# Patient Record
Sex: Female | Born: 1955 | Race: Black or African American | Hispanic: No | Marital: Single | Smoking: Current some day smoker
Health system: Southern US, Community
[De-identification: ages and names within clinical notes are randomized; demographics above are authoritative.]

## PROBLEM LIST (undated history)

## (undated) DIAGNOSIS — Z86010 Personal history of colonic polyps: Secondary | ICD-10-CM

## (undated) DIAGNOSIS — Z9981 Dependence on supplemental oxygen: Secondary | ICD-10-CM

## (undated) DIAGNOSIS — T8859XA Other complications of anesthesia, initial encounter: Secondary | ICD-10-CM

## (undated) DIAGNOSIS — T4145XA Adverse effect of unspecified anesthetic, initial encounter: Secondary | ICD-10-CM

## (undated) DIAGNOSIS — K429 Umbilical hernia without obstruction or gangrene: Secondary | ICD-10-CM

## (undated) DIAGNOSIS — K219 Gastro-esophageal reflux disease without esophagitis: Secondary | ICD-10-CM

## (undated) DIAGNOSIS — E119 Type 2 diabetes mellitus without complications: Secondary | ICD-10-CM

## (undated) DIAGNOSIS — K432 Incisional hernia without obstruction or gangrene: Secondary | ICD-10-CM

## (undated) DIAGNOSIS — J40 Bronchitis, not specified as acute or chronic: Secondary | ICD-10-CM

## (undated) DIAGNOSIS — J849 Interstitial pulmonary disease, unspecified: Secondary | ICD-10-CM

## (undated) DIAGNOSIS — I509 Heart failure, unspecified: Secondary | ICD-10-CM

## (undated) DIAGNOSIS — Z8616 Personal history of COVID-19: Secondary | ICD-10-CM

## (undated) DIAGNOSIS — G4733 Obstructive sleep apnea (adult) (pediatric): Secondary | ICD-10-CM

## (undated) DIAGNOSIS — M2021 Hallux rigidus, right foot: Secondary | ICD-10-CM

## (undated) DIAGNOSIS — J984 Other disorders of lung: Secondary | ICD-10-CM

## (undated) DIAGNOSIS — D649 Anemia, unspecified: Secondary | ICD-10-CM

## (undated) DIAGNOSIS — M1990 Unspecified osteoarthritis, unspecified site: Secondary | ICD-10-CM

## (undated) DIAGNOSIS — F419 Anxiety disorder, unspecified: Secondary | ICD-10-CM

## (undated) HISTORY — DX: Incisional hernia without obstruction or gangrene: K43.2

## (undated) HISTORY — PX: BREAST SURGERY: SHX581

## (undated) HISTORY — PX: HERNIA REPAIR: SHX51

## (undated) HISTORY — PX: FINGER SURGERY: SHX640

## (undated) HISTORY — PX: OTHER SURGICAL HISTORY: SHX169

## (undated) HISTORY — PX: COLON SURGERY: SHX602

## (undated) HISTORY — PX: ABDOMINAL HYSTERECTOMY: SHX81

## (undated) HISTORY — PX: SHOULDER ARTHROSCOPY: SHX128

## (undated) HISTORY — DX: Personal history of colonic polyps: Z86.010

## (undated) HISTORY — DX: Obstructive sleep apnea (adult) (pediatric): G47.33

## (undated) HISTORY — PX: PARTIAL COLECTOMY: SHX5273

## (undated) NOTE — *Deleted (*Deleted)
NAME:  Tiffany Coleman, MRN:  [id number], DOB:  06-05-1956, LOS: 0 ADMISSION DATE:  05/26/2020, CONSULTATION DATE: 10/14 REFERRING MD: Dr. Grandville Silos, CHIEF COMPLAINT:  SOB    Brief History   ***  History of present illness   69 y/o F admitted 10/13 with reports of shortness of breath.   Past Medical History  OSA  Anemia  Arthritis   Significant Hospital Events   ***  Consults:  ***  Procedures:  ***  Significant Diagnostic Tests:  ***  Micro Data:  ***  Antimicrobials:  ***   Interim history/subjective:  ***  Objective   Blood pressure 138/83, pulse (!) 104, temperature 98.1 F (36.7 C), resp. rate 15, height 5' 3.5" (1.613 m), weight 95.3 kg, SpO2 95 %.        Intake/Output Summary (Last 24 hours) at 05/27/2020 1612 Last data filed at 05/27/2020 1779 Gross per 24 hour  Intake -  Output 400 ml  Net -400 ml   Filed Weights   05/26/20 1315  Weight: 95.3 kg    Examination: General:   HEENT: MM pink/moist Neuro:  CV: s1s2 ***, no m/r/g PULM:  *** GI: soft, bsx4 active  Extremities: warm/dry, *** edema  Skin: no rashes or lesions  Resolved Hospital Problem list     Assessment & Plan:     Best practice:  Diet: *** Pain/Anxiety/Delirium protocol (if indicated): *** VAP protocol (if indicated): *** DVT prophylaxis: *** GI prophylaxis: *** Glucose control: *** Mobility: *** Code Status: *** Family Communication: *** Disposition:   Labs   CBC: Recent Labs  Lab 05/26/20 1319 05/27/20 0433  WBC 10.1 8.9  NEUTROABS 7.2  --   HGB 11.0* 10.4*  HCT 35.4* 33.9*  MCV 89.8 90.4  PLT 192 390    Basic Metabolic Panel: Recent Labs  Lab 05/26/20 1319 05/27/20 0433  NA 141 139  K 3.9 4.3  CL 99 102  CO2 30 29  GLUCOSE 119* 111*  BUN 11 14  CREATININE 0.81 0.72  CALCIUM 9.9 9.4   GFR: Estimated Creatinine Clearance: 76.8 mL/min (by C-G formula based on SCr of 0.72 mg/dL). Recent Labs  Lab 05/26/20 1319 05/27/20 0433   WBC 10.1 8.9    Liver Function Tests: No results for input(s): AST, ALT, ALKPHOS, BILITOT, PROT, ALBUMIN in the last 168 hours. No results for input(s): LIPASE, AMYLASE in the last 168 hours. No results for input(s): AMMONIA in the last 168 hours.  ABG    Component Value Date/Time   HCO3 26.4 04/05/2020 1443   TCO2 30 10/12/2016 2114   O2SAT 38.5 04/05/2020 1443     Coagulation Profile: No results for input(s): INR, PROTIME in the last 168 hours.  Cardiac Enzymes: No results for input(s): CKTOTAL, CKMB, CKMBINDEX, TROPONINI in the last 168 hours.  HbA1C: Hgb A1c MFr Bld  Date/Time Value Ref Range Status  04/25/2020 06:21 AM 6.7 (H) 4.8 - 5.6 % Final    Comment:    (NOTE) Pre diabetes:          5.7%-6.4%  Diabetes:              >6.4%  Glycemic control for   <7.0% adults with diabetes   04/25/2019 11:31 AM 5.5 4.8 - 5.6 % Final    Comment:             Prediabetes: 5.7 - 6.4          Diabetes: >6.4  Glycemic control for adults with diabetes: <7.0     CBG: No results for input(s): GLUCAP in the last 168 hours.  Review of Systems: Positives in [place]   Gen: Denies fever, chills, weight change, fatigue, night sweats HEENT: Denies blurred vision, double vision, hearing loss, tinnitus, sinus congestion, rhinorrhea, sore throat, neck stiffness, dysphagia PULM: Denies shortness of breath, cough, sputum production, hemoptysis, wheezing CV: Denies chest pain, edema, orthopnea, paroxysmal nocturnal dyspnea, palpitations GI: Denies abdominal pain, nausea, vomiting, diarrhea, hematochezia, melena, constipation, change in bowel habits GU: Denies dysuria, hematuria, polyuria, oliguria, urethral discharge Endocrine: Denies hot or cold intolerance, polyuria, polyphagia or appetite change Derm: Denies rash, dry skin, scaling or peeling skin change Heme: Denies easy bruising, bleeding, bleeding gums Neuro: Denies headache, numbness, weakness, slurred speech, loss of memory  or consciousness   Past Medical History  She,  has a past medical history of Anemia (yrs ago), Arthritis, Bronchitis, Complication of anesthesia, colonic polyps (2015), Obstructive sleep apnea (06/02/2016), Recurrent ventral hernia (5/32/9924), and Umbilical hernia.   Surgical History    Past Surgical History:  Procedure Laterality Date  . ABDOMINAL HYSTERECTOMY     partial  . BREAST SURGERY Bilateral    cyst removed  . COLON SURGERY    . COLONOSCOPY WITH PROPOFOL N/A 05/29/2017   Procedure: COLONOSCOPY WITH PROPOFOL;  Surgeon: Wilford Corner, MD;  Location: [facility];  Service: Endoscopy;  Laterality: N/A;  . FINGER SURGERY     right pinkie x 3  . HERNIA REPAIR    . KNEE SURGERY Right 2017  . PARTIAL COLECTOMY Left ~2004   for obstruction  . right foot surgery  yrs ago  . stomach tumur     [id number] benign     Social History   reports that she has been smoking cigarettes. She has smoked for the past 20.00 years. She has never used smokeless tobacco. She reports current alcohol use. She reports that she does not use drugs.   Family History   Her family history includes Cancer in her father and maternal uncle; Deep vein thrombosis in her mother; Diabetes in her mother; Heart attack in her son; Hyperlipidemia in her brother; Hypertension in her daughter, mother, and sister; Varicose Veins in her mother.   Allergies Allergies  Allergen Reactions  . Latex Hives and Itching  . Penicillins Hives, Itching and Other (See Comments)    Has patient had a PCN reaction causing immediate rash, facial/tongue/throat swelling, SOB or lightheadedness with hypotension: Yes Has patient had a PCN reaction causing severe rash involving mucus membranes or skin necrosis: Unk Has patient had a PCN reaction that required hospitalization: Unk Has patient had a PCN reaction occurring within the last 10 years: No If all of the above answers are "NO", then may proceed with Cephalosporin use.    . Tape Hives, Itching and Other (See Comments)    Coban wrap, in particular  . Codeine Itching     Home Medications  Prior to Admission medications   Medication Sig Start Date End Date Taking? Authorizing Provider  acetaminophen (TYLENOL) 325 MG tablet Take 2 tablets (650 mg total) by mouth every 6 (six) hours as needed for mild pain or headache (fever >/= 101). 05/11/20  Yes Arrien, Jimmy Picket, MD  albuterol (PROVENTIL HFA;VENTOLIN HFA) 108 (90 Base) MCG/ACT inhaler Inhale 2 puffs into the lungs every 6 (six) hours as needed for wheezing or shortness of breath. 01/19/17  Yes Minus Liberty, MD  gabapentin (NEURONTIN) 300 MG capsule Take  300 mg by mouth 3 (three) times daily as needed (nerve pain).  07/17/19  Yes [provider]  guaiFENesin-dextromethorphan (ROBITUSSIN DM) 100-10 MG/5ML syrup Take 5 mLs by mouth every 6 (six) hours as needed for cough. 05/11/20  Yes Arrien, Jimmy Picket, MD  Nutritional Supplements (FEEDING SUPPLEMENT, KATE FARMS STANDARD 1.4,) LIQD liquid Take 325 mLs by mouth daily. 05/11/20 06/10/20 Yes Arrien, Jimmy Picket, MD  predniSONE (DELTASONE) 10 MG tablet Take 2 tablets daily for one week, then take 1 tablet daily for one week, then take half tablet daily for one week. 05/11/20  Yes Arrien, Jimmy Picket, MD  zolpidem (AMBIEN) 10 MG tablet Take 10 mg by mouth at bedtime as needed for sleep.  07/18/19  Yes [provider]  albuterol (PROVENTIL) (2.5 MG/3ML) 0.083% nebulizer solution Take 3 mLs (2.5 mg total) by nebulization every 6 (six) hours as needed for wheezing or shortness of breath. Patient not taking: Reported on 05/26/2020 02/08/19   Ok Edwards, PA-C  budesonide-formoterol [facility]) 160-4.5 MCG/ACT inhaler Inhale 2 puffs into the lungs 2 (two) times daily. Patient not taking: Reported on 05/26/2020    [provider]     Critical care time: ***    Noe Gens, MSN, NP-C, AGACNP-BC [facility]  05/27/2020, 4:12 PM   Please see [email or web address] for pager details.

---

## 2009-02-26 ENCOUNTER — Emergency Department (HOSPITAL_COMMUNITY): Admission: EM | Admit: 2009-02-26 | Discharge: 2009-02-26 | Payer: Self-pay | Admitting: Emergency Medicine

## 2010-07-22 ENCOUNTER — Encounter
Admission: RE | Admit: 2010-07-22 | Discharge: 2010-07-22 | Payer: Self-pay | Source: Home / Self Care | Attending: Surgery | Admitting: Surgery

## 2010-09-19 ENCOUNTER — Encounter (HOSPITAL_COMMUNITY): Payer: Medicaid Other | Attending: Surgery

## 2010-09-19 LAB — SURGICAL PCR SCREEN
MRSA, PCR: NEGATIVE
Staphylococcus aureus: POSITIVE — AB

## 2010-09-27 ENCOUNTER — Ambulatory Visit (HOSPITAL_COMMUNITY)
Admission: RE | Admit: 2010-09-27 | Discharge: 2010-09-27 | Disposition: A | Discharge status: Home / Self Care | Payer: Medicaid Other | Source: Ambulatory Visit | Attending: Surgery | Admitting: Surgery

## 2010-09-27 ENCOUNTER — Inpatient Hospital Stay (HOSPITAL_COMMUNITY)
Admission: RE | Admit: 2010-09-27 | Discharge: 2010-10-04 | DRG: 336 | Disposition: A | Discharge status: Home / Self Care | Payer: Medicaid Other | Attending: Surgery | Admitting: Surgery

## 2010-09-27 DIAGNOSIS — K430 Incisional hernia with obstruction, without gangrene: Principal | ICD-10-CM | POA: Diagnosis present

## 2010-09-27 DIAGNOSIS — E663 Overweight: Secondary | ICD-10-CM | POA: Diagnosis present

## 2010-09-27 DIAGNOSIS — K921 Melena: Secondary | ICD-10-CM | POA: Diagnosis not present

## 2010-09-27 DIAGNOSIS — K219 Gastro-esophageal reflux disease without esophagitis: Secondary | ICD-10-CM | POA: Diagnosis present

## 2010-09-27 DIAGNOSIS — K560 Paralytic ileus: Secondary | ICD-10-CM | POA: Diagnosis not present

## 2010-09-27 DIAGNOSIS — F17200 Nicotine dependence, unspecified, uncomplicated: Secondary | ICD-10-CM | POA: Diagnosis present

## 2010-09-27 LAB — POCT I-STAT, CHEM 8
BUN: 13 mg/dL (ref 6–23)
Calcium, Ion: 1.29 mmol/L (ref 1.12–1.32)
Hemoglobin: 13.6 g/dL (ref 12.0–15.0)
Sodium: 139 mEq/L (ref 135–145)
TCO2: 28 mmol/L (ref 0–100)

## 2010-09-28 LAB — CBC
MCH: 27 pg (ref 26.0–34.0)
MCHC: 31.6 g/dL (ref 30.0–36.0)
Platelets: 221 10*3/uL (ref 150–400)
RBC: 4.07 MIL/uL (ref 3.87–5.11)

## 2010-09-28 LAB — BASIC METABOLIC PANEL
CO2: 29 mEq/L (ref 19–32)
Calcium: 9.1 mg/dL (ref 8.4–10.5)
Creatinine, Ser: 0.83 mg/dL (ref 0.4–1.2)
GFR calc Af Amer: >60 mL/min (ref >60)
GFR calc non Af Amer: >60 mL/min (ref >60)

## 2010-09-28 NOTE — Op Note (Signed)
Tiffany Coleman, Tiffany Coleman           ACCOUNT NO.:  [id number]  MEDICAL RECORD NO.:  [id number]           PATIENT TYPE:  O  LOCATION:  XRAY                         FACILITY:  [facility]  PHYSICIAN:  Imogene Burn. Georgette Dover, M.D. DATE OF BIRTH:  1955/10/16  DATE OF PROCEDURE:  09/27/2010 DATE OF DISCHARGE:                              OPERATIVE REPORT   PREOPERATIVE DIAGNOSIS:  Recurrent ventral hernia.  POSTOPERATIVE DIAGNOSIS:  Recurrent ventral hernia.  PROCEDURE PERFORMED:  Exploratory laparotomy with extensive lysis of adhesions for greater than 90 minutes and ventral hernia repair with mesh.  SURGEON:  Imogene Burn. Georgette Dover, M.D.  ASSISTANT:  Orson Ape. Rise Patience, M.D.  ANESTHESIA:  General  INDICATIONS:  This is a 55 year old female with extensive past surgical history of which we have no available medical records.  She has had multiple ventral hernias repaired in the past.  She thinks that she has mesh in place but all of our attempts to try to obtain past operative reports have been unsuccessful.  She continues to smoke.  She is overweight.  She has developed a recurrent ventral hernia and has developed signs of obstruction intermittently.  A CT scan confirms that she has a large ventral hernia above her umbilicus with some incarcerated small bowel and colon.  She presents now for surgical repair.  DESCRIPTION OF PROCEDURE:  The patient was brought to the operating room, placed in the supine position on the operating table.  After an adequate level of general anesthesia was obtained, a Foley catheter was placed under sterile technique.  The patient's abdomen was prepped with ChloraPrep and draped in sterile fashion.  Time-out was taken to assure proper patient and proper procedure.  We opened her upper midline incision down to the umbilicus.  Dissection was carried down the subcutaneous tissues with cautery.  We dissected down to the hernia sac. We bluntly dissected into the hernia  sac.  We then began slowly peeling away some adhesions from the hernia sac.  As we continued dissecting this away bluntly, we were able to open the hernia sac wider.  We identified a fairly thin layer of fascia and began to open this as well. The entire hernia defect seems to be about 10 cm.  As we further opened the abdominal wall, there were a few other small Swiss cheese defects. There was some small bowel that was densely adherent to the abdominal wall in the left lower quadrant.  We took this down with scissors.  We then repaired the small serosal defect with some interrupted 3-0 silks. We continued dissecting for about 90 minutes and cleared the entire abdominal wall.  We excised a little bit of the fascia with the Swiss cheese defects.  The fascial edges seemed to come together with a only a moderate amount of tension.  We irrigated the abdomen thoroughly.  We then performed ventral hernia repair with a 20 x 25 cm sheet of Physio mesh.  We placed this in underlay position and suspended this from the abdominal wall with multiple #1 and 0 Novofil sutures.  These were all tied up to the fascia and the mesh was held in place.  There were no defects between the sutures.  We then reapproximated the fascia with figure-of-eight #1 Prolene sutures.  We then thoroughly irrigated the subcutaneous tissues.  A 19 Blake drain was placed and sutured with a 2-0 nylon.  The subcutaneous tissues were irrigated.  Staples were used to close the skin.  An occlusive dressing was applied.  The patient was then extubated and brought to recovery room in stable condition.  All sponge, instrument and needle counts were correct.     Imogene Burn. Georgette Dover, M.D.     MKT/MEDQ  D:  09/27/2010  T:  09/27/2010  Job:  [id number]  Electronically Signed by Donnie Mesa M.D. on 09/28/2010 08:16:52 AM

## 2010-09-30 LAB — BASIC METABOLIC PANEL
Chloride: 105 mEq/L (ref 96–112)
GFR calc non Af Amer: >60 mL/min (ref >60)
Potassium: 4.1 mEq/L (ref 3.5–5.1)
Sodium: 138 mEq/L (ref 135–145)

## 2010-09-30 LAB — CBC
HCT: 32.1 % — ABNORMAL LOW (ref 36.0–46.0)
Platelets: 185 10*3/uL (ref 150–400)
RBC: 3.65 MIL/uL — ABNORMAL LOW (ref 3.87–5.11)
RDW: 13.5 % (ref 11.5–15.5)
WBC: 5.9 10*3/uL (ref 4.0–10.5)

## 2010-10-03 DIAGNOSIS — M7989 Other specified soft tissue disorders: Secondary | ICD-10-CM

## 2010-10-03 LAB — CBC
HCT: 30.7 % — ABNORMAL LOW (ref 36.0–46.0)
Hemoglobin: 9.7 g/dL — ABNORMAL LOW (ref 12.0–15.0)
RBC: 3.57 MIL/uL — ABNORMAL LOW (ref 3.87–5.11)
WBC: 5.9 10*3/uL (ref 4.0–10.5)

## 2010-10-07 NOTE — Discharge Summary (Signed)
  NAMEAMANY, RANDO           ACCOUNT NO.:  [id number]  MEDICAL RECORD NO.:  [id number]           PATIENT TYPE:  I  LOCATION:  1696                         [facility]ACILITY[facility]  PHYSICIAN:  Imogene Burn. Georgette Dover, M.D. DATE OF BIRTH:  Jun 27, 1956  DATE OF ADMISSION:  09/27/2010 DATE OF DISCHARGE:                              DISCHARGE SUMMARY   ADMISSION DIAGNOSIS:  Recurrent ventral hernia with incarcerated small bowel and colon.  DISCHARGE DIAGNOSIS:  Recurrent ventral hernia with incarcerated small bowel and colon.  PROCEDURE:  Exploratory laparotomy with extensive lysis of adhesions and ventral hernia repair with mesh.  BRIEF HISTORY:  This is a 55 year old female with an extensive past surgical history performed in [place], who has had previous ventral hernia repairs.  She continues to smoke and is overweight.  She has developed a recurrent ventral hernia.  CT scan shows that she has incarcerated small bowel and colon.  She does have intermittent signs of obstruction.  All our attempts to obtain past medical records have been unsuccessful.  She presents now for repair.  HOSPITAL COURSE:  The patient underwent extensive lysis of adhesions and a ventral hernia repair with underlay Physiomesh for a hernia defect, measuring about 10 cm.  We did not encounter any old permanent mesh, but there was a light scar tissue that may represent old absorbable mesh which is no longer present.  A drain was placed in the subcutaneous space.  Postoperatively, the patient progressed fairly slowly.  She did have a postoperative ileus which is not unexpected.  She was given some Dulcolax suppositories.  She had a couple of bowel movements that had a little bit of gross blood.  There was no pain associated with these. The patient denies any previous colonoscopy.  Her hemoglobin level has remained stable.  Vital signs are normal.  She has slowly progressed to a regular diet and was  transitioned from PCA to oral pain medications. On the day of discharge, she is ambulating well.  She is using elastic abdominal binder for comfort.  Her drain is removed today, as was putting out minimal amounts of serous fluid.  Her incision looks good.  DISCHARGE INSTRUCTIONS:  She is given Percocet p.r.n. for pain.  She has also used Dulcolax suppositories as needed.  Followup appointment in 1 week for staple removal.  My office will call her with the appointment time.  She will likely need a colonoscopy as an outpatient to workup this hematochezia.     Imogene Burn. Georgette Dover, M.D.     MKT/MEDQ  D:  10/04/2010  T:  10/04/2010  Job:  [id number]  cc:   Nolene Ebbs, M.D. Fax: [phone]  Electronically Signed by Donnie Mesa M.D. on 10/07/2010 09:04:54 AM

## 2010-12-05 ENCOUNTER — Other Ambulatory Visit: Payer: Self-pay | Admitting: Internal Medicine

## 2011-02-08 ENCOUNTER — Other Ambulatory Visit: Payer: Self-pay | Admitting: Internal Medicine

## 2011-02-08 DIAGNOSIS — Z1231 Encounter for screening mammogram for malignant neoplasm of breast: Secondary | ICD-10-CM

## 2011-03-03 ENCOUNTER — Ambulatory Visit (HOSPITAL_BASED_OUTPATIENT_CLINIC_OR_DEPARTMENT_OTHER): Payer: Medicaid Other | Attending: Internal Medicine

## 2011-03-03 DIAGNOSIS — G4733 Obstructive sleep apnea (adult) (pediatric): Secondary | ICD-10-CM | POA: Insufficient documentation

## 2011-03-11 DIAGNOSIS — G4733 Obstructive sleep apnea (adult) (pediatric): Secondary | ICD-10-CM

## 2011-03-11 NOTE — Procedures (Signed)
Tiffany Coleman, Tiffany Coleman           ACCOUNT NO.:  [id number]  MEDICAL RECORD NO.:  [id number]          PATIENT TYPE:  OUT  LOCATION:  SLEEP CENTER                 [facility]ACILITY[facility]  PHYSICIAN:  Clinton D. Annamaria Boots, MD, FCCP, FACPDATE OF BIRTH:  03-12-1956  DATE OF STUDY:  03/03/2011                           NOCTURNAL POLYSOMNOGRAM  REFERRING PHYSICIAN:  EDWIN A AVBUERE  REFERRING PHYSICIAN:  Nolene Ebbs, MD.  IDENTIFICATION FOR STUDY:  Hypersomnia with sleep apnea.  EPWORTH SLEEPINESS SCORE:  4/24.  BMI 45.5.  Weight 257 pounds, height 63 inches.  Neck 14 inches.  HOME MEDICATIONS:  Charted and reviewed.  SLEEP ARCHITECTURE:  Total sleep time 128 minutes with sleep efficiency 33.4%.  Stage I was 17.2%, stage II 60.9%, stage III absent, REM 21.9% of total sleep time.  Sleep latency 230.5 minutes, REM latency 117.5 minutes, awake after sleep onset 19.5 minutes, arousal index 11.7.  BEDTIME MEDICATION:  None.  SLEEP ARCHITECTURE:  Significant for inability to attain sleep until approximately 2:15 a.m. with normally maintained sleep thereafter.  RESPIRATORY DATA:  Apnea/hypopnea index (AHI) 44.1 per hour.  A total of 94 events was scored including 40 obstructive apneas and 54 hypopneas. Most events were associated with supine sleep position.  REM AHI of zero.  Sleep onset was too late to permit application of CPAP titration by split protocol on the study night.  OXYGEN DATA:  Moderate snoring with oxygen desaturation to a nadir of 80% and a mean oxygen saturation through the study of 92.8% on room air.  CARDIAC DATA:  Sinus rhythm with occasional PVC.  MOVEMENT/PARASOMNIA:  No significant movement disturbance.  Bathroom x1.  IMPRESSION/RECOMMENDATIONS: 1. Sleep architecture marked by difficulty initiating sleep and final     sleep onset without medication at approximately 2:15 a.m. 2. Severe obstructive sleep apnea/hypopnea syndrome, AHI 44.1 per     hour.  Most events were  associated with supine sleep position.     Moderate snoring with oxygen desaturation to a nadir of 80% and a     mean oxygen saturation through the study of 92.8% on room air. 3. Delayed sleep onset prevented application of CPAP titration     protocol.  Consider return for dedicated CPAP titration study.  If     this is done, consider providing a sleep medication for the patient     to take if necessary during the CPAP titration.     Clinton D. Annamaria Boots, MD, [facility], FACP Diplomate, Tax adviser of Sleep Medicine Electronically Signed    CDY/MEDQ  D:  03/11/2011 17:03:57  T:  03/11/2011 18:51:58  Job:  [id number]

## 2011-03-29 ENCOUNTER — Ambulatory Visit
Admission: RE | Admit: 2011-03-29 | Discharge: 2011-03-29 | Disposition: A | Discharge status: Home / Self Care | Payer: Medicaid Other | Source: Ambulatory Visit | Attending: Internal Medicine | Admitting: Internal Medicine

## 2011-03-29 DIAGNOSIS — Z1231 Encounter for screening mammogram for malignant neoplasm of breast: Secondary | ICD-10-CM

## 2011-03-31 ENCOUNTER — Other Ambulatory Visit: Payer: Self-pay | Admitting: Internal Medicine

## 2011-03-31 DIAGNOSIS — R928 Other abnormal and inconclusive findings on diagnostic imaging of breast: Secondary | ICD-10-CM

## 2011-04-07 ENCOUNTER — Ambulatory Visit
Admission: RE | Admit: 2011-04-07 | Discharge: 2011-04-07 | Disposition: A | Discharge status: Home / Self Care | Payer: Medicaid Other | Source: Ambulatory Visit | Attending: Internal Medicine | Admitting: Internal Medicine

## 2011-04-07 DIAGNOSIS — R928 Other abnormal and inconclusive findings on diagnostic imaging of breast: Secondary | ICD-10-CM

## 2011-05-12 ENCOUNTER — Encounter (HOSPITAL_BASED_OUTPATIENT_CLINIC_OR_DEPARTMENT_OTHER): Payer: Medicaid Other

## 2011-06-09 ENCOUNTER — Encounter (HOSPITAL_BASED_OUTPATIENT_CLINIC_OR_DEPARTMENT_OTHER): Payer: Medicaid Other

## 2011-08-18 ENCOUNTER — Encounter (HOSPITAL_COMMUNITY): Payer: Self-pay | Admitting: *Deleted

## 2011-08-18 ENCOUNTER — Emergency Department (HOSPITAL_COMMUNITY)
Admission: EM | Admit: 2011-08-18 | Discharge: 2011-08-19 | Discharge status: Against Medical Advice | Payer: Medicaid Other | Attending: Emergency Medicine | Admitting: Emergency Medicine

## 2011-08-18 DIAGNOSIS — Z0389 Encounter for observation for other suspected diseases and conditions ruled out: Secondary | ICD-10-CM | POA: Insufficient documentation

## 2011-08-18 NOTE — ED Notes (Signed)
Lt. Side of head increasingly painful, now pain going down back of neck and shoulder. Been taking ibuprofen and sinus pills. Been congested.

## 2011-11-10 ENCOUNTER — Encounter (HOSPITAL_COMMUNITY): Payer: Self-pay | Admitting: *Deleted

## 2011-11-10 ENCOUNTER — Emergency Department (HOSPITAL_COMMUNITY): Payer: Medicaid Other

## 2011-11-10 ENCOUNTER — Other Ambulatory Visit: Payer: Self-pay

## 2011-11-10 ENCOUNTER — Emergency Department (HOSPITAL_COMMUNITY)
Admission: EM | Admit: 2011-11-10 | Discharge: 2011-11-10 | Disposition: A | Discharge status: Home / Self Care | Payer: Medicaid Other | Attending: Emergency Medicine | Admitting: Emergency Medicine

## 2011-11-10 DIAGNOSIS — R062 Wheezing: Secondary | ICD-10-CM | POA: Insufficient documentation

## 2011-11-10 DIAGNOSIS — R079 Chest pain, unspecified: Secondary | ICD-10-CM | POA: Insufficient documentation

## 2011-11-10 DIAGNOSIS — R0602 Shortness of breath: Secondary | ICD-10-CM | POA: Insufficient documentation

## 2011-11-10 DIAGNOSIS — J069 Acute upper respiratory infection, unspecified: Secondary | ICD-10-CM | POA: Insufficient documentation

## 2011-11-10 MED ORDER — PREDNISONE 20 MG PO TABS
ORAL_TABLET | ORAL | Status: AC
  Filled 2011-11-10: qty 3

## 2011-11-10 MED ORDER — ALBUTEROL SULFATE (5 MG/ML) 0.5% IN NEBU
5.0000 mg | INHALATION_SOLUTION | Freq: Once | RESPIRATORY_TRACT | Status: AC
  Administered 2011-11-10: 5 mg via RESPIRATORY_TRACT
  Filled 2011-11-10: qty 1

## 2011-11-10 MED ORDER — ACETAMINOPHEN 325 MG PO TABS
650.0000 mg | ORAL_TABLET | Freq: Once | ORAL | Status: AC
  Administered 2011-11-10: 650 mg via ORAL
  Filled 2011-11-10: qty 2

## 2011-11-10 MED ORDER — PREDNISONE 20 MG PO TABS
60.0000 mg | ORAL_TABLET | Freq: Once | ORAL | Status: AC
  Administered 2011-11-10: 60 mg via ORAL
  Filled 2011-11-10: qty 3

## 2011-11-10 MED ORDER — PREDNISONE 20 MG PO TABS: 60.0000 mg | ORAL_TABLET | Freq: Every day | ORAL | Status: AC

## 2011-11-10 MED ORDER — HYDROCODONE-ACETAMINOPHEN 5-325 MG PO TABS: 1.0000 | ORAL_TABLET | ORAL | Status: AC | PRN

## 2011-11-10 NOTE — ED Notes (Signed)
Pt has had a cough (dry) and cold for a week.  Pt reports wheezing and chest pain with this.  No distress noted in triage

## 2011-11-10 NOTE — ED Notes (Signed)
Pt completed nebulizer treatment. States she feels much better. Will continue to monitor. No wheezing heard at the time.

## 2011-11-10 NOTE — Discharge Instructions (Signed)
Antibiotic Nonuse  Your caregiver felt that the infection or problem was not one that would be helped with an antibiotic. Infections may be caused by viruses or bacteria. Only a caregiver can tell which one of these is the likely cause of an illness. A cold is the most common cause of infection in both adults and children. A cold is a virus. Antibiotic treatment will have no effect on a viral infection. Viruses can lead to many lost days of work caring for sick children and many missed days of school. Children may catch as many as 10 "colds" or "flus" per year during which they can be tearful, cranky, and uncomfortable. The goal of treating a virus is aimed at keeping the ill person comfortable. Antibiotics are medications used to help the body fight bacterial infections. There are relatively few types of bacteria that cause infections but there are hundreds of viruses. While both viruses and bacteria cause infection they are very different types of germs. A viral infection will typically go away by itself within 7 to 10 days. Bacterial infections may spread or get worse without antibiotic treatment. Examples of bacterial infections are:  Sore throats (like strep throat or tonsillitis).   Infection in the lung (pneumonia).   Ear and skin infections.  Examples of viral infections are:  Colds or flus.   Most coughs and bronchitis.   Sore throats not caused by Strep.   Runny noses.  It is often best not to take an antibiotic when a viral infection is the cause of the problem. Antibiotics can kill off the helpful bacteria that we have inside our body and allow harmful bacteria to start growing. Antibiotics can cause side effects such as allergies, nausea, and diarrhea without helping to improve the symptoms of the viral infection. Additionally, repeated uses of antibiotics can cause bacteria inside of our body to become resistant. That resistance can be passed onto harmful bacterial. The next time  you have an infection it may be harder to treat if antibiotics are used when they are not needed. Not treating with antibiotics allows our own immune system to develop and take care of infections more efficiently. Also, antibiotics will work better for [place] when they are prescribed for bacterial infections. Treatments for a child that is ill may include:  Give extra fluids throughout the day to stay hydrated.   Get plenty of rest.   Only give your child over-the-counter or prescription medicines for pain, discomfort, or fever as directed by your caregiver.   The use of a cool mist humidifier may help stuffy noses.   Cold medications if suggested by your caregiver.  Your caregiver may decide to start you on an antibiotic if:  The problem you were seen for today continues for a longer length of time than expected.   You develop a secondary bacterial infection.  SEEK MEDICAL CARE IF:  Fever lasts longer than 5 days.   Symptoms continue to get worse after 5 to 7 days or become severe.   Difficulty in breathing develops.   Signs of dehydration develop (poor drinking, rare urinating, dark colored urine).   Changes in behavior or worsening tiredness (listlessness or lethargy).  Document Released: 10/09/2001 Document Revised: 07/20/2011 Document Reviewed: 04/07/2009 [facility] Patient Information 2012 [place].Upper Respiratory Infection, Adult An upper respiratory infection (URI) is also sometimes known as the common cold. The upper respiratory tract includes the nose, sinuses, throat, trachea, and bronchi. Bronchi are the airways leading to the lungs. Most  people improve within 1 week, but symptoms can last up to 2 weeks. A residual cough may last even longer.  CAUSES Many different viruses can infect the tissues lining the upper respiratory tract. The tissues become irritated and inflamed and often become very moist. Mucus production is also common. A cold is contagious. You can easily  spread the virus to others by oral contact. This includes kissing, sharing a glass, coughing, or sneezing. Touching your mouth or nose and then touching a surface, which is then touched by another person, can also spread the virus. SYMPTOMS  Symptoms typically develop 1 to 3 days after you come in contact with a cold virus. Symptoms vary from person to person. They may include:  Runny nose.   Sneezing.   Nasal congestion.   Sinus irritation.   Sore throat.   Loss of voice (laryngitis).   Cough.   Fatigue.   Muscle aches.   Loss of appetite.   Headache.   Low-grade fever.  DIAGNOSIS  You might diagnose your own cold based on familiar symptoms, since most people get a cold 2 to 3 times a year. Your caregiver can confirm this based on your exam. Most importantly, your caregiver can check that your symptoms are not due to another disease such as strep throat, sinusitis, pneumonia, asthma, or epiglottitis. Blood tests, throat tests, and X-rays are not necessary to diagnose a common cold, but they may sometimes be helpful in excluding other more serious diseases. Your caregiver will decide if any further tests are required. RISKS AND COMPLICATIONS  You may be at risk for a more severe case of the common cold if you smoke cigarettes, have chronic heart disease (such as heart failure) or lung disease (such as asthma), or if you have a weakened immune system. The very young and very old are also at risk for more serious infections. Bacterial sinusitis, middle ear infections, and bacterial pneumonia can complicate the common cold. The common cold can worsen asthma and chronic obstructive pulmonary disease (COPD). Sometimes, these complications can require emergency medical care and may be life-threatening. PREVENTION  The best way to protect against getting a cold is to practice good hygiene. Avoid oral or hand contact with people with cold symptoms. Wash your hands often if contact occurs.  There is no clear evidence that vitamin C, vitamin E, echinacea, or exercise reduces the chance of developing a cold. However, it is always recommended to get plenty of rest and practice good nutrition. TREATMENT  Treatment is directed at relieving symptoms. There is no cure. Antibiotics are not effective, because the infection is caused by a virus, not by bacteria. Treatment may include:  Increased fluid intake. Sports drinks offer valuable electrolytes, sugars, and fluids.   Breathing heated mist or steam (vaporizer or shower).   Eating chicken soup or other clear broths, and maintaining good nutrition.   Getting plenty of rest.   Using gargles or lozenges for comfort.   Controlling fevers with ibuprofen or acetaminophen as directed by your caregiver.   Increasing usage of your inhaler if you have asthma.  Zinc gel and zinc lozenges, taken in the first 24 hours of the common cold, can shorten the duration and lessen the severity of symptoms. Pain medicines may help with fever, muscle aches, and throat pain. A variety of non-prescription medicines are available to treat congestion and runny nose. Your caregiver can make recommendations and may suggest nasal or lung inhalers for other symptoms.  HOME CARE INSTRUCTIONS  Only take over-the-counter or prescription medicines for pain, discomfort, or fever as directed by your caregiver.   Use a warm mist humidifier or inhale steam from a shower to increase air moisture. This may keep secretions moist and make it easier to breathe.   Drink enough water and fluids to keep your urine clear or pale yellow.   Rest as needed.   Return to work when your temperature has returned to normal or as your caregiver advises. You may need to stay home longer to avoid infecting others. You can also use a face mask and careful hand washing to prevent spread of the virus.  SEEK MEDICAL CARE IF:   After the first few days, you feel you are getting worse  rather than better.   You need your caregiver's advice about medicines to control symptoms.   You develop chills, worsening shortness of breath, or brown or red sputum. These may be signs of pneumonia.   You develop yellow or brown nasal discharge or pain in the face, especially when you bend forward. These may be signs of sinusitis.   You develop a fever, swollen neck glands, pain with swallowing, or white areas in the back of your throat. These may be signs of strep throat.  SEEK IMMEDIATE MEDICAL CARE IF:   You have a fever.   You develop severe or persistent headache, ear pain, sinus pain, or chest pain.   You develop wheezing, a prolonged cough, cough up blood, or have a change in your usual mucus (if you have chronic lung disease).   You develop sore muscles or a stiff neck.  Document Released: 01/24/2001 Document Revised: 07/20/2011 Document Reviewed: 12/02/2010 [facility] Patient Information 2012 [place].

## 2011-11-10 NOTE — ED Notes (Signed)
Pt discharged home. Had no further questions.

## 2011-11-10 NOTE — ED Notes (Signed)
Pt presents to department for evaluation of dry cough, chest soreness and headache. Ongoing x1 week. 4/10 headache at the time. Lung sounds clear and equal bilaterally. Respirations unlabored. Denies fever. She is alert and oriented x4. No signs of distress noted at the present.

## 2011-11-10 NOTE — ED Provider Notes (Signed)
History     CSN: [id number]  Arrival date & time 11/10/11  1831   First MD Initiated Contact with Patient 11/10/11 2126      Chief Complaint  Patient presents with  . URI    (Consider location/radiation/quality/duration/timing/severity/associated sxs/prior treatment) Patient is a 56 y.o. female presenting with URI. The history is provided by the patient.  URI The primary symptoms include headaches, cough, wheezing and myalgias. Primary symptoms do not include abdominal pain, nausea, vomiting or rash.  The headache is not associated with eye pain, neck stiffness or weakness.  The myalgias are not associated with weakness.   patient has had a cough for the last week or 2. She states she bring up some sputum but then vomited up. She states she always gets hot and cold she doesn't know if she's had a fever. She states to have wheezing and chest pain. She said it's been no relief with her inhaler. She states that no relief with ibuprofen. She states her inhaler does not work for her. No abdominal pain. She states the back of her head in the shoulder will hurt. It hurts worse with coughing or movement. Said she smokes about one cigarette a day.  History reviewed. No pertinent past medical history.  Past Surgical History  Procedure Date  . Hernia repair     No family history on file.  History  Substance Use Topics  . Smoking status: Current Some Day Smoker  . Smokeless tobacco: Not on file  . Alcohol Use: Yes    OB History    Grav Para Term Preterm Abortions TAB SAB Ect Mult Living                  Review of Systems  Constitutional: Negative for activity change and appetite change.  HENT: Negative for neck stiffness.   Eyes: Negative for pain.  Respiratory: Positive for cough, shortness of breath and wheezing. Negative for chest tightness.   Cardiovascular: Positive for chest pain. Negative for leg swelling.  Gastrointestinal: Negative for nausea, vomiting, abdominal pain  and diarrhea.  Genitourinary: Negative for flank pain.  Musculoskeletal: Positive for myalgias. Negative for back pain.  Skin: Negative for rash.  Neurological: Positive for headaches. Negative for weakness and numbness.  Psychiatric/Behavioral: Negative for behavioral problems.    Allergies  Latex  Home Medications   Current Outpatient Rx  Name Route Sig Dispense Refill  . IBUPROFEN 200 MG PO TABS Oral Take 400 mg by mouth every 6 (six) hours as needed. For pain     . HYDROCODONE-ACETAMINOPHEN 5-325 MG PO TABS Oral Take 1 tablet by mouth every 4 (four) hours as needed for pain (cough). 15 tablet 0  . PREDNISONE 20 MG PO TABS Oral Take 3 tablets (60 mg total) by mouth daily. 4 tablet 0    BP 144/76  Pulse 97  Temp(Src) 98.4 F (36.9 C) (Oral)  Resp 18  SpO2 100%  Physical Exam  Nursing note and vitals reviewed. Constitutional: She is oriented to person, place, and time. She appears well-developed and well-nourished.  HENT:  Head: Normocephalic and atraumatic.  Eyes: EOM are normal. Pupils are equal, round, and reactive to light.  Neck: Normal range of motion. Neck supple.  Cardiovascular: Normal rate, regular rhythm and normal heart sounds.   No murmur heard. Pulmonary/Chest: Effort normal. No respiratory distress. She has wheezes. She has no rales. She exhibits tenderness.       Tenderness over bilateral upper chest. Diffuse wheezes and prolonged  expirations  Abdominal: Soft. Bowel sounds are normal. She exhibits no distension. There is no tenderness. There is no rebound and no guarding.  Musculoskeletal: Normal range of motion.       Tenderness over left trapezius  Neurological: She is alert and oriented to person, place, and time. No cranial nerve deficit.  Skin: Skin is warm and dry.  Psychiatric: She has a normal mood and affect. Her speech is normal.    ED Course  Procedures (including critical care time)  Labs Reviewed - No data to display Dg Chest 2  View  11/10/2011  *RADIOLOGY REPORT*  Clinical Data: Upper respiratory tract infection  CHEST - 2 VIEW  Comparison: 09/27/2010  Findings: The heart size and mediastinal contours are within normal limits.  Both lungs are clear.  The visualized skeletal structures are unremarkable.  IMPRESSION: Negative exam.  Original Report Authenticated By: Angelita Ingles, M.D.     1. URI (upper respiratory infection)       MDM  Patient with cough for a week or 2. Some chest aching chest pain with it. Wheezing on exam and negative x-ray. Likely URI. She also has a smoking history. Patient given pain medicine and steroid. She be discharged home to followup as needed        Jasper Riling. Alvino Chapel, MD 11/10/11 2255

## 2012-02-08 ENCOUNTER — Other Ambulatory Visit: Payer: Self-pay | Admitting: Internal Medicine

## 2012-02-08 DIAGNOSIS — Z1231 Encounter for screening mammogram for malignant neoplasm of breast: Secondary | ICD-10-CM

## 2012-02-13 ENCOUNTER — Ambulatory Visit
Admission: RE | Admit: 2012-02-13 | Discharge: 2012-02-13 | Disposition: A | Discharge status: Home / Self Care | Payer: Medicaid Other | Source: Ambulatory Visit | Attending: Internal Medicine | Admitting: Internal Medicine

## 2012-02-13 DIAGNOSIS — Z1231 Encounter for screening mammogram for malignant neoplasm of breast: Secondary | ICD-10-CM

## 2012-10-02 ENCOUNTER — Emergency Department (HOSPITAL_COMMUNITY): Payer: Medicaid Other

## 2012-10-02 ENCOUNTER — Emergency Department (HOSPITAL_COMMUNITY)
Admission: EM | Admit: 2012-10-02 | Discharge: 2012-10-02 | Disposition: A | Discharge status: Home / Self Care | Payer: Medicaid Other | Attending: Emergency Medicine | Admitting: Emergency Medicine

## 2012-10-02 ENCOUNTER — Encounter (HOSPITAL_COMMUNITY): Payer: Self-pay | Admitting: *Deleted

## 2012-10-02 DIAGNOSIS — M542 Cervicalgia: Secondary | ICD-10-CM | POA: Insufficient documentation

## 2012-10-02 DIAGNOSIS — M25529 Pain in unspecified elbow: Secondary | ICD-10-CM | POA: Insufficient documentation

## 2012-10-02 DIAGNOSIS — R51 Headache: Secondary | ICD-10-CM | POA: Insufficient documentation

## 2012-10-02 DIAGNOSIS — M79609 Pain in unspecified limb: Secondary | ICD-10-CM | POA: Insufficient documentation

## 2012-10-02 DIAGNOSIS — H9209 Otalgia, unspecified ear: Secondary | ICD-10-CM | POA: Insufficient documentation

## 2012-10-02 DIAGNOSIS — F17200 Nicotine dependence, unspecified, uncomplicated: Secondary | ICD-10-CM | POA: Insufficient documentation

## 2012-10-02 MED ORDER — OXYCODONE-ACETAMINOPHEN 5-325 MG PO TABS: 1.0000 | ORAL_TABLET | Freq: Four times a day (QID) | ORAL | Status: DC | PRN

## 2012-10-02 MED ORDER — OXYCODONE-ACETAMINOPHEN 5-325 MG PO TABS
1.0000 | ORAL_TABLET | Freq: Once | ORAL | Status: AC
  Administered 2012-10-02: 1 via ORAL
  Filled 2012-10-02: qty 1

## 2012-10-02 NOTE — ED Provider Notes (Signed)
Pt is a 57 yo F w no sig PMHx presented to ER c/o elbow pain and Left temporal HA. Plan per previous provider is dispo pending Sed rate. Note CT head reassuring. On re-evaluation pt reports onset of s/s began 1 mo ago & have been gradually worsening. HA is constant and associated w burning sensation, intermittent left eye pain, Left TMJ pain and left otalgia. Pt denies recent abx use and reports having a sinus infection 2 months ago.  Pt is non tender over temporal region. Good periphery vision, and acuity equal bilaterally per tech. No focal neuro deficits on exam. Mildly elevated sed rate & pt presentation discussed with attending Dr. Lita Mains, will discuss with neurology.   Component     Latest Ref Rng 10/02/2012  Sed Rate     0 - 22 mm/hr 49 (H)   Consult neurology: Dr. Nicole Kindred, mildly elevated sed rate. Non concerning exam. Normal CT & no focal neuro deficits. Pt denies vision changes. Low concern for temp arteritis. Strict return precautions discussed adn recommend PCP follow up ASAP for further evaluation of HA.  At this time there does not appear to be any evidence of an acute emergency medical condition and the patient appears stable for discharge with appropriate outpatient follow up.Diagnosis was discussed with patient who verbalizes understanding and is agreeable to discharge.    Verl Dicker, [place] 10/02/12 2211

## 2012-10-02 NOTE — ED Provider Notes (Signed)
Medical screening examination/treatment/procedure(s) were performed by non-physician practitioner and as supervising physician I was immediately available for consultation/collaboration.   Julianne Rice, MD 10/02/12 [phone]

## 2012-10-02 NOTE — ED Notes (Signed)
Pt reports intermittent headaches for extended amount of time. Also having intermittent pain to left elbow, feels like its "on fire." no relief with vicodin at home. Denies n/v. Reports dizziness and left side ear pain also. Pt ambulatory at triage, no acute distress noted, no neuro deficits noted.

## 2012-10-02 NOTE — ED Provider Notes (Signed)
History     CSN: [id number]  Arrival date & time 10/02/12  35   First MD Initiated Contact with Patient 10/02/12 1818      Chief Complaint  Patient presents with  . Headache  . Arm Pain    (Consider location/radiation/quality/duration/timing/severity/associated sxs/prior treatment) Patient is a 57 y.o. female presenting with headaches and arm pain.  Headache Associated symptoms: ear pain and neck pain   Associated symptoms: no abdominal pain, no back pain, no diarrhea, no pain, no nausea, no neck stiffness, no numbness and no vomiting   Arm Pain Associated symptoms include headaches. Pertinent negatives include no chest pain, no abdominal pain and no shortness of breath.   patient has had a left-sided headache on and off for the last month. She states she's had headaches like this before but never lasted this long. She states as throbbing. She states to put on her neck. She states it hurts for her ear down to her neck. No trouble seeing. No photophobia. No trauma. The pain is sharp. No fevers. She also states that she's had pain in her elbow. She states she has to sleep with her arm on a pillow so does not hurt. There is no trauma to the elbow. The pain is on the outside elbow. It is worse with movement and palpation. No numbness or weakness.  History reviewed. No pertinent past medical history.  Past Surgical History  Procedure Laterality Date  . Hernia repair      History reviewed. No pertinent family history.  History  Substance Use Topics  . Smoking status: Current Some Day Smoker  . Smokeless tobacco: Not on file  . Alcohol Use: Yes    OB History   Grav Para Term Preterm Abortions TAB SAB Ect Mult Living                  Review of Systems  Constitutional: Negative for activity change and appetite change.  HENT: Positive for ear pain and neck pain. Negative for neck stiffness.   Eyes: Negative for pain.  Respiratory: Negative for chest tightness and shortness  of breath.   Cardiovascular: Negative for chest pain and leg swelling.  Gastrointestinal: Negative for nausea, vomiting, abdominal pain and diarrhea.  Genitourinary: Negative for flank pain.  Musculoskeletal: Negative for back pain.  Skin: Negative for rash.  Neurological: Positive for headaches. Negative for weakness and numbness.  Psychiatric/Behavioral: Negative for behavioral problems.    Allergies  Latex  Home Medications   Current Outpatient Rx  Name  Route  Sig  Dispense  Refill  . HYDROcodone-acetaminophen (NORCO/VICODIN) 5-325 MG per tablet   Oral   Take 1 tablet by mouth every 6 (six) hours as needed for pain.         Marland Kitchen oxyCODONE-acetaminophen (PERCOCET/ROXICET) 5-325 MG per tablet   Oral   Take 1-2 tablets by mouth every 6 (six) hours as needed for pain.   10 tablet   0     BP 139/77  Pulse 86  Temp(Src) 98.1 F (36.7 C) (Oral)  Resp 18  SpO2 99%  Physical Exam  Nursing note and vitals reviewed. Constitutional: She is oriented to person, place, and time. She appears well-developed and well-nourished.  HENT:  Head: Normocephalic and atraumatic.  Right Ear: External ear normal.  Minimal effusion the left ear. No mastoid tenderness. No tenderness to left temporal area, but patient states this is where it hurts  Eyes: EOM are normal. Pupils are equal, round, and reactive to  light.  Neck: Normal range of motion. Neck supple.  Cardiovascular: Normal rate, regular rhythm and normal heart sounds.   No murmur heard. Pulmonary/Chest: Effort normal and breath sounds normal. No respiratory distress. She has no wheezes. She has no rales.  Abdominal: Soft. Bowel sounds are normal. She exhibits no distension. There is no tenderness. There is no rebound and no guarding.  Musculoskeletal: Normal range of motion. She exhibits tenderness.  Tenderness over left elbow laterally. Mild pain on her antecubital area. No swelling. No erythema. Neurovascularly intact distally.  Strong radial pulse.  Neurological: She is alert and oriented to person, place, and time. No cranial nerve deficit.  Skin: Skin is warm and dry.  Psychiatric: She has a normal mood and affect. Her speech is normal.    ED Course  Procedures (including critical care time)  Labs Reviewed  SEDIMENTATION RATE   Dg Elbow Complete Left  10/02/2012  *RADIOLOGY REPORT*  Clinical Data: Left elbow pain.  LEFT ELBOW - COMPLETE 3+ VIEW  Comparison: None  Findings: There are elbow joint degenerative changes but no acute fracture or osteochondral abnormality.  No joint effusion.  IMPRESSION: Degenerative changes but no acute bony findings.   Original Report Authenticated By: Marijo Sanes, M.D.    Ct Head Wo Contrast  10/02/2012  *RADIOLOGY REPORT*  Clinical Data: Intermittent headaches  CT HEAD WITHOUT CONTRAST  Technique:  Contiguous axial images were obtained from the base of the skull through the vertex without contrast.  Comparison: None.  Findings:  No acute intracranial hemorrhage, acute infarction, mass lesion, mass effect, midline shift or hydrocephalus.  Gray-white differentiation is preserved throughout.  Unremarkable appearance of the globes and orbits.  No focal soft tissue or calvarial abnormality.  Normal aeration of the mastoid air cells and paranasal sinuses.  IMPRESSION: Normal head CT.   Original Report Authenticated By: Jacqulynn Cadet, M.D.      1. Headache   2. Elbow pain       MDM  Patient with headache. Like previous but this one has lasted longer. Head CT is reassuring. Also left elbow pain. Chronic degenerative changes but no fracture. Patient feels better after medication. Patient likely be discharged, however sedimentation rate is pending at this time.        Jasper Riling. Alvino Chapel, MD 10/02/12 2039

## 2013-01-13 ENCOUNTER — Other Ambulatory Visit: Payer: Self-pay

## 2013-01-13 DIAGNOSIS — Z1231 Encounter for screening mammogram for malignant neoplasm of breast: Secondary | ICD-10-CM

## 2013-02-18 ENCOUNTER — Ambulatory Visit
Admission: RE | Admit: 2013-02-18 | Discharge: 2013-02-18 | Disposition: A | Discharge status: Home / Self Care | Payer: Medicaid Other | Source: Ambulatory Visit

## 2013-02-18 DIAGNOSIS — Z1231 Encounter for screening mammogram for malignant neoplasm of breast: Secondary | ICD-10-CM

## 2013-03-02 ENCOUNTER — Encounter (HOSPITAL_COMMUNITY): Payer: Self-pay | Admitting: Emergency Medicine

## 2013-03-02 ENCOUNTER — Emergency Department (HOSPITAL_COMMUNITY)
Admission: EM | Admit: 2013-03-02 | Discharge: 2013-03-02 | Disposition: A | Discharge status: Home / Self Care | Payer: Medicaid Other | Attending: Emergency Medicine | Admitting: Emergency Medicine

## 2013-03-02 DIAGNOSIS — F17200 Nicotine dependence, unspecified, uncomplicated: Secondary | ICD-10-CM | POA: Insufficient documentation

## 2013-03-02 DIAGNOSIS — M109 Gout, unspecified: Secondary | ICD-10-CM | POA: Insufficient documentation

## 2013-03-02 DIAGNOSIS — Z91040 Latex allergy status: Secondary | ICD-10-CM | POA: Insufficient documentation

## 2013-03-02 MED ORDER — COLCHICINE 0.6 MG PO TABS: 0.6000 mg | ORAL_TABLET | Freq: Two times a day (BID) | ORAL | Status: DC

## 2013-03-02 MED ORDER — HYDROCODONE-ACETAMINOPHEN 5-325 MG PO TABS: 1.0000 | ORAL_TABLET | ORAL | Status: DC | PRN

## 2013-03-02 NOTE — ED Provider Notes (Signed)
History  This chart was scribed for Quincy Carnes, PA-C working with Houston Siren, MD by Frederich Balding, ED scribe. This patient was seen in room TR08C/TR08C and the patient's care was started at 3:09 PM.  CSN: [id number] Arrival date & time 03/02/13  1458   Chief Complaint  Patient presents with  . Foot Pain    left foot   The history is provided by the patient. No language interpreter was used.   HPI Comments: Tiffany Coleman is a 57 y.o. female who presents to the Emergency Department complaining of sudden onset, constant left big toe pain that started last night. No injury or trauma.  Pt states she was sitting down and when she went to walk she felt pain in her left foot. Pt states the pain is worse at the bottom of her left big toe. She states she can ambulate but she can't put a lot of pressure on the bottom of her left foot. Pt states some swelling and redness of her toe.  Pt states she has been told before that she might have some gout. She states she was drinking some EtOH last night and has eaten red meat.  Denies numbness or paresthesias of foot.  No pain meds taken PTA.  History reviewed. No pertinent past medical history. Past Surgical History  Procedure Laterality Date  . Hernia repair    . Abdominal hysterectomy     No family history on file. History  Substance Use Topics  . Smoking status: Current Some Day Smoker  . Smokeless tobacco: Not on file  . Alcohol Use: Yes   OB History   Grav Para Term Preterm Abortions TAB SAB Ect Mult Living                 Review of Systems  Musculoskeletal: Positive for joint swelling and arthralgias.  All other systems reviewed and are negative.    Allergies  Latex  Home Medications   Current Outpatient Rx  Name  Route  Sig  Dispense  Refill  . HYDROcodone-acetaminophen (NORCO/VICODIN) 5-325 MG per tablet   Oral   Take 1 tablet by mouth every 6 (six) hours as needed for pain.         Marland Kitchen oxyCODONE-acetaminophen  (PERCOCET/ROXICET) 5-325 MG per tablet   Oral   Take 1-2 tablets by mouth every 6 (six) hours as needed for pain.   10 tablet   0    BP 128/81  Pulse 88  Temp(Src) 98.3 F (36.8 C) (Oral)  Resp 20  Ht 5' 3" (1.6 m)  Wt 216 lb (97.977 kg)  BMI 38.27 kg/m2  SpO2 98%  Physical Exam  Nursing note and vitals reviewed. Constitutional: She is oriented to person, place, and time. She appears well-developed and well-nourished.  HENT:  Head: Normocephalic and atraumatic.  Eyes: Conjunctivae and EOM are normal. Pupils are equal, round, and reactive to light.  Neck: Normal range of motion.  Cardiovascular: Normal rate, regular rhythm and normal heart sounds.   Pulmonary/Chest: Effort normal and breath sounds normal.  Musculoskeletal: Normal range of motion.  Left big toe MTP joint erythematous, warm, and TTP, full ROM of toe, sensation intact, no breaks in skin or signs of injury   Neurological: She is alert and oriented to person, place, and time.  Skin: Skin is warm and dry.  Psychiatric: She has a normal mood and affect.    ED Course  Procedures (including critical care time)  DIAGNOSTIC STUDIES: Oxygen Saturation is  98% on RA, normal by my interpretation.    COORDINATION OF CARE: 3:28 PM-Discussed treatment plan which includes pain medication and gout medication with pt at bedside and pt agreed to plan.   Labs Reviewed - No data to display No results found.   1. Gout flare     MDM   Physical exam findings and symptoms consistent with gout flare. Rx colchicine and Vicodin. Followup with primary care physician if symptoms not improving. Discussed them with patient, she agreed. Return precautions advised.  I personally performed the services described in this documentation, which was scribed in my presence. The recorded information has been reviewed and is accurate.   Larene Pickett, PA-C 03/02/13 1541  Larene Pickett, PA-C 03/02/13 1551

## 2013-03-02 NOTE — ED Notes (Signed)
Pt reports that she was sitting down and when she went to walk she felt pain in left foot. Pt reports able to walk today but cannot put a lot of pressure on bottom of left foot.

## 2013-03-02 NOTE — ED Notes (Signed)
No changes from triage assessment

## 2013-03-05 NOTE — ED Provider Notes (Signed)
Medical screening examination/treatment/procedure(s) were performed by non-physician practitioner and as supervising physician I was immediately available for consultation/collaboration.  Houston Siren, MD 03/05/13 1113

## 2013-08-14 DIAGNOSIS — Z860100 Personal history of colon polyps, unspecified: Secondary | ICD-10-CM

## 2013-08-14 HISTORY — DX: Personal history of colonic polyps: Z86.010

## 2013-08-14 HISTORY — DX: Personal history of colon polyps, unspecified: Z86.0100

## 2013-09-01 ENCOUNTER — Encounter (INDEPENDENT_AMBULATORY_CARE_PROVIDER_SITE_OTHER): Payer: Self-pay | Admitting: Surgery

## 2013-09-08 ENCOUNTER — Encounter (INDEPENDENT_AMBULATORY_CARE_PROVIDER_SITE_OTHER): Payer: Self-pay | Admitting: Surgery

## 2013-09-08 ENCOUNTER — Ambulatory Visit (INDEPENDENT_AMBULATORY_CARE_PROVIDER_SITE_OTHER): Payer: Medicaid Other | Admitting: Surgery

## 2013-09-08 VITALS — BP 116/82 | HR 84 | Temp 98.0°F | Resp 14 | Ht 63.0 in | Wt 213.0 lb

## 2013-09-08 DIAGNOSIS — K432 Incisional hernia without obstruction or gangrene: Secondary | ICD-10-CM

## 2013-09-08 HISTORY — DX: Incisional hernia without obstruction or gangrene: K43.2

## 2013-09-08 NOTE — Progress Notes (Signed)
Patient ID: Tiffany Coleman, female   DOB: 1956-03-12, 58 y.o.   MRN: [id number]  Chief Complaint  Patient presents with  . New Evaluation    eval Vental hernia    HPI Tiffany Coleman is a 58 y.o. female.  Referred by Dr. Jeanie Cooks for evaluation of ventral hernia  HPI This is a 58 year old female with a very extensive past surgical history who is status post open ventral hernia repair with mesh in 2012. Most of her previous surgery was performed in [place] and despite our best efforts, we were unable to get medical records. She had a 10 cm hernia defect that was repaired with underlying physio- mesh. The mesh measured 20 x 25 cm. Over the last several months she has developed a slowly enlarging bulge in her left upper quadrant. This has become more uncomfortable. She continues to have bowel movements without difficulty. She had a recent colonoscopy at [facility]. She had multiple polyps which were removed but none of these were malignant.  She comes in today for evaluation of a possible recurrent ventral hernia.  History reviewed. No pertinent past medical history.  Past Surgical History  Procedure Laterality Date  . Hernia repair    . Abdominal hysterectomy      History reviewed. No pertinent family history.  Social History History  Substance Use Topics  . Smoking status: Current Some Day Smoker  . Smokeless tobacco: Never Used  . Alcohol Use: Yes    Allergies  Allergen Reactions  . Codeine   . Latex Hives and Itching  . Penicillins     Current Outpatient Prescriptions  Medication Sig Dispense Refill  . zolpidem (AMBIEN) 5 MG tablet Take 5 mg by mouth at bedtime as needed for sleep.       No current facility-administered medications for this visit.    Review of Systems Review of Systems  Constitutional: Negative for fever, chills and unexpected weight change.  HENT: Negative for congestion, hearing loss, sore throat, trouble swallowing and voice change.   Eyes:  Negative for visual disturbance.  Respiratory: Negative for cough and wheezing.   Cardiovascular: Negative for chest pain, palpitations and leg swelling.  Gastrointestinal: Positive for abdominal pain and abdominal distention. Negative for nausea, vomiting, diarrhea, constipation, blood in stool and anal bleeding.  Genitourinary: Negative for hematuria, vaginal bleeding and difficulty urinating.  Musculoskeletal: Negative for arthralgias.  Skin: Negative for rash and wound.  Neurological: Negative for seizures, syncope and headaches.  Hematological: Negative for adenopathy. Does not bruise/bleed easily.  Psychiatric/Behavioral: Negative for confusion.    Blood pressure 116/82, pulse 84, temperature 98 F (36.7 C), temperature source Temporal, resp. rate 14, height 5' 3" (1.6 m), weight 213 lb (96.616 kg).  Physical Exam Physical Exam WDWN in NAD HEENT:  EOMI, sclera anicteric Neck:  No masses, no thyromegaly Lungs:  CTA bilaterally; normal respiratory effort CV:  Regular rate and rhythm; no murmurs Abd:  +bowel sounds, soft, well-healed incision which remains flat; in the LUQ above the previous incision, there is a slight bulge.  This bulge resolves when supine. Ext:  Well-perfused; no edema Skin:  Warm, dry; no sign of jaundice  Data Reviewed none  Assessment    Possible recurrent ventral hernia - this appears to be above the previous repair.     Plan    CT scan of the abdomen and pelvis with contrast to better determine the location and size of the hernia along with the number of defects, which will help [place]  plan surgical repair.  Follow-up 2 weeks.         Tiffany Coleman K. 09/08/2013, 9:46 AM

## 2013-09-12 ENCOUNTER — Ambulatory Visit
Admission: RE | Admit: 2013-09-12 | Discharge: 2013-09-12 | Disposition: A | Discharge status: Home / Self Care | Payer: Medicaid Other | Source: Ambulatory Visit | Attending: Surgery | Admitting: Surgery

## 2013-09-12 DIAGNOSIS — K432 Incisional hernia without obstruction or gangrene: Secondary | ICD-10-CM

## 2013-09-12 MED ORDER — IOHEXOL 300 MG/ML  SOLN
125.0000 mL | Freq: Once | INTRAMUSCULAR | Status: AC | PRN
  Administered 2013-09-12: 125 mL via INTRAVENOUS

## 2013-09-30 ENCOUNTER — Encounter (INDEPENDENT_AMBULATORY_CARE_PROVIDER_SITE_OTHER): Payer: Medicaid Other | Admitting: Surgery

## 2013-10-10 ENCOUNTER — Encounter (HOSPITAL_COMMUNITY): Payer: Self-pay | Admitting: Emergency Medicine

## 2013-10-10 ENCOUNTER — Emergency Department (HOSPITAL_COMMUNITY)
Admission: EM | Admit: 2013-10-10 | Discharge: 2013-10-11 | Disposition: A | Discharge status: Home / Self Care | Payer: Medicaid Other | Attending: Emergency Medicine | Admitting: Emergency Medicine

## 2013-10-10 DIAGNOSIS — F17200 Nicotine dependence, unspecified, uncomplicated: Secondary | ICD-10-CM | POA: Insufficient documentation

## 2013-10-10 DIAGNOSIS — M25519 Pain in unspecified shoulder: Secondary | ICD-10-CM | POA: Insufficient documentation

## 2013-10-10 DIAGNOSIS — M7918 Myalgia, other site: Secondary | ICD-10-CM

## 2013-10-10 DIAGNOSIS — Z79899 Other long term (current) drug therapy: Secondary | ICD-10-CM | POA: Insufficient documentation

## 2013-10-10 DIAGNOSIS — R609 Edema, unspecified: Secondary | ICD-10-CM | POA: Insufficient documentation

## 2013-10-10 DIAGNOSIS — Z91040 Latex allergy status: Secondary | ICD-10-CM | POA: Insufficient documentation

## 2013-10-10 DIAGNOSIS — Z880 Allergy status to penicillin: Secondary | ICD-10-CM | POA: Insufficient documentation

## 2013-10-10 DIAGNOSIS — R51 Headache: Secondary | ICD-10-CM | POA: Insufficient documentation

## 2013-10-10 DIAGNOSIS — R071 Chest pain on breathing: Secondary | ICD-10-CM | POA: Insufficient documentation

## 2013-10-10 DIAGNOSIS — M542 Cervicalgia: Secondary | ICD-10-CM | POA: Insufficient documentation

## 2013-10-10 LAB — BASIC METABOLIC PANEL
BUN: 13 mg/dL (ref 6–23)
CALCIUM: 10.2 mg/dL (ref 8.4–10.5)
CHLORIDE: 102 meq/L (ref 96–112)
CO2: 30 meq/L (ref 19–32)
CREATININE: 0.78 mg/dL (ref 0.50–1.10)
GFR calc non Af Amer: 90 mL/min — ABNORMAL LOW (ref >90)
Glucose, Bld: 97 mg/dL (ref 70–99)
Potassium: 4.5 mEq/L (ref 3.7–5.3)
Sodium: 141 mEq/L (ref 137–147)

## 2013-10-10 LAB — CBC
HEMATOCRIT: 38.5 % (ref 36.0–46.0)
Hemoglobin: 12.7 g/dL (ref 12.0–15.0)
MCH: 27.6 pg (ref 26.0–34.0)
MCHC: 33 g/dL (ref 30.0–36.0)
MCV: 83.7 fL (ref 78.0–100.0)
PLATELETS: 254 10*3/uL (ref 150–400)
RBC: 4.6 MIL/uL (ref 3.87–5.11)
RDW: 14.4 % (ref 11.5–15.5)
WBC: 7.4 10*3/uL (ref 4.0–10.5)

## 2013-10-10 LAB — I-STAT TROPONIN, ED: Troponin i, poc: 0 ng/mL (ref 0.00–0.08)

## 2013-10-10 NOTE — ED Notes (Signed)
Pt reporting pain with inspiration to right side. Sts this has been present for about 2 days.

## 2013-10-10 NOTE — ED Notes (Signed)
Presents with bilateral hand and leg swelling began last night, per pt the swelling has decreased since last night, swelling noted to feet, associated with headache and sternal chest pressure. Denies SOB. States, "last night I felt so swollen and everything hurt, but my chest felt like it was full and like it was palpitations. I couldn't sleep. Do you think it may be fluid built up. Everything is just so tight" breath sounds clear. Sats 99%.

## 2013-10-11 ENCOUNTER — Emergency Department (HOSPITAL_COMMUNITY): Payer: Medicaid Other

## 2013-10-11 LAB — PRO B NATRIURETIC PEPTIDE: Pro B Natriuretic peptide (BNP): 57 pg/mL (ref 0–125)

## 2013-10-11 MED ORDER — FUROSEMIDE 20 MG PO TABS: 10.0000 mg | ORAL_TABLET | Freq: Every day | ORAL | Status: DC

## 2013-10-11 MED ORDER — TRAMADOL HCL 50 MG PO TABS: 50.0000 mg | ORAL_TABLET | Freq: Four times a day (QID) | ORAL | Status: DC | PRN

## 2013-10-11 NOTE — Discharge Instructions (Signed)
Chest Wall Pain Chest wall pain is pain in or around the bones and muscles of your chest. It may take up to 6 weeks to get better. It may take longer if you must stay physically active in your work and activities.  CAUSES  Chest wall pain may happen on its own. However, it may be caused by:  A viral illness like the flu.  Injury.  Coughing.  Exercise.  Arthritis.  Fibromyalgia.  Shingles. HOME CARE INSTRUCTIONS   Avoid overtiring physical activity. Try not to strain or perform activities that cause pain. This includes any activities using your chest or your abdominal and side muscles, especially if heavy weights are used.  Put ice on the sore area.  Put ice in a plastic bag.  Place a towel between your skin and the bag.  Leave the ice on for 15-20 minutes per hour while awake for the first 2 days.  Only take over-the-counter or prescription medicines for pain, discomfort, or fever as directed by your caregiver. SEEK IMMEDIATE MEDICAL CARE IF:   Your pain increases, or you are very uncomfortable.  You have a fever.  Your chest pain becomes worse.  You have new, unexplained symptoms.  You have nausea or vomiting.  You feel sweaty or lightheaded.  You have a cough with phlegm (sputum), or you cough up blood. MAKE SURE YOU:   Understand these instructions.  Will watch your condition.  Will get help right away if you are not doing well or get worse. Document Released: 07/31/2005 Document Revised: 10/23/2011 Document Reviewed: 03/27/2011 [facility] Patient Information 2014 [place], [place].  Edema Edema is an abnormal build-up of fluids in tissues. Because this is partly dependent on gravity (water flows to the lowest place), it is more common in the legs and thighs (lower extremities). It is also common in the looser tissues, like around the eyes. Painless swelling of the feet and ankles is common and increases as a person ages. It may affect both legs and may include  the calves or even thighs. When squeezed, the fluid may move out of the affected area and may leave a dent for a few moments. CAUSES   Prolonged standing or sitting in one place for extended periods of time. Movement helps pump tissue fluid into the veins, and absence of movement prevents this, resulting in edema.  Varicose veins. The valves in the veins do not work as well as they should. This causes fluid to leak into the tissues.  Fluid and salt overload.  Injury, burn, or surgery to the leg, ankle, or foot, may damage veins and allow fluid to leak out.  Sunburn damages vessels. Leaky vessels allow fluid to go out into the sunburned tissues.  Allergies (from insect bites or stings, medications or chemicals) cause swelling by allowing vessels to become leaky.  Protein in the blood helps keep fluid in your vessels. Low protein, as in malnutrition, allows fluid to leak out.  Hormonal changes, including pregnancy and menstruation, cause fluid retention. This fluid may leak out of vessels and cause edema.  Medications that cause fluid retention. Examples are sex hormones, blood pressure medications, steroid treatment, or anti-depressants.  Some illnesses cause edema, especially heart failure, kidney disease, or liver disease.  Surgery that cuts veins or lymph nodes, such as surgery done for the heart or for breast cancer, may result in edema. DIAGNOSIS  Your caregiver is usually easily able to determine what is causing your swelling (edema) by simply asking what is  wrong (getting a history) and examining you (doing a physical). Sometimes x-rays, EKG (electrocardiogram or heart tracing), and blood work may be done to evaluate for underlying medical illness. TREATMENT  General treatment includes:  Leg elevation (or elevation of the affected body part).  Restriction of fluid intake.  Prevention of fluid overload.  Compression of the affected body part. Compression with elastic bandages  or support stockings squeezes the tissues, preventing fluid from entering and forcing it back into the blood vessels.  Diuretics (also called water pills or fluid pills) pull fluid out of your body in the form of increased urination. These are effective in reducing the swelling, but can have side effects and must be used only under your caregiver's supervision. Diuretics are appropriate only for some types of edema. The specific treatment can be directed at any underlying causes discovered. Heart, liver, or kidney disease should be treated appropriately. HOME CARE INSTRUCTIONS   Elevate the legs (or affected body part) above the level of the heart, while lying down.  Avoid sitting or standing still for prolonged periods of time.  Avoid putting anything directly under the knees when lying down, and do not wear constricting clothing or garters on the upper legs.  Exercising the legs causes the fluid to work back into the veins and lymphatic channels. This may help the swelling go down.  The pressure applied by elastic bandages or support stockings can help reduce ankle swelling.  A low-salt diet may help reduce fluid retention and decrease the ankle swelling.  Take any medications exactly as prescribed. SEEK MEDICAL CARE IF:  Your edema is not responding to recommended treatments. SEEK IMMEDIATE MEDICAL CARE IF:   You develop shortness of breath or chest pain.  You cannot breathe when you lay down; or if, while lying down, you have to get up and go to the window to get your breath.  You are having increasing swelling without relief from treatment.  You develop a fever over 102 F (38.9 C).  You develop pain or redness in the areas that are swollen.  Tell your caregiver right away if you have gained 03 lb/1.4 kg in 1 day or 05 lb/2.3 kg in a week. MAKE SURE YOU:   Understand these instructions. Musculoskeletal Pain Musculoskeletal pain is muscle and boney aches and pains. These  pains can occur in any part of the body. Your caregiver may treat you without knowing the cause of the pain. They may treat you if blood or urine tests, X-rays, and other tests were normal.  CAUSES There is often not a definite cause or reason for these pains. These pains may be caused by a type of germ (virus). The discomfort may also come from overuse. Overuse includes working out too hard when your body is not fit. Boney aches also come from weather changes. Bone is sensitive to atmospheric pressure changes. HOME CARE INSTRUCTIONS  Ask when your test results will be ready. Make sure you get your test results. Only take over-the-counter or prescription medicines for pain, discomfort, or fever as directed by your caregiver. If you were given medications for your condition, do not drive, operate machinery or power tools, or sign legal documents for 24 hours. Do not drink alcohol. Do not take sleeping pills or other medications that may interfere with treatment. Continue all activities unless the activities cause more pain. When the pain lessens, slowly resume normal activities. Gradually increase the intensity and duration of the activities or exercise. During periods of  severe pain, bed rest may be helpful. Lay or sit in any position that is comfortable. Putting ice on the injured area. Put ice in a bag. Place a towel between your skin and the bag. Leave the ice on for 15 to 20 minutes, 3 to 4 times a day. Follow up with your caregiver for continued problems and no reason can be found for the pain. If the pain becomes worse or does not go away, it may be necessary to repeat tests or do additional testing. Your caregiver may need to look further for a possible cause. SEEK IMMEDIATE MEDICAL CARE IF: You have pain that is getting worse and is not relieved by medications. You develop chest pain that is associated with shortness or breath, sweating, feeling sick to your stomach (nauseous), or throw up  (vomit). Your pain becomes localized to the abdomen. You develop any new symptoms that seem different or that concern you. MAKE SURE YOU:  Understand these instructions. Will watch your condition. Will get help right away if you are not doing well or get worse. Document Released: 07/31/2005 Document Revised: 10/23/2011 Document Reviewed: 04/04/2013 [facility] Patient Information 2014 [place].  Peripheral Edema You have swelling in your legs (peripheral edema). This swelling is due to excess accumulation of salt and water in your body. Edema may be a sign of heart, kidney or liver disease, or a side effect of a medication. It may also be due to problems in the leg veins. Elevating your legs and using special support stockings may be very helpful, if the cause of the swelling is due to poor venous circulation. Avoid long periods of standing, whatever the cause. Treatment of edema depends on identifying the cause. Chips, pretzels, pickles and other salty foods should be avoided. Restricting salt in your diet is almost always needed. Water pills (diuretics) are often used to remove the excess salt and water from your body via urine. These medicines prevent the kidney from reabsorbing sodium. This increases urine flow. Diuretic treatment may also result in lowering of potassium levels in your body. Potassium supplements may be needed if you have to use diuretics daily. Daily weights can help you keep track of your progress in clearing your edema. You should call your caregiver for follow up care as recommended. SEEK IMMEDIATE MEDICAL CARE IF:  You have increased swelling, pain, redness, or heat in your legs. You develop shortness of breath, especially when lying down. You develop chest or abdominal pain, weakness, or fainting. You have a fever. Document Released: 09/07/2004 Document Revised: 10/23/2011 Document Reviewed: 08/18/2009 [facility] Patient Information 2014 [place].   Will  watch your condition.  Will get help right away if you are not doing well or get worse. Document Released: 07/31/2005 Document Revised: 01/30/2012 Document Reviewed: 03/18/2008 [facility] Patient Information 2014 [place].

## 2013-10-11 NOTE — ED Provider Notes (Signed)
CSN: [id number]     Arrival date & time 10/10/13  1912 History   First MD Initiated Contact with Patient 10/11/13 0106     Chief Complaint  Patient presents with  . Leg Swelling     (Consider location/radiation/quality/duration/timing/severity/associated sxs/prior Treatment) HPI 58 yo female presents to the ER from home with complaint of diffuse swelling in hands, feet, ankles, and pain to back of head, neck, shoulders, chest wall.  No sob, diaphoresis, fever, n/v.  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  No h/o chf.  Pt reports sxs much worse yesterday with swelling, pain, but improved today.  Sxs improved mainly with keeping feet elevated.  No medications taken for head or body pain. History reviewed. No pertinent past medical history. Past Surgical History  Procedure Laterality Date  . Hernia repair    . Abdominal hysterectomy     History reviewed. No pertinent family history. History  Substance Use Topics  . Smoking status: Current Some Day Smoker    Types: Cigarettes  . Smokeless tobacco: Never Used  . Alcohol Use: Yes   OB History   Grav Para Term Preterm Abortions TAB SAB Ect Mult Living                 Review of Systems  All other systems reviewed and are negative.      Allergies  Codeine; Latex; and Penicillins  Home Medications   Current Outpatient Rx  Name  Route  Sig  Dispense  Refill  . albuterol (PROVENTIL HFA;VENTOLIN HFA) 108 (90 BASE) MCG/ACT inhaler   Inhalation   Inhale 1-2 puffs into the lungs every 6 (six) hours as needed for wheezing or shortness of breath.         . Chlorphen-Phenyleph-APAP (TYLENOL SINUS CONGESTION/PAIN) 2-5-325 MG TABS   Oral   Take 1 tablet by mouth daily as needed (for sinus problems).         . zolpidem (AMBIEN) 5 MG tablet   Oral   Take 5 mg by mouth at bedtime as needed for sleep.         . furosemide (LASIX) 20 MG tablet   Oral   Take 0.5 tablets (10 mg total) by mouth  daily. As needed for edema   10 tablet   0   . traMADol (ULTRAM) 50 MG tablet   Oral   Take 1 tablet (50 mg total) by mouth every 6 (six) hours as needed.   15 tablet   0    BP 132/74  Pulse 72  Temp(Src) 98.1 F (36.7 C) (Oral)  Resp 16  SpO2 100% Physical Exam  Nursing note and vitals reviewed. Constitutional: She is oriented to person, place, and time. She appears well-developed and well-nourished. No distress.  HENT:  Head: Normocephalic and atraumatic.  Right Ear: External ear normal.  Left Ear: External ear normal.  Nose: Nose normal.  Mouth/Throat: Oropharynx is clear and moist.  Eyes: Conjunctivae and EOM are normal. Pupils are equal, round, and reactive to light.  Neck: Normal range of motion. Neck supple. No JVD present. No tracheal deviation present. No thyromegaly present.  Cardiovascular: Normal rate, regular rhythm, normal heart sounds and intact distal pulses.  Exam reveals no gallop and no friction rub.   No murmur heard. Pulmonary/Chest: Effort normal and breath sounds normal. No stridor. No respiratory distress. She has no wheezes. She has no rales. She exhibits tenderness.  Abdominal: Soft. Bowel sounds  are normal. She exhibits no distension and no mass. There is no tenderness. There is no rebound and no guarding.  Musculoskeletal: Normal range of motion. She exhibits edema (trace noted in fingers, ankles) and tenderness (diffusely tender to palpation over trapezius, posterior parapspinal cervical region, shoulders, anterior sternum).  Lymphadenopathy:    She has no cervical adenopathy.  Neurological: She is alert and oriented to person, place, and time. She has normal reflexes. No cranial nerve deficit. She exhibits normal muscle tone. Coordination normal.  Skin: Skin is warm and dry. No rash noted. No erythema. No pallor.  Psychiatric: She has a normal mood and affect. Her behavior is normal. Judgment and thought content normal.    ED Course  Procedures  (including critical care time) Labs Review Labs Reviewed  BASIC METABOLIC PANEL - Abnormal; Notable for the following:    GFR calc non Af Amer 90 (*)    All other components within normal limits  CBC  PRO B NATRIURETIC PEPTIDE  I-STAT TROPOININ, ED   Imaging Review Dg Chest 2 View  10/11/2013   CLINICAL DATA:  Chest pain, left shoulder pain  EXAM: CHEST  2 VIEW  COMPARISON:  11/10/2011  FINDINGS: Lungs are essentially clear. No focal consolidation. No pleural effusion or pneumothorax.  The heart is normal in size.  Mild degenerative changes of the visualized thoracolumbar spine.  IMPRESSION: No evidence of acute cardiopulmonary disease.   Electronically Signed   By: Julian Hy M.D.   On: 10/11/2013 00:33     EKG Interpretation   Date/Time:  Friday October 10 2013 19:41:05 EST Ventricular Rate:  74 PR Interval:  176 QRS Duration: 80 QT Interval:  374 QTC Calculation: 415 R Axis:   58 Text Interpretation:  Normal sinus rhythm Normal ECG Confirmed by Elman Dettman   MD, Kenric Ginger ([id number]) on 10/10/2013 11:33:27 PM      MDM   Final diagnoses:  Peripheral edema  Musculoskeletal pain    58 year old female with reported edema in hands and feet yesterday, improved today.  She has a history of standing on her feet, and admits that she may be having some salt indiscretion in her diet.  Her workup is otherwise unremarkable.  Plan have her followup with her primary care doctor.  Will give short course of low-dose Lasix and tramadol for pain.  She has been encouraged to get compression stockings for work.     Kalman Drape, MD 10/13/13 1248

## 2013-10-20 ENCOUNTER — Ambulatory Visit (INDEPENDENT_AMBULATORY_CARE_PROVIDER_SITE_OTHER): Payer: Medicaid Other | Admitting: Surgery

## 2013-10-20 ENCOUNTER — Encounter (INDEPENDENT_AMBULATORY_CARE_PROVIDER_SITE_OTHER): Payer: Self-pay | Admitting: Surgery

## 2013-10-20 VITALS — BP 127/84 | HR 83 | Temp 98.1°F | Resp 18 | Ht 63.0 in | Wt 210.6 lb

## 2013-10-20 DIAGNOSIS — K432 Incisional hernia without obstruction or gangrene: Secondary | ICD-10-CM

## 2013-10-20 NOTE — Progress Notes (Signed)
The patient returns for evaluation after her CT scan.  She continues to have some mild swelling in her LUQ near an old drain site, but the CT shows no sign of hernia in this area.  The drain was placed in the subcutaneous space.  She does have a small hernia below the mesh below the umbilicus.  This is quite small and minimally symptomatic.  The patient would like to wait a few months to have the new hernia repaired.  We will reevaluate her in 3 months.  Abdominal binder when working.    Dg Chest 2 View  10/11/2013   CLINICAL DATA:  Chest pain, left shoulder pain  EXAM: CHEST  2 VIEW  COMPARISON:  11/10/2011  FINDINGS: Lungs are essentially clear. No focal consolidation. No pleural effusion or pneumothorax.  The heart is normal in size.  Mild degenerative changes of the visualized thoracolumbar spine.  IMPRESSION: No evidence of acute cardiopulmonary disease.   Electronically Signed   By: Julian Hy M.D.   On: 10/11/2013 00:33    CLINICAL DATA: Abdominal discomfort. History of ventral hernia  repair in 2012 and prior partial hysterectomy. Evaluate for  recurrent hernia.  EXAM:  CT ABDOMEN AND PELVIS WITH CONTRAST  TECHNIQUE:  Multidetector CT imaging of the abdomen and pelvis was performed  using the standard protocol following bolus administration of  intravenous contrast.  CONTRAST: 122m OMNIPAQUE IOHEXOL 300 MG/ML SOLN  COMPARISON: Abdominal pelvic CT 07/22/2010.  FINDINGS:  The visualized lung bases are clear. There is no significant pleural  or pericardial effusion.  The liver, gallbladder, spleen and pancreas appear normal. There is  no adrenal mass. The low-density lesion involving the upper pole of  the left kidney is suboptimally characterized based on size,  although is unchanged from the prior study. The right kidney appears  normal. There is no hydronephrosis.  Ventral hernia repair has been performed in the interval. There is a  very small recurrent hernia just  inferior to the umbilicus,  containing a loop of small bowel (axial image 60 and sagittal image  83). There is no evidence of incarceration or obstruction. No other  hernias are identified.  The stomach is decompressed. The small bowel, colon and appendix  otherwise appear unremarkable. There is no evidence of pelvic mass  status post apparent partial hysterectomy. The left ovary appears to  be present and is unchanged. The bladder appears normal. There are  no inflammatory changes. There is minimal atherosclerosis.  There are no acute or worrisome osseous findings.  IMPRESSION:  1. Interval ventral hernia repair. There is a very small recurrent  hernia just inferior to the umbilicus containing a small knuckle of  small bowel. No evidence of incarceration or obstruction.  2. No acute abdominal pelvic findings.  3. Stable small low density lesion involving the upper pole of the  left kidney.  Electronically Signed  By: BCamie PatienceM.D.  On: 09/12/2013 14:10   MImogene Burn TGeorgette Dover MD, FP[facility] Center[facility]  General/ Trauma Surgery  10/20/2013 11:39 AM

## 2014-02-06 ENCOUNTER — Ambulatory Visit (INDEPENDENT_AMBULATORY_CARE_PROVIDER_SITE_OTHER): Payer: Medicaid Other | Admitting: Surgery

## 2014-02-11 ENCOUNTER — Encounter (INDEPENDENT_AMBULATORY_CARE_PROVIDER_SITE_OTHER): Payer: Self-pay | Admitting: Surgery

## 2014-04-01 ENCOUNTER — Other Ambulatory Visit: Payer: Self-pay

## 2014-04-01 DIAGNOSIS — Z1231 Encounter for screening mammogram for malignant neoplasm of breast: Secondary | ICD-10-CM

## 2014-04-15 ENCOUNTER — Ambulatory Visit: Payer: Medicaid Other

## 2014-05-12 ENCOUNTER — Ambulatory Visit: Payer: Medicaid Other

## 2014-05-18 ENCOUNTER — Ambulatory Visit: Payer: Medicaid Other

## 2014-07-01 ENCOUNTER — Ambulatory Visit: Payer: Medicaid Other

## 2014-08-06 ENCOUNTER — Ambulatory Visit: Payer: Medicaid Other

## 2014-08-11 ENCOUNTER — Other Ambulatory Visit: Payer: Self-pay

## 2014-08-11 DIAGNOSIS — I83811 Varicose veins of right lower extremities with pain: Secondary | ICD-10-CM

## 2014-08-13 ENCOUNTER — Ambulatory Visit
Admission: RE | Admit: 2014-08-13 | Discharge: 2014-08-13 | Disposition: A | Discharge status: Home / Self Care | Payer: Medicaid Other | Source: Ambulatory Visit

## 2014-08-13 DIAGNOSIS — Z1231 Encounter for screening mammogram for malignant neoplasm of breast: Secondary | ICD-10-CM

## 2014-09-22 ENCOUNTER — Other Ambulatory Visit: Payer: Self-pay

## 2014-09-22 DIAGNOSIS — I83813 Varicose veins of bilateral lower extremities with pain: Secondary | ICD-10-CM

## 2014-09-30 ENCOUNTER — Encounter: Payer: Self-pay | Admitting: Vascular Surgery

## 2014-10-01 ENCOUNTER — Ambulatory Visit (INDEPENDENT_AMBULATORY_CARE_PROVIDER_SITE_OTHER): Payer: Medicaid Other | Admitting: Vascular Surgery

## 2014-10-01 ENCOUNTER — Encounter: Payer: Self-pay | Admitting: Vascular Surgery

## 2014-10-01 ENCOUNTER — Ambulatory Visit (HOSPITAL_COMMUNITY)
Admission: RE | Admit: 2014-10-01 | Discharge: 2014-10-01 | Disposition: A | Discharge status: Home / Self Care | Payer: Medicaid Other | Source: Ambulatory Visit | Attending: Vascular Surgery | Admitting: Vascular Surgery

## 2014-10-01 VITALS — BP 155/92 | HR 84 | Ht 63.0 in | Wt 212.2 lb

## 2014-10-01 DIAGNOSIS — I83813 Varicose veins of bilateral lower extremities with pain: Secondary | ICD-10-CM | POA: Diagnosis not present

## 2014-10-01 NOTE — Progress Notes (Signed)
[facility]   Reason for referral: pain in  Left > right LE leg  History of Present Illness  Tiffany Coleman is a 59 y.o. female who presents with chief complaint: leg pain with increased weight bearing activity and at rest.  Patient notes, onset of pain 12 months ago.  The pain has progressed over the last 3-4 months.    The patient has had no history of DVT, positive history of varicose vein, no history of venous stasis ulcers, no history of  Lymphedema and no history of skin changes in lower legs.  There is no family history of venous disorders.  The patient has not used compression stockings in the past.  Her past medical history is negative for hypertension, hypercholesterolemia and DM.  History reviewed. No pertinent past medical history.  Past Surgical History  Procedure Laterality Date  . Hernia repair    . Abdominal hysterectomy      History   Social History  . Marital Status: Single    Spouse Name: N/A  . Number of Children: N/A  . Years of Education: N/A   Occupational History  . Not on file.   Social History Main Topics  . Smoking status: Current Some Day Smoker -- 20 years    Types: Cigarettes  . Smokeless tobacco: Never Used     Comment: smokes 2-3 cigs per day   . Alcohol Use: 1.2 oz/week    2 Glasses of wine per week  . Drug Use: No  . Sexual Activity: Not on file   Other Topics Concern  . Not on file   Social History Narrative    Family History  Problem Relation Age of Onset  . Deep vein thrombosis Mother   . Diabetes Mother   . Hypertension Mother   . Varicose Veins Mother   . Cancer Father   . Hypertension Sister   . Hyperlipidemia Brother   . Hypertension Daughter   . Heart attack Son     Current Outpatient Prescriptions on File Prior to Visit  Medication Sig Dispense Refill  . albuterol (PROVENTIL HFA;VENTOLIN HFA) 108 (90 BASE) MCG/ACT inhaler Inhale 1-2 puffs into the lungs every 6 (six) hours as  needed for wheezing or shortness of breath.    . Chlorphen-Phenyleph-APAP (TYLENOL SINUS CONGESTION/PAIN) 2-5-325 MG TABS Take 1 tablet by mouth daily as needed (for sinus problems).    . furosemide (LASIX) 20 MG tablet Take 0.5 tablets (10 mg total) by mouth daily. As needed for edema 10 tablet 0  . traMADol (ULTRAM) 50 MG tablet Take 1 tablet (50 mg total) by mouth every 6 (six) hours as needed. 15 tablet 0  . zolpidem (AMBIEN) 5 MG tablet Take 5 mg by mouth at bedtime as needed for sleep.     No current facility-administered medications on file prior to visit.    Allergies as of 10/01/2014 - Review Complete 10/01/2014  Allergen Reaction Noted  . Codeine Itching 03/02/2013  . Latex Hives and Itching 08/18/2011  . Penicillins Itching 03/02/2013     ROS:   General:  No weight loss, Fever, chills  HEENT: No recent headaches, no nasal bleeding, no visual changes, no sore throat  Neurologic: No dizziness, blackouts, seizures. No recent symptoms of stroke or mini- stroke. No recent episodes of slurred speech, or temporary blindness.  Cardiac: No recent episodes of chest pain/pressure, no shortness of breath at rest.  No shortness of breath with exertion.  Denies history of  atrial fibrillation or irregular heartbeat  Vascular: No history of rest pain in feet.  No history of claudication.  No history of non-healing ulcer, No history of DVT   Pulmonary: No home oxygen, no productive cough, no hemoptysis,  No asthma or wheezing  Musculoskeletal:  [x ] Arthritis, _0  Low back pain,  [x ] Joint pain  Hematologic:No history of hypercoagulable state.  No history of easy bleeding.  No history of anemia  Gastrointestinal: No hematochezia or melena,  No gastroesophageal reflux, no trouble swallowing _1  hernia  Urinary: _2  chronic Kidney disease, _3  on HD - _4  MWF or _5  TTHS, _6  Burning with urination, _7  Frequent urination, _8  Difficulty urinating;   Skin: No rashes  Psychological:  No history of anxiety,  No history of depression  Physical Examination  Filed Vitals:   10/01/14 1420  BP: 155/92  Pulse: 84  Height: 5' 3" (1.6 m)  Weight: 212 lb 3.2 oz (96.253 kg)  SpO2: 99%    Body mass index is 37.6 kg/(m^2).  General:  Alert and oriented, no acute distress HEENT: Normal Neck: No bruit or JVD Pulmonary: Clear to auscultation bilaterally Cardiac: Regular Rate and Rhythm without murmur Abdomen: Soft, non-tender, non-distended, no mass, no scars Skin: No rash Extremity Pulses:  2+ radial, brachial, femoral, dorsalis pedis, posterior tibial pulses bilaterally Musculoskeletal: No deformity or edema.  She has 25m varicosity posterior calf's bilaterally and and a collection of small vesicular veins anterior lower leg.  Neurologic: Upper and lower extremity motor 5/5 and symmetric  DATA: 10/01/2014 Venous reflux evaluation No superficial venous reflux Mild deep vein reflux on the right and left.   Assessment: Pain in lower extremitas with mild varicosities She has excellent palpable pulses bilateral LE without any evidence of ischemic or venous skin changes.  She is not at risk for limb loss.  We recommend knee high compression hose 20-30 mm and elevation of lower extremities when at rest follow up in 6 months.    She was seen in the office today in conjunction with Dr. FCarvel Getting Memorie Yokoyama [facility] [place]illeOffice: [phone]  Patient with bilateral leg pain. The pain that she complains about most is with her first step in the morning. This is not consistent with varicose vein problems. She has normal arterial exam. I'm not convinced that most of her problems are vascular in nature. She does have evidence of some deep vein reflux and does have a few scattered varicosities on her lower extremities. Certainly her swelling problems in the fullness and achiness she feels as the day progresses could be related to  this. She is not a candidate for superficial venous ablation as she does not really have reflux in her superficial system. I believe the best option for now would be bilateral lower extremity compression stockings for symptomatic relief. She will follow-up with uKoreain 6 months time to see if she has made any progress.  CRuta Hinds MD [facility] GNoraOffice: [phone]: [phone]

## 2014-12-03 ENCOUNTER — Emergency Department (HOSPITAL_COMMUNITY)
Admission: EM | Admit: 2014-12-03 | Discharge: 2014-12-03 | Disposition: A | Discharge status: Home / Self Care | Payer: Medicaid Other | Attending: Emergency Medicine | Admitting: Emergency Medicine

## 2014-12-03 ENCOUNTER — Encounter (HOSPITAL_COMMUNITY): Payer: Self-pay | Admitting: *Deleted

## 2014-12-03 ENCOUNTER — Emergency Department (HOSPITAL_COMMUNITY): Payer: Medicaid Other

## 2014-12-03 DIAGNOSIS — Z79899 Other long term (current) drug therapy: Secondary | ICD-10-CM | POA: Insufficient documentation

## 2014-12-03 DIAGNOSIS — Z91040 Latex allergy status: Secondary | ICD-10-CM | POA: Insufficient documentation

## 2014-12-03 DIAGNOSIS — M549 Dorsalgia, unspecified: Secondary | ICD-10-CM | POA: Insufficient documentation

## 2014-12-03 DIAGNOSIS — Z720 Tobacco use: Secondary | ICD-10-CM | POA: Insufficient documentation

## 2014-12-03 DIAGNOSIS — R079 Chest pain, unspecified: Secondary | ICD-10-CM | POA: Diagnosis present

## 2014-12-03 DIAGNOSIS — J209 Acute bronchitis, unspecified: Secondary | ICD-10-CM | POA: Insufficient documentation

## 2014-12-03 DIAGNOSIS — Z880 Allergy status to penicillin: Secondary | ICD-10-CM | POA: Insufficient documentation

## 2014-12-03 DIAGNOSIS — J40 Bronchitis, not specified as acute or chronic: Secondary | ICD-10-CM

## 2014-12-03 LAB — BASIC METABOLIC PANEL
ANION GAP: 10 (ref 5–15)
BUN: 12 mg/dL (ref 6–23)
CHLORIDE: 104 mmol/L (ref 96–112)
CO2: 24 mmol/L (ref 19–32)
CREATININE: 0.88 mg/dL (ref 0.50–1.10)
Calcium: 9.6 mg/dL (ref 8.4–10.5)
GFR calc Af Amer: 81 mL/min — ABNORMAL LOW (ref >90)
GFR calc non Af Amer: 70 mL/min — ABNORMAL LOW (ref >90)
Glucose, Bld: 83 mg/dL (ref 70–99)
Potassium: 3.7 mmol/L (ref 3.5–5.1)
Sodium: 138 mmol/L (ref 135–145)

## 2014-12-03 LAB — I-STAT TROPONIN, ED: Troponin i, poc: 0 ng/mL (ref 0.00–0.08)

## 2014-12-03 LAB — CBC
HCT: 37.8 % (ref 36.0–46.0)
Hemoglobin: 12.4 g/dL (ref 12.0–15.0)
MCH: 26.8 pg (ref 26.0–34.0)
MCHC: 32.8 g/dL (ref 30.0–36.0)
MCV: 81.6 fL (ref 78.0–100.0)
PLATELETS: 286 10*3/uL (ref 150–400)
RBC: 4.63 MIL/uL (ref 3.87–5.11)
RDW: 13.5 % (ref 11.5–15.5)
WBC: 7.2 10*3/uL (ref 4.0–10.5)

## 2014-12-03 MED ORDER — ALBUTEROL SULFATE HFA 108 (90 BASE) MCG/ACT IN AERS: 2.0000 | INHALATION_SPRAY | RESPIRATORY_TRACT | Status: DC | PRN

## 2014-12-03 MED ORDER — IPRATROPIUM-ALBUTEROL 0.5-2.5 (3) MG/3ML IN SOLN
3.0000 mL | Freq: Once | RESPIRATORY_TRACT | Status: AC
  Administered 2014-12-03: 3 mL via RESPIRATORY_TRACT
  Filled 2014-12-03: qty 3

## 2014-12-03 MED ORDER — AZITHROMYCIN 250 MG PO TABS: 250.0000 mg | ORAL_TABLET | Freq: Every day | ORAL | Status: DC

## 2014-12-03 MED ORDER — BENZONATATE 200 MG PO CAPS: 200.0000 mg | ORAL_CAPSULE | Freq: Three times a day (TID) | ORAL | Status: DC | PRN

## 2014-12-03 NOTE — ED Notes (Signed)
Pt reports central non radiating chest pain since last night. Pt states pain increases with deep inspiration, also reports some n/v.

## 2014-12-03 NOTE — Discharge Instructions (Signed)
Take the medication as directed and follow up with your doctor. Return here as needed for worsening symptoms.

## 2014-12-03 NOTE — ED Provider Notes (Signed)
CSN: [id number]     Arrival date & time 12/03/14  1739 History   First MD Initiated Contact with Patient 12/03/14 1811     Chief Complaint  Patient presents with  . Chest Pain     (Consider location/radiation/quality/duration/timing/severity/associated sxs/prior Treatment) Patient is a 59 y.o. female presenting with chest pain. The history is provided by the patient.  Chest Pain Pain location:  L chest Pain quality: aching and sharp   Pain radiates to:  Mid back Pain radiates to the back: yes   Pain severity:  Moderate Onset quality:  Gradual Duration:  24 hours Timing:  Constant Progression:  Improving Chronicity:  New Worsened by:  Deep breathing and movement Associated symptoms: back pain, cough and shortness of breath  Vomiting: coughed untill vomited.    Tiffany Coleman is a 59 y.o. female who is an every day smoker for 20 years presents to the ED with chest pain. States she has not been feeling well for the past few weeks. She has had a headache, chest congestion and pain that radiates from her chest to her back. She has a productive cough and felt short of breath.. Reports swelling in her hands and feet and taking a "water pill" that her doctor had prescribed for her in the past and it helped.  Patient states she feels like she needs a breathing treatment.   History reviewed. No pertinent past medical history. Past Surgical History  Procedure Laterality Date  . Hernia repair    . Abdominal hysterectomy     Family History  Problem Relation Age of Onset  . Deep vein thrombosis Mother   . Diabetes Mother   . Hypertension Mother   . Varicose Veins Mother   . Cancer Father   . Hypertension Sister   . Hyperlipidemia Brother   . Hypertension Daughter   . Heart attack Son    History  Substance Use Topics  . Smoking status: Current Some Day Smoker -- 20 years    Types: Cigarettes  . Smokeless tobacco: Never Used     Comment: smokes 2-3 cigs per day   . Alcohol Use:  1.2 oz/week    2 Glasses of wine per week   OB History    No data available     Review of Systems  Respiratory: Positive for cough and shortness of breath.   Cardiovascular: Positive for chest pain.  Gastrointestinal: Vomiting: coughed untill vomited.  Musculoskeletal: Positive for back pain.  all other systems negative    Allergies  Latex; Penicillins; and Codeine  Home Medications   Prior to Admission medications   Medication Sig Start Date End Date Taking? Authorizing Provider  furosemide (LASIX) 20 MG tablet Take 0.5 tablets (10 mg total) by mouth daily. As needed for edema 10/11/13  Yes Linton Flemings, MD  traMADol (ULTRAM) 50 MG tablet Take 1 tablet (50 mg total) by mouth every 6 (six) hours as needed. 10/11/13  Yes Linton Flemings, MD  zolpidem (AMBIEN) 5 MG tablet Take 2.5-5 mg by mouth at bedtime.    Yes Historical Provider, MD  albuterol (PROVENTIL HFA;VENTOLIN HFA) 108 (90 BASE) MCG/ACT inhaler Inhale 2 puffs into the lungs every 4 (four) hours as needed for wheezing or shortness of breath. 12/03/14   Hope Bunnie Pion, NP  azithromycin (ZITHROMAX) 250 MG tablet Take 1 tablet (250 mg total) by mouth daily. Take first 2 tablets together, then 1 every day until finished. 12/03/14   Hope Bunnie Pion, NP  benzonatate (TESSALON) 200  MG capsule Take 1 capsule (200 mg total) by mouth 3 (three) times daily as needed for cough. 12/03/14   Hope Bunnie Pion, NP   BP 119/79 mmHg  Pulse 91  Temp(Src) 98.1 F (36.7 C) (Oral)  Resp 22  Ht _0  (1.6 m)  SpO2 100% Physical Exam  Constitutional: She is oriented to person, place, and time. She appears well-developed and well-nourished.  HENT:  Head: Normocephalic.  Eyes: EOM are normal.  Neck: Neck supple.  Cardiovascular: An irregular rhythm present. Bradycardia present.   Pulmonary/Chest: Effort normal. She has rales in the right lower field.  Abdominal: Soft. There is no tenderness.  Musculoskeletal: Normal range of motion.  Neurological: She is  alert and oriented to person, place, and time. No cranial nerve deficit.  Skin: Skin is warm and dry.  Psychiatric: She has a normal mood and affect. Her behavior is normal.  Nursing note and vitals reviewed.   ED Course  Procedures (including critical care time) Results for orders placed or performed during the hospital encounter of 12/03/14 (from the past 24 hour(s))  CBC     Status: None   Collection Time: 12/03/14  5:54 PM  Result Value Ref Range   WBC 7.2 4.0 - 10.5 K/uL   RBC 4.63 3.87 - 5.11 MIL/uL   Hemoglobin 12.4 12.0 - 15.0 g/dL   HCT 37.8 36.0 - 46.0 %   MCV 81.6 78.0 - 100.0 fL   MCH 26.8 26.0 - 34.0 pg   MCHC 32.8 30.0 - 36.0 g/dL   RDW 13.5 11.5 - 15.5 %   Platelets 286 150 - 400 K/uL  Basic metabolic panel     Status: Abnormal   Collection Time: 12/03/14  5:54 PM  Result Value Ref Range   Sodium 138 135 - 145 mmol/L   Potassium 3.7 3.5 - 5.1 mmol/L   Chloride 104 96 - 112 mmol/L   CO2 24 19 - 32 mmol/L   Glucose, Bld 83 70 - 99 mg/dL   BUN 12 6 - 23 mg/dL   Creatinine, Ser 0.88 0.50 - 1.10 mg/dL   Calcium 9.6 8.4 - 10.5 mg/dL   GFR calc non Af Amer 70 (L) >90 mL/min   GFR calc Af Amer 81 (L) >90 mL/min   Anion gap 10 5 - 15  I-stat troponin, ED (not at [facility])     Status: None   Collection Time: 12/03/14  6:03 PM  Result Value Ref Range   Troponin i, poc 0.00 0.00 - 0.08 ng/mL   Comment 3            Imaging Review Dg Chest 2 View  12/03/2014   CLINICAL DATA:  Chest congestion and pain which shortness-of-breath.  EXAM: CHEST  2 VIEW  COMPARISON:  10/11/2013  FINDINGS: Lungs are adequately inflated with subtle hazy opacification over the region of the right middle lobe/lingula on the lateral film which is not well seen on the frontal exam. No evidence of effusion. Cardiomediastinal silhouette and remainder of the exam is unchanged.  IMPRESSION: Mild hazy opacification over the region of the the right middle lobe/lingula on the lateral film which may be due to  atelectasis or infection.   Electronically Signed   By: Marin Olp M.D.   On: 12/03/2014 18:33     EKG Interpretation   Date/Time:  Thursday December 03 2014 17:46:17 EDT Ventricular Rate:  69 PR Interval:  182 QRS Duration: 80 QT Interval:  382 QTC Calculation: 409 R  Axis:   40 Text Interpretation:  Sinus rhythm with marked sinus arrhythmia Otherwise  normal ECG No significant change since last tracing Confirmed by KNAPP   MD-J, JON ([id number]) on 12/03/2014 6:02:27 PM     Patient feeling better after Albuterol/Atrovent treatment.  I discussed this case with Dr. Tomi Bamberger MDM  59 y.o. female with cough and congestion for the past few weeks and some shortness of breath that improved with neb treatment. Will treat with Albuterol Inhaler, Tessalon Perls and Z-pak. Patient to follow up with her PCP or return here for worsening symptoms. Stable for d/c without chest pain or shortness of breath at this time. O2 SAT 100% on R/A.    Final diagnoses:  [place], NP 12/04/14 0111  Dorie Rank, MD 12/06/14 [phone]

## 2014-12-03 NOTE — ED Notes (Signed)
Patient transported to X-ray 

## 2014-12-03 NOTE — ED Notes (Signed)
Pt states she has "not been feeling well" for the past few weeks, states her head has been hurting, reports chest congestion and some pain in chest that radiates to her back as well as some sob when she walks for the past few days. Pt reports coughing up lots of phlegm today. States she has had some swelling in her hands and feet, took a "water pill" that her daughter gave her which seemed to help. Pt alert, oriented, nad.

## 2015-01-12 ENCOUNTER — Other Ambulatory Visit: Payer: Self-pay | Admitting: Surgery

## 2015-01-12 DIAGNOSIS — R1011 Right upper quadrant pain: Secondary | ICD-10-CM

## 2015-01-21 ENCOUNTER — Other Ambulatory Visit: Payer: Medicaid Other

## 2015-01-25 ENCOUNTER — Other Ambulatory Visit: Payer: Medicaid Other

## 2015-03-16 ENCOUNTER — Other Ambulatory Visit: Payer: Self-pay | Admitting: Orthopaedic Surgery

## 2015-03-16 DIAGNOSIS — M25562 Pain in left knee: Secondary | ICD-10-CM

## 2015-03-31 ENCOUNTER — Encounter: Payer: Self-pay | Admitting: Family

## 2015-04-01 ENCOUNTER — Ambulatory Visit: Payer: Medicaid Other | Admitting: Vascular Surgery

## 2015-04-01 ENCOUNTER — Ambulatory Visit: Payer: Medicaid Other | Admitting: Family

## 2015-04-24 ENCOUNTER — Ambulatory Visit
Admission: RE | Admit: 2015-04-24 | Discharge: 2015-04-24 | Disposition: A | Discharge status: Home / Self Care | Payer: Medicaid Other | Source: Ambulatory Visit | Attending: Orthopaedic Surgery | Admitting: Orthopaedic Surgery

## 2015-04-24 DIAGNOSIS — M25562 Pain in left knee: Secondary | ICD-10-CM

## 2015-07-13 ENCOUNTER — Ambulatory Visit: Payer: Medicaid Other

## 2015-07-14 ENCOUNTER — Other Ambulatory Visit: Payer: Self-pay | Admitting: Physician Assistant

## 2015-07-14 DIAGNOSIS — M25511 Pain in right shoulder: Secondary | ICD-10-CM

## 2015-07-21 ENCOUNTER — Ambulatory Visit: Payer: Medicaid Other | Attending: Physician Assistant | Admitting: Physical Therapy

## 2015-07-21 DIAGNOSIS — R29898 Other symptoms and signs involving the musculoskeletal system: Secondary | ICD-10-CM | POA: Diagnosis present

## 2015-07-21 DIAGNOSIS — Z9889 Other specified postprocedural states: Secondary | ICD-10-CM | POA: Diagnosis present

## 2015-07-21 DIAGNOSIS — M25662 Stiffness of left knee, not elsewhere classified: Secondary | ICD-10-CM | POA: Diagnosis present

## 2015-07-21 DIAGNOSIS — R262 Difficulty in walking, not elsewhere classified: Secondary | ICD-10-CM | POA: Diagnosis present

## 2015-07-22 NOTE — Therapy (Addendum)
[place], [place], [address] Phone: [phone]   Fax:  [phone]  Physical Therapy Evaluation  Patient Details  Name: Tiffany Coleman MRN: [id number] Date of Birth: 02-Aug-1956 Referring Provider: Ninfa Linden  Encounter Date: 07/21/2015      PT End of Session - 07/22/15 1001    Visit Number 1   Number of Visits 4   Date for PT Re-Evaluation 08/25/15   PT Start Time 1300   PT Stop Time 1345   PT Time Calculation (min) 45 min   Activity Tolerance Patient tolerated treatment well;Patient limited by pain   Behavior During Therapy Anxious      No past medical history on file.  Past Surgical History  Procedure Laterality Date  . Hernia repair    . Abdominal hysterectomy      There were no vitals filed for this visit.  Visit Diagnosis:  S/P left knee arthroscopy - Plan: PT plan of care cert/re-cert  Stiffness of knee joint, left - Plan: PT plan of care cert/re-cert  Difficulty walking up stairs - Plan: PT plan of care cert/re-cert  Weakness of left leg - Plan: PT plan of care cert/re-cert      Subjective Assessment - 07/21/15 1307    Subjective Pt had knee scope 06/03/15 by Dr. Ninfa Linden.  She has pain in L post knee even at rest. She has diff walking, reports is "so afraid" to bend it.  She has difficulty transferring to stand from chair, tiolet, negotiating stairs and standing long periods in general.  She  denies weakness, stiffness dominates.  Stands at work 5-7 hours and wants to be able to work with better comfort.  She is pleased she had the surgery, pain reduction has been drastic.      Pertinent History has had previous surgeries per pt but no med records available.    Limitations Lifting;Standing;Walking;House hold activities;Other (comment);Sitting  Work    Patient Stated Goals Be able to move bettter   Currently in Pain? Yes   Pain Score 2   with standing    Pain Location Knee    Pain Orientation Left;Posterior   Pain Descriptors / Indicators Aching;Sore;Throbbing;Tightness   Pain Type Surgical pain;Chronic pain   Pain Onset More than a month ago   Pain Frequency Intermittent   Aggravating Factors  sit to stand, walking too much   Pain Relieving Factors rest, sometimes takes pain meds, ice   Effect of Pain on Daily Activities makes it difficult to work, walk around            [facility] PT Assessment - 07/21/15 1313    Assessment   Medical Diagnosis L knee    Referring Provider Ninfa Linden   Onset Date/Surgical Date 06/03/15   Prior Therapy No   Precautions   Precautions None   Restrictions   Weight Bearing Restrictions No   Balance Screen   Has the patient fallen in the past 6 months No   Has the patient had a decrease in activity level because of a fear of falling?  No   Is the patient reluctant to leave their home because of a fear of falling?  No   Home Social worker Private residence   Living Arrangements Other relatives   Home Access Stairs to enter   Entrance Stairs-Number of Steps [place] One level   Prior Function   Level of Independence Independent  Cognition   Overall Cognitive Status Within Functional Limits for tasks assessed   Observation/Other Assessments   Focus on Therapeutic Outcomes (FOTO)  NT   Observation/Other Assessments-Edema    Edema --  puffy superior-lateral to L patella   Sensation   Light Touch Appears Intact   Coordination   Gross Motor Movements are Fluid and Coordinated Not tested   Functional Tests   Functional tests Squat   Squat   Comments fearful, needs cues    Posture/Postural Control   Posture Comments genu valgus   AROM   Right Knee Flexion 126   Left Knee Extension 3   Left Knee Flexion 82  AAROM 102   Strength   Right Hip ABduction 4/5   Left Hip ABduction 4/5   Right Knee Flexion 4+/5   Right Knee Extension 4+/5   Left Knee Flexion 3+/5   fear, pain   Left Knee Extension 3+/5  fear, pain   Palpation   Patella mobility good   Palpation comment min tenderness    Transfers   Transfers Sit to Stand   Comments incr time and effort                   [facility] Adult PT Treatment/Exercise - 07/21/15 1344    Knee/Hip Exercises: Stretches   Knee: Self-Stretch to increase Flexion Left;3 reps;30 seconds   Knee/Hip Exercises: Aerobic   Recumbent Bike used bike to work on AROM, stiffness on L knee, did 5 min and became easier to do after 1-2 min    Vasopneumatic   Number Minutes Vasopneumatic  15 minutes   Vasopnuematic Location  Knee   Vasopneumatic Pressure Medium   Vasopneumatic Temperature  32                 PT Education - 07/22/15 1000    Education provided Yes   Education Details PT/POC, HEP, encouragement and safety with knee AROM    Person(s) Educated Patient   Methods Explanation;Demonstration;Verbal cues;Tactile cues;Handout   Comprehension Verbalized understanding;Need further instruction;Verbal cues required             PT Long Term Goals - 07/22/15 1013    PT LONG TERM GOAL #1   Title Patient will be I with HEP   Baseline given on eval   Time 5   Period Weeks   Status New   PT LONG TERM GOAL #2   Title Pt will understand RICE and concepts fof posture, body mechanics.    Baseline needs reinforcement   Time 5   Period Weeks   Status New   PT LONG TERM GOAL #3   Title Pt will be able to neogtiate steps with rail reciprocally and min pain overall   Baseline one at a time, pain and anxiety mod to severe   Time 5   Period Weeks   Status New   PT LONG TERM GOAL #4   Title Patient will be able to work for 3-4 hours at a time and report min discomfort overall    Baseline mod to severe difficulty   Time 5   Period Weeks   Status New               Plan - 07/22/15 1003    Clinical Impression Statement PT underwent L knee arthroscopy (CPT 29880) on 06/03/15 and is  experiencing pain, impaired transfers, walking and stair negotiation interfering with household, community mobility and work duties.     Pt will benefit  from skilled therapeutic intervention in order to improve on the following deficits Decreased range of motion;Abnormal gait;Difficulty walking;Increased fascial restricitons;Obesity;Pain;Impaired flexibility;Hypomobility;Decreased mobility;Decreased strength;Increased edema;Postural dysfunction   Rehab Potential Good   PT Frequency 1x / week   PT Duration 3 weeks  3 visits over 4-5 weeks   PT Treatment/Interventions ADLs/Self Care Home Management;Ultrasound;Neuromuscular re-education;Passive range of motion;Patient/family education;Scar mobilization;Gait training;Stair training;Functional mobility training;Electrical Stimulation;Therapeutic activities;Therapeutic exercise;Moist Heat;Balance training;Manual techniques;Vasopneumatic Device   PT Next Visit Plan assess HEP, progress ROM, modailities as needed   PT Home Exercise Plan level 1 knee   Consulted and Agree with Plan of Care Patient         Problem List Patient Active Problem List   Diagnosis Date Noted  . Recurrent ventral hernia 09/08/2013    Nyiah Pianka 07/22/2015, 12:04 PM  [place], [place], [address] Phone: [phone]   Fax:  [phone]  Name: Tiffany Coleman MRN: [id number] Date of Birth: 08-Apr-1956   Raeford Razor, PT 07/22/2015 12:04 PM Phone: [phone] Fax: [phone]

## 2015-07-27 ENCOUNTER — Ambulatory Visit
Admission: RE | Admit: 2015-07-27 | Discharge: 2015-07-27 | Disposition: A | Discharge status: Home / Self Care | Payer: Medicaid Other | Source: Ambulatory Visit | Attending: Physician Assistant | Admitting: Physician Assistant

## 2015-07-27 DIAGNOSIS — M25511 Pain in right shoulder: Secondary | ICD-10-CM

## 2015-08-02 ENCOUNTER — Emergency Department (HOSPITAL_COMMUNITY)
Admission: EM | Admit: 2015-08-02 | Discharge: 2015-08-02 | Disposition: A | Discharge status: Home / Self Care | Payer: Medicaid Other | Attending: Emergency Medicine | Admitting: Emergency Medicine

## 2015-08-02 ENCOUNTER — Emergency Department (HOSPITAL_COMMUNITY): Payer: Medicaid Other

## 2015-08-02 ENCOUNTER — Encounter (HOSPITAL_COMMUNITY): Payer: Self-pay | Admitting: Cardiology

## 2015-08-02 DIAGNOSIS — F17210 Nicotine dependence, cigarettes, uncomplicated: Secondary | ICD-10-CM | POA: Diagnosis not present

## 2015-08-02 DIAGNOSIS — J209 Acute bronchitis, unspecified: Secondary | ICD-10-CM | POA: Diagnosis not present

## 2015-08-02 DIAGNOSIS — Z79899 Other long term (current) drug therapy: Secondary | ICD-10-CM | POA: Diagnosis not present

## 2015-08-02 DIAGNOSIS — Z91040 Latex allergy status: Secondary | ICD-10-CM | POA: Insufficient documentation

## 2015-08-02 DIAGNOSIS — R079 Chest pain, unspecified: Secondary | ICD-10-CM | POA: Diagnosis present

## 2015-08-02 DIAGNOSIS — R062 Wheezing: Secondary | ICD-10-CM

## 2015-08-02 DIAGNOSIS — J40 Bronchitis, not specified as acute or chronic: Secondary | ICD-10-CM

## 2015-08-02 DIAGNOSIS — Z880 Allergy status to penicillin: Secondary | ICD-10-CM | POA: Diagnosis not present

## 2015-08-02 DIAGNOSIS — R059 Cough, unspecified: Secondary | ICD-10-CM

## 2015-08-02 DIAGNOSIS — R05 Cough: Secondary | ICD-10-CM

## 2015-08-02 LAB — BASIC METABOLIC PANEL
Anion gap: 7 (ref 5–15)
BUN: 12 mg/dL (ref 6–20)
CHLORIDE: 107 mmol/L (ref 101–111)
CO2: 26 mmol/L (ref 22–32)
CREATININE: 0.99 mg/dL (ref 0.44–1.00)
Calcium: 9.5 mg/dL (ref 8.9–10.3)
GFR calc Af Amer: >60 mL/min (ref >60)
GFR calc non Af Amer: >60 mL/min (ref >60)
Glucose, Bld: 89 mg/dL (ref 65–99)
Potassium: 4 mmol/L (ref 3.5–5.1)
SODIUM: 140 mmol/L (ref 135–145)

## 2015-08-02 LAB — CBC
HCT: 37.9 % (ref 36.0–46.0)
Hemoglobin: 12.1 g/dL (ref 12.0–15.0)
MCH: 27.3 pg (ref 26.0–34.0)
MCHC: 31.9 g/dL (ref 30.0–36.0)
MCV: 85.6 fL (ref 78.0–100.0)
PLATELETS: 199 10*3/uL (ref 150–400)
RBC: 4.43 MIL/uL (ref 3.87–5.11)
RDW: 13.6 % (ref 11.5–15.5)
WBC: 5.2 10*3/uL (ref 4.0–10.5)

## 2015-08-02 LAB — I-STAT TROPONIN, ED: Troponin i, poc: 0 ng/mL (ref 0.00–0.08)

## 2015-08-02 MED ORDER — AZITHROMYCIN 250 MG PO TABS: ORAL_TABLET | ORAL | Status: DC

## 2015-08-02 MED ORDER — ALBUTEROL SULFATE (2.5 MG/3ML) 0.083% IN NEBU
5.0000 mg | INHALATION_SOLUTION | Freq: Once | RESPIRATORY_TRACT | Status: AC
  Administered 2015-08-02: 5 mg via RESPIRATORY_TRACT
  Filled 2015-08-02: qty 6

## 2015-08-02 MED ORDER — ALBUTEROL SULFATE HFA 108 (90 BASE) MCG/ACT IN AERS: 2.0000 | INHALATION_SPRAY | RESPIRATORY_TRACT | Status: DC | PRN

## 2015-08-02 NOTE — Discharge Instructions (Signed)
It was our pleasure to provide your ER care today - we hope that you feel better.  Rest. Drink adequate fluids. Avoid smoking.  Take zithromax as prescribed.  Use albuterol inhaler as need.  You may try mucinex as need for symptom relief (available over the counter).  Follow up with primary care doctor in 1 week if symptoms fail to improve/resolve.  Return to ER if worse, new symptoms, difficulty breathing, recurrent/persistent chest pain, other concern.     Cough, Adult Coughing is a reflex that clears your throat and your airways. Coughing helps to heal and protect your lungs. It is normal to cough occasionally, but a cough that happens with other symptoms or lasts a long time may be a sign of a condition that needs treatment. A cough may last only 2-3 weeks (acute), or it may last longer than 8 weeks (chronic). CAUSES Coughing is commonly caused by:  Breathing in substances that irritate your lungs.  A viral or bacterial respiratory infection.  Allergies.  Asthma.  Postnasal drip.  Smoking.  Acid backing up from the stomach into the esophagus (gastroesophageal reflux).  Certain medicines.  Chronic lung problems, including COPD (or rarely, lung cancer).  Other medical conditions such as heart failure. HOME CARE INSTRUCTIONS  Pay attention to any changes in your symptoms. Take these actions to help with your discomfort:  Take medicines only as told by your health care provider.  If you were prescribed an antibiotic medicine, take it as told by your health care provider. Do not stop taking the antibiotic even if you start to feel better.  Talk with your health care provider before you take a cough suppressant medicine.  Drink enough fluid to keep your urine clear or pale yellow.  If the air is dry, use a cold steam vaporizer or humidifier in your bedroom or your home to help loosen secretions.  Avoid anything that causes you to cough at work or at home.  If  your cough is worse at night, try sleeping in a semi-upright position.  Avoid cigarette smoke. If you smoke, quit smoking. If you need help quitting, ask your health care provider.  Avoid caffeine.  Avoid alcohol.  Rest as needed. SEEK MEDICAL CARE IF:   You have new symptoms.  You cough up pus.  Your cough does not get better after 2-3 weeks, or your cough gets worse.  You cannot control your cough with suppressant medicines and you are losing sleep.  You develop pain that is getting worse or pain that is not controlled with pain medicines.  You have a fever.  You have unexplained weight loss.  You have night sweats. SEEK IMMEDIATE MEDICAL CARE IF:  You cough up blood.  You have difficulty breathing.  Your heartbeat is very fast.   This information is not intended to replace advice given to you by your health care provider. Make sure you discuss any questions you have with your health care provider.   Document Released: 01/27/2011 Document Revised: 04/21/2015 Document Reviewed: 10/07/2014 Elsevier Interactive Patient Education 2016 Elsevier Inc.    Acute Bronchitis Bronchitis is inflammation of the airways that extend from the windpipe into the lungs (bronchi). The inflammation often causes mucus to develop. This leads to a cough, which is the most common symptom of bronchitis.  In acute bronchitis, the condition usually develops suddenly and goes away over time, usually in a couple weeks. Smoking, allergies, and asthma can make bronchitis worse. Repeated episodes of bronchitis may cause  further lung problems.  CAUSES Acute bronchitis is most often caused by the same virus that causes a cold. The virus can spread from person to person (contagious) through coughing, sneezing, and touching contaminated objects. SIGNS AND SYMPTOMS   Cough.   Fever.   Coughing up mucus.   Body aches.   Chest congestion.   Chills.   Shortness of breath.   Sore throat.   DIAGNOSIS  Acute bronchitis is usually diagnosed through a physical exam. Your health care provider will also ask you questions about your medical history. Tests, such as chest X-rays, are sometimes done to rule out other conditions.  TREATMENT  Acute bronchitis usually goes away in a couple weeks. Oftentimes, no medical treatment is necessary. Medicines are sometimes given for relief of fever or cough. Antibiotic medicines are usually not needed but may be prescribed in certain situations. In some cases, an inhaler may be recommended to help reduce shortness of breath and control the cough. A cool mist vaporizer may also be used to help thin bronchial secretions and make it easier to clear the chest.  HOME CARE INSTRUCTIONS  Get plenty of rest.   Drink enough fluids to keep your urine clear or pale yellow (unless you have a medical condition that requires fluid restriction). Increasing fluids may help thin your respiratory secretions (sputum) and reduce chest congestion, and it will prevent dehydration.   Take medicines only as directed by your health care provider.  If you were prescribed an antibiotic medicine, finish it all even if you start to feel better.  Avoid smoking and secondhand smoke. Exposure to cigarette smoke or irritating chemicals will make bronchitis worse. If you are a smoker, consider using nicotine gum or skin patches to help control withdrawal symptoms. Quitting smoking will help your lungs heal faster.   Reduce the chances of another bout of acute bronchitis by washing your hands frequently, avoiding people with cold symptoms, and trying not to touch your hands to your mouth, nose, or eyes.   Keep all follow-up visits as directed by your health care provider.  SEEK MEDICAL CARE IF: Your symptoms do not improve after 1 week of treatment.  SEEK IMMEDIATE MEDICAL CARE IF:  You develop an increased fever or chills.   You have chest pain.   You have severe  shortness of breath.  You have bloody sputum.   You develop dehydration.  You faint or repeatedly feel like you are going to pass out.  You develop repeated vomiting.  You develop a severe headache. MAKE SURE YOU:   Understand these instructions.  Will watch your condition.  Will get help right away if you are not doing well or get worse.   This information is not intended to replace advice given to you by your health care provider. Make sure you discuss any questions you have with your health care provider.   Document Released: 09/07/2004 Document Revised: 08/21/2014 Document Reviewed: 01/21/2013 Elsevier Interactive Patient Education 2016 [employer].    Smoking Hazards Smoking cigarettes is extremely bad for your health. Tobacco smoke has over 200 known poisons in it. It contains the poisonous gases nitrogen oxide and carbon monoxide. There are over 60 chemicals in tobacco smoke that cause cancer. Some of the chemicals found in cigarette smoke include:   Cyanide.   Benzene.   Formaldehyde.   Methanol (wood alcohol).   Acetylene (fuel used in welding torches).   Ammonia.  Even smoking lightly shortens your life expectancy by several years.  You can greatly reduce the risk of medical problems for you and your family by stopping now. Smoking is the most preventable cause of death and disease in our society. Within days of quitting smoking, your circulation improves, you decrease the risk of having a heart attack, and your lung capacity improves. There may be some increased phlegm in the first few days after quitting, and it may take months for your lungs to clear up completely. Quitting for 10 years reduces your risk of developing lung cancer to almost that of a nonsmoker.  WHAT ARE THE RISKS OF SMOKING? Cigarette smokers have an increased risk of many serious medical problems, including:  Lung cancer.   Lung disease (such as pneumonia, bronchitis, and  emphysema).   Heart attack and chest pain due to the heart not getting enough oxygen (angina).   Heart disease and peripheral blood vessel disease.   Hypertension.   Stroke.   Oral cancer (cancer of the lip, mouth, or voice box).   Bladder cancer.   Pancreatic cancer.   Cervical cancer.   Pregnancy complications, including premature birth.   Stillbirths and smaller newborn babies, birth defects, and genetic damage to sperm.   Early menopause.   Lower estrogen level for women.   Infertility.   Facial wrinkles.   Blindness.   Increased risk of broken bones (fractures).   Senile dementia.   Stomach ulcers and internal bleeding.   Delayed wound healing and increased risk of complications during surgery. Because of secondhand smoke exposure, children of smokers have an increased risk of the following:   Sudden infant death syndrome (SIDS).   Respiratory infections.   Lung cancer.   Heart disease.   Ear infections.  WHY IS SMOKING ADDICTIVE? Nicotine is the chemical agent in tobacco that is capable of causing addiction or dependence. When you smoke and inhale, nicotine is absorbed rapidly into the bloodstream through your lungs. Both inhaled and noninhaled nicotine may be addictive.  WHAT ARE THE BENEFITS OF QUITTING?  There are many health benefits to quitting smoking. Some are:   The likelihood of developing cancer and heart disease decreases. Health improvements are seen almost immediately.   Blood pressure, pulse rate, and breathing patterns start returning to normal soon after quitting.   People who quit may see an improvement in their overall quality of life.  HOW DO YOU QUIT SMOKING? Smoking is an addiction with both physical and psychological effects, and longtime habits can be hard to change. Your health care provider can recommend:  Programs and community resources, which may include group support, education, or  therapy.  Replacement products, such as patches, gum, and nasal sprays. Use these products only as directed. Do not replace cigarette smoking with electronic cigarettes (commonly called e-cigarettes). The safety of e-cigarettes is unknown, and some may contain harmful chemicals. FOR MORE INFORMATION  American Lung Association: www.lung.org  American Cancer Society: www.cancer.org   This information is not intended to replace advice given to you by your health care provider. Make sure you discuss any questions you have with your health care provider.   Document Released: 09/07/2004 Document Revised: 05/21/2013 Document Reviewed: 01/20/2013 Elsevier Interactive Patient Education [employer].

## 2015-08-02 NOTE — ED Notes (Signed)
Pt reports chest pain that started a couple of days ago, also reports vomiting intermittently. Also has a cough, and some SOB.

## 2015-08-02 NOTE — ED Provider Notes (Signed)
CSN: [id number]     Arrival date & time 08/02/15  1603 History   First MD Initiated Contact with Patient 08/02/15 1758     Chief Complaint  Patient presents with  . Chest Pain     (Consider location/radiation/quality/duration/timing/severity/associated sxs/prior Treatment) Patient is a 59 y.o. female presenting with chest pain. The history is provided by the patient.  Chest Pain Associated symptoms: cough   Associated symptoms: no abdominal pain, no back pain, no fever, no headache, no palpitations and no shortness of breath   Patient c/o recent prod cough, nasal congestion/post nasal drainage, sore throat, in the past 2-3 days. States last evening at rest, continuing today, constant, at rest, mid chest soreness, mild-mod, persistent, worse w coughing. No known ill contacts. No exertional chest pain or discomfort. No associated nv, diaphoresis or sob.  No hx cad. No fam hx premature cad. Pt w recent uri contacts at work. +smoker. No recent abx use. Hx recurrent bronchitis.     History reviewed. No pertinent past medical history. Past Surgical History  Procedure Laterality Date  . Hernia repair    . Abdominal hysterectomy     Family History  Problem Relation Age of Onset  . Deep vein thrombosis Mother   . Diabetes Mother   . Hypertension Mother   . Varicose Veins Mother   . Cancer Father   . Hypertension Sister   . Hyperlipidemia Brother   . Hypertension Daughter   . Heart attack Son    Social History  Substance Use Topics  . Smoking status: Current Some Day Smoker -- 20 years    Types: Cigarettes  . Smokeless tobacco: Never Used     Comment: smokes 2-3 cigs per day   . Alcohol Use: 1.2 oz/week    2 Glasses of wine per week   OB History    No data available     Review of Systems  Constitutional: Negative for fever and chills.  HENT: Positive for congestion.   Eyes: Negative for discharge and redness.  Respiratory: Positive for cough. Negative for shortness of  breath.   Cardiovascular: Positive for chest pain. Negative for palpitations and leg swelling.  Gastrointestinal: Negative for abdominal pain.  Genitourinary: Negative for flank pain.  Musculoskeletal: Negative for back pain and neck pain.  Skin: Negative for rash.  Neurological: Negative for headaches.  Hematological: Does not bruise/bleed easily.  Psychiatric/Behavioral: Negative for confusion.      Allergies  Latex; Penicillins; and Codeine  Home Medications   Prior to Admission medications   Medication Sig Start Date End Date Taking? Authorizing Provider  albuterol (PROVENTIL HFA;VENTOLIN HFA) 108 (90 BASE) MCG/ACT inhaler Inhale 2 puffs into the lungs every 4 (four) hours as needed for wheezing or shortness of breath. 12/03/14  Yes Hope Bunnie Pion, NP  colchicine 0.6 MG tablet Take 1 tablet by mouth as needed. 05/31/15  Yes Historical Provider, MD  HYDROcodone-acetaminophen (NORCO/VICODIN) 5-325 MG tablet Take 1 tablet by mouth 2 (two) times daily as needed. 07/12/15  Yes Historical Provider, MD  pantoprazole (PROTONIX) 40 MG tablet Take 1 tablet by mouth daily. 05/31/15  Yes Historical Provider, MD  zolpidem (AMBIEN) 5 MG tablet Take 2.5-5 mg by mouth at bedtime.    Yes Historical Provider, MD  azithromycin (ZITHROMAX) 250 MG tablet Take 1 tablet (250 mg total) by mouth daily. Take first 2 tablets together, then 1 every day until finished. Patient not taking: Reported on 08/02/2015 12/03/14   Ashley Murrain, NP  benzonatate (  TESSALON) 200 MG capsule Take 1 capsule (200 mg total) by mouth 3 (three) times daily as needed for cough. Patient not taking: Reported on 07/21/2015 12/03/14   Ashley Murrain, NP  furosemide (LASIX) 20 MG tablet Take 0.5 tablets (10 mg total) by mouth daily. As needed for edema 10/11/13   Linton Flemings, MD  traMADol (ULTRAM) 50 MG tablet Take 1 tablet (50 mg total) by mouth every 6 (six) hours as needed. Patient not taking: Reported on 08/02/2015 10/11/13   Linton Flemings, MD    BP 139/91 mmHg  Pulse 78  Temp(Src) 98.1 F (36.7 C) (Oral)  Resp 18  Ht _0  (1.6 m)  Wt 94.348 kg  BMI 36.85 kg/m2  SpO2 95% Physical Exam  Constitutional: She appears well-developed and well-nourished. No distress.  HENT:  Mouth/Throat: Oropharynx is clear and moist.  Nasal congestion  Eyes: Conjunctivae are normal. No scleral icterus.  Neck: Neck supple. No tracheal deviation present.  No stiffness or rigidity  Cardiovascular: Normal rate, regular rhythm, normal heart sounds and intact distal pulses.  Exam reveals no gallop and no friction rub.   No murmur heard. Pulmonary/Chest: Effort normal. No respiratory distress. She exhibits tenderness.  Non prod cough, upper resp congestion.   Abdominal: Soft. Normal appearance and bowel sounds are normal. She exhibits no distension. There is no tenderness.  Genitourinary:  No cva tenderness  Musculoskeletal: She exhibits no edema or tenderness.  Neurological: She is alert.  Skin: Skin is warm and dry. No rash noted. She is not diaphoretic.  Psychiatric: She has a normal mood and affect.  Nursing note and vitals reviewed.   ED Course  Procedures (including critical care time) Labs Review    Results for orders placed or performed during the hospital encounter of 76/54/65  Basic metabolic panel  Result Value Ref Range   Sodium 140 135 - 145 mmol/L   Potassium 4.0 3.5 - 5.1 mmol/L   Chloride 107 101 - 111 mmol/L   CO2 26 22 - 32 mmol/L   Glucose, Bld 89 65 - 99 mg/dL   BUN 12 6 - 20 mg/dL   Creatinine, Ser 0.99 0.44 - 1.00 mg/dL   Calcium 9.5 8.9 - 10.3 mg/dL   GFR calc non Af Amer >60 >60 mL/min   GFR calc Af Amer >60 >60 mL/min   Anion gap 7 5 - 15  CBC  Result Value Ref Range   WBC 5.2 4.0 - 10.5 K/uL   RBC 4.43 3.87 - 5.11 MIL/uL   Hemoglobin 12.1 12.0 - 15.0 g/dL   HCT 37.9 36.0 - 46.0 %   MCV 85.6 78.0 - 100.0 fL   MCH 27.3 26.0 - 34.0 pg   MCHC 31.9 30.0 - 36.0 g/dL   RDW 13.6 11.5 - 15.5 %   Platelets  199 150 - 400 K/uL  I-stat troponin, ED (not at [facility])  Result Value Ref Range   Troponin i, poc 0.00 0.00 - 0.08 ng/mL   Comment 3           Dg Chest 2 View  08/02/2015  CLINICAL DATA:  Chest pain cough with shortness of breath EXAM: CHEST  2 VIEW COMPARISON:  12/03/2014 FINDINGS: Artifact overlies the left upper lobe externally. Low lung volumes evident. Normal heart size and vascularity. No focal pneumonia, collapse or consolidation. Negative for edema, effusion or pneumothorax. Trachea midline. Minor thoracic degenerative change. No significant interval change. IMPRESSION: Stable exam.  No interval change or acute process.  Electronically Signed   By: Jerilynn Mages.  Shick M.D.   On: 08/02/2015 16:23       I have personally reviewed and evaluated these images and lab results as part of my medical decision-making.   EKG Interpretation   Date/Time:  Monday August 02 2015 16:12:23 EST Ventricular Rate:  82 PR Interval:  166 QRS Duration: 78 QT Interval:  356 QTC Calculation: 415 R Axis:   54 Text Interpretation:  Normal sinus rhythm No significant change since last  tracing Confirmed by Ashok Cordia  MD, Lennette Bihari ([id number]) on 08/02/2015 5:59:50 PM      MDM   Cxr. Labs. Ecg.  Reviewed nursing notes and prior charts for additional history.   Albuterol neb, no wheezing on recheck. No increased wob. Non prod cough. Pt notes hx recurrent bronchitis, smoker, requests abx.   After symptoms persistent/constant for past couple days, trop neg/0.   Pt currently appears stable for d/c.     Lajean Saver, MD 08/02/15 [phone]

## 2015-08-02 NOTE — ED Notes (Signed)
Pt chief complaint changed rt pt not vomiting pt states she was puking mucous. Through further evaluation it is determined pt was coughing up mucous.

## 2015-08-04 ENCOUNTER — Ambulatory Visit: Payer: Medicaid Other | Admitting: Physical Therapy

## 2015-08-10 ENCOUNTER — Ambulatory Visit
Admission: RE | Admit: 2015-08-10 | Discharge: 2015-08-10 | Disposition: A | Discharge status: Home / Self Care | Payer: Medicaid Other | Source: Ambulatory Visit | Attending: Surgery | Admitting: Surgery

## 2015-08-10 DIAGNOSIS — R1011 Right upper quadrant pain: Secondary | ICD-10-CM

## 2015-08-11 ENCOUNTER — Ambulatory Visit: Payer: Medicaid Other | Admitting: Physical Therapy

## 2015-08-11 DIAGNOSIS — Z9889 Other specified postprocedural states: Secondary | ICD-10-CM | POA: Diagnosis not present

## 2015-08-11 DIAGNOSIS — R29898 Other symptoms and signs involving the musculoskeletal system: Secondary | ICD-10-CM

## 2015-08-11 DIAGNOSIS — R262 Difficulty in walking, not elsewhere classified: Secondary | ICD-10-CM

## 2015-08-11 DIAGNOSIS — M25662 Stiffness of left knee, not elsewhere classified: Secondary | ICD-10-CM

## 2015-08-12 NOTE — Therapy (Addendum)
[place], [place], [address] Phone: [phone]   Fax:  [phone]  Physical Therapy Treatment/Discharge  Patient Details  Name: Tiffany Coleman MRN: [id number] Date of Birth: [birth year] Referring Provider: Ninfa Linden  Encounter Date: 08/11/2015      PT End of Session - 08/11/15 1201    Visit Number 2   Number of Visits 4   Date for PT Re-Evaluation 08/25/15   PT Start Time 7893   PT Stop Time 1230   PT Time Calculation (min) 43 min      No past medical history on file.  Past Surgical History  Procedure Laterality Date  . Hernia repair    . Abdominal hysterectomy      There were no vitals filed for this visit.  Visit Diagnosis:  S/P left knee arthroscopy  Stiffness of knee joint, left  Difficulty walking up stairs  Weakness of left leg      Subjective Assessment - 08/11/15 1201    Currently in Pain? Yes   Pain Score 5    Pain Location Knee   Pain Orientation Left;Posterior   Pain Descriptors / Indicators Throbbing   Aggravating Factors  working                         [facility] Adult PT Treatment/Exercise - 08/12/15 0001    Knee/Hip Exercises: Stretches   Active Hamstring Stretch 3 reps;30 seconds   Knee/Hip Exercises: Aerobic   Recumbent Bike Rec bike rocking and then able to make full revolutions x 7 minutes   Modalities   Modalities Ultrasound   Ultrasound   Ultrasound Location Posterior knee/ distal hamstrings   Ultrasound Parameters 1.6 w/cm2, 1 MHZ, 100% x 8 minutes   Manual Therapy   Manual Therapy Soft tissue mobilization   Soft tissue mobilization Posterior knee at distal hamstrings-                     PT Long Term Goals - 07/22/15 1013    PT LONG TERM GOAL #1   Title Patient will be I with HEP   Baseline given on eval   Time 5   Period Weeks   Status New   PT LONG TERM GOAL #2   Title Pt will understand RICE and concepts fof  posture, body mechanics.    Baseline needs reinforcement   Time 5   Period Weeks   Status New   PT LONG TERM GOAL #3   Title Pt will be able to neogtiate steps with rail reciprocally and min pain overall   Baseline one at a time, pain and anxiety mod to severe   Time 5   Period Weeks   Status New   PT LONG TERM GOAL #4   Title Patient will be able to work for 3-4 hours at a time and report min discomfort overall    Baseline mod to severe difficulty   Time 5   Period Weeks   Status New               Plan - 08/11/15 0906    Clinical Impression Statement Pt reports most difficulty with pain at posterior knee. Very tender and feels extreme pain with any knee flexion movements. After warm up on bike pt instructed in various hamstring stretches which she reports significantly decreased her pain. Also used continuous ultrasound and soft tissue work at distal hamstrings today. Pt reports significant reduction  of pain post treatment. Encouraged pt to stretch, massage herself and use heat.    PT Next Visit Plan assess HEP, progress ROM, modailities as needed- check posterior knee pain and continue manual/ [place] as needed        Problem List Patient Active Problem List   Diagnosis Date Noted  . Recurrent ventral hernia 09/08/2013    Dorene Ar, PTA 08/12/2015, 9:13 AM  [place], [place], [address] Phone: [phone]   Fax:  [phone]  Name: Tiffany Coleman MRN: [id number] Date of Birth: Feb 12, 1956  PHYSICAL THERAPY DISCHARGE SUMMARY  Visits from Start of Care: 2  Current functional level related to goals / functional outcomes: Unknown, did not return   Remaining deficits: Unknown, did not return    Education / Equipment: HEP, RICE, massage Plan: Patient agrees to discharge.  Patient goals were not met. Patient is being discharged due to not returning since the last visit.  ?????     Raeford Razor, PT 07/19/16 1:44 PM Phone: [phone] Fax: [phone]

## 2015-08-15 HISTORY — PX: KNEE SURGERY: SHX244

## 2015-08-18 ENCOUNTER — Ambulatory Visit: Payer: Medicaid Other

## 2015-09-09 ENCOUNTER — Other Ambulatory Visit: Payer: Self-pay | Admitting: Orthopaedic Surgery

## 2015-10-19 ENCOUNTER — Other Ambulatory Visit: Payer: Self-pay

## 2015-10-19 DIAGNOSIS — Z1231 Encounter for screening mammogram for malignant neoplasm of breast: Secondary | ICD-10-CM

## 2015-11-03 ENCOUNTER — Ambulatory Visit
Admission: RE | Admit: 2015-11-03 | Discharge: 2015-11-03 | Disposition: A | Discharge status: Home / Self Care | Payer: Medicaid Other | Source: Ambulatory Visit

## 2015-11-03 DIAGNOSIS — Z1231 Encounter for screening mammogram for malignant neoplasm of breast: Secondary | ICD-10-CM

## 2016-02-08 ENCOUNTER — Other Ambulatory Visit: Payer: Self-pay | Admitting: Orthopaedic Surgery

## 2016-02-08 DIAGNOSIS — M25561 Pain in right knee: Secondary | ICD-10-CM

## 2016-02-16 ENCOUNTER — Other Ambulatory Visit: Payer: Medicaid Other

## 2016-03-21 ENCOUNTER — Ambulatory Visit
Admission: RE | Admit: 2016-03-21 | Discharge: 2016-03-21 | Disposition: A | Discharge status: Home / Self Care | Payer: Medicaid Other | Source: Ambulatory Visit | Attending: Orthopaedic Surgery | Admitting: Orthopaedic Surgery

## 2016-03-21 DIAGNOSIS — M25561 Pain in right knee: Secondary | ICD-10-CM

## 2016-05-31 ENCOUNTER — Ambulatory Visit: Payer: Medicaid Other

## 2016-06-02 ENCOUNTER — Ambulatory Visit (HOSPITAL_COMMUNITY)
Admission: RE | Admit: 2016-06-02 | Discharge: 2016-06-02 | Disposition: A | Discharge status: Home / Self Care | Payer: Medicaid Other | Source: Ambulatory Visit | Attending: Internal Medicine | Admitting: Internal Medicine

## 2016-06-02 ENCOUNTER — Encounter: Payer: Self-pay | Admitting: Internal Medicine

## 2016-06-02 ENCOUNTER — Ambulatory Visit (INDEPENDENT_AMBULATORY_CARE_PROVIDER_SITE_OTHER): Payer: Self-pay | Admitting: Internal Medicine

## 2016-06-02 VITALS — BP 154/85 | HR 68 | Temp 97.9°F | Ht 63.0 in | Wt 216.8 lb

## 2016-06-02 DIAGNOSIS — G4733 Obstructive sleep apnea (adult) (pediatric): Secondary | ICD-10-CM

## 2016-06-02 DIAGNOSIS — Z86010 Personal history of colonic polyps: Secondary | ICD-10-CM

## 2016-06-02 DIAGNOSIS — F17210 Nicotine dependence, cigarettes, uncomplicated: Secondary | ICD-10-CM

## 2016-06-02 DIAGNOSIS — K5900 Constipation, unspecified: Secondary | ICD-10-CM

## 2016-06-02 DIAGNOSIS — F1991 Other psychoactive substance use, unspecified, in remission: Secondary | ICD-10-CM | POA: Insufficient documentation

## 2016-06-02 DIAGNOSIS — R001 Bradycardia, unspecified: Secondary | ICD-10-CM | POA: Insufficient documentation

## 2016-06-02 DIAGNOSIS — R0602 Shortness of breath: Secondary | ICD-10-CM

## 2016-06-02 DIAGNOSIS — R002 Palpitations: Secondary | ICD-10-CM | POA: Insufficient documentation

## 2016-06-02 DIAGNOSIS — Z87898 Personal history of other specified conditions: Secondary | ICD-10-CM | POA: Insufficient documentation

## 2016-06-02 DIAGNOSIS — Z7251 High risk heterosexual behavior: Secondary | ICD-10-CM

## 2016-06-02 DIAGNOSIS — F1920 Other psychoactive substance dependence, uncomplicated: Secondary | ICD-10-CM

## 2016-06-02 HISTORY — DX: Obstructive sleep apnea (adult) (pediatric): G47.33

## 2016-06-02 LAB — TROPONIN I: Troponin I: <0.03 ng/mL (ref <0.03)

## 2016-06-02 NOTE — Progress Notes (Signed)
   CC: palpitations  HPI:  [name] is a 60 y.o. with a PMH of OSA, colonic polyps, intestinal obstruction s/p partial colectomy, h/o cocaine and marijuana abuse presenting for establishing of care and evaluation of episodic palpitations.  Palpitations have been occurring for about 4 months, will occur intermittently about once per week, last <1 minute, and are associated with mild chest tightness, shortness of breath, and LE swelling for a day after the episodes. Episodes are not associated with headache, vision or hearing changes. She denies syncopal episodes. She does have intermittent L arm pain and tingling that is intermittent and present even without the episodes of palpitations. Her son died at age 10 from sudden cardiac arrest while in prison; she was told his heart was enlarged.   Patient used to have medicaid and was with Dr. Annamarie Major previously. She lost Medicaid about a year ago. She is supposed to have a f/u colonoscopy as 2015 colonoscopy revealed 7 polyps. When she lost her Medicaid, she was also in the process of getting a CPAP fitted, but was unable to follow through so she has been untreated.   Patient signed record release form to obtain records from previous PCP.  Please see problem based Assessment and Plan for status of patients chronic conditions.  Past Medical History:  Diagnosis Date  . Hx of colonic polyps 2015  . Obstructive sleep apnea 06/02/2016   No CPAP yet  . Recurrent ventral hernia 09/08/2013    Review of Systems:   Review of Systems  Constitutional: Negative for chills and fever.  HENT: Negative for hearing loss.   Eyes: Negative for blurred vision and double vision.  Respiratory: Positive for shortness of breath (with palpitations). Negative for cough.   Cardiovascular: Positive for palpitations and leg swelling (after episodes of palpitations). Negative for chest pain and orthopnea.  Gastrointestinal: Positive for abdominal pain and  constipation. Negative for blood in stool, diarrhea, melena, nausea and vomiting.  Neurological: Positive for tingling (left arm). Negative for dizziness and headaches.    Physical Exam:  Vitals:   06/02/16 1128  BP: (!) 154/85  Pulse: 68  Temp: 97.9 F (36.6 C)  SpO2: 98%  Weight: 216 lb 12.8 oz (98.3 kg)  Height: _0  (1.6 m)   Physical Exam  Constitutional: NAD, pleasant CV: RRR, no murmurs, rubs or gallops, minimal bil LE edema, pulses intact throughout Resp: CTAB, no increased work of breathing, no wheezing or crackles appreciated Abd: distended, obese, soft, +BS, mildly tender in upper quadrants MSK: nonlocalized lower cervical and paraspinal tenderness; UE strength intact; negative tinnel's sign on L  Assessment & Plan:   See Encounters Tab for problem based charting.   Patient seen with Dr. Angelia Mould   Alphonzo Grieve, MD Internal Medicine PGY1

## 2016-06-02 NOTE — Patient Instructions (Signed)
We are checking your blood work today and are going to let you know about the results.   Your EKG looked ok.  For your constipation, take miralax and colace once a day; the goal is to have soft bowel movements about every day without straining.   We will see you in about 2 weeks to see how you're doing. Please call the clinic to set up this appointment.

## 2016-06-03 DIAGNOSIS — Z86010 Personal history of colonic polyps: Secondary | ICD-10-CM | POA: Insufficient documentation

## 2016-06-03 DIAGNOSIS — K5900 Constipation, unspecified: Secondary | ICD-10-CM | POA: Insufficient documentation

## 2016-06-03 DIAGNOSIS — Z860101 Personal history of adenomatous and serrated colon polyps: Secondary | ICD-10-CM | POA: Insufficient documentation

## 2016-06-03 LAB — CMP14 + ANION GAP
A/G RATIO: 1.6 (ref 1.2–2.2)
ALK PHOS: 74 IU/L (ref 39–117)
ALT: 17 IU/L (ref 0–32)
AST: 23 IU/L (ref 0–40)
Albumin: 4.2 g/dL (ref 3.6–4.8)
Anion Gap: 13 mmol/L (ref 10.0–18.0)
BUN/Creatinine Ratio: 18 (ref 12–28)
BUN: 14 mg/dL (ref 8–27)
Bilirubin Total: 0.6 mg/dL (ref 0.0–1.2)
CO2: 28 mmol/L (ref 18–29)
Calcium: 9.9 mg/dL (ref 8.7–10.3)
Chloride: 101 mmol/L (ref 96–106)
Creatinine, Ser: 0.79 mg/dL (ref 0.57–1.00)
GFR calc Af Amer: 93 mL/min/{1.73_m2} (ref >59)
GFR, EST NON AFRICAN AMERICAN: 80 mL/min/{1.73_m2} (ref >59)
GLOBULIN, TOTAL: 2.7 g/dL (ref 1.5–4.5)
Glucose: 89 mg/dL (ref 65–99)
POTASSIUM: 4.1 mmol/L (ref 3.5–5.2)
SODIUM: 142 mmol/L (ref 134–144)
Total Protein: 6.9 g/dL (ref 6.0–8.5)

## 2016-06-03 LAB — T4, FREE: FREE T4: 1.17 ng/dL (ref 0.82–1.77)

## 2016-06-03 LAB — HIV ANTIBODY (ROUTINE TESTING W REFLEX): HIV SCREEN 4TH GENERATION: NONREACTIVE

## 2016-06-03 LAB — HEPATITIS C ANTIBODY

## 2016-06-03 LAB — CBC
Hematocrit: 36.4 % (ref 34.0–46.6)
Hemoglobin: 12.4 g/dL (ref 11.1–15.9)
MCH: 27.8 pg (ref 26.6–33.0)
MCHC: 34.1 g/dL (ref 31.5–35.7)
MCV: 82 fL (ref 79–97)
PLATELETS: 260 10*3/uL (ref 150–379)
RBC: 4.46 x10E6/uL (ref 3.77–5.28)
RDW: 13.7 % (ref 12.3–15.4)
WBC: 7.1 10*3/uL (ref 3.4–10.8)

## 2016-06-03 LAB — TSH: TSH: 0.708 u[IU]/mL (ref 0.450–4.500)

## 2016-06-03 LAB — T3, FREE: T3 FREE: 2.8 pg/mL (ref 2.0–4.4)

## 2016-06-03 NOTE — Assessment & Plan Note (Addendum)
Patient with history of colonic polyps on 2015 colonoscopy and was supposed to have f/u colonoscopy in 2016. Pt is interested in obtaining [employer]. She endorses constipation, but denies BRBPR or melena.  Plan: --will need referral for colonoscopy once pt has orange card

## 2016-06-03 NOTE — Assessment & Plan Note (Addendum)
Patient endorses one BM per week with straining, abdominal distention, and bloating. Does not use stool softeners. Denies blood or melena.   Plan: --Miralax and colace up to BID PRN

## 2016-06-03 NOTE — Assessment & Plan Note (Addendum)
Patient with recurrent episodes of palpitations associated with shortness of breath consistent with intermittent arrhythmia; most common arrhythmia would be A fib. She does not have symptoms of hyperthyroidism which is also associated with inducing arrhythmia.  Plan: --EKG in office - I personally reviewed EKG, which shows sinus arrhythmia, otherwise no change from previous EKG --TSH, T4, T3 - unremarkable --CMP, CBC - unremarkable --would like to have 30 day cardiac monitor for evaluation of heart rhythm during palpitations, but patient is without insurance and in poor financial standing; will pursue after pt obtains orange card --f/u in 2 weeks

## 2016-06-05 NOTE — Progress Notes (Signed)
Internal Medicine Clinic Attending  I saw and evaluated the patient.  I personally confirmed the key portions of the history and exam documented by Dr. Jari Favre and I reviewed pertinent patient test results.  The assessment, diagnosis, and plan were formulated together and I agree with the documentation in the resident's note. Her history of untreated OSA does put her at increased risk for A. Fib.  We will have her obtain orange card and then proceed with cardiac monitoring.

## 2016-06-16 ENCOUNTER — Ambulatory Visit: Payer: Medicaid Other

## 2016-06-19 ENCOUNTER — Encounter: Payer: Self-pay | Admitting: Internal Medicine

## 2016-06-19 ENCOUNTER — Ambulatory Visit (INDEPENDENT_AMBULATORY_CARE_PROVIDER_SITE_OTHER): Payer: Self-pay | Admitting: Internal Medicine

## 2016-06-19 ENCOUNTER — Ambulatory Visit (HOSPITAL_COMMUNITY)
Admission: RE | Admit: 2016-06-19 | Discharge: 2016-06-19 | Disposition: A | Discharge status: Home / Self Care | Payer: Self-pay | Source: Ambulatory Visit | Attending: Internal Medicine | Admitting: Internal Medicine

## 2016-06-19 ENCOUNTER — Other Ambulatory Visit: Payer: Self-pay | Admitting: Internal Medicine

## 2016-06-19 ENCOUNTER — Ambulatory Visit: Payer: Medicaid Other

## 2016-06-19 VITALS — BP 136/85 | HR 71 | Temp 98.0°F | Ht 63.0 in | Wt 216.3 lb

## 2016-06-19 DIAGNOSIS — R002 Palpitations: Secondary | ICD-10-CM | POA: Insufficient documentation

## 2016-06-19 DIAGNOSIS — R202 Paresthesia of skin: Secondary | ICD-10-CM

## 2016-06-19 DIAGNOSIS — R200 Anesthesia of skin: Secondary | ICD-10-CM

## 2016-06-19 MED ORDER — NAPROXEN 500 MG PO TBEC: 500.0000 mg | DELAYED_RELEASE_TABLET | Freq: Three times a day (TID) | ORAL | 1 refills | Status: DC

## 2016-06-19 MED ORDER — NAPROXEN 250 MG PO TABS: 250.0000 mg | ORAL_TABLET | Freq: Three times a day (TID) | ORAL | 1 refills | Status: DC

## 2016-06-19 MED ORDER — OMEPRAZOLE 20 MG PO CPDR: 20.0000 mg | DELAYED_RELEASE_CAPSULE | Freq: Every day | ORAL | 1 refills | Status: DC

## 2016-06-19 NOTE — Patient Instructions (Addendum)
Tiffany Coleman,  It was a pleasure meeting you today Please take naproxen 532m three times a day for 1-2 weeks before meals for your arm pain Omeprazole has been sent to your pharmacy for your acid reflux Your cardiac monitor has been ordered Please return to the clinic if her symptoms worsen or do not get better

## 2016-06-19 NOTE — Progress Notes (Signed)
   CC: Left arm numbness and tingling  HPI:  [name] is a 60 y.o. woman with history noted below the presents to the internal medicine clinic for left arm tingling and numbness.  She states that the arm tingling and numbness started 3 weeks ago. It comes and goes and lasts for about 15-20 minutes at a time. She has some associated arm pain on the upper arm. She denies any chest pain shortness of breath or arm weakness. She denies any injury or surgery to the left arm.  She was previously seen in clinic at the end of October for heart palpitations and shortness of breath.  She states she is still having intermittent palpitations and now that she has gotten the orange card would like to be placed on a cardiac monitor that was discussed on the previous visit.     Past Medical History:  Diagnosis Date  . Hx of colonic polyps 2015  . Obstructive sleep apnea 06/02/2016   No CPAP yet  . Recurrent ventral hernia 09/08/2013    Review of Systems:  Review of Systems  Eyes: Negative for blurred vision.  Respiratory: Negative for shortness of breath.   Cardiovascular: Negative for chest pain.  Gastrointestinal: Negative for nausea and vomiting.  Neurological: Positive for tingling.    Physical Exam:  Vitals:   06/19/16 1544  BP: 136/85  Pulse: 71  Temp: 98 F (36.7 C)  TempSrc: Oral  SpO2: 100%  Weight: 216 lb 4.8 oz (98.1 kg)  Height: _0  (1.6 m)   Physical Exam  Neck:  Restricted range of motion on left and right rotation  Cardiovascular: Normal rate, regular rhythm and normal heart sounds.  Exam reveals no gallop and no friction rub.   No murmur heard. Pulmonary/Chest: Effort normal. No respiratory distress. She has no wheezes. She has no rales.  Musculoskeletal: She exhibits no edema.  5/5 motor strength in upper extremities bilaterally Sensation intact to touch on upper extremities bilaterally Cervical paraspinal musculature tender to palpation  Left lateral arm  pain on abduction of left arm  Skin: Skin is warm and dry.    Assessment & Plan:   See encounters tab for problem based medical decision making.   Patient seen with Dr. Evette Doffing

## 2016-06-21 DIAGNOSIS — R200 Anesthesia of skin: Secondary | ICD-10-CM | POA: Insufficient documentation

## 2016-06-21 DIAGNOSIS — R202 Paresthesia of skin: Secondary | ICD-10-CM

## 2016-06-21 NOTE — Assessment & Plan Note (Signed)
Assessment:  Palpitations Patient was seen on 10/24 for recurrent episodes of palpitations thought to be due to an arrythmia. She had an EKG done in office that showed sinus arrhythmia. Her TSH, CMP, CBC was unremarkable. Patient does not have insurance and is applying for the orange card. On previous visit it was thought to be beneficial to do a 30 day cardiac monitor.  Today patient had an EKG in office which showed normal sinus rhythm.  We will order a 30 day cardiac monitor for this visit  Plan - Cardiac Monitor

## 2016-06-21 NOTE — Assessment & Plan Note (Signed)
Assessment: Left arm tingling Patient developed new onset arm tingling and numbness 3 weeks ago.  Only associated symptom is pain to the left upper lateral arm.  On exam there was tenderness to the subacromial joint.  The arm tingling and pain the patient is experiencing is likely from a subacromial bursitis.  Will treat with an NSAID for 1-2 weeks.  Discussed the option of steroid injection but patient declined at this time.  Plan - Naproxen 529m TID for 1-2 weeks - Omeprazole

## 2016-06-21 NOTE — Progress Notes (Signed)
Internal Medicine Clinic Attending  I saw and evaluated the patient.  I personally confirmed the key portions of the history and exam documented by Dr. Hoffman and I reviewed pertinent patient test results.  The assessment, diagnosis, and plan were formulated together and I agree with the documentation in the resident's note.      

## 2016-06-30 ENCOUNTER — Ambulatory Visit: Payer: Self-pay

## 2016-10-02 ENCOUNTER — Ambulatory Visit: Payer: Self-pay

## 2016-10-04 ENCOUNTER — Ambulatory Visit (INDEPENDENT_AMBULATORY_CARE_PROVIDER_SITE_OTHER): Payer: Self-pay | Admitting: Internal Medicine

## 2016-10-04 ENCOUNTER — Encounter: Payer: Self-pay | Admitting: Internal Medicine

## 2016-10-04 ENCOUNTER — Encounter (INDEPENDENT_AMBULATORY_CARE_PROVIDER_SITE_OTHER): Payer: Self-pay

## 2016-10-04 DIAGNOSIS — R200 Anesthesia of skin: Secondary | ICD-10-CM

## 2016-10-04 DIAGNOSIS — R202 Paresthesia of skin: Secondary | ICD-10-CM

## 2016-10-04 DIAGNOSIS — Z8742 Personal history of other diseases of the female genital tract: Secondary | ICD-10-CM

## 2016-10-04 DIAGNOSIS — F411 Generalized anxiety disorder: Secondary | ICD-10-CM

## 2016-10-04 DIAGNOSIS — Z1272 Encounter for screening for malignant neoplasm of vagina: Secondary | ICD-10-CM

## 2016-10-04 DIAGNOSIS — Z90711 Acquired absence of uterus with remaining cervical stump: Secondary | ICD-10-CM

## 2016-10-04 DIAGNOSIS — Z01419 Encounter for gynecological examination (general) (routine) without abnormal findings: Secondary | ICD-10-CM

## 2016-10-04 MED ORDER — NAPROXEN 500 MG PO TBEC: 500.0000 mg | DELAYED_RELEASE_TABLET | Freq: Three times a day (TID) | ORAL | 1 refills | Status: DC

## 2016-10-04 MED ORDER — SERTRALINE HCL 25 MG PO TABS
25.0000 mg | ORAL_TABLET | Freq: Every day | ORAL | 0 refills | Status: DC
Start: 2016-10-04 — End: 2017-05-29

## 2016-10-04 NOTE — Patient Instructions (Addendum)
It was a pleasure to meet you today Tiffany Coleman,   Someone from this clinic will call you with the results of your pap smear   For your shoulder pain - I have given you a printed prescription for the pain medication naproxen so that you can figure out where it will be most affordable   For your anxiety - START taking Sertraline 25 mg daily   Please schedule a follow up visit in 1 month

## 2016-10-05 ENCOUNTER — Encounter: Payer: Self-pay | Admitting: Internal Medicine

## 2016-10-05 DIAGNOSIS — F411 Generalized anxiety disorder: Secondary | ICD-10-CM | POA: Insufficient documentation

## 2016-10-05 LAB — CYTOLOGY - PAP: Diagnosis: NEGATIVE

## 2016-10-05 NOTE — Assessment & Plan Note (Addendum)
She reports that she has continued to have tingling and numbness in her arm since the time of last visit in November. Evaluation at that time she was assessed to have subacromial bursitis and was prescribed naproxen. She says that the naproxen was not affordable. I provided her with a printed prescription for naproxen today and advised her this is on the $4 list at [facility].

## 2016-10-05 NOTE — Assessment & Plan Note (Addendum)
-  Performed Pap smear today.   Addendum: Pap smear results showed adequate inclusion of the transition zone with no malignant cells present.

## 2016-10-05 NOTE — Assessment & Plan Note (Signed)
She also has concerns for new anxiety attacks which have been going on for the past 2 years. They mostly happen at work she develops chest tightness and feels flustered. They last for 20 minutes and seemed to be triggered by stress. She also describes generalized anxiety throughout the day with irritability and restlessness sometimes accompanied by tearfulness.   She has a score of 15 on GAD 7 survey. I believe she has severe generalized anxiety and would benefit from beginning treatment. I believe that her anxiety attacks are triggered and are not panic attacks.  -prescribed sertraline 25 mg daily -Return to clinic in one month for follow-up

## 2016-10-05 NOTE — Progress Notes (Addendum)
   CC: anxiety   HPI: [name] is a 61 y.o. with past medical history as outlined below who presents to clinic with concerns for new anxiety and for Pap smear.  She is s/p partial abdominal hysterectomy for fibroids. She denies vaginal discharge or bleeding. She has no history of abnormal Pap smear. She has no history of STDs. She is not currently sexually active.  She also has concerns for new anxiety attacks which have been going on for the past 2 years. They mostly happen at work she develops chest tightness and feels flustered. They last for 20 minutes and seemed to be triggered by stress. She also describes generalized anxiety throughout the day with irritability and restlessness sometimes accompanied by tearfulness.   Please see problem list for status of the pt's chronic medical problems.  Past Medical History:  Diagnosis Date  . Hx of colonic polyps 2015  . Obstructive sleep apnea 06/02/2016   No CPAP yet  . Recurrent ventral hernia 09/08/2013    Review of Systems:  Please see each problem below for a pertinent review of systems.  Physical Exam:  Vitals:   10/04/16 0858  BP: 125/78  Pulse: 84  Temp: 98.7 F (37.1 C)  TempSrc: Oral  SpO2: 100%  Weight: 214 lb (97.1 kg)  Height: _0  (1.6 m)   Physical Exam  Constitutional: She appears well-developed and well-nourished. No distress.  HENT:  Head: Normocephalic and atraumatic.  Eyes: Conjunctivae are normal. No scleral icterus.  Genitourinary:  Genitourinary Comments: She has no lesions over her labia or vaginal canal. There is some thin white discharge in her vaginal canal. Her cervix is nonerythematous and without discharge  Skin: She is not diaphoretic.  Psychiatric: Her behavior is normal. Her mood appears anxious.   Assessment & Plan:   See Encounters Tab for problem based charting.  Healthcare maintenance  -Performed Pap smear today.   Addendum: Pap smear results showed adequate inclusion of  the transition zone with no malignant cells present. I have left a message that she can for these results.   Generalized anxiety She has a score of 15 on GAD 7 survey. I believe she has severe generalized anxiety and would benefit from beginning treatment.  -prescribed sertraline 25 mg daily -Return to clinic in one month for follow-up  Numbness and tingling in left arm She reports that she has continued to have tingling and numbness in her arm since the time of last visit in November. Evaluation at that time she was assessed to have subacromial bursitis and was prescribed naproxen. She says that the naproxen was not affordable. I provided her with a printed prescription for naproxen today and advised her this is on the $4 list at [facility]  Patient discussed with Dr. Lynnae January

## 2016-10-06 ENCOUNTER — Ambulatory Visit: Payer: Self-pay

## 2016-10-06 NOTE — Progress Notes (Signed)
Internal Medicine Clinic Attending  Case discussed with Dr. Hetty Ely at the time of the visit.  We reviewed the resident's history and exam and pertinent patient test results.  I agree with the assessment, diagnosis, and plan of care documented in the resident's note.

## 2016-10-07 NOTE — Addendum Note (Signed)
Addended by: Meryl Dare on: 10/07/2016 08:04 PM   Modules accepted: Level of Service

## 2016-10-12 ENCOUNTER — Emergency Department (HOSPITAL_COMMUNITY): Payer: Self-pay

## 2016-10-12 ENCOUNTER — Encounter (HOSPITAL_COMMUNITY): Payer: Self-pay | Admitting: Emergency Medicine

## 2016-10-12 ENCOUNTER — Emergency Department (HOSPITAL_COMMUNITY)
Admission: EM | Admit: 2016-10-12 | Discharge: 2016-10-13 | Disposition: A | Discharge status: Home / Self Care | Payer: Self-pay | Attending: Emergency Medicine | Admitting: Emergency Medicine

## 2016-10-12 DIAGNOSIS — Z91040 Latex allergy status: Secondary | ICD-10-CM | POA: Insufficient documentation

## 2016-10-12 DIAGNOSIS — Z79899 Other long term (current) drug therapy: Secondary | ICD-10-CM | POA: Insufficient documentation

## 2016-10-12 DIAGNOSIS — F17210 Nicotine dependence, cigarettes, uncomplicated: Secondary | ICD-10-CM | POA: Insufficient documentation

## 2016-10-12 DIAGNOSIS — R079 Chest pain, unspecified: Secondary | ICD-10-CM

## 2016-10-12 DIAGNOSIS — R072 Precordial pain: Secondary | ICD-10-CM | POA: Insufficient documentation

## 2016-10-12 LAB — CBC WITH DIFFERENTIAL/PLATELET
BASOS ABS: 0 10*3/uL (ref 0.0–0.1)
BASOS PCT: 0 %
Eosinophils Absolute: 0.3 10*3/uL (ref 0.0–0.7)
Eosinophils Relative: 4 %
HEMATOCRIT: 39.2 % (ref 36.0–46.0)
HEMOGLOBIN: 12.8 g/dL (ref 12.0–15.0)
Lymphocytes Relative: 27 %
Lymphs Abs: 2.3 10*3/uL (ref 0.7–4.0)
MCH: 27.4 pg (ref 26.0–34.0)
MCHC: 32.7 g/dL (ref 30.0–36.0)
MCV: 83.9 fL (ref 78.0–100.0)
MONO ABS: 0.5 10*3/uL (ref 0.1–1.0)
Monocytes Relative: 6 %
NEUTROS ABS: 5.3 10*3/uL (ref 1.7–7.7)
NEUTROS PCT: 63 %
Platelets: 228 10*3/uL (ref 150–400)
RBC: 4.67 MIL/uL (ref 3.87–5.11)
RDW: 14.2 % (ref 11.5–15.5)
WBC: 8.4 10*3/uL (ref 4.0–10.5)

## 2016-10-12 LAB — I-STAT TROPONIN, ED
TROPONIN I, POC: 0 ng/mL (ref 0.00–0.08)
Troponin i, poc: 0 ng/mL (ref 0.00–0.08)

## 2016-10-12 LAB — I-STAT CHEM 8, ED
BUN: 12 mg/dL (ref 6–20)
CHLORIDE: 102 mmol/L (ref 101–111)
CREATININE: 0.9 mg/dL (ref 0.44–1.00)
Calcium, Ion: 1.3 mmol/L (ref 1.15–1.40)
Glucose, Bld: 93 mg/dL (ref 65–99)
HEMATOCRIT: 37 % (ref 36.0–46.0)
Hemoglobin: 12.6 g/dL (ref 12.0–15.0)
POTASSIUM: 4.1 mmol/L (ref 3.5–5.1)
Sodium: 141 mmol/L (ref 135–145)
TCO2: 30 mmol/L (ref 0–100)

## 2016-10-12 MED ORDER — ASPIRIN 81 MG PO CHEW
324.0000 mg | CHEWABLE_TABLET | Freq: Once | ORAL | Status: AC
  Administered 2016-10-12: 324 mg via ORAL
  Filled 2016-10-12: qty 4

## 2016-10-12 MED ORDER — LORAZEPAM 1 MG PO TABS
0.5000 mg | ORAL_TABLET | Freq: Once | ORAL | Status: AC
  Administered 2016-10-12: 0.5 mg via ORAL
  Filled 2016-10-12: qty 1

## 2016-10-12 NOTE — ED Triage Notes (Signed)
Per [facility]   Pt has stressors going on in her life. Recent death anniversary passed and mo. Pt is on zoloft has not started just as of yet.Sounds like panic attack to EMS. No diaphoresis. Calmed down easily. Dizziness and weak legs with EMS. Stroke scale negative. Inc BP. Pt ambulatory to the bathroom with no assistance.

## 2016-10-12 NOTE — ED Provider Notes (Signed)
[facility] DEPT Provider Note   CSN: [id number] Arrival date & time: 10/12/16  1947     History   Chief Complaint Chief Complaint  Patient presents with  . Shortness of Breath    HPI Tiffany Coleman is a 61 y.o. female.   Chest Pain   This is a recurrent problem. The problem has been resolved. The pain is associated with an emotional upset. The pain is present in the substernal region. The pain is moderate. The quality of the pain is described as brief and burning. The pain does not radiate. Associated symptoms include back pain (chronic), dizziness (several weeks ago), nausea (chronic), near-syncope (several weeks ago), palpitations and shortness of breath. Pertinent negatives include no abdominal pain, no claudication, no cough, no diaphoresis, no exertional chest pressure, no fever, no headaches, no hemoptysis, no irregular heartbeat, no leg pain, no lower extremity edema, no malaise/fatigue, no numbness, no orthopnea, no sputum production, no syncope, no vomiting and no weakness. Risk factors include lack of exercise, obesity, stress, smoking/tobacco exposure and sedentary lifestyle.  Her past medical history is significant for anxiety/panic attacks and hypertension.  Pertinent negatives for past medical history include no congenital heart disease and no PE.  Pertinent negatives for family medical history include: no CAD (son died from cardiomyopathy but not CAD).    Pt states these events usually happen after stress, prior event was during son's death anniversary Has been more stressed lately Today, was feeling like her mouth was twisted, neck got tight, she felt sob and started shaking, heart was going fast, some chest discomofort Lasted about 20 min Left and then returned about 10 min Not taking SSRI yet but recently prescribed Pt states she became upset with another co-worker when pain started    Past Medical History:  Diagnosis Date  . Hx of colonic polyps 2015  .  Obstructive sleep apnea 06/02/2016   No CPAP yet  . Recurrent ventral hernia 09/08/2013    Patient Active Problem List   Diagnosis Date Noted  . Generalized anxiety disorder 10/05/2016  . Encounter for cervical Pap smear with pelvic exam 10/04/2016  . Numbness and tingling in left arm 06/21/2016  . History of colonic polyps 06/03/2016  . Constipation 06/03/2016  . Obstructive sleep apnea 06/02/2016  . Palpitations 06/02/2016  . History of illicit drug use 05/30/5101  . Recurrent ventral hernia 09/08/2013    Past Surgical History:  Procedure Laterality Date  . ABDOMINAL HYSTERECTOMY    . BREAST SURGERY    . COLON SURGERY    . HERNIA REPAIR    . KNEE SURGERY Right 2017  . PARTIAL COLECTOMY Left ~2004   for obstruction  . stomach tumur     [id number]    OB History    No data available       Home Medications    Prior to Admission medications   Medication Sig Start Date End Date Taking? Authorizing Provider  zolpidem (AMBIEN) 10 MG tablet Take 5-10 mg by mouth at bedtime as needed for sleep.  10/09/16  Yes Historical Provider, MD  naproxen (EC NAPROSYN) 500 MG EC tablet Take 1 tablet (500 mg total) by mouth 3 (three) times daily with meals. Patient not taking: Reported on 10/12/2016 10/04/16   Ledell Noss, MD  omeprazole (PRILOSEC) 20 MG capsule Take 1 capsule (20 mg total) by mouth daily. Patient not taking: Reported on 10/12/2016 06/19/16   Valinda Party, DO  sertraline (ZOLOFT) 25 MG tablet Take 1 tablet (25 mg  total) by mouth daily. Patient not taking: Reported on 10/12/2016 10/04/16 10/04/17  Ledell Noss, MD    Family History Family History  Problem Relation Age of Onset  . Deep vein thrombosis Mother   . Diabetes Mother   . Hypertension Mother   . Varicose Veins Mother   . Cancer Father   . Hypertension Sister   . Hyperlipidemia Brother   . Hypertension Daughter   . Heart attack Son   . Cancer Maternal Uncle     Social History Social History  Substance  Use Topics  . Smoking status: Current Some Day Smoker    Years: 20.00    Types: Cigarettes  . Smokeless tobacco: Never Used     Comment: smokes 2-3 cigs per day   . Alcohol use 1.2 oz/week    2 Glasses of wine per week     Allergies   Latex; Penicillins; Tape; and Codeine   Review of Systems Review of Systems  Constitutional: Negative for diaphoresis, fever and malaise/fatigue.  Respiratory: Positive for shortness of breath. Negative for cough, hemoptysis and sputum production.   Cardiovascular: Positive for chest pain, palpitations and near-syncope (several weeks ago). Negative for orthopnea, claudication and syncope.  Gastrointestinal: Positive for nausea (chronic). Negative for abdominal pain and vomiting.  Musculoskeletal: Positive for back pain (chronic).  Neurological: Positive for dizziness (several weeks ago). Negative for weakness, numbness and headaches.  All other systems reviewed and are negative.    Physical Exam Updated Vital Signs BP 143/96 (BP Location: Right Arm)   Pulse 74   Temp 98.9 F (37.2 C) (Oral)   Resp 16   SpO2 99%   Physical Exam  Constitutional: She appears well-developed and well-nourished. No distress.  HENT:  Head: Normocephalic and atraumatic.  Eyes: Conjunctivae are normal. Right eye exhibits no discharge. Left eye exhibits no discharge.  Neck: Normal range of motion. Neck supple. No JVD present.  Cardiovascular: Normal rate, regular rhythm, normal heart sounds and intact distal pulses.   No murmur heard. Pulmonary/Chest: Effort normal and breath sounds normal. No respiratory distress. She has no wheezes. She has no rales. She exhibits no tenderness.  Abdominal: Soft. Bowel sounds are normal. She exhibits no distension and no mass. There is no tenderness. There is no rebound and no guarding.  Musculoskeletal: She exhibits no edema.  Neurological: She is alert.  Skin: Skin is warm. No rash noted.  Psychiatric:  Appears anxious    Nursing note and vitals reviewed.    ED Treatments / Results  Labs (all labs ordered are listed, but only abnormal results are displayed) Labs Reviewed  CBC WITH DIFFERENTIAL/PLATELET  I-STAT CHEM 8, ED  I-STAT TROPOININ, ED  Randolm Idol, ED    EKG  EKG Interpretation None       Radiology Dg Chest 2 View  Result Date: 10/12/2016 CLINICAL DATA:  Central chest pain radiating right posteriorly. EXAM: CHEST  2 VIEW COMPARISON:  07/03/2015 FINDINGS: The cardiomediastinal contours are normal. The lungs are clear. Pulmonary vasculature is normal. No consolidation, pleural effusion, or pneumothorax. No acute osseous abnormalities are seen. Mild degenerative changes in the spine are stable. IMPRESSION: No acute abnormality or change from prior exam. Electronically Signed   By: Jeb Levering M.D.   On: 10/12/2016 20:56    Procedures Procedures (including critical care time)  Medications Ordered in ED Medications  LORazepam (ATIVAN) tablet 0.5 mg (0.5 mg Oral Given 10/12/16 2228)  aspirin chewable tablet 324 mg (324 mg Oral Given 10/12/16 2228)  Initial Impression / Assessment and Plan / ED Course  I have reviewed the triage vital signs and the nursing notes.  Pertinent labs & imaging results that were available during my care of the patient were reviewed by me and considered in my medical decision making (see chart for details).     Chest pain likely secondary to anxiety given hx, exam, ekg, labs Doubt ACS - delta trop negative, EKG reassuring. However, given age, risk factors, pt has a HEAR of 4 and will likely benefit from outpt echo. Pt already has an appt with PCP this month but feels comfortable calling them tomorrow am to follow up for possible stress test.  Doubt PE, dissection - not HTN, no radiation to back, equal pulses, atypical for PE CXR/labs reassuring.  Encouraged to start SSRI and fu tomorrow with pcp, strict return precautions Advised to stop tobacco  use, weight loss DC in good condition  Final Clinical Impressions(s) / ED Diagnoses   Final diagnoses:  Chest pain in adult    New Prescriptions New Prescriptions   No medications on file     Karma Greaser, MD 10/13/16 0004    Pattricia Boss, MD 10/13/16 [id number]

## 2016-10-13 MED FILL — SERTRALINE HCL 25 MG TABLET: 25 | 30 days supply | Qty: 30 | Fill #0

## 2016-11-01 ENCOUNTER — Ambulatory Visit: Payer: Self-pay

## 2016-11-02 ENCOUNTER — Telehealth: Payer: Self-pay | Admitting: Internal Medicine

## 2016-11-02 NOTE — Telephone Encounter (Signed)
APT. REMINDER CALL, LMTCB °

## 2016-11-03 ENCOUNTER — Ambulatory Visit: Payer: Self-pay

## 2016-11-03 ENCOUNTER — Encounter: Payer: Self-pay | Admitting: Internal Medicine

## 2016-12-18 ENCOUNTER — Telehealth: Payer: Self-pay | Admitting: Internal Medicine

## 2016-12-18 NOTE — Telephone Encounter (Signed)
CALLED PATIENT, NO ANSWER, NO VOICEMAIL, TIME TO RENEW [facility] CARD

## 2017-01-05 ENCOUNTER — Ambulatory Visit: Payer: Self-pay

## 2017-01-19 ENCOUNTER — Other Ambulatory Visit: Payer: Self-pay | Admitting: Internal Medicine

## 2017-01-19 ENCOUNTER — Ambulatory Visit: Payer: Self-pay

## 2017-01-19 ENCOUNTER — Encounter: Payer: Self-pay | Admitting: Internal Medicine

## 2017-01-19 ENCOUNTER — Ambulatory Visit (INDEPENDENT_AMBULATORY_CARE_PROVIDER_SITE_OTHER): Payer: Self-pay | Admitting: Internal Medicine

## 2017-01-19 VITALS — BP 163/86 | HR 82 | Temp 98.4°F | Ht 63.0 in | Wt 209.7 lb

## 2017-01-19 DIAGNOSIS — Z6837 Body mass index (BMI) 37.0-37.9, adult: Secondary | ICD-10-CM

## 2017-01-19 DIAGNOSIS — J302 Other seasonal allergic rhinitis: Secondary | ICD-10-CM

## 2017-01-19 DIAGNOSIS — E669 Obesity, unspecified: Secondary | ICD-10-CM

## 2017-01-19 LAB — POCT GLYCOSYLATED HEMOGLOBIN (HGB A1C): Hemoglobin A1C: 5.5

## 2017-01-19 LAB — GLUCOSE, CAPILLARY: Glucose-Capillary: 76 mg/dL (ref 65–99)

## 2017-01-19 MED ORDER — FLUTICASONE PROPIONATE 50 MCG/ACT NA SUSP: 1.0000 | Freq: Every day | NASAL | 0 refills | Status: DC

## 2017-01-19 MED ORDER — CETIRIZINE HCL 10 MG PO CAPS: 10.0000 mg | ORAL_CAPSULE | Freq: Every day | ORAL | 2 refills | Status: DC | PRN

## 2017-01-19 MED ORDER — ALBUTEROL SULFATE HFA 108 (90 BASE) MCG/ACT IN AERS: 2.0000 | INHALATION_SPRAY | Freq: Four times a day (QID) | RESPIRATORY_TRACT | 2 refills | Status: DC | PRN

## 2017-01-19 NOTE — Patient Instructions (Addendum)
For your allergies, I've prescribed Flonase nasal spray and Zyrtec to take once a day as needed for allergies. Please do not take Zyrtec and Benadryl on the same day, he can take Benadryl instead of Zyrtec if you prefer.  I've prescribed you an albuterol inhaler to use only as needed if you feel like you're wheezing or getting shortness of breath. I have also ordered some breathing tests to see if we can better assess your asthma in order to put her prescribed inhalers in the future.    Allergies An allergy is when your body reacts to a substance in a way that is not normal. An allergic reaction can happen after you:  Eat something.  Breathe in something.  Touch something.  You can be allergic to:  Things that are only around during certain seasons, like molds and pollens.  Foods.  Drugs.  Insects.  Animal dander.  What are the signs or symptoms?  Puffiness (swelling). This may happen on the lips, face, tongue, mouth, or throat.  Sneezing.  Coughing.  Breathing loudly (wheezing).  Stuffy nose.  Tingling in the mouth.  A rash.  Itching.  Itchy, red, puffy areas of skin (hives).  Watery eyes.  Throwing up (vomiting).  Watery poop (diarrhea).  Dizziness.  Feeling faint or fainting.  Trouble breathing or swallowing.  A tight feeling in the chest.  A fast heartbeat. How is this diagnosed? Allergies can be diagnosed with:  A medical and family history.  Skin tests.  Blood tests.  A food diary. A food diary is a record of all the foods, drinks, and symptoms you have each day.  The results of an elimination diet. This diet involves making sure not to eat certain foods and then seeing what happens when you start eating them again.  How is this treated? There is no cure for allergies, but allergic reactions can be treated with medicine. Severe reactions usually need to be treated at a hospital. How is this prevented? The best way to prevent an  allergic reaction is to avoid the thing you are allergic to. Allergy shots and medicines can also help prevent reactions in some cases. This information is not intended to replace advice given to you by your health care provider. Make sure you discuss any questions you have with your health care provider. Document Released: 11/25/2012 Document Revised: 03/27/2016 Document Reviewed: 05/12/2014 Elsevier Interactive Patient Education  [employer].

## 2017-01-19 NOTE — Progress Notes (Signed)
   CC: "I've got real bad allergies."  HPI:  Tiffany Coleman is a 61 y.o. woman with history of obesity, OSA and anxiety who presents with complaints of seasonal allergies.  Since moving to [place] from [place] about 8 years ago, she says that every summer she has sneezing, nasal congestion, runny nose, itchy red eyes, and sinus headaches.  She has tried Claritin in the past but didn't like the way it made her mouth dry. She says Benadryl helps but makes her sleepy.  She will occasionally get shortness of breath and wheezing as well. She has had bronchitis and used inhalers since she was a child. She used to see a doctor here who would prescribe her inhalers for her asthma as an adult.  Past Medical History:  Diagnosis Date  . Hx of colonic polyps 2015  . Obstructive sleep apnea 06/02/2016   No CPAP yet  . Recurrent ventral hernia 09/08/2013    Review of Systems:   Review of Systems  Constitutional: Negative for chills and fever.  HENT: Positive for congestion and sinus pain.   Eyes: Positive for discharge and redness.  Respiratory: Positive for shortness of breath and wheezing.   Endo/Heme/Allergies: Positive for environmental allergies.   Physical Exam:  Vitals:   01/19/17 1552  BP: (!) 163/86  Pulse: 82  Temp: 98.4 F (36.9 C)  TempSrc: Oral  SpO2: 100%  Weight: 209 lb 11.2 oz (95.1 kg)  Height: _0  (1.6 m)   Body mass index is 37.15 kg/m.  Physical Exam  Constitutional: She is oriented to person, place, and time.  Obese woman in no distress  HENT:  Nasal septum with large perforation Nasal turbinates with mild erythema and edema  Eyes: Conjunctivae are normal. Right eye exhibits no discharge. Left eye exhibits no discharge. No scleral icterus.  Cardiovascular: Normal rate and regular rhythm.   Pulmonary/Chest: Effort normal. No respiratory distress. She has no wheezes.  Neurological: She is alert and oriented to person, place, and time.    Psychiatric: She has a normal mood and affect. Her behavior is normal.    Assessment & Plan:   See Encounters Tab for problem based charting.  Patient discussed with Dr. Dareen Piano

## 2017-01-19 NOTE — Assessment & Plan Note (Signed)
Seasonal allergies with allergic rhinitis.  Reports history of asthma and inhaler use, no wheezing on exam today, this may be the case patient with atopy.  -Cetirizine and nasal fluticasone as needed -Albuterol inhaler when necessary -PFTs

## 2017-01-19 NOTE — Assessment & Plan Note (Signed)
Patient requests diabetes screening is indicated in an obese patient.  Lab Results  Component Value Date   HGBA1C 5.5 01/19/2017   A1c normal, no diabetes.

## 2017-01-22 NOTE — Progress Notes (Signed)
Internal Medicine Clinic Attending  Case discussed with Dr. Inda Castle at the time of the visit.  We reviewed the resident's history and exam and pertinent patient test results.  I agree with the assessment, diagnosis, and plan of care documented in the resident's note.

## 2017-02-23 ENCOUNTER — Ambulatory Visit: Payer: Self-pay

## 2017-02-28 ENCOUNTER — Ambulatory Visit: Payer: Self-pay

## 2017-03-06 ENCOUNTER — Ambulatory Visit (INDEPENDENT_AMBULATORY_CARE_PROVIDER_SITE_OTHER): Payer: Self-pay | Admitting: Internal Medicine

## 2017-03-06 ENCOUNTER — Encounter: Payer: Self-pay | Admitting: Internal Medicine

## 2017-03-06 VITALS — BP 150/81 | HR 66 | Temp 97.9°F | Ht 63.0 in | Wt 211.0 lb

## 2017-03-06 DIAGNOSIS — R1013 Epigastric pain: Secondary | ICD-10-CM

## 2017-03-06 DIAGNOSIS — I10 Essential (primary) hypertension: Secondary | ICD-10-CM

## 2017-03-06 DIAGNOSIS — R300 Dysuria: Secondary | ICD-10-CM | POA: Insufficient documentation

## 2017-03-06 DIAGNOSIS — Z860100 Personal history of colon polyps, unspecified: Secondary | ICD-10-CM

## 2017-03-06 DIAGNOSIS — Z79899 Other long term (current) drug therapy: Secondary | ICD-10-CM

## 2017-03-06 MED ORDER — DIPHENHYDRAMINE HCL 25 MG PO TABS: 25.0000 mg | ORAL_TABLET | Freq: Four times a day (QID) | ORAL | 0 refills | Status: DC | PRN

## 2017-03-06 NOTE — Patient Instructions (Addendum)
Ms. Simerson,   I will call you with the results of your lab results tomorrow. I have ordered an ultrasound of your abdomen, someone from the clinic should call you to have this done tomorrow.    Please schedule a follow up appointment in 2 weeks, if your pain is better you can cancel this appointment and follow up with me in October.

## 2017-03-06 NOTE — Progress Notes (Signed)
   CC: abdominal pain   HPI:  Tiffany Coleman is a 61 y.o. with PMH as listed below who presents for acute concern of abdominal pain and hypertension and follow up of history of colonic polyps. Please see the assessment and plans for the status of the patient chronic medical problems.   Past Medical History:  Diagnosis Date  . Hx of colonic polyps 2015  . Obstructive sleep apnea 06/02/2016   No CPAP yet  . Recurrent ventral hernia 09/08/2013   Review of Systems: Refer to history of present illness and assessment and plans for pertinent review of systems, all others reviewed and negative  Physical Exam:  Vitals:   03/06/17 1553  BP: (!) 150/81  Pulse: 66  Temp: 97.9 F (36.6 C)  TempSrc: Oral  SpO2: 100%  Weight: 211 lb (95.7 kg)  Height: _0  (1.6 m)   Physical Exam  Constitutional: She is well-developed, well-nourished, and in no distress. No distress.  HENT:  Head: Normocephalic and atraumatic.  Cardiovascular: Normal rate and regular rhythm.   No murmur heard. No peripheral edema   Pulmonary/Chest: Effort normal. She has no wheezes. She has no rales.  Abdominal: Soft. She exhibits no distension. There is no guarding.  Delayed bowel sounds. Tenderness to palpation over the epigastrum. Murphys negative   Skin: She is not diaphoretic.     Assessment & Plan:   Abdominal pain  Patient describes progressively worsening RUQ abdominal pain for the past month. She associates the pain with urticaria, nausea, malodorous urine and itching with urination. The pain is in her right upper quadrant and radiates around her side to her back. The pain is worse with eating and becomes associated with nausea. She denies fevers, changes in appetite, dysuria, or urgency. Stools have been darker than usual but are not black or tarry and no BRBPR.  On exam murphys sign is negative but she has epigastric tenderness to palpation. She has a history of 3 ventral abdominal wall hernia repairs  and says that she has a mesh placed in that area years ago.  Differentials include cholecystitis, acute hepatitis, pancreatitis, GERD and peptic ulcer disease. Patient ask to see general surgery again for evaluation of the mesh if this initial workup is negative.  - Ordered CMP and lipase >> Alk phos, liver transaminase, lipase and urinalysis all unremarkable - Ordered RUQ ultrasound  - RTC in 2 weeks   History of colonic polyps  Has multiple colonic polyps on colonoscopy in 2015. She was supposed to have follow up colonoscopy in 2016 but did not have insurance at that time. She denies constipation, BRBPR or melena.  - Referral for colonoscopy   Hypertension  BP Readings from Last 3 Encounters:  03/06/17 (!) 150/81  01/19/17 (!) 163/86  10/13/16 130/71  BP uncontrolled today and at past visit. She has not been on treatment in the past for HTN. Boal BP will be <130/80.   - Prescribe lisinopril 5 mg daily because this is $4 medication  See Encounters Tab for problem based charting.  Patient discussed with Dr. Daryll Drown

## 2017-03-07 LAB — CMP14 + ANION GAP
ALBUMIN: 4.3 g/dL (ref 3.6–4.8)
ALK PHOS: 71 IU/L (ref 39–117)
ALT: 15 IU/L (ref 0–32)
ANION GAP: 14 mmol/L (ref 10.0–18.0)
AST: 17 IU/L (ref 0–40)
Albumin/Globulin Ratio: 1.5 (ref 1.2–2.2)
BILIRUBIN TOTAL: 0.4 mg/dL (ref 0.0–1.2)
BUN / CREAT RATIO: 13 (ref 12–28)
BUN: 10 mg/dL (ref 8–27)
CHLORIDE: 101 mmol/L (ref 96–106)
CO2: 25 mmol/L (ref 20–29)
CREATININE: 0.76 mg/dL (ref 0.57–1.00)
Calcium: 10 mg/dL (ref 8.7–10.3)
GFR calc non Af Amer: 84 mL/min/{1.73_m2} (ref >59)
GFR, EST AFRICAN AMERICAN: 97 mL/min/{1.73_m2} (ref >59)
Globulin, Total: 2.8 g/dL (ref 1.5–4.5)
Glucose: 82 mg/dL (ref 65–99)
Potassium: 4.3 mmol/L (ref 3.5–5.2)
Sodium: 140 mmol/L (ref 134–144)
TOTAL PROTEIN: 7.1 g/dL (ref 6.0–8.5)

## 2017-03-07 LAB — URINALYSIS, ROUTINE W REFLEX MICROSCOPIC
BILIRUBIN UA: NEGATIVE
GLUCOSE, UA: NEGATIVE
Ketones, UA: NEGATIVE
Leukocytes, UA: NEGATIVE
NITRITE UA: NEGATIVE
PROTEIN UA: NEGATIVE
RBC UA: NEGATIVE
Specific Gravity, UA: 1.005 (ref 1.005–1.030)
UUROB: 0.2 mg/dL (ref 0.2–1.0)
pH, UA: 7 (ref 5.0–7.5)

## 2017-03-07 LAB — LIPASE: LIPASE: 25 U/L (ref 14–72)

## 2017-03-08 ENCOUNTER — Other Ambulatory Visit: Payer: Self-pay

## 2017-03-08 DIAGNOSIS — I10 Essential (primary) hypertension: Secondary | ICD-10-CM | POA: Insufficient documentation

## 2017-03-08 NOTE — Assessment & Plan Note (Signed)
Has multiple colonic polyps on colonoscopy in 2015. She was supposed to have follow up colonoscopy in 2016 but did not have insurance at that time. She denies constipation, BRBPR or melena.  - Referral for colonoscopy

## 2017-03-08 NOTE — Assessment & Plan Note (Signed)
BP Readings from Last 3 Encounters:  03/06/17 (!) 150/81  01/19/17 (!) 163/86  10/13/16 130/71  BP uncontrolled today and at past visit. She has not been on treatment in the past for HTN. Boal BP will be <130/80.   - Prescribe lisinopril 5 mg daily because this is $4 medication

## 2017-03-08 NOTE — Assessment & Plan Note (Addendum)
Patient describes progressively worsening RUQ abdominal pain for the past month. She associates the pain with urticaria, nausea, malodorous urine and itching with urination. The pain is in her right upper quadrant and radiates around her side to her back. The pain is worse with eating and becomes associated with nausea. She denies fevers, changes in appetite, dysuria, or urgency. Stools have been darker than usual but are not black or tarry and no BRBPR.  On exam murphys sign is negative but she has epigastric tenderness to palpation. She has a history of 3 ventral abdominal wall hernia repairs and says that she has a mesh placed in that area years ago.  Differentials include cholecystitis, acute hepatitis, pancreatitis, GERD and peptic ulcer disease. Patient ask to see general surgery again for evaluation of the mesh if this initial workup is negative.   - Ordered CMP and lipase >> Alk phos, liver transaminase, lipase and urinalysis all unremarkable - Ordered RUQ ultrasound  - provided stool cards today  - RTC in 2 weeks   Addendum: Alk phos, liver transaminase, lipase and urinalysis all unremarkable. Will follow up the results of the RUQ ultrasound.

## 2017-03-09 MED ORDER — LISINOPRIL 5 MG PO TABS: 5.0000 mg | ORAL_TABLET | Freq: Every day | ORAL | 3 refills | Status: DC

## 2017-03-09 NOTE — Addendum Note (Signed)
Addended by: Meryl Dare on: 03/09/2017 11:49 AM   Modules accepted: Orders

## 2017-03-10 NOTE — Progress Notes (Signed)
Internal Medicine Clinic Attending  I saw and evaluated the patient.  I personally confirmed the key portions of the history and exam documented by Dr. Hetty Ely and I reviewed pertinent patient test results.  The assessment, diagnosis, and plan were formulated together and I agree with the documentation in the resident's note.

## 2017-03-21 ENCOUNTER — Ambulatory Visit (HOSPITAL_COMMUNITY): Payer: Self-pay

## 2017-04-11 ENCOUNTER — Other Ambulatory Visit: Payer: Self-pay | Admitting: Gastroenterology

## 2017-04-18 ENCOUNTER — Ambulatory Visit (HOSPITAL_COMMUNITY)
Admission: RE | Admit: 2017-04-18 | Discharge: 2017-04-18 | Disposition: A | Discharge status: Home / Self Care | Payer: Self-pay | Source: Ambulatory Visit | Attending: Internal Medicine | Admitting: Internal Medicine

## 2017-04-18 DIAGNOSIS — R1013 Epigastric pain: Secondary | ICD-10-CM | POA: Insufficient documentation

## 2017-05-23 ENCOUNTER — Other Ambulatory Visit: Payer: Self-pay | Admitting: Gastroenterology

## 2017-05-24 ENCOUNTER — Encounter (HOSPITAL_COMMUNITY): Payer: Self-pay | Admitting: *Deleted

## 2017-05-28 NOTE — Progress Notes (Addendum)
   CC: follow up of obstructive sleep apnea   HPI:  TiffanyTiffany Coleman is a 61 y.o. with PMH HTN, OSA and anxiety who presents for follow up of blood pressure and OSA. Please see the assessment and plans for the status of the patient chronic medical problems.   Past Medical History:  Diagnosis Date  . Anemia yrs ago  . Arthritis    knees  . Bronchitis    hx  . Complication of anesthesia    trouble breathing when wake up needs breathing tx when wakes up  . Hx of colonic polyps 2015  . Obstructive sleep apnea 06/02/2016   No CPAP used  . Recurrent ventral hernia 09/08/2013  . Umbilical hernia    Review of Systems:  Refer to history of present illness and assessment and plans for pertinent review of systems, all others reviewed and negative  Physical Exam:  Vitals:   05/29/17 1508  BP: 124/74  Pulse: 73  Temp: 98.3 F (36.8 C)  TempSrc: Oral  SpO2: 100%  Weight: 214 lb 6.4 oz (97.3 kg)  Height: _0  (1.6 m)   General: well appearing, no acute distress, seems anxious  Cardiac: RRR, no murmur  Lungs: clear to auscultation  Abdomen: soft, distended, hernia over the area of the epigastrum, non tender   Extremities: no peripheral edema   Assessment & Plan:   ventral hernia ( s/p repair with mesh) Does have discomfort related to the mesh, this comes and goes. Has constipation at times and colonoscopy today revealed diverticulosisand internal hemorrhoid. -encouraged miralax      obstructive sleep apnea Diagnosed on sleep study about 2 years ago, she did not have insurance at that time so the process of CPAP fitting was not completed. Reports insomnia, daytime fatigue and headaches. Will need a repeat sleep study as the prior is likely too old to cover CPAP. Requesting Tiffany Coleman, has used this in the past but has not yet tried melatonin. - referral for sleep study - trial of melatonin - can consider ambien as needed after sleep apnea is being treated properly and she has  better compliance with proper sleep hygiene but trying to avoid this medication  Addendum: Tiffany Coleman did not schedule the sleep study   GERD Controlled with omeprazole, she has heartburn when she does not take this medication. refilled omeprazole  Multiple pulled teeth  Has multiple missing teeth, partials are broken. No visible dental carries or tooth infections but she is in need of new hardware. - placed referral for dentistry, she request to go to [place]  HTN BP Readings from Last 3 Encounters:  05/29/17 124/74  05/29/17 (!) 154/92  03/06/17 (!) 150/81  Blood pressure well controlled today  -continue Lisinopril 5 mg qd   Generalized anxiety disorder  Self discontinued sertraline. Appears anxious today but she denies feelings of anxiety and GAD -7 is 2.  - continue to monitor   Healthcare maintenance went for colonoscopy today, a polyp was removed, awaiting pathology provided referral information for mammogram through a scholarship program declined flu shot today  See Encounters Tab for problem based charting.  Patient discussed with Dr. Lynnae January

## 2017-05-29 ENCOUNTER — Ambulatory Visit (HOSPITAL_COMMUNITY): Payer: Self-pay | Admitting: Anesthesiology

## 2017-05-29 ENCOUNTER — Ambulatory Visit (INDEPENDENT_AMBULATORY_CARE_PROVIDER_SITE_OTHER): Payer: Self-pay | Admitting: Internal Medicine

## 2017-05-29 ENCOUNTER — Encounter (HOSPITAL_COMMUNITY): Payer: Self-pay | Admitting: *Deleted

## 2017-05-29 ENCOUNTER — Encounter (HOSPITAL_COMMUNITY)
Admission: RE | Disposition: A | Discharge status: Home / Self Care | Payer: Self-pay | Source: Ambulatory Visit | Attending: Gastroenterology

## 2017-05-29 ENCOUNTER — Ambulatory Visit (HOSPITAL_COMMUNITY)
Admission: RE | Admit: 2017-05-29 | Discharge: 2017-05-29 | Disposition: A | Discharge status: Home / Self Care | Payer: Self-pay | Source: Ambulatory Visit | Attending: Gastroenterology | Admitting: Gastroenterology

## 2017-05-29 DIAGNOSIS — Z860100 Personal history of colon polyps, unspecified: Secondary | ICD-10-CM

## 2017-05-29 DIAGNOSIS — M170 Bilateral primary osteoarthritis of knee: Secondary | ICD-10-CM | POA: Insufficient documentation

## 2017-05-29 DIAGNOSIS — Z880 Allergy status to penicillin: Secondary | ICD-10-CM | POA: Insufficient documentation

## 2017-05-29 DIAGNOSIS — F17200 Nicotine dependence, unspecified, uncomplicated: Secondary | ICD-10-CM | POA: Insufficient documentation

## 2017-05-29 DIAGNOSIS — D124 Benign neoplasm of descending colon: Secondary | ICD-10-CM | POA: Insufficient documentation

## 2017-05-29 DIAGNOSIS — Q438 Other specified congenital malformations of intestine: Secondary | ICD-10-CM | POA: Insufficient documentation

## 2017-05-29 DIAGNOSIS — K08109 Complete loss of teeth, unspecified cause, unspecified class: Secondary | ICD-10-CM

## 2017-05-29 DIAGNOSIS — G4733 Obstructive sleep apnea (adult) (pediatric): Secondary | ICD-10-CM

## 2017-05-29 DIAGNOSIS — K640 First degree hemorrhoids: Secondary | ICD-10-CM | POA: Insufficient documentation

## 2017-05-29 DIAGNOSIS — Z79899 Other long term (current) drug therapy: Secondary | ICD-10-CM | POA: Insufficient documentation

## 2017-05-29 DIAGNOSIS — K219 Gastro-esophageal reflux disease without esophagitis: Secondary | ICD-10-CM | POA: Insufficient documentation

## 2017-05-29 DIAGNOSIS — I10 Essential (primary) hypertension: Secondary | ICD-10-CM | POA: Insufficient documentation

## 2017-05-29 DIAGNOSIS — K432 Incisional hernia without obstruction or gangrene: Secondary | ICD-10-CM

## 2017-05-29 DIAGNOSIS — R1084 Generalized abdominal pain: Secondary | ICD-10-CM | POA: Diagnosis present

## 2017-05-29 DIAGNOSIS — F411 Generalized anxiety disorder: Secondary | ICD-10-CM

## 2017-05-29 DIAGNOSIS — Z885 Allergy status to narcotic agent status: Secondary | ICD-10-CM | POA: Insufficient documentation

## 2017-05-29 DIAGNOSIS — Z888 Allergy status to other drugs, medicaments and biological substances status: Secondary | ICD-10-CM | POA: Insufficient documentation

## 2017-05-29 DIAGNOSIS — Z91040 Latex allergy status: Secondary | ICD-10-CM | POA: Insufficient documentation

## 2017-05-29 DIAGNOSIS — Z1211 Encounter for screening for malignant neoplasm of colon: Secondary | ICD-10-CM | POA: Insufficient documentation

## 2017-05-29 DIAGNOSIS — K5730 Diverticulosis of large intestine without perforation or abscess without bleeding: Secondary | ICD-10-CM | POA: Insufficient documentation

## 2017-05-29 HISTORY — DX: Anemia, unspecified: D64.9

## 2017-05-29 HISTORY — DX: Bronchitis, not specified as acute or chronic: J40

## 2017-05-29 HISTORY — DX: Adverse effect of unspecified anesthetic, initial encounter: T41.45XA

## 2017-05-29 HISTORY — DX: Umbilical hernia without obstruction or gangrene: K42.9

## 2017-05-29 HISTORY — DX: Unspecified osteoarthritis, unspecified site: M19.90

## 2017-05-29 HISTORY — PX: COLONOSCOPY WITH PROPOFOL: SHX5780

## 2017-05-29 HISTORY — DX: Other complications of anesthesia, initial encounter: T88.59XA

## 2017-05-29 SURGERY — COLONOSCOPY WITH PROPOFOL
Anesthesia: Monitor Anesthesia Care

## 2017-05-29 MED ORDER — ONDANSETRON HCL 4 MG/2ML IJ SOLN
INTRAMUSCULAR | Status: DC | PRN
  Administered 2017-05-29: 4 mg via INTRAVENOUS

## 2017-05-29 MED ORDER — SODIUM CHLORIDE 0.9 % IV SOLN: INTRAVENOUS | Status: DC

## 2017-05-29 MED ORDER — LACTATED RINGERS IV SOLN
INTRAVENOUS | Status: DC
  Administered 2017-05-29: 1000 mL via INTRAVENOUS

## 2017-05-29 MED ORDER — PROPOFOL 10 MG/ML IV BOLUS
INTRAVENOUS | Status: DC | PRN
  Administered 2017-05-29 (×3): 20 mg via INTRAVENOUS

## 2017-05-29 MED ORDER — ONDANSETRON HCL 4 MG/2ML IJ SOLN
INTRAMUSCULAR | Status: AC
  Filled 2017-05-29: qty 2

## 2017-05-29 MED ORDER — PROPOFOL 10 MG/ML IV BOLUS
INTRAVENOUS | Status: AC
Start: 2017-05-29 — End: ?
  Filled 2017-05-29: qty 20

## 2017-05-29 MED ORDER — PROPOFOL 500 MG/50ML IV EMUL
INTRAVENOUS | Status: DC | PRN
  Administered 2017-05-29: 125 ug/kg/min via INTRAVENOUS

## 2017-05-29 MED ORDER — PROPOFOL 10 MG/ML IV BOLUS
INTRAVENOUS | Status: AC
  Filled 2017-05-29: qty 40

## 2017-05-29 MED ORDER — OMEPRAZOLE 20 MG PO CPDR: 20.0000 mg | DELAYED_RELEASE_CAPSULE | Freq: Every day | ORAL | 5 refills | Status: DC

## 2017-05-29 SURGICAL SUPPLY — 21 items

## 2017-05-29 NOTE — Anesthesia Preprocedure Evaluation (Signed)
Anesthesia Evaluation  Patient identified by MRN, date of birth, ID band Patient awake    Reviewed: Allergy & Precautions, NPO status , Patient's Chart, lab work & pertinent test results  Airway Mallampati: I  TM Distance: >3 FB Neck ROM: Full    Dental   Pulmonary sleep apnea , Current Smoker,    Pulmonary exam normal        Cardiovascular hypertension, Pt. on medications Normal cardiovascular exam     Neuro/Psych    GI/Hepatic   Endo/Other    Renal/GU      Musculoskeletal   Abdominal   Peds  Hematology   Anesthesia Other Findings   Reproductive/Obstetrics                             Anesthesia Physical Anesthesia Plan  ASA: III  Anesthesia Plan: MAC   Post-op Pain Management:    Induction: Intravenous  PONV Risk Score and Plan: 1 and Ondansetron, Dexamethasone and Treatment may vary due to age or medical condition  Airway Management Planned:   Additional Equipment:   Intra-op Plan:   Post-operative Plan:   Informed Consent: I have reviewed the patients History and Physical, chart, labs and discussed the procedure including the risks, benefits and alternatives for the proposed anesthesia with the patient or authorized representative who has indicated his/her understanding and acceptance.     Plan Discussed with: CRNA and Surgeon  Anesthesia Plan Comments:         Anesthesia Quick Evaluation

## 2017-05-29 NOTE — Transfer of Care (Signed)
Immediate Anesthesia Transfer of Care Note  Patient: Davin Muramoto  Procedure(s) Performed: COLONOSCOPY WITH PROPOFOL (N/A )  Patient Location: PACU  Anesthesia Type:MAC  Level of Consciousness: awake, alert  and patient cooperative  Airway & Oxygen Therapy: Patient Spontanous Breathing and Patient connected to face mask oxygen  Post-op Assessment: Report given to RN and Post -op Vital signs reviewed and stable  Post vital signs: Reviewed and stable  Last Vitals:  Vitals:   05/29/17 0924 05/29/17 0925  BP:  140/72  Pulse:  83  Resp:  18  Temp:    SpO2: 100% 100%    Last Pain:  Vitals:   05/29/17 0925  TempSrc: Oral         Complications: No apparent anesthesia complications

## 2017-05-29 NOTE — Op Note (Signed)
[facility] Patient Name: Tiffany Coleman Procedure Date: 05/29/2017 MRN: [id number] Attending MD: Lear Ng , MD Date of Birth: Feb 20, 1956 CSN: [id number] Age: 61 Admit Type: Outpatient Procedure:                Colonoscopy Indications:              High risk colon cancer surveillance: Personal                            history of colonic polyps, Last colonoscopy:                            October 2014 Providers:                Lear Ng, MD, Burtis Junes, RN, Tinnie Gens, Technician, Edman Circle. Zenia Resides CRNA, CRNA Referring MD:             Tarry Kos. Avbuere MD Medicines:                Propofol per Anesthesia, Monitored Anesthesia Care Complications:            No immediate complications. Estimated Blood Loss:     Estimated blood loss: none. Procedure:                Pre-Anesthesia Assessment:                           - Prior to the procedure, a History and Physical                            was performed, and patient medications and                            allergies were reviewed. The patient's tolerance of                            previous anesthesia was also reviewed. The risks                            and benefits of the procedure and the sedation                            options and risks were discussed with the patient.                            All questions were answered, and informed consent                            was obtained. Prior Anticoagulants: The patient has                            taken no previous anticoagulant or antiplatelet  agents. ASA Grade Assessment: III - A patient with                            severe systemic disease. After reviewing the risks                            and benefits, the patient was deemed in                            satisfactory condition to undergo the procedure.                           After obtaining informed consent, the  colonoscope                            was passed under direct vision. Throughout the                            procedure, the patient's blood pressure, pulse, and                            oxygen saturations were monitored continuously. The                            EC-[id number] ([id number]) scope was introduced through                            the anus and advanced to the the cecum, identified                            by appendiceal orifice and ileocecal valve. The                            colonoscopy was somewhat difficult due to                            significant looping, a tortuous colon and fair                            prep. Successful completion of the procedure was                            aided by straightening and shortening the scope to                            obtain bowel loop reduction and lavage. The patient                            tolerated the procedure well. The quality of the                            bowel preparation was fair and fair but repeated  irrigation led to a good and adequate prep. The                            ileocecal valve, appendiceal orifice, and rectum                            were photographed. Scope In: 8:57:23 AM Scope Out: 9:17:41 AM Total Procedure Duration: 0 hours 20 minutes 18 seconds  Findings:      The perianal and digital rectal examinations were normal.      A 6 mm polyp was found in the descending colon. The polyp was sessile.       The polyp was removed with a hot snare. Resection and retrieval were       complete. Estimated blood loss: none.      A few small-mouthed diverticula were found in the sigmoid colon and       cecum.      Internal hemorrhoids were found during retroflexion. The hemorrhoids       were medium-sized and Grade I (internal hemorrhoids that do not       prolapse). Impression:               - Preparation of the colon was fair.                           - One 6 mm polyp  in the descending colon, removed                            with a hot snare. Resected and retrieved.                           - Diverticulosis in the sigmoid colon.                           - Internal hemorrhoids. Moderate Sedation:      N/A- Per Anesthesia Care Recommendation:           - Await pathology results.                           - High fiber diet.                           - Repeat colonoscopy for surveillance based on                            pathology results.                           - No aspirin, ibuprofen, naproxen, or other                            non-steroidal anti-inflammatory drugs for 1 week.                           - Patient has a contact number available for  emergencies. The signs and symptoms of potential                            delayed complications were discussed with the                            patient. Return to normal activities tomorrow.                            Written discharge instructions were provided to the                            patient. Procedure Code(s):        --- Professional ---                           [id number], Colonoscopy, flexible; with removal of                            tumor(s), polyp(s), or other lesion(s) by snare                            technique Diagnosis Code(s):        --- Professional ---                           Z86.010, Personal history of colonic polyps                           D12.4, Benign neoplasm of descending colon                           K64.0, First degree hemorrhoids                           K57.30, Diverticulosis of large intestine without                            perforation or abscess without bleeding CPT copyright 2016 [employer]. All rights reserved. The codes documented in this report are preliminary and upon coder review may  be revised to meet current compliance requirements. Lear Ng, MD 05/29/2017 9:34:42 AM This report  has been signed electronically. Number of Addenda: 0

## 2017-05-29 NOTE — Interval H&P Note (Signed)
History and Physical Interval Note:  05/29/2017 8:28 AM  Tiffany Coleman  has presented today for surgery, with the diagnosis of History of colon polyps  The various methods of treatment have been discussed with the patient and family. After consideration of risks, benefits and other options for treatment, the patient has consented to  Procedure(s): COLONOSCOPY WITH PROPOFOL (N/A) as a surgical intervention .  The patient's history has been reviewed, patient examined, no change in status, stable for surgery.  I have reviewed the patient's chart and labs.  Questions were answered to the patient's satisfaction.     Krebs C.

## 2017-05-29 NOTE — Anesthesia Postprocedure Evaluation (Signed)
Anesthesia Post Note  Patient: Tiffany Coleman  Procedure(s) Performed: COLONOSCOPY WITH PROPOFOL (N/A )     Patient location during evaluation: PACU Anesthesia Type: MAC Level of consciousness: awake and alert Pain management: pain level controlled Vital Signs Assessment: post-procedure vital signs reviewed and stable Respiratory status: spontaneous breathing, nonlabored ventilation, respiratory function stable and patient connected to nasal cannula oxygen Cardiovascular status: stable and blood pressure returned to baseline Postop Assessment: no apparent nausea or vomiting Anesthetic complications: no    Last Vitals:  Vitals:   05/29/17 0940 05/29/17 0950  BP: (!) 142/82 (!) 154/92  Pulse: 65 68  Resp: 18 (!) 28  Temp:    SpO2: 100% 96%    Last Pain:  Vitals:   05/29/17 0925  TempSrc: Oral                 Cindra Austad DAVID

## 2017-05-29 NOTE — H&P (Signed)
Date of Initial H&P: 05/22/17  History reviewed, patient examined, no change in status, stable for surgery.

## 2017-05-29 NOTE — Patient Instructions (Addendum)
It was a pleasure to see you today patient - For your problems with sleeping, I am referring you for a sleep study. Try taking melatonin and taking shorter naps ( about 40 minutes) and avoiding caffeine  - We will also send a request for you to see a dentist.  - I have printed a refill for your omeprazole  - Please call our clinic if you have any problems or questions, we may be able to help you and keep you from a long emergency room wait. Our clinic and after hours phone number is [phone]       Mediterranean Diet  Why follow it? Research shows. . Those who follow the Mediterranean diet have a reduced risk of heart disease  . The diet is associated with a reduced incidence of Parkinson's and Alzheimer's diseases . People following the diet may have longer life expectancies and lower rates of chronic diseases  . The Dietary Guidelines for Americans recommends the Mediterranean diet as an eating plan to promote health and prevent disease  What Is the Mediterranean Diet?  . Healthy eating plan based on typical foods and recipes of Mediterranean-style cooking . The diet is primarily a plant based diet; these foods should make up a majority of meals   Starches - Plant based foods should make up a majority of meals - They are an important sources of vitamins, minerals, energy, antioxidants, and fiber - Choose whole grains, foods high in fiber and minimally processed items  - Typical grain sources include wheat, oats, barley, corn, brown rice, bulgar, farro, millet, polenta, couscous  - Various types of beans include chickpeas, lentils, fava beans, black beans, white beans   Fruits  Veggies - Large quantities of antioxidant rich fruits & veggies; 6 or more servings  - Vegetables can be eaten raw or lightly drizzled with oil and cooked  - Vegetables common to the traditional Mediterranean Diet include: artichokes, arugula, beets, broccoli, brussel sprouts, cabbage, carrots, celery, collard  greens, cucumbers, eggplant, kale, leeks, lemons, lettuce, mushrooms, okra, onions, peas, peppers, potatoes, pumpkin, radishes, rutabaga, shallots, spinach, sweet potatoes, turnips, zucchini - Fruits common to the Mediterranean Diet include: apples, apricots, avocados, cherries, clementines, dates, figs, grapefruits, grapes, melons, nectarines, oranges, peaches, pears, pomegranates, strawberries, tangerines  Fats - Replace butter and margarine with healthy oils, such as olive oil, canola oil, and tahini  - Limit nuts to no more than a handful a day  - Nuts include walnuts, almonds, pecans, pistachios, pine nuts  - Limit or avoid candied, honey roasted or heavily salted nuts - Olives are central to the [employer] - can be eaten whole or used in a variety of dishes   Meats Protein - Limiting red meat: no more than a few times a month - When eating red meat: choose lean cuts and keep the portion to the size of deck of cards - Eggs: approx. 0 to 4 times a week  - Fish and lean poultry: at least 2 a week  - Healthy protein sources include, chicken, [place], lean beef, lamb - Increase intake of seafood such as tuna, salmon, trout, mackerel, shrimp, scallops - Avoid or limit high fat processed meats such as sausage and bacon  Dairy - Include moderate amounts of low fat dairy products  - Focus on healthy dairy such as fat free yogurt, skim milk, low or reduced fat cheese - Limit dairy products higher in fat such as whole or 2% milk, cheese, ice cream  Alcohol -  Moderate amounts of red wine is ok  - No more than 5 oz daily for women (all ages) and men older than age 35  - No more than 10 oz of wine daily for men younger than 40  Other - Limit sweets and other desserts  - Use herbs and spices instead of salt to flavor foods  - Herbs and spices common to the traditional Mediterranean Diet include: basil, bay leaves, chives, cloves, cumin, fennel, garlic, lavender, marjoram, mint, oregano, parsley,  pepper, rosemary, sage, savory, sumac, tarragon, thyme   It's not just a diet, it's a lifestyle:  . The Mediterranean diet includes lifestyle factors typical of those in the region  . Foods, drinks and meals are best eaten with others and savored . Daily physical activity is important for overall good health . This could be strenuous exercise like running and aerobics . This could also be more leisurely activities such as walking, housework, yard-work, or taking the stairs . Moderation is the key; a balanced and healthy diet accommodates most foods and drinks . Consider portion sizes and frequency of consumption of certain foods   Meal Ideas & Options:  . Breakfast:  o Whole wheat toast or whole wheat English muffins with peanut butter & hard boiled egg o Steel cut oats topped with apples & cinnamon and skim milk  o Fresh fruit: banana, strawberries, melon, berries, peaches  o Smoothies: strawberries, bananas, greek yogurt, peanut butter o Low fat greek yogurt with blueberries and granola  o Egg white omelet with spinach and mushrooms o Breakfast couscous: whole wheat couscous, apricots, skim milk, cranberries  . Sandwiches:  o Hummus and grilled vegetables (peppers, zucchini, squash) on whole wheat bread   o Grilled chicken on whole wheat pita with lettuce, tomatoes, cucumbers or tzatziki  o Tuna salad on whole wheat bread: tuna salad made with greek yogurt, olives, red peppers, capers, green onions o Garlic rosemary lamb pita: lamb sauted with garlic, rosemary, salt & pepper; add lettuce, cucumber, greek yogurt to pita - flavor with lemon juice and black pepper  . Seafood:  o Mediterranean grilled salmon, seasoned with garlic, basil, parsley, lemon juice and black pepper o Shrimp, lemon, and spinach whole-grain pasta salad made with low fat greek yogurt  o Seared scallops with lemon orzo  o Seared tuna steaks seasoned salt, pepper, coriander topped with tomato mixture of olives,  tomatoes, olive oil, minced garlic, parsley, green onions and cappers  . Meats:  o Herbed greek chicken salad with kalamata olives, cucumber, feta  o Red bell peppers stuffed with spinach, bulgur, lean ground beef (or lentils) & topped with feta   o Kebabs: skewers of chicken, tomatoes, onions, zucchini, squash  o [place] burgers: made with red onions, mint, dill, lemon juice, feta cheese topped with roasted red peppers . Vegetarian o Cucumber salad: cucumbers, artichoke hearts, celery, red onion, feta cheese, tossed in olive oil & lemon juice  o Hummus and whole grain pita points with a greek salad (lettuce, tomato, feta, olives, cucumbers, red onion) o Lentil soup with celery, carrots made with vegetable broth, garlic, salt and pepper  o Tabouli salad: parsley, bulgur, mint, scallions, cucumbers, tomato, radishes, lemon juice, olive oil, salt and pepper.

## 2017-05-29 NOTE — Discharge Instructions (Signed)
Colonoscopy, Adult, Care After This sheet gives you information about how to care for yourself after your procedure. Your doctor may also give you more specific instructions. If you have problems or questions, call your doctor. Follow these instructions at home: General instructions   For the first 24 hours after the procedure: ? Do not drive or use machinery. ? Do not sign important documents. ? Do not drink alcohol. ? Do your daily activities more slowly than normal. ? Eat foods that are soft and easy to digest. ? Rest often.  Take over-the-counter or prescription medicines only as told by your doctor.  It is up to you to get the results of your procedure. Ask your doctor, or the department performing the procedure, when your results will be ready. To help cramping and bloating:  Try walking around.  Put heat on your belly (abdomen) as told by your doctor. Use a heat source that your doctor recommends, such as a moist heat pack or a heating pad. ? Put a towel between your skin and the heat source. ? Leave the heat on for 20-30 minutes. ? Remove the heat if your skin turns bright red. This is especially important if you cannot feel pain, heat, or cold. You can get burned. Eating and drinking  Drink enough fluid to keep your pee (urine) clear or pale yellow.  Return to your normal diet as told by your doctor. Avoid heavy or fried foods that are hard to digest.  Avoid drinking alcohol for as long as told by your doctor. Contact a doctor if:  You have blood in your poop (stool) 2-3 days after the procedure. Get help right away if:  You have more than a small amount of blood in your poop.  You see large clumps of tissue (blood clots) in your poop.  Your belly is swollen.  You feel sick to your stomach (nauseous).  You throw up (vomit).  You have a fever.  You have belly pain that gets worse, and medicine does not help your pain. This information is not intended to  replace advice given to you by your health care provider. Make sure you discuss any questions you have with your health care provider. Document Released: 09/02/2010 Document Revised: 04/24/2016 Document Reviewed: 04/24/2016 Elsevier Interactive Patient Education  2017 [employer].

## 2017-05-30 DIAGNOSIS — K219 Gastro-esophageal reflux disease without esophagitis: Secondary | ICD-10-CM | POA: Insufficient documentation

## 2017-05-30 DIAGNOSIS — K08109 Complete loss of teeth, unspecified cause, unspecified class: Secondary | ICD-10-CM | POA: Insufficient documentation

## 2017-05-30 NOTE — Assessment & Plan Note (Signed)
Self discontinued sertraline. Appears anxious today but she denies feelings of anxiety and GAD -7 is 2.  - continue to monitor

## 2017-05-30 NOTE — Assessment & Plan Note (Signed)
Diagnosed on sleep study about 2 years ago, she did not have insurance at that time so the process of CPAP fitting was not completed. Reports insomnia, daytime fatigue and headaches. Will need a repeat sleep study as the prior is likely too old to cover CPAP. Requesting Lorrin Mais, has used this in the past but has not yet tried melatonin. - referral for sleep study - trial of melatonin - can consider ambien as needed after sleep apnea is being treated properly and she has better compliance with proper sleep hygiene but trying to avoid this medication

## 2017-05-30 NOTE — Assessment & Plan Note (Signed)
Has multiple missing teeth, partials are broken. No visible dental carries or tooth infections but she is in need of new hardware. - placed referral for dentistry, she request to go to [facility]

## 2017-05-30 NOTE — Assessment & Plan Note (Signed)
Does have discomfort related to the mesh, this comes and goes. Has constipation at times and colonoscopy today revealed diverticulosisand internal hemorrhoid. -encouraged miralax

## 2017-05-30 NOTE — Assessment & Plan Note (Signed)
BP Readings from Last 3 Encounters:  05/29/17 124/74  05/29/17 (!) 154/92  03/06/17 (!) 150/81  Blood pressure well controlled today  -continue Lisinopril 5 mg qd

## 2017-05-30 NOTE — Assessment & Plan Note (Signed)
Controlled with omeprazole, she has heartburn when she does not take this medication. refilled omeprazole

## 2017-05-31 ENCOUNTER — Encounter (HOSPITAL_COMMUNITY): Payer: Self-pay | Admitting: Gastroenterology

## 2017-06-01 NOTE — Progress Notes (Signed)
Internal Medicine Clinic Attending  Case discussed with Dr. Blum at the time of the visit.  We reviewed the resident's history and exam and pertinent patient test results.  I agree with the assessment, diagnosis, and plan of care documented in the resident's note. 

## 2017-06-15 ENCOUNTER — Other Ambulatory Visit: Payer: Self-pay | Admitting: Obstetrics and Gynecology

## 2017-06-15 DIAGNOSIS — Z1231 Encounter for screening mammogram for malignant neoplasm of breast: Secondary | ICD-10-CM

## 2017-07-03 ENCOUNTER — Telehealth: Payer: Self-pay | Admitting: *Deleted

## 2017-07-03 NOTE — Telephone Encounter (Signed)
Phone noted entered in error while tracking referral - no call made or received at this time.Yvonna Alanis, RN, 07/03/17, 3:28 P

## 2017-07-04 ENCOUNTER — Institutional Professional Consult (permissible substitution): Payer: Self-pay | Admitting: Neurology

## 2017-07-04 ENCOUNTER — Telehealth: Payer: Self-pay

## 2017-07-04 NOTE — Telephone Encounter (Signed)
Pt did not show for their appt with Dr. Rexene Alberts today.

## 2017-07-09 NOTE — Addendum Note (Signed)
Addended by: Hulan Fray on: 07/09/2017 08:37 AM   Modules accepted: Orders

## 2017-07-10 ENCOUNTER — Encounter: Payer: Self-pay | Admitting: Neurology

## 2017-07-12 ENCOUNTER — Ambulatory Visit (HOSPITAL_COMMUNITY)
Admission: RE | Admit: 2017-07-12 | Discharge: 2017-07-12 | Disposition: A | Discharge status: Home / Self Care | Payer: Self-pay | Source: Ambulatory Visit | Attending: Obstetrics and Gynecology | Admitting: Obstetrics and Gynecology

## 2017-07-12 ENCOUNTER — Encounter (HOSPITAL_COMMUNITY): Payer: Self-pay

## 2017-07-12 ENCOUNTER — Other Ambulatory Visit (HOSPITAL_COMMUNITY): Payer: Self-pay | Admitting: *Deleted

## 2017-07-12 ENCOUNTER — Ambulatory Visit
Admission: RE | Admit: 2017-07-12 | Discharge: 2017-07-12 | Disposition: A | Discharge status: Home / Self Care | Payer: No Typology Code available for payment source | Source: Ambulatory Visit | Attending: Obstetrics and Gynecology | Admitting: Obstetrics and Gynecology

## 2017-07-12 VITALS — BP 120/78 | Temp 98.2°F | Ht 63.0 in | Wt 211.8 lb

## 2017-07-12 DIAGNOSIS — N6452 Nipple discharge: Secondary | ICD-10-CM

## 2017-07-12 DIAGNOSIS — N644 Mastodynia: Secondary | ICD-10-CM

## 2017-07-12 DIAGNOSIS — Z1239 Encounter for other screening for malignant neoplasm of breast: Secondary | ICD-10-CM

## 2017-07-12 DIAGNOSIS — Z1231 Encounter for screening mammogram for malignant neoplasm of breast: Secondary | ICD-10-CM

## 2017-07-12 NOTE — Patient Instructions (Signed)
Explained breast self awareness with Gaylan Gerold. Patient did not need a Pap smear today due to last Pap smear was 10/04/2016. Let her know [facility] will cover Pap smears every 3 years unless has a history of abnormal Pap smears. Referred patient to the [facility] for diagnostic mammogram and possible left breast ultrasound. Appointment scheduled for Friday, July 20, 2017 at [place]. Patient aware of appointment and will be there. Discussed smoking cessation with patient. Referred to the [facility] Quitline and gave resources to free smoking cessation classes at [facility]. Cherye Gaertner verbalized understanding.  Alpha Chouinard, Arvil Chaco, RN 1:08 PM

## 2017-07-12 NOTE — Progress Notes (Signed)
Complaints of left nipple and outer breast pain for around 2 months that comes and goes. Patient rates the pain at a 5 out of 10. Patient also stated she has some bilateral breast soreness and nipple pain occasionally that typically occurs every month around the time she used to have her menstrual cycle. Patient stated she noticed some white discharge in her bra from her left breast around two months that she has never noticed before.   Pap Smear: Pap smear not completed today. Last Pap smear was 10/04/2016 at [facility] and normal. Per patient has no history of an abnormal Pap smear. Patient stated she has a history of a hysterectomy over 25 years ago that only her uterus was removed due to fibroids and AUB. Patient stated she still has her cervix and bilateral ovaries. Last Pap smear result is in Epic.  Physical exam: Breasts Breasts symmetrical. No skin abnormalities bilateral breasts. No nipple retraction bilateral breasts. No nipple discharge bilateral breasts. Unable to express any nipple discharge on exam. No lymphadenopathy. No lumps palpated bilateral breasts. Complaints of left nipple and outer breast pain on exam. Referred patient to the [facility] for diagnostic mammogram and possible left breast ultrasound. Appointment scheduled for Friday, July 20, 2017 at [place].      Pelvic/Bimanual No Pap smear completed today since last Pap smear was 10/04/2016. Pap smear not indicated per [facility] guidelines.   Smoking History: Patient is a current some day smoker. Discussed smoking cessation with patient. Referred to the [facility] Quitline and gave resources to free smoking cessation classes at [facility].  Patient Navigation: Patient education provided. Access to services provided for patient through [facility] program.   Colorectal Cancer Screening: Per patient had a colonoscopy completed 2 months ago by Dr. Michail Sermon at [facility]. Patient stated she has a  history of polyps in the colon. No complaints today.

## 2017-07-13 ENCOUNTER — Encounter (HOSPITAL_COMMUNITY): Payer: Self-pay | Admitting: *Deleted

## 2017-07-20 ENCOUNTER — Ambulatory Visit
Admission: RE | Admit: 2017-07-20 | Discharge: 2017-07-20 | Disposition: A | Discharge status: Home / Self Care | Payer: No Typology Code available for payment source | Source: Ambulatory Visit | Attending: Obstetrics and Gynecology | Admitting: Obstetrics and Gynecology

## 2017-07-20 ENCOUNTER — Inpatient Hospital Stay: Admission: RE | Admit: 2017-07-20 | Payer: No Typology Code available for payment source | Source: Ambulatory Visit

## 2017-07-20 DIAGNOSIS — N6452 Nipple discharge: Secondary | ICD-10-CM

## 2017-07-20 DIAGNOSIS — N644 Mastodynia: Secondary | ICD-10-CM

## 2017-11-02 ENCOUNTER — Ambulatory Visit (INDEPENDENT_AMBULATORY_CARE_PROVIDER_SITE_OTHER): Payer: Self-pay | Admitting: Internal Medicine

## 2017-11-02 ENCOUNTER — Other Ambulatory Visit: Payer: Self-pay

## 2017-11-02 DIAGNOSIS — Z6838 Body mass index (BMI) 38.0-38.9, adult: Secondary | ICD-10-CM

## 2017-11-02 DIAGNOSIS — R058 Other specified cough: Secondary | ICD-10-CM | POA: Insufficient documentation

## 2017-11-02 DIAGNOSIS — M549 Dorsalgia, unspecified: Secondary | ICD-10-CM

## 2017-11-02 DIAGNOSIS — R05 Cough: Secondary | ICD-10-CM

## 2017-11-02 LAB — GLUCOSE, CAPILLARY: GLUCOSE-CAPILLARY: 86 mg/dL (ref 65–99)

## 2017-11-02 LAB — POCT GLYCOSYLATED HEMOGLOBIN (HGB A1C): Hemoglobin A1C: 5.9

## 2017-11-02 MED ORDER — BENZONATATE 100 MG PO CAPS: 100.0000 mg | ORAL_CAPSULE | Freq: Three times a day (TID) | ORAL | 1 refills | Status: AC | PRN

## 2017-11-02 NOTE — Assessment & Plan Note (Addendum)
She requests diabetes screening and appears indicated in this obese patient.  Prior A1c was normal about 1 year ago.  Plan: - Check A1c >> 5.9. In the Pre-DM range.  Recommend continued surveillance and dietary as well as lifestyle modifications.

## 2017-11-02 NOTE — Patient Instructions (Signed)
FOLLOW-UP INSTRUCTIONS When: next appointment with Dr Hetty Ely that is available.  For: general follow up What to bring: medications  I have prescribed Tessalon pearls for your cough as needed.  You can also take tylenol and/or ibuprofen for back pain.  If cough not better in a few weeks, please come back to see [place].   We are checking your A1c, we'll let you know the results.

## 2017-11-02 NOTE — Assessment & Plan Note (Signed)
Patient returned from [place] about 3 weeks ago and while there had a really bad cold or other viral illness.  Her symptoms have resolved but she still complains of a intermittent, dry cough, worse at night when lying flat.  Over the past week, she's had no congestion, headaches, fever, chills.  She is getting ready to travel back to [place] soon.  She initially took Nyquil but has not taken anything since.  I suspect she has a post-viral cough that should gradually improve.  Plan: - Given Rx for Tessalon pearls to take as needed - Otherwise recommended she continue regular treatment for her allergies - RTC in 2-4 weeks if cough still ongoing.

## 2017-11-02 NOTE — Progress Notes (Signed)
Medicine attending: Medical history, presenting problems, physical findings, and medications, reviewed with resident physician Dr Jule Ser on the day of the patient visit and I concur with his evaluation and management plan.

## 2017-11-02 NOTE — Progress Notes (Signed)
   CC: cough and requests a1c testing  HPI:  [name] is a 62 y.o. woman with a past medical history listed below here today for cough and requesting a1c testing.  For details of today's visit and the status of her chronic medical issues please refer to the assessment and plan.   Past Medical History:  Diagnosis Date  . Anemia yrs ago  . Arthritis    knees  . Bronchitis    hx  . Complication of anesthesia    trouble breathing when wake up needs breathing tx when wakes up  . Hx of colonic polyps 2015  . Obstructive sleep apnea 06/02/2016   No CPAP used  . Recurrent ventral hernia 09/08/2013  . Umbilical hernia    Review of Systems:  Review of Systems  Constitutional: Negative for chills and fever.  HENT: Negative for congestion and sinus pain.   Respiratory: Positive for cough. Negative for sputum production and shortness of breath.   Cardiovascular: Negative for chest pain.  Musculoskeletal: Positive for back pain.     Physical Exam:  Vitals:   11/02/17 1011  BP: (!) 142/89  Pulse: 65  Temp: 97.9 F (36.6 C)  TempSrc: Oral  SpO2: 99%  Weight: 218 lb 11.2 oz (99.2 kg)  Height: _0  (1.6 m)   Physical Exam  Constitutional: She is oriented to person, place, and time and well-developed, well-nourished, and in no distress.  Cardiovascular: Normal rate and regular rhythm.  Pulmonary/Chest: Effort normal and breath sounds normal. She has no wheezes. She has no rales.  Neurological: She is alert and oriented to person, place, and time.  Psychiatric: Mood and affect normal.     Assessment & Plan:   See Encounters Tab for problem based charting.  Patient discussed with Dr. Beryle Beams.  Post-viral cough syndrome Patient returned from [place] about 3 weeks ago and while there had a really bad cold or other viral illness.  Her symptoms have resolved but she still complains of a intermittent, dry cough, worse at night when lying flat.  Over the past week, she's  had no congestion, headaches, fever, chills.  She is getting ready to travel back to [place] soon.  She initially took Nyquil but has not taken anything since.  I suspect she has a post-viral cough that should gradually improve.  Plan: - Given Rx for Tessalon pearls to take as needed - Otherwise recommended she continue regular treatment for her allergies - RTC in 2-4 weeks if cough still ongoing.   Morbid obesity ([place]) She requests diabetes screening and appears indicated in this obese patient.  Prior A1c was normal about 1 year ago.  Plan: - Check A1c >> 5.9. In the Pre-DM range.  Recommend continued surveillance and dietary as well as lifestyle modifications.

## 2017-11-06 ENCOUNTER — Telehealth: Payer: Self-pay | Admitting: Speech-Language Pathologist

## 2017-11-06 NOTE — Telephone Encounter (Signed)
I was unable to locate any Test Completed for a CPAP  Study for this patient.  She has not called back to confirm where she had one done at.  I have been unsuccessful obtaining any information and have checked the chart as well.   I have also reached out to the patient again for a response.  Can a referral be placed for this patient to the [facility] as we have no records?

## 2017-11-20 NOTE — Addendum Note (Signed)
Addended by: Meryl Dare on: 11/20/2017 07:13 PM   Modules accepted: Orders

## 2017-11-20 NOTE — Telephone Encounter (Signed)
Okay thank you for letting me know. Do I need to place a neurology referral for this?

## 2017-11-20 NOTE — Addendum Note (Signed)
Addended by: Meryl Dare on: 11/20/2017 07:11 PM   Modules accepted: Orders

## 2017-11-22 NOTE — Telephone Encounter (Signed)
Yes.  Go ahead and place that referral.  [facility] will contact our office back if they are unable to accept this referral.  Thanks for you help!

## 2017-11-25 NOTE — Addendum Note (Signed)
Addended by: Meryl Dare on: 11/25/2017 08:25 PM   Modules accepted: Orders

## 2017-12-05 ENCOUNTER — Encounter: Payer: Self-pay | Admitting: *Deleted

## 2017-12-14 NOTE — Telephone Encounter (Signed)
Ref placed on 11/25/2017 to [facility].

## 2018-01-02 ENCOUNTER — Encounter: Payer: Self-pay | Admitting: *Deleted

## 2018-02-13 ENCOUNTER — Encounter: Payer: Self-pay | Admitting: Internal Medicine

## 2018-02-13 ENCOUNTER — Ambulatory Visit: Payer: No Typology Code available for payment source

## 2018-05-21 ENCOUNTER — Encounter: Payer: No Typology Code available for payment source | Admitting: Internal Medicine

## 2018-06-21 ENCOUNTER — Ambulatory Visit: Payer: No Typology Code available for payment source

## 2018-07-04 ENCOUNTER — Ambulatory Visit: Payer: No Typology Code available for payment source

## 2018-07-05 ENCOUNTER — Ambulatory Visit: Payer: No Typology Code available for payment source

## 2019-02-06 ENCOUNTER — Encounter: Payer: Self-pay | Admitting: *Deleted

## 2019-02-07 ENCOUNTER — Other Ambulatory Visit: Payer: Self-pay | Admitting: Internal Medicine

## 2019-02-07 DIAGNOSIS — Z1231 Encounter for screening mammogram for malignant neoplasm of breast: Secondary | ICD-10-CM

## 2019-02-08 ENCOUNTER — Other Ambulatory Visit: Payer: Self-pay

## 2019-02-08 ENCOUNTER — Encounter (HOSPITAL_COMMUNITY): Payer: Self-pay | Admitting: Physician Assistant

## 2019-02-08 ENCOUNTER — Ambulatory Visit (HOSPITAL_COMMUNITY)
Admission: EM | Admit: 2019-02-08 | Discharge: 2019-02-08 | Disposition: A | Discharge status: Home / Self Care | Payer: Medicare Other | Attending: Physician Assistant | Admitting: Physician Assistant

## 2019-02-08 DIAGNOSIS — J0140 Acute pansinusitis, unspecified: Secondary | ICD-10-CM | POA: Diagnosis not present

## 2019-02-08 DIAGNOSIS — M7989 Other specified soft tissue disorders: Secondary | ICD-10-CM

## 2019-02-08 MED ORDER — DOXYCYCLINE HYCLATE 100 MG PO CAPS: 100.0000 mg | ORAL_CAPSULE | Freq: Two times a day (BID) | ORAL | 0 refills | Status: DC

## 2019-02-08 MED ORDER — ALBUTEROL SULFATE (2.5 MG/3ML) 0.083% IN NEBU: 2.5000 mg | INHALATION_SOLUTION | Freq: Four times a day (QID) | RESPIRATORY_TRACT | 0 refills | Status: DC | PRN

## 2019-02-08 MED ORDER — IPRATROPIUM BROMIDE 0.06 % NA SOLN: 2.0000 | Freq: Four times a day (QID) | NASAL | 0 refills | Status: DC

## 2019-02-08 MED ORDER — PREDNISONE 50 MG PO TABS: 50.0000 mg | ORAL_TABLET | Freq: Every day | ORAL | 0 refills | Status: DC

## 2019-02-08 NOTE — ED Provider Notes (Signed)
[facility]    CSN: [id number] Arrival date & time: 02/08/19  1706     History   Chief Complaint Chief Complaint  Patient presents with  . Sinus Issues  . Hand/Feet Swelling    HPI Tiffany Coleman is a 63 y.o. female.   63 year old female comes in with multiple complaints.  1 week history of sinus pressure, rhinorrhea, nasal congestion, postnasal drip.  Minimal cough, states itchy throat causes the cough.  Denies shortness of breath, wheezing.  Denies fever, chills, body aches.  Denies sick contact.  Works at [employer], no obvious culprit contact.  Current some day smoker.  Few day history of swelling to bilateral hands and feet.  States called primary care, and was given Lasix, unknown dosage.  Lasix with some improvement, but continues to have swelling.  States swelling is worse throughout the day, and improves overnight.  Denies erythema, warmth.  Denies injury/trauma.  Denies chest pain, shortness of breath, orthopnea.  Has been working extra hours.     Past Medical History:  Diagnosis Date  . Anemia yrs ago  . Arthritis    knees  . Bronchitis    hx  . Complication of anesthesia    trouble breathing when wake up needs breathing tx when wakes up  . Hx of colonic polyps 2015  . Obstructive sleep apnea 06/02/2016   No CPAP used  . Recurrent ventral hernia 09/08/2013  . Umbilical hernia     Patient Active Problem List   Diagnosis Date Noted  . 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 Teeth missing 05/30/2017  . GERD (gastroesophageal reflux disease) 05/30/2017  . Hypertension 03/08/2017  . Epigastric pain 03/06/2017  . Seasonal allergies 01/19/2017  . Morbid obesity ([place]) 01/19/2017  . Generalized anxiety disorder 10/05/2016  . History of colonic polyps 06/03/2016  . Obstructive sleep apnea 06/02/2016  . Recurrent ventral hernia 09/08/2013    Past Surgical History:  Procedure Laterality Date  . ABDOMINAL HYSTERECTOMY     partial  . BREAST  SURGERY Bilateral    cyst removed  . COLON SURGERY    . COLONOSCOPY WITH PROPOFOL N/A 05/29/2017   Procedure: COLONOSCOPY WITH PROPOFOL;  Surgeon: Wilford Corner, MD;  Location: [facility];  Service: Endoscopy;  Laterality: N/A;  . FINGER SURGERY     right pinkie x 3  . HERNIA REPAIR    . KNEE SURGERY Right 2017  . PARTIAL COLECTOMY Left ~2004   for obstruction  . right foot surgery  yrs ago  . stomach tumur     [id number] benign    OB History    Gravida  7   Para      Term      Preterm      AB  3   Living  3     SAB  2   TAB  1   Ectopic      Multiple      Live Births  4            Home Medications    Prior to Admission medications   Medication Sig Start Date End Date Taking? Authorizing Provider  albuterol (PROVENTIL HFA;VENTOLIN HFA) 108 (90 Base) MCG/ACT inhaler Inhale 2 puffs into the lungs every 6 (six) hours as needed for wheezing or shortness of breath. Patient not taking: Reported on 07/12/2017 01/19/17   Minus Liberty, MD  albuterol (PROVENTIL) (2.5 MG/3ML) 0.083% nebulizer solution Take 3 mLs (2.5 mg total) by nebulization every 6 (six)  hours as needed for wheezing or shortness of breath. 02/08/19   Tasia Catchings, Amy V, PA-C  Cetirizine HCl (ZYRTEC ALLERGY) 10 MG CAPS Take 1 capsule (10 mg total) by mouth daily as needed. Patient not taking: Reported on 07/12/2017 01/19/17   Minus Liberty, MD  diphenhydrAMINE (BENADRYL) 25 MG tablet Take 1 tablet (25 mg total) by mouth every 6 (six) hours as needed. Patient not taking: Reported on 07/12/2017 03/06/17   Ledell Noss, MD  doxycycline (VIBRAMYCIN) 100 MG capsule Take 1 capsule (100 mg total) by mouth 2 (two) times daily. Fill 7/1 02/12/19   Tasia Catchings, Amy V, PA-C  fluticasone (FLONASE) 50 MCG/ACT nasal spray USE 1 SPRAY INTO BOTH NOSTRILS EVERY DAY Patient not taking: Reported on 07/12/2017 01/21/17   Ledell Noss, MD  ipratropium (ATROVENT) 0.06 % nasal spray Place 2 sprays into both nostrils 4 (four) times  daily. 02/08/19   Tasia Catchings, Amy V, PA-C  omeprazole (PRILOSEC) 20 MG capsule Take 1 capsule (20 mg total) by mouth daily. Patient not taking: Reported on 07/12/2017 05/29/17   Ledell Noss, MD  predniSONE (DELTASONE) 50 MG tablet Take 1 tablet (50 mg total) by mouth daily. 02/08/19   Ok Edwards, PA-C    Family History Family History  Problem Relation Age of Onset  . Deep vein thrombosis Mother   . Diabetes Mother   . Hypertension Mother   . Varicose Veins Mother   . Cancer Father        colon  . Hypertension Sister   . Hyperlipidemia Brother   . Hypertension Daughter   . Heart attack Son   . Cancer Maternal Uncle     Social History Social History   Tobacco Use  . Smoking status: Current Some Day Smoker    Years: 20.00    Types: Cigarettes  . Smokeless tobacco: Never Used  . Tobacco comment: smokes 2-3 cigs per day   Substance Use Topics  . Alcohol use: Yes    Comment: Occ  . Drug use: No     Allergies   Latex, Penicillins, Tape, and Codeine   Review of Systems Review of Systems  Reason unable to perform ROS: See HPI as above.     Physical Exam Triage Vital Signs ED Triage Vitals [02/08/19 1811]  Enc Vitals Group     BP (!) 157/79     Pulse Rate 66     Resp 17     Temp 98.6 F (37 C)     Temp Source Oral     SpO2 96 %     Weight      Height      Head Circumference      Peak Flow      Pain Score 7     Pain Loc      Pain Edu?      Excl. in [place]?    No data found.  Updated Vital Signs BP (!) 157/79 (BP Location: Left Arm)   Pulse 66   Temp 98.6 F (37 C) (Oral)   Resp 17   SpO2 96%   Visual Acuity Right Eye Distance:   Left Eye Distance:   Bilateral Distance:    Right Eye Near:   Left Eye Near:    Bilateral Near:     Physical Exam Constitutional:      General: She is not in acute distress.    Appearance: Normal appearance. She is not ill-appearing, toxic-appearing or diaphoretic.  HENT:     Head: Normocephalic  and atraumatic.     Nose:      Right Sinus: Maxillary sinus tenderness and frontal sinus tenderness present.     Left Sinus: Maxillary sinus tenderness and frontal sinus tenderness present.     Mouth/Throat:     Mouth: Mucous membranes are moist.     Pharynx: Oropharynx is clear. Uvula midline.  Neck:     Musculoskeletal: Normal range of motion and neck supple.  Cardiovascular:     Rate and Rhythm: Normal rate and regular rhythm.     Heart sounds: Normal heart sounds. No murmur. No friction rub. No gallop.   Pulmonary:     Effort: Pulmonary effort is normal. No accessory muscle usage, prolonged expiration, respiratory distress or retractions.     Comments: Lungs clear to auscultation without adventitious lung sounds. Musculoskeletal:     Comments: No obvious swelling, erythema, contusion to bilateral hand, lower extremity.  No pitting edema.  No tenderness to palpation.  Full range of motion of elbow, wrist, fingers, ankle, toes.  Strength normal and equal bilaterally.  Sensation intact and equal bilaterally.  Radial pulse 2+, cap refill less than 2 seconds.  Pedal pulse 2+.  Neurological:     General: No focal deficit present.     Mental Status: She is alert and oriented to person, place, and time.      UC Treatments / Results  Labs (all labs ordered are listed, but only abnormal results are displayed) Labs Reviewed - No data to display  EKG None  Radiology No results found.  Procedures Procedures (including critical care time)  Medications Ordered in UC Medications - No data to display  Initial Impression / Assessment and Plan / UC Course  I have reviewed the triage vital signs and the nursing notes.  Pertinent labs & imaging results that were available during my care of the patient were reviewed by me and considered in my medical decision making (see chart for details).    Will treat sinusitis with prednisone.  Rx of doxycycline sent to pharmacy, can fill in 4 days if symptoms not improving.   Discussed with patient, currently does not need cover testing given history and exam.  However, discussed symptoms to monitor, and return precautions given.  Patient expresses understanding and agrees to plan.  Normal muscular skeletal exam without swelling, pitting edema to upper and lower extremity.  Good pulses, neurologically intact.  Will have patient monitor, and follow-up with PCP for further evaluation.  Return precautions given.  Patient expresses understanding and agrees to plan.  Final Clinical Impressions(s) / UC Diagnoses   Final diagnoses:  Acute non-recurrent pansinusitis  Bilateral swelling of feet  Bilateral hand swelling   ED Prescriptions    Medication Sig Dispense Auth. Provider   predniSONE (DELTASONE) 50 MG tablet Take 1 tablet (50 mg total) by mouth daily. 5 tablet Yu, Amy V, PA-C   doxycycline (VIBRAMYCIN) 100 MG capsule Take 1 capsule (100 mg total) by mouth 2 (two) times daily. Fill 7/1 20 capsule Yu, Amy V, PA-C   ipratropium (ATROVENT) 0.06 % nasal spray Place 2 sprays into both nostrils 4 (four) times daily. 15 mL Yu, Amy V, PA-C   albuterol (PROVENTIL) (2.5 MG/3ML) 0.083% nebulizer solution Take 3 mLs (2.5 mg total) by nebulization every 6 (six) hours as needed for wheezing or shortness of breath. 75 mL Tobin Chad, [place] 02/08/19 1928

## 2019-02-08 NOTE — Discharge Instructions (Addendum)
Start prednisone as directed. Atrovent for nasal congestion. Doxycycline will fill on 7/1, if symptoms not improving by then, can take to cover for bacterial infection. If symptoms improving by then, do not fill the doxycycline.   Your symptoms are not worrisome for COVID at this time. If develop other cold symptoms such as cough, fever, chills, body aches, please self quarantine for 7 days since symptoms started AND more than 72 hours of no fever and cough prior to leaving the house. You can call COVID hotline [phone]) or use [facility] visit online if you have questions, or symptoms worsens to determine where you should seek care. If experiencing shortness of breath, trouble breathing, call 911 and provide them with your current situation.   No alarming signs for leg and hand swelling. Continue to elevate to help with symptoms. If experiencing chest pain, shortness of breath, one sided leg swelling, go to the ED for further evaluation needed.

## 2019-02-08 NOTE — ED Triage Notes (Signed)
Pt presents with swelling in both of her hands and feet.  Pt also complains of symptoms of sinus infection; facial pain/pressure, headache, ear pain and nasal drainage.

## 2019-03-28 ENCOUNTER — Ambulatory Visit: Payer: Self-pay

## 2019-04-22 ENCOUNTER — Ambulatory Visit: Payer: Self-pay

## 2019-04-25 ENCOUNTER — Other Ambulatory Visit: Payer: Self-pay | Admitting: Nurse Practitioner

## 2019-04-25 ENCOUNTER — Ambulatory Visit (INDEPENDENT_AMBULATORY_CARE_PROVIDER_SITE_OTHER): Payer: Medicare Other | Admitting: Nurse Practitioner

## 2019-04-25 ENCOUNTER — Ambulatory Visit
Admission: RE | Admit: 2019-04-25 | Discharge: 2019-04-25 | Disposition: A | Discharge status: Home / Self Care | Payer: Medicare Other | Source: Ambulatory Visit | Attending: Internal Medicine | Admitting: Internal Medicine

## 2019-04-25 ENCOUNTER — Encounter: Payer: Self-pay | Admitting: Nurse Practitioner

## 2019-04-25 ENCOUNTER — Other Ambulatory Visit (HOSPITAL_COMMUNITY)
Admission: RE | Admit: 2019-04-25 | Discharge: 2019-04-25 | Disposition: A | Discharge status: Home / Self Care | Payer: Medicare Other | Source: Ambulatory Visit | Attending: Nurse Practitioner | Admitting: Nurse Practitioner

## 2019-04-25 ENCOUNTER — Other Ambulatory Visit: Payer: Self-pay

## 2019-04-25 VITALS — BP 150/93 | HR 82 | Ht 63.0 in | Wt 219.2 lb

## 2019-04-25 DIAGNOSIS — Z13820 Encounter for screening for osteoporosis: Secondary | ICD-10-CM

## 2019-04-25 DIAGNOSIS — N898 Other specified noninflammatory disorders of vagina: Secondary | ICD-10-CM | POA: Insufficient documentation

## 2019-04-25 DIAGNOSIS — Z1231 Encounter for screening mammogram for malignant neoplasm of breast: Secondary | ICD-10-CM

## 2019-04-25 DIAGNOSIS — Z01419 Encounter for gynecological examination (general) (routine) without abnormal findings: Secondary | ICD-10-CM

## 2019-04-25 DIAGNOSIS — Z6838 Body mass index (BMI) 38.0-38.9, adult: Secondary | ICD-10-CM

## 2019-04-25 DIAGNOSIS — R030 Elevated blood-pressure reading, without diagnosis of hypertension: Secondary | ICD-10-CM | POA: Insufficient documentation

## 2019-04-25 DIAGNOSIS — R1031 Right lower quadrant pain: Secondary | ICD-10-CM

## 2019-04-25 DIAGNOSIS — G4489 Other headache syndrome: Secondary | ICD-10-CM

## 2019-04-25 DIAGNOSIS — F17200 Nicotine dependence, unspecified, uncomplicated: Secondary | ICD-10-CM

## 2019-04-25 NOTE — Progress Notes (Signed)
GYNECOLOGY ANNUAL PREVENTATIVE CARE ENCOUNTER NOTE  Subjective:   Tiffany Coleman is a 63 y.o. [phone] female here for a routine annual gynecologic exam.  Current complaints: pain in RLQ that has been there for several weeks and is worsening.  Feels the pain when she is walking as a ball in the RLQ - she thinks it is her ovary that is hurting.  Recently she has had the pain when lying down also and rates it as 8/10.  She does not have the pain right now and has not been able to reproduce the pain when pushing on her abdomen.  Additionally today she has nasal congestion and a headache that she has had for several days.  She took ibuprofen 800 mg PO yesterday but it did not resolve the headache.  It is persistent and today it is hurting on the left side of her head and behind her left eye.  She has not taken any pain medication today.  Has not seen her PCP in awhile.  Has taken BP medication before but stopped it.  Has been seen in the Urgent Care at the end of June ane was treated for a sinus infection with prednisone and doxycycline.   Denies abnormal vaginal bleeding, discharge, problems with intercourse or other gynecologic concerns.  Has not been sexually active in several years.   Gynecologic History No LMP recorded. Patient has had a hysterectomy. Contraception: has had a hysterectomy and is postmenopausal Last Pap: 2018. Results were: normal Last mammogram: has an appointment today for mammogram  Obstetric History OB History  Gravida Para Term Preterm AB Living  _0 SAB TAB Ectopic Multiple Live Births  _1 # Outcome Date GA Lbr Len/2nd Weight Sex Delivery Anes PTL Lv  7 Gravida           6 Gravida         LIV  5 Gravida         LIV  4 Gravida         LIV  3 TAB           2 SAB           1 SAB             Past Medical History:  Diagnosis Date  . Anemia yrs ago  . Arthritis    knees  . Bronchitis    hx  . Complication of anesthesia    trouble  breathing when wake up needs breathing tx when wakes up  . Hx of colonic polyps 2015  . Obstructive sleep apnea 06/02/2016   No CPAP used  . Recurrent ventral hernia 09/08/2013  . Umbilical hernia     Past Surgical History:  Procedure Laterality Date  . ABDOMINAL HYSTERECTOMY     partial  . BREAST SURGERY Bilateral    cyst removed  . COLON SURGERY    . COLONOSCOPY WITH PROPOFOL N/A 05/29/2017   Procedure: COLONOSCOPY WITH PROPOFOL;  Surgeon: Wilford Corner, MD;  Location: [facility];  Service: Endoscopy;  Laterality: N/A;  . FINGER SURGERY     right pinkie x 3  . HERNIA REPAIR    . KNEE SURGERY Right 2017  . PARTIAL COLECTOMY Left ~2004   for obstruction  . right foot surgery  yrs ago  . stomach tumur     [id number] benign    Current Outpatient Medications  on File Prior to Visit  Medication Sig Dispense Refill  . albuterol (PROVENTIL HFA;VENTOLIN HFA) 108 (90 Base) MCG/ACT inhaler Inhale 2 puffs into the lungs every 6 (six) hours as needed for wheezing or shortness of breath. (Patient not taking: Reported on 07/12/2017) 1 Inhaler 2  . albuterol (PROVENTIL) (2.5 MG/3ML) 0.083% nebulizer solution Take 3 mLs (2.5 mg total) by nebulization every 6 (six) hours as needed for wheezing or shortness of breath. (Patient not taking: Reported on 04/25/2019) 75 mL 0  . Cetirizine HCl (ZYRTEC ALLERGY) 10 MG CAPS Take 1 capsule (10 mg total) by mouth daily as needed. (Patient not taking: Reported on 07/12/2017) 30 capsule 2  . diphenhydrAMINE (BENADRYL) 25 MG tablet Take 1 tablet (25 mg total) by mouth every 6 (six) hours as needed. (Patient not taking: Reported on 07/12/2017) 30 tablet 0  . doxycycline (VIBRAMYCIN) 100 MG capsule Take 1 capsule (100 mg total) by mouth 2 (two) times daily. Fill 7/1 (Patient not taking: Reported on 04/25/2019) 20 capsule 0  . fluticasone (FLONASE) 50 MCG/ACT nasal spray USE 1 SPRAY INTO BOTH NOSTRILS EVERY DAY (Patient not taking: Reported on 07/12/2017) 48 g  1  . ipratropium (ATROVENT) 0.06 % nasal spray Place 2 sprays into both nostrils 4 (four) times daily. (Patient not taking: Reported on 04/25/2019) 15 mL 0  . omeprazole (PRILOSEC) 20 MG capsule Take 1 capsule (20 mg total) by mouth daily. (Patient not taking: Reported on 07/12/2017) 30 capsule 5  . predniSONE (DELTASONE) 50 MG tablet Take 1 tablet (50 mg total) by mouth daily. (Patient not taking: Reported on 04/25/2019) 5 tablet 0   No current facility-administered medications on file prior to visit.     Allergies  Allergen Reactions  . Latex Hives and Itching  . Penicillins Hives, Itching and Other (See Comments)    Has patient had a PCN reaction causing immediate rash, facial/tongue/throat swelling, SOB or lightheadedness with hypotension: Yes Has patient had a PCN reaction causing severe rash involving mucus membranes or skin necrosis: Unk Has patient had a PCN reaction that required hospitalization: Unk Has patient had a PCN reaction occurring within the last 10 years: No If all of the above answers are "NO", then may proceed with Cephalosporin use.   . Tape Hives, Itching and Other (See Comments)    Coban wrap, in particular  . Codeine Itching    Social History   Socioeconomic History  . Marital status: Single    Spouse name: Not on file  . Number of children: Not on file  . Years of education: Not on file  . Highest education level: Not on file  Occupational History  . Not on file  Social Needs  . Financial resource strain: Not on file  . Food insecurity    Worry: Not on file    Inability: Not on file  . Transportation needs    Medical: Not on file    Non-medical: Not on file  Tobacco Use  . Smoking status: Current Some Day Smoker    Years: 20.00    Types: Cigarettes  . Smokeless tobacco: Never Used  . Tobacco comment: smokes 2-3 cigs per day   Substance and Sexual Activity  . Alcohol use: Yes    Comment: Occ  . Drug use: No  . Sexual activity: Not Currently   Lifestyle  . Physical activity    Days per week: 0 days    Minutes per session: 0 min  . Stress: Not at all  Relationships  . Social connections    Talks on phone: More than three times a week    Gets together: More than three times a week    Attends religious service: Never    Active member of club or organization: Yes    Attends meetings of clubs or organizations: 1 to 4 times per year    Relationship status: Divorced  . Intimate partner violence    Fear of current or ex partner: No    Emotionally abused: No    Physically abused: No    Forced sexual activity: No  Other Topics Concern  . Not on file  Social History Narrative  . Not on file    Family History  Problem Relation Age of Onset  . Deep vein thrombosis Mother   . Diabetes Mother   . Hypertension Mother   . Varicose Veins Mother   . Cancer Father        colon  . Hypertension Sister   . Hyperlipidemia Brother   . Hypertension Daughter   . Heart attack Son   . Cancer Maternal Uncle     The following portions of the patient's history were reviewed and updated as appropriate: allergies, current medications, past family history, past medical history, past social history, past surgical history and problem list.  Review of Systems Pertinent items noted in HPI and remainder of comprehensive ROS otherwise negative.   Objective:  BP (!) 150/93   Pulse 82   Ht _0  (1.6 m)   Wt 219 lb 3.2 oz (99.4 kg)   BMI 38.83 kg/m  CONSTITUTIONAL: Well-developed, well-nourished female in no acute distress.  HENT:  Normocephalic, atraumatic, External right and left ear normal.  EYES: Conjunctivae and EOM are normal. Pupils are equal, round.  No scleral icterus.  SKIN: Skin is warm and dry. No rash noted. Not diaphoretic. No erythema. No pallor. NEUROLOGIC: Alert and oriented to person, place, and time. Normal reflexes, muscle tone coordination. No cranial nerve deficit noted. PSYCHIATRIC: Normal mood and affect. Normal  behavior. Normal judgment and thought content. CARDIOVASCULAR: Normal heart rate noted, regular rhythm RESPIRATORY: Clear to auscultation bilaterally. Effort and breath sounds normal, no problems with respiration noted. BREASTS: Symmetric in size. No masses, skin changes, nipple drainage, or lymphadenopathy. ABDOMEN: Soft, no distention noted. Scars from previous abdominal surgeries noted. No tenderness, rebound or guarding.  PELVIC:Unable to slide down on table due to pain in hips.  Normal appearing external genitalia; some redness at introitus but internal tissues seem normal, no obvious discharge, Unable to visualize cervix, but able to palpate cervix on bimanual.  No abnormal discharge noted. Uterus surgically absent. no other palpable masses, no adnexal tenderness. MUSCULOSKELETAL: Normal range of motion. No tenderness.  No cyanosis, clubbing, or edema.    Assessment and Plan:  1. Encounter for annual routine gynecological examination with elevated BP Pap smear in 2018 was normal - not due til 2021 by ASCCP guidelines See primary care ASAP - call today about headache.  Can continue to take pain medication - tylenol or ibuprofen - by the package directions. If headache worsens, go to ER Follow up with PCP about your blood pressure - has taken meds years ago for BP but then insurance lapsed and she stopped taking medication.  - Hemoglobin A1c - Urinalysis - DG BONE DENSITY (DXA); Future - US Pelvis Complete; Future - US Transvaginal Non-OB; Future - CBC  2. Right lower quadrant abdominal pain Will investigate to rule out a GYN cause.  Suspect the pain may be more musculoskeletal in origin  - US Pelvis Complete; Future - US Transvaginal Non-OB; Future  3. Other headache syndrome Has obvious nasal congestion today and was treated in June  for sinus with doxycycline and prednisone.  Has flonase at home and encouraged to use it.  Has had headache for one week and took Ibuprofen 800 mg PO  yesterday but the headache persisted.  Describes on left side of head and behind left eye.  Holds her head periodically.  But able to answer all questions and to walk around unassisted. Advised to call PCP today and discuss headache. If worsens to be seen at Urgent Care or ER  4. Vaginal irritation Vaginal swab done to check for infection.  If no infection and continuing to have  5. BMI 38.0-38.9,adult Weight loss advised Will check HA1C to rule out diabetes  6. Current smoker Smokes one cigarette a day.  Advised to stop all smoking for her best health.  Pap smear due next year or able to stop Pap smears as she is age 23 Mammogram today Routine preventative health maintenance measures emphasized. Please refer to After Visit Summary for other counseling recommendations.    Earlie Server, RN, MSN, NP-BC Nurse Practitioner, [place] for [facility]

## 2019-04-25 NOTE — Progress Notes (Signed)
New patient is in the office for annual, last pap 10-04-16. Pt reports ongoing headache, seeing floaters, and pain in left eye. BP elevated today 150/93. Pt currently not on any meds. Pt reports vaginal irritation and reports right sided pelvic pain near her ovary. Pt reports that she is scheduled for mammogram today.

## 2019-04-26 LAB — URINALYSIS
Bilirubin, UA: NEGATIVE
Glucose, UA: NEGATIVE
Ketones, UA: NEGATIVE
Leukocytes,UA: NEGATIVE
Nitrite, UA: NEGATIVE
Protein,UA: NEGATIVE
RBC, UA: NEGATIVE
Specific Gravity, UA: 1.024 (ref 1.005–1.030)
Urobilinogen, Ur: 0.2 mg/dL (ref 0.2–1.0)
pH, UA: 5 (ref 5.0–7.5)

## 2019-04-26 LAB — CBC
Hematocrit: 37.8 % (ref 34.0–46.6)
Hemoglobin: 12.5 g/dL (ref 11.1–15.9)
MCH: 27.8 pg (ref 26.6–33.0)
MCHC: 33.1 g/dL (ref 31.5–35.7)
MCV: 84 fL (ref 79–97)
Platelets: 219 10*3/uL (ref 150–450)
RBC: 4.5 x10E6/uL (ref 3.77–5.28)
RDW: 13.2 % (ref 11.7–15.4)
WBC: 7.3 10*3/uL (ref 3.4–10.8)

## 2019-04-26 LAB — HEMOGLOBIN A1C
Est. average glucose Bld gHb Est-mCnc: 111 mg/dL
Hgb A1c MFr Bld: 5.5 % (ref 4.8–5.6)

## 2019-04-29 LAB — CERVICOVAGINAL ANCILLARY ONLY
Bacterial vaginitis: NEGATIVE
Candida vaginitis: NEGATIVE
Chlamydia: NEGATIVE
Neisseria Gonorrhea: NEGATIVE
Trichomonas: NEGATIVE

## 2019-05-07 ENCOUNTER — Other Ambulatory Visit: Payer: Medicare Other

## 2019-05-16 ENCOUNTER — Ambulatory Visit
Admission: RE | Admit: 2019-05-16 | Discharge: 2019-05-16 | Disposition: A | Discharge status: Home / Self Care | Payer: Medicare Other | Source: Ambulatory Visit | Attending: Nurse Practitioner | Admitting: Nurse Practitioner

## 2019-05-16 DIAGNOSIS — R1031 Right lower quadrant pain: Secondary | ICD-10-CM

## 2019-05-16 DIAGNOSIS — Z01419 Encounter for gynecological examination (general) (routine) without abnormal findings: Secondary | ICD-10-CM

## 2019-05-19 ENCOUNTER — Other Ambulatory Visit: Payer: Self-pay | Admitting: Physician Assistant

## 2019-05-19 DIAGNOSIS — R1030 Lower abdominal pain, unspecified: Secondary | ICD-10-CM

## 2019-05-30 ENCOUNTER — Other Ambulatory Visit: Payer: Self-pay

## 2019-05-30 ENCOUNTER — Ambulatory Visit
Admission: RE | Admit: 2019-05-30 | Discharge: 2019-05-30 | Disposition: A | Discharge status: Home / Self Care | Payer: Medicare Other | Source: Ambulatory Visit | Attending: Physician Assistant | Admitting: Physician Assistant

## 2019-05-30 ENCOUNTER — Inpatient Hospital Stay: Admission: RE | Admit: 2019-05-30 | Payer: Medicare Other | Source: Ambulatory Visit

## 2019-05-30 ENCOUNTER — Other Ambulatory Visit: Payer: Self-pay | Admitting: Physician Assistant

## 2019-05-30 DIAGNOSIS — R1030 Lower abdominal pain, unspecified: Secondary | ICD-10-CM

## 2019-05-30 MED ORDER — IOPAMIDOL (ISOVUE-300) INJECTION 61%
100.0000 mL | Freq: Once | INTRAVENOUS | Status: AC | PRN
  Administered 2019-05-30: 100 mL via INTRAVENOUS

## 2019-06-03 ENCOUNTER — Other Ambulatory Visit: Payer: Medicare Other

## 2019-07-18 ENCOUNTER — Other Ambulatory Visit: Payer: Medicare Other

## 2019-07-25 ENCOUNTER — Emergency Department (HOSPITAL_COMMUNITY)
Admission: EM | Admit: 2019-07-25 | Discharge: 2019-07-25 | Disposition: A | Discharge status: Home / Self Care | Payer: Medicare Other | Attending: Emergency Medicine | Admitting: Emergency Medicine

## 2019-07-25 ENCOUNTER — Other Ambulatory Visit: Payer: Self-pay

## 2019-07-25 ENCOUNTER — Encounter (HOSPITAL_COMMUNITY): Payer: Self-pay | Admitting: Emergency Medicine

## 2019-07-25 ENCOUNTER — Emergency Department (HOSPITAL_COMMUNITY): Payer: Medicare Other

## 2019-07-25 DIAGNOSIS — Z79899 Other long term (current) drug therapy: Secondary | ICD-10-CM | POA: Insufficient documentation

## 2019-07-25 DIAGNOSIS — I10 Essential (primary) hypertension: Secondary | ICD-10-CM | POA: Insufficient documentation

## 2019-07-25 DIAGNOSIS — R519 Headache, unspecified: Secondary | ICD-10-CM | POA: Diagnosis present

## 2019-07-25 DIAGNOSIS — Z91040 Latex allergy status: Secondary | ICD-10-CM | POA: Insufficient documentation

## 2019-07-25 DIAGNOSIS — G43809 Other migraine, not intractable, without status migrainosus: Secondary | ICD-10-CM | POA: Insufficient documentation

## 2019-07-25 DIAGNOSIS — F17210 Nicotine dependence, cigarettes, uncomplicated: Secondary | ICD-10-CM | POA: Diagnosis not present

## 2019-07-25 DIAGNOSIS — M792 Neuralgia and neuritis, unspecified: Secondary | ICD-10-CM | POA: Diagnosis not present

## 2019-07-25 LAB — CBC
HCT: 38.4 % (ref 36.0–46.0)
Hemoglobin: 12.1 g/dL (ref 12.0–15.0)
MCH: 27.8 pg (ref 26.0–34.0)
MCHC: 31.5 g/dL (ref 30.0–36.0)
MCV: 88.1 fL (ref 80.0–100.0)
Platelets: 251 10*3/uL (ref 150–400)
RBC: 4.36 MIL/uL (ref 3.87–5.11)
RDW: 14 % (ref 11.5–15.5)
WBC: 6.4 10*3/uL (ref 4.0–10.5)
nRBC: 0 % (ref 0.0–0.2)

## 2019-07-25 LAB — URINALYSIS, ROUTINE W REFLEX MICROSCOPIC
Bilirubin Urine: NEGATIVE
Glucose, UA: NEGATIVE mg/dL
Hgb urine dipstick: NEGATIVE
Ketones, ur: NEGATIVE mg/dL
Leukocytes,Ua: NEGATIVE
Nitrite: NEGATIVE
Protein, ur: NEGATIVE mg/dL
Specific Gravity, Urine: 1.018 (ref 1.005–1.030)
pH: 5 (ref 5.0–8.0)

## 2019-07-25 LAB — C-REACTIVE PROTEIN: CRP: 1.3 mg/dL — ABNORMAL HIGH (ref <1.0)

## 2019-07-25 LAB — BASIC METABOLIC PANEL
Anion gap: 10 (ref 5–15)
BUN: 10 mg/dL (ref 8–23)
CO2: 26 mmol/L (ref 22–32)
Calcium: 9.5 mg/dL (ref 8.9–10.3)
Chloride: 105 mmol/L (ref 98–111)
Creatinine, Ser: 0.92 mg/dL (ref 0.44–1.00)
GFR calc Af Amer: >60 mL/min (ref >60)
GFR calc non Af Amer: >60 mL/min (ref >60)
Glucose, Bld: 129 mg/dL — ABNORMAL HIGH (ref 70–99)
Potassium: 3.6 mmol/L (ref 3.5–5.1)
Sodium: 141 mmol/L (ref 135–145)

## 2019-07-25 LAB — SEDIMENTATION RATE: Sed Rate: 32 mm/hr — ABNORMAL HIGH (ref 0–22)

## 2019-07-25 MED ORDER — DIPHENHYDRAMINE HCL 50 MG/ML IJ SOLN
25.0000 mg | Freq: Once | INTRAMUSCULAR | Status: AC
  Administered 2019-07-25: 25 mg via INTRAVENOUS
  Filled 2019-07-25: qty 1

## 2019-07-25 MED ORDER — ACETAMINOPHEN 500 MG PO TABS
1000.0000 mg | ORAL_TABLET | Freq: Once | ORAL | Status: AC
  Administered 2019-07-25: 1000 mg via ORAL
  Filled 2019-07-25: qty 2

## 2019-07-25 MED ORDER — FLUORESCEIN SODIUM 1 MG OP STRP
1.0000 | ORAL_STRIP | Freq: Once | OPHTHALMIC | Status: AC
  Administered 2019-07-25: 1 via OPHTHALMIC
  Filled 2019-07-25: qty 1

## 2019-07-25 MED ORDER — TETRACAINE HCL 0.5 % OP SOLN
2.0000 [drp] | Freq: Once | OPHTHALMIC | Status: AC
  Administered 2019-07-25: 2 [drp] via OPHTHALMIC
  Filled 2019-07-25: qty 4

## 2019-07-25 MED ORDER — METOCLOPRAMIDE HCL 5 MG/ML IJ SOLN
10.0000 mg | Freq: Once | INTRAMUSCULAR | Status: AC
  Administered 2019-07-25: 10 mg via INTRAVENOUS
  Filled 2019-07-25: qty 2

## 2019-07-25 NOTE — ED Provider Notes (Signed)
[facility] EMERGENCY DEPARTMENT Provider Note   CSN: [id number] Arrival date & time: 07/25/19  1208     History Chief Complaint  Patient presents with  . Headache  . Back Pain    Tiffany Coleman is a 63 y.o. female.  HPI Pt is a 63 year old female with PMH of HTN, GERD, OSA who presents to the ED with headache and back pain.  Patient reports over the last week she has had intermittent soreness over her left temple and pressure behind her left eye.  It has been associated with some intermittent left eye blurry vision when prompted on ROS as well as left eye tearing and watering. No current vision changes. She states she has had headaches in the past but they typically do not last this long.  No falls or trauma to her head.  She is not on anticoagulation.  Patient denies any fever during this time. No jaw pain. She has tried taking gabapentin and ibuprofen without much relief.  Of note, patient states in October she was diagnosed with shingles under her left breast and across her left back.  She states she completed therapy and was feeling better for a few weeks for the last 2 weeks has been noticing a burning/itching sensation in her bilateral lower flanks.  No trauma or heavy lifting. Patient also endorses some burning with urination and states when she gets up to use the bathroom she knows she needs to use the bathroom but at times cannot make it and ends up having incontinence.  No hematuria. No hx of kidney stones. No fecal incontinence.  No numbness or tingling.  Patient denies any falls or trauma to her back.  She denies any weakness in her legs.  No saddle anesthesia.   Past Medical History:  Diagnosis Date  . Anemia yrs ago  . Arthritis    knees  . Bronchitis    hx  . Complication of anesthesia    trouble breathing when wake up needs breathing tx when wakes up  . Hx of colonic polyps 2015  . Obstructive sleep apnea 06/02/2016   No CPAP used  .  Recurrent ventral hernia 09/08/2013  . Umbilical hernia     Patient Active Problem List   Diagnosis Date Noted  . Elevated BP without diagnosis of hypertension 04/25/2019  . 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 Teeth missing 05/30/2017  . GERD (gastroesophageal reflux disease) 05/30/2017  . Hypertension 03/08/2017  . Epigastric pain 03/06/2017  . Seasonal allergies 01/19/2017  . Morbid obesity ([place]) 01/19/2017  . Generalized anxiety disorder 10/05/2016  . History of colonic polyps 06/03/2016  . Obstructive sleep apnea 06/02/2016  . Recurrent ventral hernia 09/08/2013    Past Surgical History:  Procedure Laterality Date  . ABDOMINAL HYSTERECTOMY     partial  . BREAST SURGERY Bilateral    cyst removed  . COLON SURGERY    . COLONOSCOPY WITH PROPOFOL N/A 05/29/2017   Procedure: COLONOSCOPY WITH PROPOFOL;  Surgeon: Wilford Corner, MD;  Location: [facility];  Service: Endoscopy;  Laterality: N/A;  . FINGER SURGERY     right pinkie x 3  . HERNIA REPAIR    . KNEE SURGERY Right 2017  . PARTIAL COLECTOMY Left ~2004   for obstruction  . right foot surgery  yrs ago  . stomach tumur     [id number] benign     OB History    Gravida  7   Para  Term      Preterm      AB  3   Living  3     SAB  2   TAB  1   Ectopic      Multiple      Live Births  4           Family History  Problem Relation Age of Onset  . Deep vein thrombosis Mother   . Diabetes Mother   . Hypertension Mother   . Varicose Veins Mother   . Cancer Father        colon  . Hypertension Sister   . Hyperlipidemia Brother   . Hypertension Daughter   . Heart attack Son   . Cancer Maternal Uncle     Social History   Tobacco Use  . Smoking status: Current Some Day Smoker    Years: 20.00    Types: Cigarettes  . Smokeless tobacco: Never Used  . Tobacco comment: smokes 2-3 cigs per day   Substance Use Topics  . Alcohol use: Yes    Comment: Occ  . Drug use: No    Home  Medications Prior to Admission medications   Medication Sig Start Date End Date Taking? Authorizing Provider  acetaminophen (TYLENOL) 325 MG tablet Take 975 mg by mouth every 6 (six) hours as needed for headache (pain).   Yes [provider]  albuterol (PROVENTIL HFA;VENTOLIN HFA) 108 (90 Base) MCG/ACT inhaler Inhale 2 puffs into the lungs every 6 (six) hours as needed for wheezing or shortness of breath. 01/19/17  Yes Minus Liberty, MD  albuterol (PROVENTIL) (2.5 MG/3ML) 0.083% nebulizer solution Take 3 mLs (2.5 mg total) by nebulization every 6 (six) hours as needed for wheezing or shortness of breath. 02/08/19  Yes Yu, Amy V, PA-C  budesonide-formoterol (SYMBICORT) 160-4.5 MCG/ACT inhaler Inhale 2 puffs into the lungs 2 (two) times daily.   Yes [provider]  cetirizine (ZYRTEC) 10 MG tablet Take 10 mg by mouth daily as needed for allergies or rhinitis.  07/08/19  Yes [provider]  gabapentin (NEURONTIN) 300 MG capsule Take 300 mg by mouth 3 (three) times daily as needed (nerve pain).  07/17/19  Yes [provider]  tiZANidine (ZANAFLEX) 4 MG tablet Take 4 mg by mouth 2 (two) times daily as needed for muscle spasms (pain).  07/17/19  Yes [provider]  zolpidem (AMBIEN) 10 MG tablet Take 10 mg by mouth at bedtime as needed for sleep.  07/18/19  Yes [provider]  Cetirizine HCl (ZYRTEC ALLERGY) 10 MG CAPS Take 1 capsule (10 mg total) by mouth daily as needed. Patient not taking: Reported on 07/12/2017 01/19/17   Minus Liberty, MD  diphenhydrAMINE (BENADRYL) 25 MG tablet Take 1 tablet (25 mg total) by mouth every 6 (six) hours as needed. Patient not taking: Reported on 07/12/2017 03/06/17   Ledell Noss, MD  fluticasone [facility]) 50 MCG/ACT nasal spray USE 1 SPRAY INTO BOTH NOSTRILS EVERY DAY Patient not taking: Reported on 07/12/2017 01/21/17   Ledell Noss, MD  ipratropium (ATROVENT) 0.06 % nasal spray Place 2 sprays into both nostrils  4 (four) times daily. Patient not taking: Reported on 04/25/2019 02/08/19   Ok Edwards, PA-C  linaclotide [facility]) 72 MCG capsule Take 72 mcg by mouth daily before breakfast.    [provider]  omeprazole (PRILOSEC) 20 MG capsule Take 1 capsule (20 mg total) by mouth daily. Patient not taking: Reported on 07/12/2017 05/29/17   Ledell Noss,  MD    Allergies    Latex, Penicillins, Tape, and Codeine  Review of Systems   Review of Systems  Constitutional: Negative for chills and fever.  HENT: Negative for congestion.   Eyes: Positive for visual disturbance.  Respiratory: Negative for cough and shortness of breath.   Cardiovascular: Negative for chest pain.  Gastrointestinal: Negative for abdominal pain, diarrhea and vomiting.  Genitourinary: Positive for dysuria and urgency.  Musculoskeletal: Positive for back pain.  Skin: Positive for rash.  Neurological: Positive for headaches.  Psychiatric/Behavioral: Negative for agitation and behavioral problems.    Physical Exam Updated Vital Signs BP (!) 149/80 (BP Location: Right Arm)   Pulse 68   Temp 98.5 F (36.9 C) (Oral)   Resp 16   SpO2 100%   Physical Exam Vitals and nursing note reviewed.  Constitutional:      General: She is not in acute distress.    Appearance: She is well-developed.  HENT:     Head: Normocephalic and atraumatic.     Comments: Mild TTP of left temporal region  Eyes:     Extraocular Movements: Extraocular movements intact.     Conjunctiva/sclera: Conjunctivae normal.     Pupils: Pupils are equal, round, and reactive to light.     Comments: No fluorescein uptake    Cardiovascular:     Rate and Rhythm: Normal rate and regular rhythm.     Heart sounds: No murmur.  Pulmonary:     Effort: Pulmonary effort is normal. No respiratory distress.     Breath sounds: Normal breath sounds.  Abdominal:     Palpations: Abdomen is soft.     Tenderness: There is no abdominal tenderness.  Musculoskeletal:          General: No tenderness or signs of injury. Normal range of motion.     Cervical back: Normal range of motion and neck supple.  Skin:    General: Skin is warm and dry.     Findings: Lesion present.     Comments: Left midback with evidence of scarring/skin darkening consistent with recent zoster infection; no overlying erythema or vesicles Bilateral flanks are tender to light touch, no midline TTP  Neurological:     General: No focal deficit present.     Mental Status: She is alert and oriented to person, place, and time.     Cranial Nerves: No cranial nerve deficit.     Sensory: No sensory deficit.     Motor: No weakness.     Comments: Visual fields intact  Speech without defect No facial asymmetry 5/5 strength throughout Gross sensation intact   Psychiatric:        Mood and Affect: Mood normal.        Behavior: Behavior normal.     ED Results / Procedures / Treatments   Labs (all labs ordered are listed, but only abnormal results are displayed) Labs Reviewed  BASIC METABOLIC PANEL - Abnormal; Notable for the following components:      Result Value   Glucose, Bld 129 (*)    All other components within normal limits  SEDIMENTATION RATE - Abnormal; Notable for the following components:   Sed Rate 32 (*)    All other components within normal limits  C-REACTIVE PROTEIN - Abnormal; Notable for the following components:   CRP 1.3 (*)    All other components within normal limits  CBC  URINALYSIS, ROUTINE W REFLEX MICROSCOPIC    EKG None  Radiology CT Head Wo Contrast  Result Date: 07/25/2019 CLINICAL DATA:  Acute left-sided headache. EXAM: CT HEAD WITHOUT CONTRAST TECHNIQUE: Contiguous axial images were obtained from the base of the skull through the vertex without intravenous contrast. COMPARISON:  10/02/2012 FINDINGS: Brain: No evidence of acute infarction, hemorrhage, hydrocephalus, extra-axial collection or mass lesion/mass effect. Vascular: No hyperdense vessel or  unexpected calcification. Skull: Normal. Negative for fracture or focal lesion. Sinuses/Orbits: Globes and orbits are unremarkable. Mild ethmoid and inferior right frontal sinus mucosal thickening. Other: None. IMPRESSION: 1. No intracranial abnormality. Electronically Signed   By: Lajean Manes M.D.   On: 07/25/2019 19:06    Procedures Procedures (including critical care time)  Medications Ordered in ED Medications  metoCLOPramide (REGLAN) injection 10 mg (10 mg Intravenous Given 07/25/19 1616)  diphenhydrAMINE (BENADRYL) injection 25 mg (25 mg Intravenous Given 07/25/19 1616)  fluorescein ophthalmic strip 1 strip (1 strip Left Eye Given 07/25/19 1620)  tetracaine (PONTOCAINE) 0.5 % ophthalmic solution 2 drop (2 drops Left Eye Given 07/25/19 1616)  acetaminophen (TYLENOL) tablet 1,000 mg (1,000 mg Oral Given 07/25/19 1624)    ED Course  I have reviewed the triage vital signs and the nursing notes.  Pertinent labs & imaging results that were available during my care of the patient were reviewed by me and considered in my medical decision making (see chart for details).    MDM Rules/Calculators/A&P  On arrival, pt is afebrile, HDS.  Regarding headache, patient is well-appearing with no gross neuro deficits.  Headache has been waxing and waning and is on her left temporal region.  Considered: Tension headache, migraine, ocular migraine, cluster headache, giant cell arteritis (patient denies any current vision changes.  But does state on review of systems she has had some intermittent blurry vision out of her left eye). Patient is overall well-appearing with low clinical suspicion for spontaneous intracranial bleed/ICH.  However, given age and no longstanding history of prior headaches, CT head obtained: no acute findings Considered ocular zoster: no fluorescein uptake or evidence of ocular zoster; denies any current vision change or eye pain Patient given migraine cocktail.  Regarding  patient's bilateral low back pain, she has evidence of scarring/healed prior zoster rash of her left mid back.  She states she was diagnosed in October and completed antiviral therapy in November.  Her pain is inferior to her prior rash.  She has slight tenderness to palpation over bilateral flanks with no midline tenderness to palpation.  This may represent posterior herpatic neuralgia although somewhat atypical as her symptoms cross midline.  UA without evidence of UTI.  No CVA tenderness palpation to suggest pyelonephritis, no fever, no systemic symptoms.  Patient described the pain as a throbbing and warmed/burning sensation in her bilateral lower flanks at times.  Patient has no numbness or weakness of her legs.  No saddle anesthesia. She does endorse having urinary incontinence at times, but states she knows she needs to use the restroom but sometimes does not make it to the bathroom in time. No frank incontinence. Low suspicion for cauda equina syndrome clinically.   ESR/CRP obtained to further assess for left temporal headache as well as intermittent vision changes. Both mildly elevated; ESR <50, HA resolved with migraine cocktail. Low clinical suspicion for GCA.   Upon reassessment, patient endorses feeling much better.  Patient reports she has follow-up with her physician later this month.  Discussed using Tylenol or ibuprofen if headache were to return.  Patient is currently prescribed gabapentin for her neuropathic type pain.  Stable for discharge  at this time.  Strict return precautions given.  Final Clinical Impression(s) / ED Diagnoses Final diagnoses:  Neuralgia  Other migraine without status migrainosus, not intractable    Rx / DC Orders ED Discharge Orders    None       Burns Spain, MD 07/25/19 1941    Lucrezia Starch, MD 07/26/19 [phone]

## 2019-07-25 NOTE — ED Triage Notes (Signed)
Patient c/o left frontal headache with tenderness for a few weeks along with lower back pain worsening yesterday. States she recently had shingles. Also having itching and burning sensation with urination for a few days.

## 2019-07-25 NOTE — ED Notes (Signed)
Bag lunch provided with okay from provider.

## 2019-07-25 NOTE — ED Notes (Signed)
Taken to CT at this time.

## 2019-10-31 ENCOUNTER — Other Ambulatory Visit: Payer: Self-pay | Admitting: Surgery

## 2019-10-31 DIAGNOSIS — R1011 Right upper quadrant pain: Secondary | ICD-10-CM

## 2019-11-14 ENCOUNTER — Encounter (HOSPITAL_COMMUNITY)
Admission: RE | Admit: 2019-11-14 | Discharge: 2019-11-14 | Disposition: A | Discharge status: Home / Self Care | Payer: Medicare Other | Source: Ambulatory Visit | Attending: Surgery | Admitting: Surgery

## 2019-11-14 ENCOUNTER — Other Ambulatory Visit: Payer: Self-pay

## 2019-11-14 DIAGNOSIS — R1011 Right upper quadrant pain: Secondary | ICD-10-CM | POA: Diagnosis not present

## 2019-11-14 MED ORDER — TECHNETIUM TC 99M MEBROFENIN IV KIT
5.4000 | PACK | Freq: Once | INTRAVENOUS | Status: AC | PRN
  Administered 2019-11-14: 5.4 via INTRAVENOUS

## 2019-11-18 ENCOUNTER — Other Ambulatory Visit: Payer: Self-pay | Admitting: Surgery

## 2019-11-18 DIAGNOSIS — K5651 Intestinal adhesions [bands], with partial obstruction: Secondary | ICD-10-CM

## 2019-12-19 ENCOUNTER — Other Ambulatory Visit: Payer: Medicare Other

## 2020-01-02 ENCOUNTER — Inpatient Hospital Stay: Admission: RE | Admit: 2020-01-02 | Payer: Medicare Other | Source: Ambulatory Visit

## 2020-01-11 IMAGING — CT CT ABD-PELV W/ CM
1 of 3 series · 13 of 32 positions shown, 19 images · IV contrast (iopamidol)
Comparison: 09/12/2013

CLINICAL DATA: Right upper quadrant/epigastric pain, prior
hysterectomy and hernia repair

EXAM:
CT ABDOMEN AND PELVIS WITH CONTRAST
TECHNIQUE: Multidetector CT imaging of the abdomen and pelvis was performed
using the standard protocol following bolus administration of
intravenous contrast.
CONTRAST:  100mL [id number] IOPAMIDOL ([id number]) INJECTION 61%

[Series 2: abd/pelvis w/cm · axial · 0.95mm/px · z∈[+662,+1082]mm · 13 of 98 slices shown, 19 images]
[im 7/98  soft-tissue]
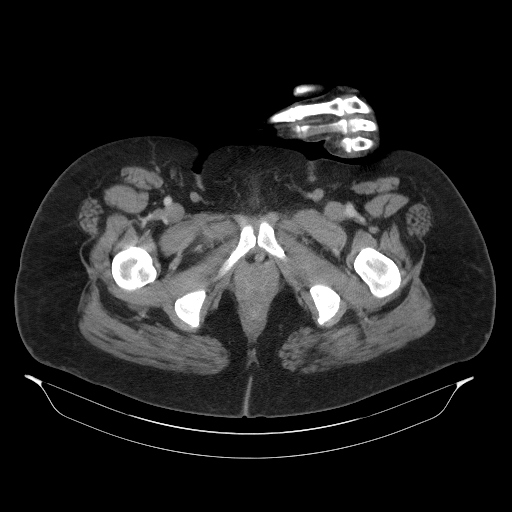
[im 7/98  bone]
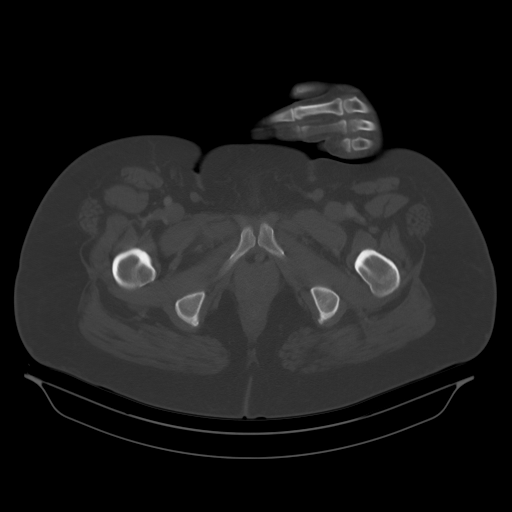
[im 13/98  soft-tissue]
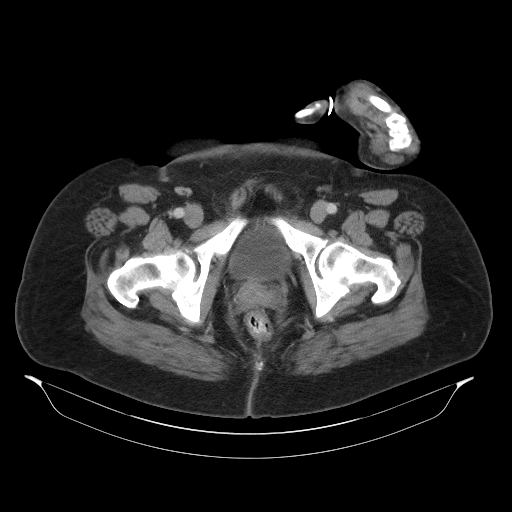
[im 20/98  soft-tissue]
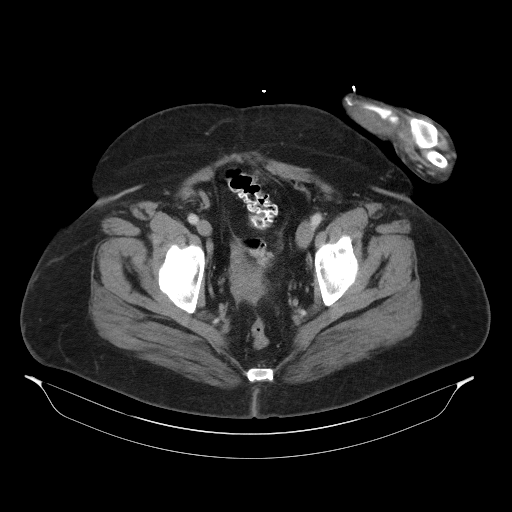
[im 26/98  soft-tissue]
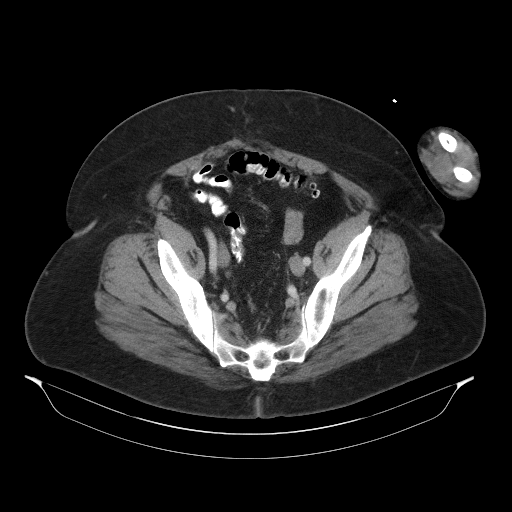
[im 33/98  soft-tissue]
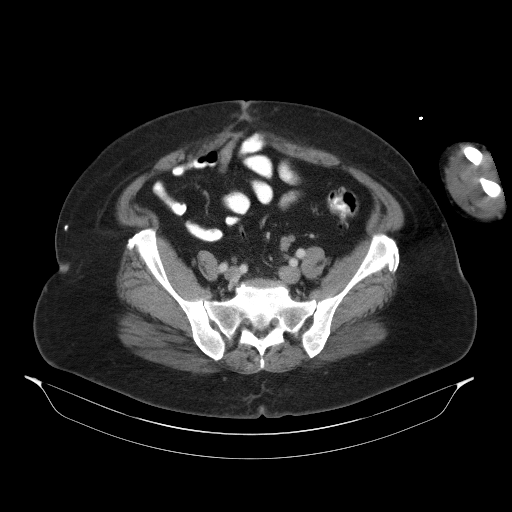
[im 39/98  soft-tissue]
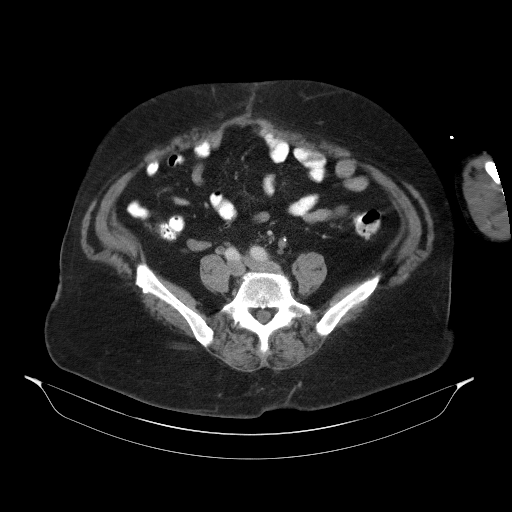
[im 52/98  soft-tissue]
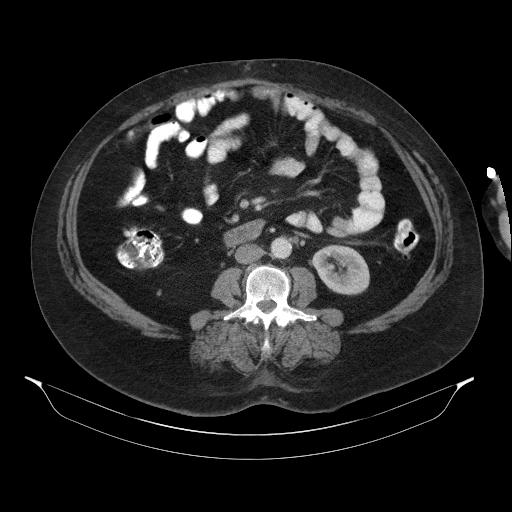
[im 59/98  soft-tissue]
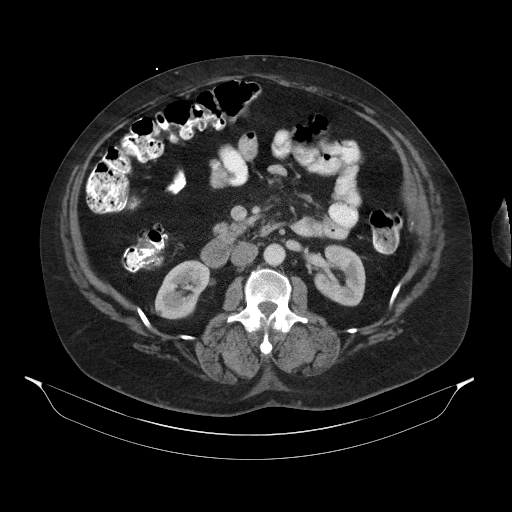
[im 65/98  soft-tissue]
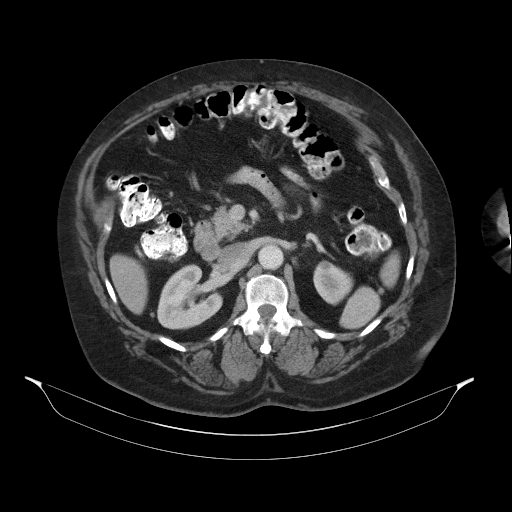
[im 65/98  bone]
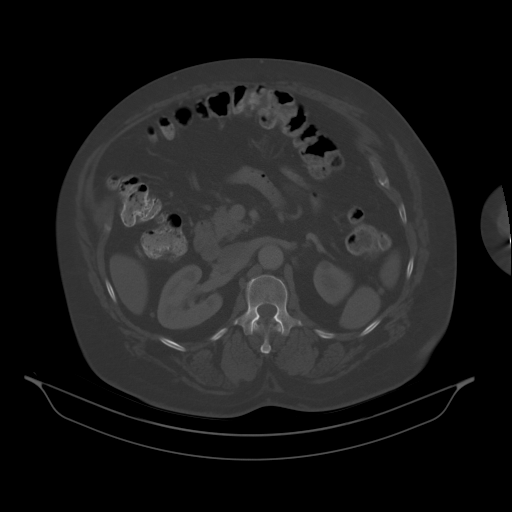
[im 72/98  soft-tissue]
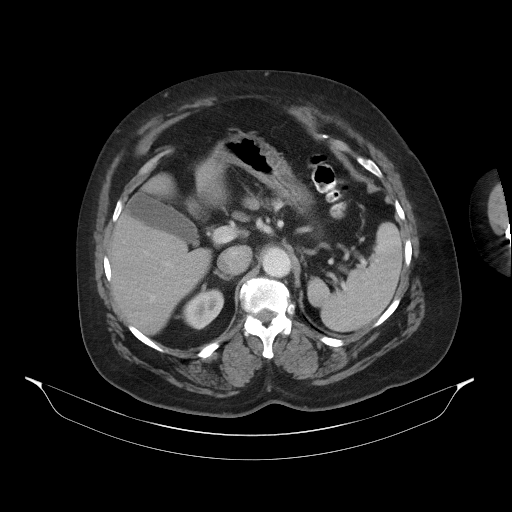
[im 72/98  lung]
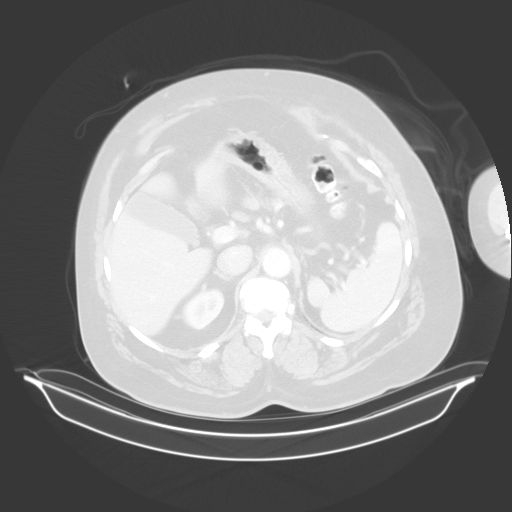
[im 78/98  soft-tissue]
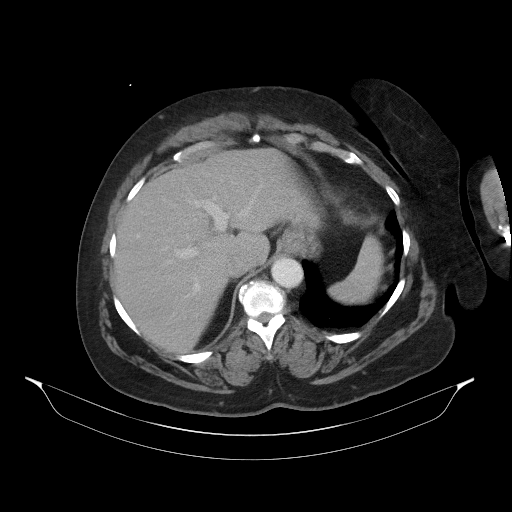
[im 78/98  lung]
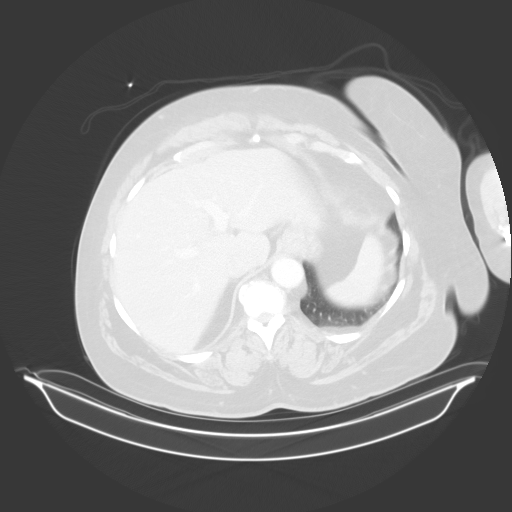
[im 85/98  soft-tissue]
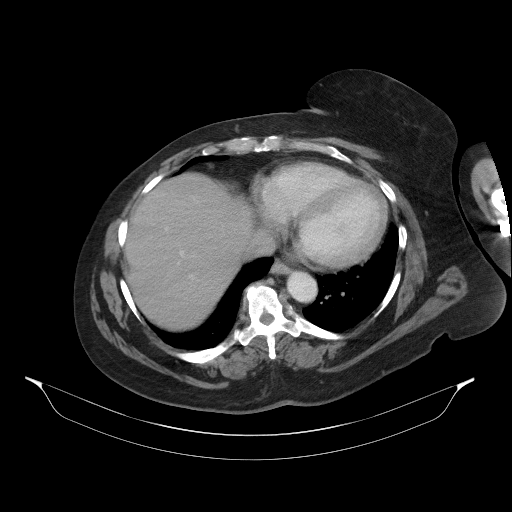
[im 85/98  lung]
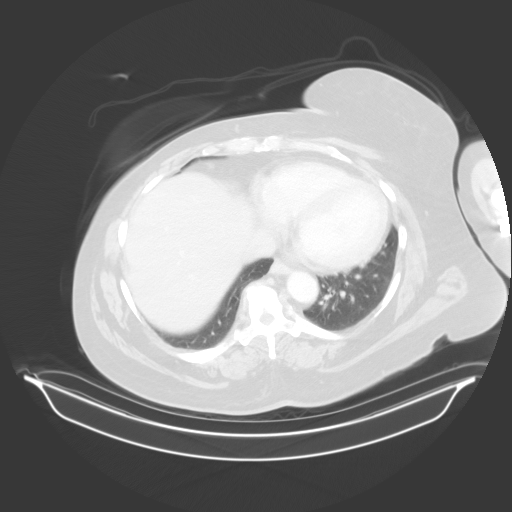
[im 91/98  soft-tissue]
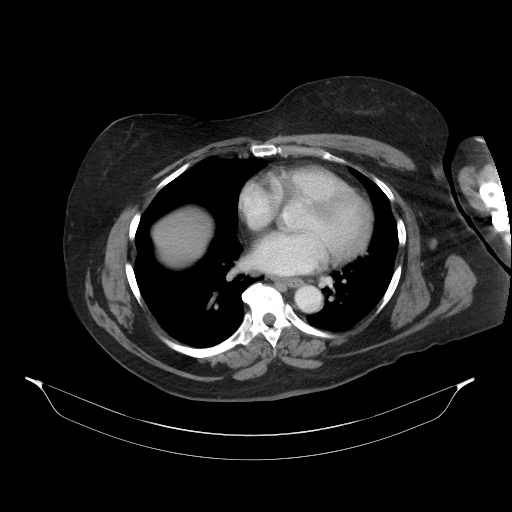
[im 91/98  lung]
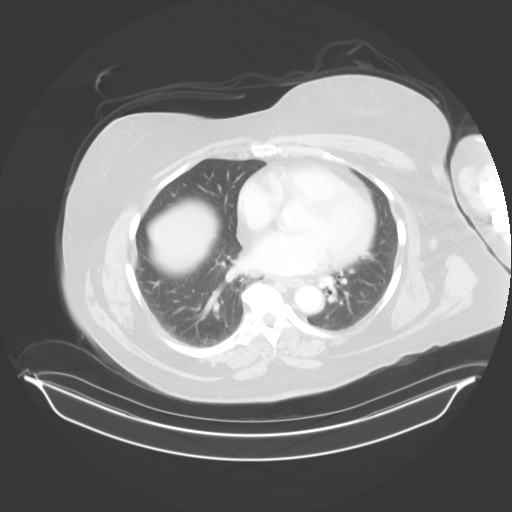

[13 of 32 positions shown; findings below may reference images not displayed]

FINDINGS: Lower chest: Lung bases are clear.

Hepatobiliary: Liver is within normal limits.

Gallbladder is unremarkable. No intrahepatic or extrahepatic ductal
dilatation.

Pancreas: Within normal limits.

Spleen: Within normal limits.

Adrenals/Urinary Tract: Adrenal glands are within normal limits.

Kidneys are within normal limits. No hydronephrosis.

Bladder is within normal limits.

Stomach/Bowel: Stomach is within normal limits.

No evidence of bowel obstruction.

Normal appendix (series 2/image 58).

Mild left colonic diverticulosis, without evidence of
diverticulitis.

Vascular/Lymphatic: No evidence of abdominal aortic aneurysm.

Mild atherosclerotic calcifications abdominal aorta.

No suspicious abdominopelvic lymphadenopathy.

Reproductive: Status post supracervical hysterectomy.

Left ovary is within normal limits. No right adnexal mass.

Other: No abdominopelvic ascites.

Postsurgical changes along the midline anterior abdominal wall.

No evidence of ventral or inguinal hernia.

Musculoskeletal: Degenerative changes of the visualized
thoracolumbar spine.
IMPRESSION: No evidence of bowel obstruction. Normal appendix.

No CT findings to account for the patient's right upper quadrant
abdominal pain.

Additional ancillary findings as above.

## 2020-03-04 ENCOUNTER — Ambulatory Visit
Admission: RE | Admit: 2020-03-04 | Discharge: 2020-03-04 | Disposition: A | Discharge status: Home / Self Care | Payer: Medicare Other | Source: Ambulatory Visit | Attending: Surgery | Admitting: Surgery

## 2020-03-04 DIAGNOSIS — K5651 Intestinal adhesions [bands], with partial obstruction: Secondary | ICD-10-CM

## 2020-03-04 MED ORDER — IOPAMIDOL (ISOVUE-300) INJECTION 61%
100.0000 mL | Freq: Once | INTRAVENOUS | Status: AC | PRN
  Administered 2020-03-04: 100 mL via INTRAVENOUS

## 2020-03-05 NOTE — Progress Notes (Signed)
Please call the patient and let them know that their CT Scan was normal.  No abnormalities in the stomach or intestines.  No sign of infection.

## 2020-04-05 ENCOUNTER — Encounter (HOSPITAL_COMMUNITY): Payer: Self-pay

## 2020-04-05 ENCOUNTER — Inpatient Hospital Stay (HOSPITAL_COMMUNITY): Payer: Medicare Other

## 2020-04-05 ENCOUNTER — Inpatient Hospital Stay (HOSPITAL_COMMUNITY)
Admission: EM | Admit: 2020-04-05 | Discharge: 2020-05-11 | DRG: 177 | Disposition: A | Discharge status: Home Health | Payer: Medicare Other | Attending: Internal Medicine | Admitting: Internal Medicine

## 2020-04-05 ENCOUNTER — Emergency Department (HOSPITAL_COMMUNITY): Payer: Medicare Other

## 2020-04-05 ENCOUNTER — Other Ambulatory Visit: Payer: Self-pay

## 2020-04-05 DIAGNOSIS — K429 Umbilical hernia without obstruction or gangrene: Secondary | ICD-10-CM | POA: Diagnosis present

## 2020-04-05 DIAGNOSIS — Y92239 Unspecified place in hospital as the place of occurrence of the external cause: Secondary | ICD-10-CM | POA: Diagnosis present

## 2020-04-05 DIAGNOSIS — Z833 Family history of diabetes mellitus: Secondary | ICD-10-CM | POA: Diagnosis not present

## 2020-04-05 DIAGNOSIS — R040 Epistaxis: Secondary | ICD-10-CM | POA: Diagnosis not present

## 2020-04-05 DIAGNOSIS — R748 Abnormal levels of other serum enzymes: Secondary | ICD-10-CM | POA: Diagnosis not present

## 2020-04-05 DIAGNOSIS — Z9989 Dependence on other enabling machines and devices: Secondary | ICD-10-CM

## 2020-04-05 DIAGNOSIS — Z885 Allergy status to narcotic agent status: Secondary | ICD-10-CM

## 2020-04-05 DIAGNOSIS — Z8249 Family history of ischemic heart disease and other diseases of the circulatory system: Secondary | ICD-10-CM | POA: Diagnosis not present

## 2020-04-05 DIAGNOSIS — S27309A Unspecified injury of lung, unspecified, initial encounter: Secondary | ICD-10-CM | POA: Diagnosis not present

## 2020-04-05 DIAGNOSIS — F17210 Nicotine dependence, cigarettes, uncomplicated: Secondary | ICD-10-CM | POA: Diagnosis present

## 2020-04-05 DIAGNOSIS — A0839 Other viral enteritis: Secondary | ICD-10-CM

## 2020-04-05 DIAGNOSIS — I10 Essential (primary) hypertension: Secondary | ICD-10-CM | POA: Diagnosis present

## 2020-04-05 DIAGNOSIS — F411 Generalized anxiety disorder: Secondary | ICD-10-CM | POA: Diagnosis present

## 2020-04-05 DIAGNOSIS — J4520 Mild intermittent asthma, uncomplicated: Secondary | ICD-10-CM | POA: Diagnosis present

## 2020-04-05 DIAGNOSIS — J9600 Acute respiratory failure, unspecified whether with hypoxia or hypercapnia: Secondary | ICD-10-CM

## 2020-04-05 DIAGNOSIS — E1165 Type 2 diabetes mellitus with hyperglycemia: Secondary | ICD-10-CM | POA: Diagnosis present

## 2020-04-05 DIAGNOSIS — N179 Acute kidney failure, unspecified: Secondary | ICD-10-CM | POA: Diagnosis present

## 2020-04-05 DIAGNOSIS — Z6841 Body Mass Index (BMI) 40.0 and over, adult: Secondary | ICD-10-CM

## 2020-04-05 DIAGNOSIS — Z91040 Latex allergy status: Secondary | ICD-10-CM

## 2020-04-05 DIAGNOSIS — Z7951 Long term (current) use of inhaled steroids: Secondary | ICD-10-CM

## 2020-04-05 DIAGNOSIS — J9601 Acute respiratory failure with hypoxia: Secondary | ICD-10-CM | POA: Diagnosis present

## 2020-04-05 DIAGNOSIS — Z880 Allergy status to penicillin: Secondary | ICD-10-CM

## 2020-04-05 DIAGNOSIS — X58XXXA Exposure to other specified factors, initial encounter: Secondary | ICD-10-CM | POA: Diagnosis present

## 2020-04-05 DIAGNOSIS — G4733 Obstructive sleep apnea (adult) (pediatric): Secondary | ICD-10-CM | POA: Diagnosis present

## 2020-04-05 DIAGNOSIS — Z9119 Patient's noncompliance with other medical treatment and regimen: Secondary | ICD-10-CM

## 2020-04-05 DIAGNOSIS — Z83438 Family history of other disorder of lipoprotein metabolism and other lipidemia: Secondary | ICD-10-CM

## 2020-04-05 DIAGNOSIS — Z79899 Other long term (current) drug therapy: Secondary | ICD-10-CM

## 2020-04-05 DIAGNOSIS — R0902 Hypoxemia: Secondary | ICD-10-CM

## 2020-04-05 DIAGNOSIS — U071 COVID-19: Principal | ICD-10-CM | POA: Diagnosis present

## 2020-04-05 DIAGNOSIS — R739 Hyperglycemia, unspecified: Secondary | ICD-10-CM | POA: Diagnosis not present

## 2020-04-05 DIAGNOSIS — R0600 Dyspnea, unspecified: Secondary | ICD-10-CM

## 2020-04-05 DIAGNOSIS — R630 Anorexia: Secondary | ICD-10-CM | POA: Diagnosis present

## 2020-04-05 DIAGNOSIS — Z8719 Personal history of other diseases of the digestive system: Secondary | ICD-10-CM

## 2020-04-05 DIAGNOSIS — K219 Gastro-esophageal reflux disease without esophagitis: Secondary | ICD-10-CM | POA: Diagnosis present

## 2020-04-05 DIAGNOSIS — J1282 Pneumonia due to coronavirus disease 2019: Secondary | ICD-10-CM | POA: Diagnosis present

## 2020-04-05 DIAGNOSIS — T380X5A Adverse effect of glucocorticoids and synthetic analogues, initial encounter: Secondary | ICD-10-CM | POA: Diagnosis not present

## 2020-04-05 LAB — ABO/RH: ABO/RH(D): A POS

## 2020-04-05 LAB — COMPREHENSIVE METABOLIC PANEL
ALT: 116 U/L — ABNORMAL HIGH (ref 0–44)
AST: 97 U/L — ABNORMAL HIGH (ref 15–41)
Albumin: 3.1 g/dL — ABNORMAL LOW (ref 3.5–5.0)
Alkaline Phosphatase: 52 U/L (ref 38–126)
Anion gap: 13 (ref 5–15)
BUN: 16 mg/dL (ref 8–23)
CO2: 23 mmol/L (ref 22–32)
Calcium: 8.5 mg/dL — ABNORMAL LOW (ref 8.9–10.3)
Chloride: 99 mmol/L (ref 98–111)
Creatinine, Ser: 1.21 mg/dL — ABNORMAL HIGH (ref 0.44–1.00)
GFR calc Af Amer: 54 mL/min — ABNORMAL LOW (ref >60)
GFR calc non Af Amer: 47 mL/min — ABNORMAL LOW (ref >60)
Glucose, Bld: 117 mg/dL — ABNORMAL HIGH (ref 70–99)
Potassium: 3.9 mmol/L (ref 3.5–5.1)
Sodium: 135 mmol/L (ref 135–145)
Total Bilirubin: 0.7 mg/dL (ref 0.3–1.2)
Total Protein: 6.8 g/dL (ref 6.5–8.1)

## 2020-04-05 LAB — LACTIC ACID, PLASMA: Lactic Acid, Venous: 1.7 mmol/L (ref 0.5–1.9)

## 2020-04-05 LAB — CBC WITH DIFFERENTIAL/PLATELET
Abs Immature Granulocytes: 0.06 10*3/uL (ref 0.00–0.07)
Basophils Absolute: 0 10*3/uL (ref 0.0–0.1)
Basophils Relative: 0 %
Eosinophils Absolute: 0 10*3/uL (ref 0.0–0.5)
Eosinophils Relative: 0 %
HCT: 40.3 % (ref 36.0–46.0)
Hemoglobin: 12.8 g/dL (ref 12.0–15.0)
Immature Granulocytes: 1 %
Lymphocytes Relative: 9 %
Lymphs Abs: 0.6 10*3/uL — ABNORMAL LOW (ref 0.7–4.0)
MCH: 27.5 pg (ref 26.0–34.0)
MCHC: 31.8 g/dL (ref 30.0–36.0)
MCV: 86.5 fL (ref 80.0–100.0)
Monocytes Absolute: 0.4 10*3/uL (ref 0.1–1.0)
Monocytes Relative: 6 %
Neutro Abs: 6.2 10*3/uL (ref 1.7–7.7)
Neutrophils Relative %: 84 %
Platelets: 196 10*3/uL (ref 150–400)
RBC: 4.66 MIL/uL (ref 3.87–5.11)
RDW: 14.4 % (ref 11.5–15.5)
WBC: 7.3 10*3/uL (ref 4.0–10.5)
nRBC: 0 % (ref 0.0–0.2)

## 2020-04-05 LAB — BLOOD GAS, VENOUS
Acid-Base Excess: 1.3 mmol/L (ref 0.0–2.0)
Bicarbonate: 26.4 mmol/L (ref 20.0–28.0)
O2 Saturation: 38.5 %
Patient temperature: 98.6
pCO2, Ven: 46 mmHg (ref 44.0–60.0)
pH, Ven: 7.377 (ref 7.250–7.430)
pO2, Ven: 27.3 mmHg — CL (ref 32.0–45.0)

## 2020-04-05 LAB — HIV ANTIBODY (ROUTINE TESTING W REFLEX): HIV Screen 4th Generation wRfx: NONREACTIVE

## 2020-04-05 LAB — C-REACTIVE PROTEIN: CRP: 16.6 mg/dL — ABNORMAL HIGH (ref <1.0)

## 2020-04-05 LAB — FERRITIN: Ferritin: 852 ng/mL — ABNORMAL HIGH (ref 11–307)

## 2020-04-05 LAB — LACTATE DEHYDROGENASE: LDH: 632 U/L — ABNORMAL HIGH (ref 98–192)

## 2020-04-05 LAB — TRIGLYCERIDES: Triglycerides: 148 mg/dL (ref <150)

## 2020-04-05 LAB — FIBRINOGEN: Fibrinogen: 711 mg/dL — ABNORMAL HIGH (ref 210–475)

## 2020-04-05 LAB — PROCALCITONIN: Procalcitonin: 0.28 ng/mL

## 2020-04-05 LAB — SARS CORONAVIRUS 2 BY RT PCR (HOSPITAL ORDER, PERFORMED IN ~~LOC~~ HOSPITAL LAB): SARS Coronavirus 2: POSITIVE — AB

## 2020-04-05 LAB — D-DIMER, QUANTITATIVE: D-Dimer, Quant: 4.17 ug/mL-FEU — ABNORMAL HIGH (ref 0.00–0.50)

## 2020-04-05 MED ORDER — ENOXAPARIN SODIUM 100 MG/ML ~~LOC~~ SOLN
100.0000 mg | Freq: Two times a day (BID) | SUBCUTANEOUS | Status: DC
  Administered 2020-04-06: 100 mg via SUBCUTANEOUS
  Filled 2020-04-05: qty 1

## 2020-04-05 MED ORDER — OXYCODONE HCL 5 MG PO TABS
5.0000 mg | ORAL_TABLET | ORAL | Status: DC | PRN
  Filled 2020-04-05: qty 1

## 2020-04-05 MED ORDER — MAGNESIUM CITRATE PO SOLN: 1.0000 | Freq: Once | ORAL | Status: DC | PRN

## 2020-04-05 MED ORDER — ACETAMINOPHEN 500 MG PO TABS
1000.0000 mg | ORAL_TABLET | Freq: Once | ORAL | Status: AC
  Administered 2020-04-05: 1000 mg via ORAL
  Filled 2020-04-05: qty 2

## 2020-04-05 MED ORDER — ACETAMINOPHEN 325 MG PO TABS
650.0000 mg | ORAL_TABLET | Freq: Four times a day (QID) | ORAL | Status: DC | PRN
  Administered 2020-04-06 – 2020-04-21 (×3): 650 mg via ORAL
  Filled 2020-04-05 (×5): qty 2

## 2020-04-05 MED ORDER — SODIUM CHLORIDE 0.9 % IV SOLN
100.0000 mg | Freq: Every day | INTRAVENOUS | Status: AC
  Administered 2020-04-06 – 2020-04-09 (×4): 100 mg via INTRAVENOUS
  Filled 2020-04-05 (×4): qty 20

## 2020-04-05 MED ORDER — IOHEXOL 350 MG/ML SOLN
80.0000 mL | Freq: Once | INTRAVENOUS | Status: AC | PRN
  Administered 2020-04-05: 80 mL via INTRAVENOUS

## 2020-04-05 MED ORDER — ENOXAPARIN SODIUM 40 MG/0.4ML ~~LOC~~ SOLN
40.0000 mg | SUBCUTANEOUS | Status: DC
  Administered 2020-04-05: 40 mg via SUBCUTANEOUS
  Filled 2020-04-05: qty 0.4

## 2020-04-05 MED ORDER — ASCORBIC ACID 500 MG PO TABS
500.0000 mg | ORAL_TABLET | Freq: Every day | ORAL | Status: DC
  Administered 2020-04-05 – 2020-05-11 (×37): 500 mg via ORAL
  Filled 2020-04-05 (×37): qty 1

## 2020-04-05 MED ORDER — SODIUM CHLORIDE 0.9 % IV SOLN: INTRAVENOUS | Status: AC

## 2020-04-05 MED ORDER — HYDROCOD POLST-CPM POLST ER 10-8 MG/5ML PO SUER
5.0000 mL | Freq: Two times a day (BID) | ORAL | Status: DC | PRN
  Administered 2020-04-05 – 2020-05-02 (×18): 5 mL via ORAL
  Filled 2020-04-05 (×19): qty 5

## 2020-04-05 MED ORDER — BARICITINIB 2 MG PO TABS
2.0000 mg | ORAL_TABLET | Freq: Every day | ORAL | Status: DC
  Administered 2020-04-05 – 2020-04-06 (×2): 2 mg via ORAL
  Filled 2020-04-05 (×2): qty 1

## 2020-04-05 MED ORDER — BISACODYL 5 MG PO TBEC
5.0000 mg | DELAYED_RELEASE_TABLET | Freq: Every day | ORAL | Status: DC | PRN
  Administered 2020-04-13 – 2020-05-05 (×4): 5 mg via ORAL
  Filled 2020-04-05 (×4): qty 1

## 2020-04-05 MED ORDER — IPRATROPIUM-ALBUTEROL 20-100 MCG/ACT IN AERS
1.0000 | INHALATION_SPRAY | Freq: Four times a day (QID) | RESPIRATORY_TRACT | Status: DC
  Administered 2020-04-05 – 2020-04-25 (×78): 1 via RESPIRATORY_TRACT
  Filled 2020-04-05 (×2): qty 4

## 2020-04-05 MED ORDER — ONDANSETRON HCL 4 MG PO TABS
4.0000 mg | ORAL_TABLET | Freq: Four times a day (QID) | ORAL | Status: DC | PRN
  Administered 2020-04-30: 4 mg via ORAL
  Filled 2020-04-05: qty 1

## 2020-04-05 MED ORDER — PANTOPRAZOLE SODIUM 40 MG PO TBEC
40.0000 mg | DELAYED_RELEASE_TABLET | Freq: Every day | ORAL | Status: DC
  Administered 2020-04-05 – 2020-05-11 (×37): 40 mg via ORAL
  Filled 2020-04-05 (×37): qty 1

## 2020-04-05 MED ORDER — ZOLPIDEM TARTRATE 5 MG PO TABS
5.0000 mg | ORAL_TABLET | Freq: Every evening | ORAL | Status: DC | PRN
  Administered 2020-04-07 – 2020-05-10 (×29): 5 mg via ORAL
  Filled 2020-04-05 (×29): qty 1

## 2020-04-05 MED ORDER — DEXAMETHASONE SODIUM PHOSPHATE 10 MG/ML IJ SOLN
10.0000 mg | Freq: Once | INTRAMUSCULAR | Status: AC
  Administered 2020-04-05: 10 mg via INTRAVENOUS
  Filled 2020-04-05: qty 1

## 2020-04-05 MED ORDER — POLYETHYLENE GLYCOL 3350 17 G PO PACK: 17.0000 g | PACK | Freq: Every day | ORAL | Status: DC | PRN

## 2020-04-05 MED ORDER — ENOXAPARIN SODIUM 60 MG/0.6ML ~~LOC~~ SOLN
60.0000 mg | Freq: Once | SUBCUTANEOUS | Status: AC
  Administered 2020-04-05: 60 mg via SUBCUTANEOUS
  Filled 2020-04-05: qty 0.6

## 2020-04-05 MED ORDER — SODIUM CHLORIDE (PF) 0.9 % IJ SOLN
INTRAMUSCULAR | Status: AC
  Filled 2020-04-05: qty 50

## 2020-04-05 MED ORDER — SODIUM CHLORIDE 0.9 % IV SOLN
200.0000 mg | Freq: Once | INTRAVENOUS | Status: AC
  Administered 2020-04-05: 200 mg via INTRAVENOUS
  Filled 2020-04-05: qty 200

## 2020-04-05 MED ORDER — ONDANSETRON HCL 4 MG/2ML IJ SOLN: 4.0000 mg | Freq: Four times a day (QID) | INTRAMUSCULAR | Status: DC | PRN

## 2020-04-05 MED ORDER — ZINC SULFATE 220 (50 ZN) MG PO CAPS
220.0000 mg | ORAL_CAPSULE | Freq: Every day | ORAL | Status: DC
  Administered 2020-04-05 – 2020-05-10 (×36): 220 mg via ORAL
  Filled 2020-04-05 (×36): qty 1

## 2020-04-05 MED ORDER — METHYLPREDNISOLONE SODIUM SUCC 125 MG IJ SOLR
0.5000 mg/kg | Freq: Two times a day (BID) | INTRAMUSCULAR | Status: DC
  Administered 2020-04-06: 51.875 mg via INTRAVENOUS
  Filled 2020-04-05: qty 2

## 2020-04-05 MED ORDER — GUAIFENESIN-DM 100-10 MG/5ML PO SYRP
10.0000 mL | ORAL_SOLUTION | ORAL | Status: DC | PRN
  Administered 2020-04-06 – 2020-04-30 (×10): 10 mL via ORAL
  Filled 2020-04-05 (×11): qty 10

## 2020-04-05 NOTE — ED Notes (Signed)
Holding off remdesivir until CT can is complete.

## 2020-04-05 NOTE — ED Triage Notes (Signed)
Pt arrived via EMS, from home COVID (+) since sat, worsening sob today. Per daughter, spo2 at home 50%, EMS states 70's RA on their arrival. Pt 78% on NRB _0 . Denies any CP, n/v or diarrhea, fevers (well controlled) and chills at home.   HR 146  BP 120/72 spo2 90's NRB T 98.9

## 2020-04-05 NOTE — Progress Notes (Signed)
ANTICOAGULATION CONSULT NOTE - Initial Consult  Pharmacy Consult for Lovenox Indication: empiric tx for VTE in setting of COVID PNA  Allergies  Allergen Reactions  . Latex Hives and Itching  . Penicillins Hives, Itching and Other (See Comments)    Has patient had a PCN reaction causing immediate rash, facial/tongue/throat swelling, SOB or lightheadedness with hypotension: Yes Has patient had a PCN reaction causing severe rash involving mucus membranes or skin necrosis: Unk Has patient had a PCN reaction that required hospitalization: Unk Has patient had a PCN reaction occurring within the last 10 years: No If all of the above answers are "NO", then may proceed with Cephalosporin use.   . Tape Hives, Itching and Other (See Comments)    Coban wrap, in particular  . Codeine Itching    Patient Measurements: Height: _0  (160 cm) Weight: 104.3 kg (230 lb) IBW/kg (Calculated) : 52.4  Vital Signs: Temp: 100.1 F (37.8 C) (08/23 1745) Temp Source: Oral (08/23 1745) BP: 125/78 (08/23 1745) Pulse Rate: 95 (08/23 1745)  Labs: Recent Labs    04/05/20 1443  HGB 12.8  HCT 40.3  PLT 196  CREATININE 1.21*    Estimated Creatinine Clearance: 52.8 mL/min (A) (by C-G formula based on SCr of 1.21 mg/dL (H)).   Medical History: Past Medical History:  Diagnosis Date  . Anemia yrs ago  . Arthritis    knees  . Bronchitis    hx  . Complication of anesthesia    trouble breathing when wake up needs breathing tx when wakes up  . Hx of colonic polyps 2015  . Obstructive sleep apnea 06/02/2016   No CPAP used  . Recurrent ventral hernia 09/08/2013  . Umbilical hernia     Medications:  Scheduled:  . vitamin C  500 mg Oral Daily  . baricitinib  2 mg Oral Daily  . enoxaparin (LOVENOX) injection  40 mg Subcutaneous Q24H  . Ipratropium-Albuterol  1 puff Inhalation Q6H  . [START ON 04/06/2020] methylPREDNISolone (SOLU-MEDROL) injection  0.5 mg/kg Intravenous Q12H  . pantoprazole  40 mg  Oral Daily  . sodium chloride (PF)      . zinc sulfate  220 mg Oral Daily   Infusions:  . sodium chloride    . remdesivir 200 mg in sodium chloride 0.9% 250 mL IVPB     Followed by  . [START ON 04/06/2020] remdesivir 100 mg in NS 100 mL      Assessment: 64 yo female with COVID PNA to start empiric treatment dose Lovenox per Md orders for possible VTE. Follow up CT and doppler results.   Goal of Therapy:  Heparin level 0.6-1.1 units/ml Monitor platelets by anticoagulation protocol: Yes   Plan:  Note that Lovenox 2m already given x 1 in ED at 1750 Give additional 683mLovenox to = 10029m1mg37m) then  Start Lovenox [id number] pending results for CT/dopplers to rule out or in acute VTE  LeggeKara Mead/2021,6:13 PM

## 2020-04-05 NOTE — ED Notes (Signed)
Patient transported to CT 

## 2020-04-05 NOTE — ED Provider Notes (Signed)
Care assumed at shift change from [place], [place], pending CT abd/pelvis and re-evaluation. See his note for full HPI and workup. Briefly, pt is COVID positive, on day 10 of symptoms with worsening SOB over the last 4 days. PCP initially treated with z-pak. Also endorses sore throat. Denies CP. Hypoxic per [facility] 50% sat on arrival. 78% on arrival to ED, tachypneic, normal work of breathing, diffuse rales. Febrile and tachycardic, normotensive. Satting well now on nonrebreather plus high flow O2 . Needs admission pending metabolic panel and orders for remdesivir.  Physical Exam  BP 133/80   Pulse (!) 110   Temp (!) 103.9 F (39.9 C) (Rectal)   Resp (!) 31   Ht _0  (1.6 m)   Wt 104.3 kg   SpO2 94%   BMI 40.74 kg/m   Physical Exam Cardiovascular:     Rate and Rhythm: Tachycardia present.  Pulmonary:     Effort: Pulmonary effort is normal. Tachypnea present.     Comments: O2 sat 94%, normal work of breathing, patient talking on the phone Musculoskeletal:     Cervical back: Normal range of motion.  Neurological:     Mental Status: She is alert.    Results for orders placed or performed during the hospital encounter of 04/05/20  SARS Coronavirus 2 by RT PCR (hospital order, performed in [place] hospital lab) Nasopharyngeal Nasopharyngeal Swab   Specimen: Nasopharyngeal Swab  Result Value Ref Range   SARS Coronavirus 2 POSITIVE (A) NEGATIVE  CBC WITH DIFFERENTIAL  Result Value Ref Range   WBC 7.3 4.0 - 10.5 K/uL   RBC 4.66 3.87 - 5.11 MIL/uL   Hemoglobin 12.8 12.0 - 15.0 g/dL   HCT 40.3 36 - 46 %   MCV 86.5 80.0 - 100.0 fL   MCH 27.5 26.0 - 34.0 pg   MCHC 31.8 30.0 - 36.0 g/dL   RDW 14.4 11.5 - 15.5 %   Platelets 196 150 - 400 K/uL   nRBC 0.0 0.0 - 0.2 %   Neutrophils Relative % 84 %   Neutro Abs 6.2 1.7 - 7.7 K/uL   Lymphocytes Relative 9 %   Lymphs Abs 0.6 (L) 0.7 - 4.0 K/uL   Monocytes Relative 6 %   Monocytes Absolute 0.4 0 - 1 K/uL   Eosinophils Relative 0 %    Eosinophils Absolute 0.0 0 - 0 K/uL   Basophils Relative 0 %   Basophils Absolute 0.0 0 - 0 K/uL   Immature Granulocytes 1 %   Abs Immature Granulocytes 0.06 0.00 - 0.07 K/uL   Reactive, Benign Lymphocytes PRESENT   Comprehensive metabolic panel  Result Value Ref Range   Sodium 135 135 - 145 mmol/L   Potassium 3.9 3.5 - 5.1 mmol/L   Chloride 99 98 - 111 mmol/L   CO2 23 22 - 32 mmol/L   Glucose, Bld 117 (H) 70 - 99 mg/dL   BUN 16 8 - 23 mg/dL   Creatinine, Ser 1.21 (H) 0.44 - 1.00 mg/dL   Calcium 8.5 (L) 8.9 - 10.3 mg/dL   Total Protein 6.8 6.5 - 8.1 g/dL   Albumin 3.1 (L) 3.5 - 5.0 g/dL   AST 97 (H) 15 - 41 U/L   ALT 116 (H) 0 - 44 U/L   Alkaline Phosphatase 52 38 - 126 U/L   Total Bilirubin 0.7 0.3 - 1.2 mg/dL   GFR calc non Af Amer 47 (L) >60 mL/min   GFR calc Af Amer 54 (L) >60 mL/min  Anion gap 13 5 - 15  D-dimer, quantitative  Result Value Ref Range   D-Dimer, Quant 4.17 (H) 0.00 - 0.50 ug/mL-FEU  Procalcitonin  Result Value Ref Range   Procalcitonin 0.28 ng/mL  Lactate dehydrogenase  Result Value Ref Range   LDH 632 (H) 98 - 192 U/L  Ferritin  Result Value Ref Range   Ferritin 852 (H) 11 - 307 ng/mL  Triglycerides  Result Value Ref Range   Triglycerides 148 <150 mg/dL  Fibrinogen  Result Value Ref Range   Fibrinogen 711 (H) 210 - 475 mg/dL  C-reactive protein  Result Value Ref Range   CRP 16.6 (H) <1.0 mg/dL  Blood gas, venous (at [facility] and [facility], not at [facility])  Result Value Ref Range   pH, Ven 7.377 7.25 - 7.43   pCO2, Ven 46.0 44 - 60 mmHg   pO2, Ven 27.3 (LL) 32 - 45 mmHg   Bicarbonate 26.4 20.0 - 28.0 mmol/L   Acid-Base Excess 1.3 0.0 - 2.0 mmol/L   O2 Saturation 38.5 %   Patient temperature 98.6    DG Chest Port 1 View  Result Date: 04/05/2020 CLINICAL DATA:  Shortness of breath and hypoxia, COVID-19 positivity EXAM: PORTABLE CHEST 1 VIEW COMPARISON:  10/12/2016 FINDINGS: Cardiac shadow is mildly prominent but accentuated by the portable technique.  Diffuse bilateral patchy airspace opacities are noted consistent with the given clinical history. No sizable effusion is seen. No bony abnormality is noted. IMPRESSION: Changes consistent with COVID-19 pneumonia. Electronically Signed   By: Inez Catalina M.D.   On: 04/05/2020 15:30     ED Course/Procedures   Clinical Course as of Apr 05 1641  Mon Apr 05, 2020  1621 Consulted with critical care. Recommends patient to be placed on bipap and admitted to hospitalist to step down.   [JR]  [place] reporting pt unable to be placed on bipap while in the ED due to lack of supplies. Will discuss with hospitalist.   [JR]    Clinical Course User Index [JR] Brielyn Bosak, [place] N, PA-C    Procedures  MDM  Patient admitted to hospitalist service to step down.       Mattia Liford, [place] N, PA-C 04/05/20 1705    Quintella Reichert, MD 04/07/20 1544

## 2020-04-05 NOTE — H&P (Signed)
History and Physical    Tiffany Coleman JQZ:[id number] DOB: 09-28-1955 DOA: 04/05/2020  PCP: Nolene Ebbs, MD Patient coming from: Home.  Chief Complaint: low oxygen  HPI: Tiffany Coleman is a 64 y.o. female with history of OSA on CPAP, morbid obesity, GAD and GERD with hypoxia and difficulty breathing.  Patient is somewhat confused but tells me her mind was not right this morning.  She says she was with COVID-19 on Friday but thinks today is Saturday.  She reports difficulty breathing, cough, body aches, nausea, vomiting and diarrhea.  Per patient's daughter over the phone, patient tested positive for COVID-19 at PCP office 8 days ago.  She was started on prednisone and azithromycin.  Per daughter, she had loss of taste, smell, poor appetite, cough, shortness of breath, nausea, vomiting and diarrhea.  Last night, she checked her oxygen and it was down to 67%. However, she she thought that was her heart rate refused to come to the hospital.  About noon this afternoon, patient was gasping for air by family member so called EMS.  Per EMS report, patient desaturated in 33s on room air.  She only improved to 78% on NRB at 15 L.  Patient is unvaccinated against COVID-19.  Per daughter, intermittently smokes cigarettes here and there.  Denies alcohol or recreational drug use.  In the ED, febrile to 104.  HR 118> 96.  RR 38> 26.  BP within normal.  78% on NRB at 15 L but improved to mid 90s with HFNC/NRB at 15 L.  ABG significant for PO2 of 27. Cr 1.21.  LFT slightly elevated.  CRP 16.6.  Other inflammatory markers elevated.  Pro-Cal 0.28.  Lactic acid 1.7.  CXR with bilateral infiltrate consistent with Covid.  Twelve-lead EKG with sinus tachycardia and occasional PACs.  Blood cultures obtained.  Patient was started on Decadron and remdesivir.  PCCM consulted by EDP recommended admission to medicine and start BiPAP once in a unit.  CTA chest ordered and pending.   ROS All review of  system negative except for pertinent positives and negatives as history of present illness above.  PMH Past Medical History:  Diagnosis Date  . Anemia yrs ago  . Arthritis    knees  . Bronchitis    hx  . Complication of anesthesia    trouble breathing when wake up needs breathing tx when wakes up  . Hx of colonic polyps 2015  . Obstructive sleep apnea 06/02/2016   No CPAP used  . Recurrent ventral hernia 09/08/2013  . Umbilical hernia    PSH Past Surgical History:  Procedure Laterality Date  . ABDOMINAL HYSTERECTOMY     partial  . BREAST SURGERY Bilateral    cyst removed  . COLON SURGERY    . COLONOSCOPY WITH PROPOFOL N/A 05/29/2017   Procedure: COLONOSCOPY WITH PROPOFOL;  Surgeon: Wilford Corner, MD;  Location: [facility] END[facility]COPY;  Service: Endoscopy;  Laterality: N/A;  . FINGER SURGERY     right pinkie x 3  . HERNIA REPAIR    . KNEE SURGERY Right 2017  . PARTIAL COLECTOMY Left ~2004   for obstruction  . right foot surgery  yrs ago  . stomach tumur     [id number] benign   Fam HX Family History  Problem Relation Age of Onset  . Deep vein thrombosis Mother   . Diabetes Mother   . Hypertension Mother   . Varicose Veins Mother   . Cancer Father  colon  . Hypertension Sister   . Hyperlipidemia Brother   . Hypertension Daughter   . Heart attack Son   . Cancer Maternal Uncle     Social Hx  reports that she has been smoking cigarettes. She has smoked for the past 20.00 years. She has never used smokeless tobacco. She reports current alcohol use. She reports that she does not use drugs.  Allergy Allergies  Allergen Reactions  . Latex Hives and Itching  . Penicillins Hives, Itching and Other (See Comments)    Has patient had a PCN reaction causing immediate rash, facial/tongue/throat swelling, SOB or lightheadedness with hypotension: Yes Has patient had a PCN reaction causing severe rash involving mucus membranes or skin necrosis: Unk Has patient had a PCN  reaction that required hospitalization: Unk Has patient had a PCN reaction occurring within the last 10 years: No If all of the above answers are "NO", then may proceed with Cephalosporin use.   . Tape Hives, Itching and Other (See Comments)    Coban wrap, in particular  . Codeine Itching   Home Meds Prior to Admission medications   Medication Sig Start Date End Date Taking? Authorizing Provider  acetaminophen (TYLENOL) 325 MG tablet Take 975 mg by mouth every 6 (six) hours as needed for headache (pain).   Yes [provider]  albuterol (PROVENTIL HFA;VENTOLIN HFA) 108 (90 Base) MCG/ACT inhaler Inhale 2 puffs into the lungs every 6 (six) hours as needed for wheezing or shortness of breath. 01/19/17  Yes Minus Liberty, MD  albuterol (PROVENTIL) (2.5 MG/3ML) 0.083% nebulizer solution Take 3 mLs (2.5 mg total) by nebulization every 6 (six) hours as needed for wheezing or shortness of breath. 02/08/19  Yes Yu, Amy V, PA-C  budesonide-formoterol (SYMBICORT) 160-4.5 MCG/ACT inhaler Inhale 2 puffs into the lungs 2 (two) times daily.   Yes [provider]  gabapentin (NEURONTIN) 300 MG capsule Take 300 mg by mouth 3 (three) times daily as needed (nerve pain).  07/17/19  Yes [provider]  zolpidem (AMBIEN) 10 MG tablet Take 10 mg by mouth at bedtime as needed for sleep.  07/18/19  Yes [provider]  Cetirizine HCl (ZYRTEC ALLERGY) 10 MG CAPS Take 1 capsule (10 mg total) by mouth daily as needed. Patient not taking: Reported on 07/12/2017 01/19/17   Minus Liberty, MD  diphenhydrAMINE (BENADRYL) 25 MG tablet Take 1 tablet (25 mg total) by mouth every 6 (six) hours as needed. Patient not taking: Reported on 07/12/2017 03/06/17   Ledell Noss, MD  fluticasone [facility]) 50 MCG/ACT nasal spray USE 1 SPRAY INTO BOTH NOSTRILS EVERY DAY Patient not taking: Reported on 07/12/2017 01/21/17   Ledell Noss, MD  ipratropium (ATROVENT) 0.06 % nasal spray Place 2 sprays into  both nostrils 4 (four) times daily. Patient not taking: Reported on 04/25/2019 02/08/19   Ok Edwards, PA-C  omeprazole (PRILOSEC) 20 MG capsule Take 1 capsule (20 mg total) by mouth daily. Patient not taking: Reported on 07/12/2017 05/29/17   Ledell Noss, MD    Physical Exam: Vitals:   04/05/20 1419 04/05/20 1445 04/05/20 1451 04/05/20 1500  BP:  121/80 121/80 133/80  Pulse:  (!) 116  (!) 110  Resp:  (!) 25  (!) 31  Temp:   (!) 103.9 F (39.9 C)   TempSrc:   Rectal   SpO2:  93% 100% 94%  Weight: 104.3 kg     Height: _0  (1.6 m)       GENERAL: No acute  distress.  Appears well.  HEENT: MMM.  Vision and hearing grossly intact.  NECK: Supple.  No apparent JVD.  RESP: 93% on HFNC/NRB no IWOB.  Rhonchi bilateral crackles. CVS: HR 90s.  Regular rhythm.. Heart sounds normal.  ABD/GI/GU: Bowel sounds present. Soft. Non tender.  MSK/EXT:  Moves extremities. No apparent deformity or edema.  SKIN: no apparent skin lesion or wound NEURO: Awake.  Oriented to self, place, month and the year but not date/day.  No gross deficit.  PSYCH: Calm. Normal affect.    Personally Reviewed Radiological Exams DG Chest Port 1 View  Result Date: 04/05/2020 CLINICAL DATA:  Shortness of breath and hypoxia, COVID-19 positivity EXAM: PORTABLE CHEST 1 VIEW COMPARISON:  10/12/2016 FINDINGS: Cardiac shadow is mildly prominent but accentuated by the portable technique. Diffuse bilateral patchy airspace opacities are noted consistent with the given clinical history. No sizable effusion is seen. No bony abnormality is noted. IMPRESSION: Changes consistent with COVID-19 pneumonia. Electronically Signed   By: Inez Catalina M.D.   On: 04/05/2020 15:30     Personally Reviewed Labs: CBC: Recent Labs  Lab 04/05/20 1443  WBC 7.3  NEUTROABS 6.2  HGB 12.8  HCT 40.3  MCV 86.5  PLT 517   Basic Metabolic Panel: Recent Labs  Lab 04/05/20 1443  NA 135  K 3.9  CL 99  CO2 23  GLUCOSE 117*  BUN 16  CREATININE 1.21*   CALCIUM 8.5*   GFR: Estimated Creatinine Clearance: 52.8 mL/min (A) (by C-G formula based on SCr of 1.21 mg/dL (H)). Liver Function Tests: Recent Labs  Lab 04/05/20 1443  AST 97*  ALT 116*  ALKPHOS 52  BILITOT 0.7  PROT 6.8  ALBUMIN 3.1*   No results for input(s): LIPASE, AMYLASE in the last 168 hours. No results for input(s): AMMONIA in the last 168 hours. Coagulation Profile: No results for input(s): INR, PROTIME in the last 168 hours. Cardiac Enzymes: No results for input(s): CKTOTAL, CKMB, CKMBINDEX, TROPONINI in the last 168 hours. BNP (last 3 results) No results for input(s): PROBNP in the last 8760 hours. HbA1C: No results for input(s): HGBA1C in the last 72 hours. CBG: No results for input(s): GLUCAP in the last 168 hours. Lipid Profile: Recent Labs    04/05/20 1443  TRIG 148   Thyroid Function Tests: No results for input(s): TSH, T4TOTAL, FREET4, T3FREE, THYROIDAB in the last 72 hours. Anemia Panel: Recent Labs    04/05/20 1443  FERRITIN 852*   Urine analysis:    Component Value Date/Time   COLORURINE YELLOW 07/25/2019 [place] 07/25/2019 1343   APPEARANCEUR Cloudy (A) 04/25/2019 1132   LABSPEC 1.018 07/25/2019 1343   PHURINE 5.0 07/25/2019 1343   GLUCOSEU NEGATIVE 07/25/2019 1343   HGBUR NEGATIVE 07/25/2019 1343   BILIRUBINUR NEGATIVE 07/25/2019 1343   BILIRUBINUR Negative 04/25/2019 [place] 07/25/2019 1343   PROTEINUR NEGATIVE 07/25/2019 1343   NITRITE NEGATIVE 07/25/2019 1343   LEUKOCYTESUR NEGATIVE 07/25/2019 1343    Sepsis Labs:  Lactic acid 1.7. Procalcitonin 0.26.  Personally Reviewed EKG:  12-lead EKG with sinus tachycardia and occasional PACs.  Assessment/Plan Active Problems:   Acute hypoxemic respiratory failure due to COVID-19 [facility]) Pneumonia due to COVID-19 Gastroenteritis due to COVID-19 Symptomatic for 8 days.  Progressive symptoms.  Desaturated to 70s on room air requiring HFNC/NRB at  15 L to maintain saturation in mid 90s.  Inflammatory markers elevated.  CXR with bilateral infiltrate consistent with COVID-19. Recent Labs  04/05/20 1443  DDIMER 4.17*  FERRITIN 852*  LDH 632*  CRP 16.6*  -IV Solu-Medrol, remdesivir and baricitinib.   -Discussed risk and benefits and off label use of baricitinib with patient's daughter over the phone.  -Supportive care with inhalers, mucolytic's, antitussives  -Full dose Lovenox empirically pending CTA chest. -Encourage proning if able. -Gentle IV fluid at 50 cc an hour for the next 24 hours.  Elevated liver enzymes: Likely due to the above. -Continue monitoring  OSA on CPAP -Supplemental oxygen as above.  History of asthma? -Steroid inhalers as above.  Morbid obesity Body mass index is 40.74 kg/m.     DVT prophylaxis: Full dose Lovenox  Code Status: Full code Family Communication: Updated patient's daughter over the phone.  Disposition Plan: Admit to stepdown unit Consults called: PCCM by EDP Admission status: Inpatient.  Critically ill patient with significant respiratory failure   Mercy Riding MD [facility]  If 7PM-7AM, please contact night-coverage [email or web address]  04/05/2020, 5:33 PM

## 2020-04-05 NOTE — ED Provider Notes (Signed)
[facility] DEPT Provider Note   CSN: [id number] Arrival date & time: 04/05/20  1403     History Chief Complaint  Patient presents with  . Shortness of Breath    Tiffany Coleman is a 64 y.o. female.  The history is provided by the patient and the EMS personnel. No language interpreter was used.  Shortness of Breath Severity:  Severe Onset quality:  Gradual Duration:  4 days Timing:  Constant Progression:  Worsening Chronicity:  New Context: activity and URI   Context comment:  Now also at rest Relieved by:  Nothing Worsened by:  Activity and coughing Associated symptoms: cough, fever and sore throat   Associated symptoms: no abdominal pain, no chest pain, no headaches, no rash and no vomiting   Cough:    Cough characteristics:  Productive (scant sputum) Risk factors: obesity   Risk factors: no recent alcohol use, no family hx of DVT, no hx of cancer, no hx of PE/DVT, no oral contraceptive use, no prolonged immobilization, no recent surgery and no tobacco use     Patient tested + for covid 19 on Saturday. Has had some cough and fatigue for 10 days and was initially treated by PCP/UC with azithromycin and "some cough meidcine" but over the past 4-5 days has felt significantly more SOB and now SOB at rest. Tested + for covid on Saturday and has not received the vaccine. She has pert PMH of OSA, HTN, HTN, GAD.  She denies any chest pain specifically no pleuritic pain. Obesity is only risk factor for PE.       Past Medical History:  Diagnosis Date  . Anemia yrs ago  . Arthritis    knees  . Bronchitis    hx  . Complication of anesthesia    trouble breathing when wake up needs breathing tx when wakes up  . Hx of colonic polyps 2015  . Obstructive sleep apnea 06/02/2016   No CPAP used  . Recurrent ventral hernia 09/08/2013  . Umbilical hernia     Patient Active Problem List   Diagnosis Date Noted  . Acute hypoxemic respiratory  failure due to COVID-19 ([place]) 04/05/2020  . Elevated BP without diagnosis of hypertension 04/25/2019  . 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 Teeth missing 05/30/2017  . GERD (gastroesophageal reflux disease) 05/30/2017  . Hypertension 03/08/2017  . Epigastric pain 03/06/2017  . Seasonal allergies 01/19/2017  . Morbid obesity ([place]) 01/19/2017  . Generalized anxiety disorder 10/05/2016  . History of colonic polyps 06/03/2016  . Obstructive sleep apnea 06/02/2016  . Recurrent ventral hernia 09/08/2013    Past Surgical History:  Procedure Laterality Date  . ABDOMINAL HYSTERECTOMY     partial  . BREAST SURGERY Bilateral    cyst removed  . COLON SURGERY    . COLONOSCOPY WITH PROPOFOL N/A 05/29/2017   Procedure: COLONOSCOPY WITH PROPOFOL;  Surgeon: Wilford Corner, MD;  Location: [facility];  Service: Endoscopy;  Laterality: N/A;  . FINGER SURGERY     right pinkie x 3  . HERNIA REPAIR    . KNEE SURGERY Right 2017  . PARTIAL COLECTOMY Left ~2004   for obstruction  . right foot surgery  yrs ago  . stomach tumur     [id number] benign     OB History    Gravida  7   Para      Term      Preterm      AB  3   Living  3  SAB  2   TAB  1   Ectopic      Multiple      Live Births  4           Family History  Problem Relation Age of Onset  . Deep vein thrombosis Mother   . Diabetes Mother   . Hypertension Mother   . Varicose Veins Mother   . Cancer Father        colon  . Hypertension Sister   . Hyperlipidemia Brother   . Hypertension Daughter   . Heart attack Son   . Cancer Maternal Uncle     Social History   Tobacco Use  . Smoking status: Current Some Day Smoker    Years: 20.00    Types: Cigarettes  . Smokeless tobacco: Never Used  . Tobacco comment: smokes 2-3 cigs per day   Vaping Use  . Vaping Use: Never used  Substance Use Topics  . Alcohol use: Yes    Comment: Occ  . Drug use: No    Home Medications Prior to Admission  medications   Medication Sig Start Date End Date Taking? Authorizing Provider  acetaminophen (TYLENOL) 325 MG tablet Take 975 mg by mouth every 6 (six) hours as needed for headache (pain).   Yes [provider]  albuterol (PROVENTIL HFA;VENTOLIN HFA) 108 (90 Base) MCG/ACT inhaler Inhale 2 puffs into the lungs every 6 (six) hours as needed for wheezing or shortness of breath. 01/19/17  Yes Minus Liberty, MD  albuterol (PROVENTIL) (2.5 MG/3ML) 0.083% nebulizer solution Take 3 mLs (2.5 mg total) by nebulization every 6 (six) hours as needed for wheezing or shortness of breath. 02/08/19  Yes Yu, Amy V, PA-C  budesonide-formoterol (SYMBICORT) 160-4.5 MCG/ACT inhaler Inhale 2 puffs into the lungs 2 (two) times daily.   Yes [provider]  gabapentin (NEURONTIN) 300 MG capsule Take 300 mg by mouth 3 (three) times daily as needed (nerve pain).  07/17/19  Yes [provider]  zolpidem (AMBIEN) 10 MG tablet Take 10 mg by mouth at bedtime as needed for sleep.  07/18/19  Yes [provider]  Cetirizine HCl (ZYRTEC ALLERGY) 10 MG CAPS Take 1 capsule (10 mg total) by mouth daily as needed. Patient not taking: Reported on 07/12/2017 01/19/17   Minus Liberty, MD  diphenhydrAMINE (BENADRYL) 25 MG tablet Take 1 tablet (25 mg total) by mouth every 6 (six) hours as needed. Patient not taking: Reported on 07/12/2017 03/06/17   Ledell Noss, MD  fluticasone [facility]) 50 MCG/ACT nasal spray USE 1 SPRAY INTO BOTH NOSTRILS EVERY DAY Patient not taking: Reported on 07/12/2017 01/21/17   Ledell Noss, MD  ipratropium (ATROVENT) 0.06 % nasal spray Place 2 sprays into both nostrils 4 (four) times daily. Patient not taking: Reported on 04/25/2019 02/08/19   Ok Edwards, PA-C  omeprazole (PRILOSEC) 20 MG capsule Take 1 capsule (20 mg total) by mouth daily. Patient not taking: Reported on 07/12/2017 05/29/17   Ledell Noss, MD    Allergies    Latex, Penicillins, Tape, and Codeine  Review of  Systems   Review of Systems  Constitutional: Positive for appetite change, fatigue and fever. Negative for chills.  HENT: Positive for sore throat. Negative for congestion.   Eyes: Negative for pain.  Respiratory: Positive for cough and shortness of breath.   Cardiovascular: Negative for chest pain and leg swelling.  Gastrointestinal: Negative for abdominal pain and vomiting.  Genitourinary: Negative for dysuria.  Musculoskeletal: Positive for myalgias.  Skin:  Negative for rash.  Neurological: Negative for dizziness and headaches.    Physical Exam Updated Vital Signs BP 133/80   Pulse (!) 110   Temp (!) 103.9 F (39.9 C) (Rectal)   Resp (!) 31   Ht _0  (1.6 m)   Wt 104.3 kg   SpO2 94%   BMI 40.74 kg/m   Physical Exam Vitals and nursing note reviewed.  Constitutional:      General: She is in acute distress.     Appearance: She is obese. She is not diaphoretic.  HENT:     Head: Normocephalic and atraumatic.     Nose: Nose normal.     Mouth/Throat:     Mouth: Mucous membranes are dry.     Comments: Posterior pharynx with midline uvula, no visible tonsils. Tongue is dry. Eyes:     General: No scleral icterus. Cardiovascular:     Rate and Rhythm: Regular rhythm. Tachycardia present.     Pulses: Normal pulses.     Heart sounds: Normal heart sounds.  Pulmonary:     Effort: Pulmonary effort is normal. No respiratory distress.     Breath sounds: Rales present. No wheezing.     Comments: Diffuse rales with coarse breath sounds bilaterally. Mild increased WOB but tachypnea present w rate of 24-28. Abdominal:     Palpations: Abdomen is soft.     Tenderness: There is no abdominal tenderness. There is no guarding or rebound.     Comments: Obese, non-tender abdomen.  Musculoskeletal:     Cervical back: Normal range of motion.     Right lower leg: No edema.     Left lower leg: No edema.  Skin:    General: Skin is warm and dry.     Capillary Refill: Capillary refill takes  less than 2 seconds.  Neurological:     Mental Status: She is alert. Mental status is at baseline.  Psychiatric:        Mood and Affect: Mood normal.        Behavior: Behavior normal.     ED Results / Procedures / Treatments   Labs (all labs ordered are listed, but only abnormal results are displayed) Labs Reviewed  SARS CORONAVIRUS 2 BY RT PCR ([facility]) - Abnormal; Notable for the following components:      Result Value   SARS Coronavirus 2 POSITIVE (*)    All other components within normal limits  CBC WITH DIFFERENTIAL/PLATELET - Abnormal; Notable for the following components:   Lymphs Abs 0.6 (*)    All other components within normal limits  COMPREHENSIVE METABOLIC PANEL - Abnormal; Notable for the following components:   Glucose, Bld 117 (*)    Creatinine, Ser 1.21 (*)    Calcium 8.5 (*)    Albumin 3.1 (*)    AST 97 (*)    ALT 116 (*)    GFR calc non Af Amer 47 (*)    GFR calc Af Amer 54 (*)    All other components within normal limits  D-DIMER, QUANTITATIVE (NOT AT [facility]) - Abnormal; Notable for the following components:   D-Dimer, Quant 4.17 (*)    All other components within normal limits  LACTATE DEHYDROGENASE - Abnormal; Notable for the following components:   LDH 632 (*)    All other components within normal limits  FERRITIN - Abnormal; Notable for the following components:   Ferritin 852 (*)    All other components within normal limits  FIBRINOGEN - Abnormal; Notable for the following components:   Fibrinogen 711 (*)    All other components within normal limits  C-REACTIVE PROTEIN - Abnormal; Notable for the following components:   CRP 16.6 (*)    All other components within normal limits  BLOOD GAS, VENOUS - Abnormal; Notable for the following components:   pO2, Ven 27.3 (*)    All other components within normal limits  CULTURE, BLOOD (ROUTINE X 2)  CULTURE, BLOOD (ROUTINE X 2)  PROCALCITONIN  TRIGLYCERIDES    LACTIC ACID, PLASMA  LACTIC ACID, PLASMA  HIV ANTIBODY (ROUTINE TESTING W REFLEX)  CBC WITH DIFFERENTIAL/PLATELET  C-REACTIVE PROTEIN  COMPREHENSIVE METABOLIC PANEL  D-DIMER, QUANTITATIVE (NOT AT [facility])  FERRITIN  MAGNESIUM  PHOSPHORUS  ABO/RH    EKG EKG Interpretation  Date/Time:  Monday April 05 2020 16:07:58 EDT Ventricular Rate:  107 PR Interval:    QRS Duration: 83 QT Interval:  328 QTC Calculation: 438 R Axis:   37 Text Interpretation: Sinus tachycardia Atrial premature complexes No significant change since last tracing Confirmed by Calvert Cantor [id number]) on 04/05/2020 4:22:31 PM   Radiology DG Chest Port 1 View  Result Date: 04/05/2020 CLINICAL DATA:  Shortness of breath and hypoxia, COVID-19 positivity EXAM: PORTABLE CHEST 1 VIEW COMPARISON:  10/12/2016 FINDINGS: Cardiac shadow is mildly prominent but accentuated by the portable technique. Diffuse bilateral patchy airspace opacities are noted consistent with the given clinical history. No sizable effusion is seen. No bony abnormality is noted. IMPRESSION: Changes consistent with COVID-19 pneumonia. Electronically Signed   By: Inez Catalina M.D.   On: 04/05/2020 15:30    Procedures .Critical Care Performed by: Tedd Sias, PA Authorized by: Tedd Sias, PA   Critical care provider statement:    Critical care time (minutes):  45   Critical care was necessary to treat or prevent imminent or life-threatening deterioration of the following conditions:  Respiratory failure   Critical care was time spent personally by me on the following activities:  Discussions with consultants, evaluation of patient's response to treatment, examination of patient, ordering and performing treatments and interventions, ordering and review of laboratory studies, ordering and review of radiographic studies, pulse oximetry, re-evaluation of patient's condition, obtaining history from patient or surrogate and review of old charts    (including critical care time)  Medications Ordered in ED Medications  remdesivir 200 mg in sodium chloride 0.9% 250 mL IVPB (has no administration in time range)    Followed by  remdesivir 100 mg in sodium chloride 0.9 % 100 mL IVPB (has no administration in time range)  Ipratropium-Albuterol (COMBIVENT) respimat 1 puff (has no administration in time range)  methylPREDNISolone sodium succinate (SOLU-MEDROL) 125 mg/2 mL injection 51.875 mg (has no administration in time range)  guaiFENesin-dextromethorphan (ROBITUSSIN DM) 100-10 MG/5ML syrup 10 mL (has no administration in time range)  chlorpheniramine-HYDROcodone (TUSSIONEX) 10-8 MG/5ML suspension 5 mL (has no administration in time range)  ascorbic acid (VITAMIN C) tablet 500 mg (has no administration in time range)  zinc sulfate capsule 220 mg (has no administration in time range)  pantoprazole (PROTONIX) EC tablet 40 mg (has no administration in time range)  acetaminophen (TYLENOL) tablet 650 mg (has no administration in time range)  oxyCODONE (Oxy IR/ROXICODONE) immediate release tablet 5 mg (has no administration in time range)  zolpidem (AMBIEN) tablet 5 mg (has no administration in time range)  polyethylene glycol (MIRALAX / GLYCOLAX) packet 17 g (has no administration in time range)  bisacodyl (DULCOLAX) EC  tablet 5 mg (has no administration in time range)  magnesium citrate solution 1 Bottle (has no administration in time range)  ondansetron (ZOFRAN) tablet 4 mg (has no administration in time range)    Or  ondansetron (ZOFRAN) injection 4 mg (has no administration in time range)  enoxaparin (LOVENOX) injection 40 mg (has no administration in time range)  acetaminophen (TYLENOL) tablet 1,000 mg (1,000 mg Oral Given 04/05/20 1600)  dexamethasone (DECADRON) injection 10 mg (10 mg Intravenous Given 04/05/20 1602)    ED Course  I have reviewed the triage vital signs and the nursing notes.  Pertinent labs & imaging results that were  available during my care of the patient were reviewed by me and considered in my medical decision making (see chart for details).  COVID  + female with PMH above presenting with profound hypoxia, crackles in both lungs, tachypnea, fever, and elevated inflammatory markers.   PE significant for tachypnea, mildly increased WOB d/t tachypnea, fever, normal blood pressure and hypoxia now better on oxygen. Course lung sounds with crackles diffusely.   Will administer decadron and tylenol and wait for blood work before ordering remdesirvir.  Clinical Course as of Apr 05 1716  Mon Apr 05, 2020  1621 Consulted with critical care. Recommends patient to be placed on bipap and admitted to hospitalist to step down.   [JR]  [place] reporting pt unable to be placed on bipap while in the ED due to lack of supplies. Will discuss with hospitalist.   [JR]    Clinical Course User Index [JR] Robinson, [place] N, PA-C   I discussed this case with my attending physician who cosigned this note including patient's presenting symptoms, physical exam, and planned diagnostics and interventions. Attending physician stated agreement with plan or made changes to plan which were implemented.   Attending physician assessed patient at bedside.  Patient care signed out to [place] R PA-C pending results of CMP will likely require admission for severe hypoxia and respiratory failure 2/2 COVID 19.   Doubt additional acute emergent condition such as PE/ACS/Thoracic aoritic dissection or other.    MDM Rules/Calculators/A&P                          Tiffany Coleman was evaluated in Emergency Department on 04/05/2020 for the symptoms described in the history of present illness. She was evaluated in the context of the global COVID-19 pandemic, which necessitated consideration that the patient might be at risk for infection with the SARS-CoV-2 virus that causes COVID-19. Institutional protocols and algorithms that  pertain to the evaluation of patients at risk for COVID-19 are in a state of rapid change based on information released by regulatory bodies including the CDC and federal and state organizations. These policies and algorithms were followed during the patient's care in the ED.  Final Clinical Impression(s) / ED Diagnoses Final diagnoses:  COVID-19  Hypoxia    Rx / DC Orders ED Discharge Orders    None       Tedd Sias, [place] 04/05/20 1717    Quintella Reichert, MD 04/06/20 1301

## 2020-04-06 LAB — CBC WITH DIFFERENTIAL/PLATELET
Abs Immature Granulocytes: 0.05 10*3/uL (ref 0.00–0.07)
Basophils Absolute: 0 10*3/uL (ref 0.0–0.1)
Basophils Relative: 0 %
Eosinophils Absolute: 0 10*3/uL (ref 0.0–0.5)
Eosinophils Relative: 0 %
HCT: 39.1 % (ref 36.0–46.0)
Hemoglobin: 12.3 g/dL (ref 12.0–15.0)
Immature Granulocytes: 1 %
Lymphocytes Relative: 15 %
Lymphs Abs: 0.8 10*3/uL (ref 0.7–4.0)
MCH: 27.5 pg (ref 26.0–34.0)
MCHC: 31.5 g/dL (ref 30.0–36.0)
MCV: 87.3 fL (ref 80.0–100.0)
Monocytes Absolute: 0.1 10*3/uL (ref 0.1–1.0)
Monocytes Relative: 3 %
Neutro Abs: 4.4 10*3/uL (ref 1.7–7.7)
Neutrophils Relative %: 81 %
Platelets: 201 10*3/uL (ref 150–400)
RBC: 4.48 MIL/uL (ref 3.87–5.11)
RDW: 14.6 % (ref 11.5–15.5)
WBC: 5.5 10*3/uL (ref 4.0–10.5)
nRBC: 0 % (ref 0.0–0.2)

## 2020-04-06 LAB — COMPREHENSIVE METABOLIC PANEL
ALT: 93 U/L — ABNORMAL HIGH (ref 0–44)
AST: 80 U/L — ABNORMAL HIGH (ref 15–41)
Albumin: 2.7 g/dL — ABNORMAL LOW (ref 3.5–5.0)
Alkaline Phosphatase: 59 U/L (ref 38–126)
Anion gap: 10 (ref 5–15)
BUN: 19 mg/dL (ref 8–23)
CO2: 22 mmol/L (ref 22–32)
Calcium: 7.7 mg/dL — ABNORMAL LOW (ref 8.9–10.3)
Chloride: 102 mmol/L (ref 98–111)
Creatinine, Ser: 0.96 mg/dL (ref 0.44–1.00)
GFR calc Af Amer: >60 mL/min (ref >60)
GFR calc non Af Amer: >60 mL/min (ref >60)
Glucose, Bld: 130 mg/dL — ABNORMAL HIGH (ref 70–99)
Potassium: 4 mmol/L (ref 3.5–5.1)
Sodium: 134 mmol/L — ABNORMAL LOW (ref 135–145)
Total Bilirubin: 0.4 mg/dL (ref 0.3–1.2)
Total Protein: 6.5 g/dL (ref 6.5–8.1)

## 2020-04-06 LAB — D-DIMER, QUANTITATIVE: D-Dimer, Quant: 4.59 ug/mL-FEU — ABNORMAL HIGH (ref 0.00–0.50)

## 2020-04-06 LAB — PHOSPHORUS: Phosphorus: 2.9 mg/dL (ref 2.5–4.6)

## 2020-04-06 LAB — FERRITIN: Ferritin: 731 ng/mL — ABNORMAL HIGH (ref 11–307)

## 2020-04-06 LAB — MRSA PCR SCREENING: MRSA by PCR: NEGATIVE

## 2020-04-06 LAB — C-REACTIVE PROTEIN: CRP: 13.7 mg/dL — ABNORMAL HIGH (ref <1.0)

## 2020-04-06 LAB — MAGNESIUM: Magnesium: 2.3 mg/dL (ref 1.7–2.4)

## 2020-04-06 LAB — GLUCOSE, CAPILLARY: Glucose-Capillary: 143 mg/dL — ABNORMAL HIGH (ref 70–99)

## 2020-04-06 MED ORDER — CHLORHEXIDINE GLUCONATE CLOTH 2 % EX PADS
6.0000 | MEDICATED_PAD | Freq: Every day | CUTANEOUS | Status: DC
  Administered 2020-04-06 – 2020-04-11 (×7): 6 via TOPICAL

## 2020-04-06 MED ORDER — BARICITINIB 2 MG PO TABS
2.0000 mg | ORAL_TABLET | Freq: Once | ORAL | Status: AC
  Administered 2020-04-06: 2 mg via ORAL
  Filled 2020-04-06: qty 1

## 2020-04-06 MED ORDER — SALINE SPRAY 0.65 % NA SOLN
1.0000 | NASAL | Status: DC | PRN
  Administered 2020-04-15 – 2020-05-02 (×5): 1 via NASAL
  Filled 2020-04-06: qty 44

## 2020-04-06 MED ORDER — OXYMETAZOLINE HCL 0.05 % NA SOLN
1.0000 | Freq: Two times a day (BID) | NASAL | Status: DC
  Administered 2020-04-06 – 2020-05-09 (×62): 1 via NASAL
  Filled 2020-04-06: qty 15

## 2020-04-06 MED ORDER — HYDRALAZINE HCL 25 MG PO TABS
25.0000 mg | ORAL_TABLET | Freq: Four times a day (QID) | ORAL | Status: DC | PRN
  Administered 2020-04-07 – 2020-04-08 (×2): 25 mg via ORAL
  Filled 2020-04-06 (×2): qty 1

## 2020-04-06 MED ORDER — METHYLPREDNISOLONE SODIUM SUCC 125 MG IJ SOLR: 80.0000 mg | Freq: Two times a day (BID) | INTRAMUSCULAR | Status: DC

## 2020-04-06 MED ORDER — LIP MEDEX EX OINT
TOPICAL_OINTMENT | CUTANEOUS | Status: DC | PRN
  Administered 2020-05-02: 1 via TOPICAL
  Filled 2020-04-06 (×2): qty 7

## 2020-04-06 MED ORDER — ENOXAPARIN SODIUM 60 MG/0.6ML ~~LOC~~ SOLN
50.0000 mg | Freq: Two times a day (BID) | SUBCUTANEOUS | Status: DC
  Administered 2020-04-06 – 2020-04-27 (×42): 50 mg via SUBCUTANEOUS
  Filled 2020-04-06: qty 0.6
  Filled 2020-04-06: qty 0.5
  Filled 2020-04-06: qty 0.6
  Filled 2020-04-06: qty 0.5
  Filled 2020-04-06 (×2): qty 0.6
  Filled 2020-04-06 (×2): qty 0.5
  Filled 2020-04-06 (×6): qty 0.6
  Filled 2020-04-06: qty 0.5
  Filled 2020-04-06 (×6): qty 0.6
  Filled 2020-04-06: qty 0.5
  Filled 2020-04-06 (×5): qty 0.6
  Filled 2020-04-06 (×4): qty 0.5
  Filled 2020-04-06: qty 0.6
  Filled 2020-04-06: qty 0.5
  Filled 2020-04-06 (×5): qty 0.6
  Filled 2020-04-06: qty 0.5
  Filled 2020-04-06 (×3): qty 0.6
  Filled 2020-04-06: qty 0.5

## 2020-04-06 MED ORDER — METHYLPREDNISOLONE SODIUM SUCC 125 MG IJ SOLR
80.0000 mg | Freq: Two times a day (BID) | INTRAMUSCULAR | Status: DC
  Administered 2020-04-06 – 2020-04-18 (×25): 80 mg via INTRAVENOUS
  Filled 2020-04-06 (×24): qty 2

## 2020-04-06 MED ORDER — BARICITINIB 2 MG PO TABS
4.0000 mg | ORAL_TABLET | Freq: Every day | ORAL | Status: AC
  Administered 2020-04-07 – 2020-04-18 (×12): 4 mg via ORAL
  Filled 2020-04-06 (×12): qty 2

## 2020-04-06 MED ORDER — ORAL CARE MOUTH RINSE
15.0000 mL | Freq: Two times a day (BID) | OROMUCOSAL | Status: DC
  Administered 2020-04-06 – 2020-05-11 (×66): 15 mL via OROMUCOSAL

## 2020-04-06 NOTE — Progress Notes (Signed)
Discussed pt status with daughter Tenny Craw. Per daughter, abd mesh causes pain when pt laying on stomach, pt confirmed. Encouraged her to rotate from side to side. O2 sat 96% and holding steady. Will continue to monitor

## 2020-04-06 NOTE — ED Notes (Signed)
Patient's oxygen saturations dropped to 75% Patient sat up in bed, maxed on high flow nasal cannula and non rebreather. Respiratory to bedside.  Patient mentation is a GCS of 15.  Will continue to monitor.  Patient saturations came up to 92%

## 2020-04-06 NOTE — ED Notes (Signed)
Tiffany Coleman, daughter, [phone] would like a phone call on her mothers condition.

## 2020-04-06 NOTE — Progress Notes (Signed)
PROGRESS NOTE  Tiffany Coleman  QAS:[id number] DOB: [birth year] DOA: 04/05/2020 PCP: Nolene Ebbs, MD   Brief Narrative: Tiffany Coleman is a 64 y.o. female with a history of morbid obesity, OSA not on CPAP, GAD, GERD, and recent covid-19 diagnosis at PCP office who presented to the ED 8/23 having been found passed out and in respiratory distress at home. In the ED, she was febrile, tachycardic, tachypneic 78% on 15L NRB with hypoxia confirmed pO2 27 on VBG. CTA chest was performed showing no PE but did reveal extensive bilateral parenchymal infiltrates consistent with covid-19 pneumonia. Remdesivir, steroids, and baricitinib started and the patient was admitted to medicine service after consultation with PCCM.  Assessment & Plan: Active Problems:   Acute hypoxemic respiratory failure due to COVID-19 [facility])  Acute hypoxemic respiratory failure due to covid-19 pneumonia: SARS-CoV-2 testing reportedly positive ~8/16 at PCP. - Continue remdesivir x5 days (8/23 - 8/27) - Continue steroids, will augment to methylprednisolone 70m IV q12h - Continue baricitinib, started 8/23, for 14 days or until hospital discharge. Will add 241mdose this AM and change subsequent doses to 40m23mased on improved CrCl, now >43m76mn. - Monitor procalcitonin, WBC is wnl, consider empiric abx if evidence of bacterial infection arises. - Encourage OOB, IS, FV, and awake proning if able - Continue airborne, contact precautions for 21 days from positive testing. - Enoxaparin intermediate dosing, CTA chest showed no PE. Check LE U/S  History of asthma: No current wheezing.  - Continue BDs and steroids.   OSA: Pt not on CPAP at home.  - BiPAP/CPAP if evidence of ventilatory failure.  LFT elevation: Likely due to viral illness, improving. - Monitor in AM.   AKI:  - Improving. Continue monitoring.     Obesity: Estimated body mass index is 40.74 kg/m as calculated from the following:   Height as of  this encounter: _0  (1.6 m).   Weight as of this encounter: 104.3 kg.  DVT prophylaxis: Lovenox Code Status: Full Family Communication: Daughter by phone Disposition Plan:  Status is: Inpatient  Remains inpatient appropriate because:Inpatient level of care appropriate due to severity of illness  Dispo: The patient is from: Home              Anticipated d/c is to: TBD, based on clinical trajectory              Anticipated d/c date is: > 3 days              Patient currently is not medically stable to d/c.  Consultants:   None  Procedures:   None  Antimicrobials:  Remdesivir   Subjective: Shortness of breath is improved since arrival, denies chest pain. Short of breath with any movement at all, but able to rest. No orthopnea or leg swelling.   Objective: Vitals:   04/06/20 0800 04/06/20 0815 04/06/20 0830 04/06/20 0845  BP: 138/87 (!) 151/101 (!) 152/91 (!) 146/91  Pulse: 88 87 85 86  Resp: 16 (!) 25 (!) 28 (!) 21  Temp:      TempSrc:      SpO2: 93% (!) 85% 96% (!) 89%  Weight:      Height:        Intake/Output Summary (Last 24 hours) at 04/06/2020 0857 Last data filed at 04/05/2020 2344 Gross per 24 hour  Intake 250 ml  Output --  Net 250 ml   Filed Weights   04/05/20 1419  Weight: 104.3 kg    Gen: 64  y.o. female in no acute distress Pulm: Normal effort, rate ~24/min, crackles diffusely. CV: Regular rate and rhythm. No murmur, rub, or gallop. No JVD, no pedal edema. GI: Abdomen soft, non-tender, non-distended, with normoactive bowel sounds. No organomegaly or masses felt. Ext: Warm, no deformities Skin: No rashes, lesions or ulcers Neuro: Alert and oriented. No focal neurological deficits. Psych: Judgement and insight appear normal. Mood & affect appropriate.   Data Reviewed: I have personally reviewed following labs and imaging studies  CBC: Recent Labs  Lab 04/05/20 1443 04/06/20 0537  WBC 7.3 5.5  NEUTROABS 6.2 PENDING  HGB 12.8 12.3  HCT  40.3 39.1  MCV 86.5 87.3  PLT 196 409   Basic Metabolic Panel: Recent Labs  Lab 04/05/20 1443 04/06/20 0537  NA 135 134*  K 3.9 4.0  CL 99 102  CO2 23 22  GLUCOSE 117* 130*  BUN 16 19  CREATININE 1.21* 0.96  CALCIUM 8.5* 7.7*  MG  --  2.3  PHOS  --  2.9   GFR: Estimated Creatinine Clearance: 66.6 mL/min (by C-G formula based on SCr of 0.96 mg/dL). Liver Function Tests: Recent Labs  Lab 04/05/20 1443 04/06/20 0537  AST 97* 80*  ALT 116* 93*  ALKPHOS 52 59  BILITOT 0.7 0.4  PROT 6.8 6.5  ALBUMIN 3.1* 2.7*   No results for input(s): LIPASE, AMYLASE in the last 168 hours. No results for input(s): AMMONIA in the last 168 hours. Coagulation Profile: No results for input(s): INR, PROTIME in the last 168 hours. Cardiac Enzymes: No results for input(s): CKTOTAL, CKMB, CKMBINDEX, TROPONINI in the last 168 hours. BNP (last 3 results) No results for input(s): PROBNP in the last 8760 hours. HbA1C: No results for input(s): HGBA1C in the last 72 hours. CBG: No results for input(s): GLUCAP in the last 168 hours. Lipid Profile: Recent Labs    04/05/20 1443  TRIG 148   Thyroid Function Tests: No results for input(s): TSH, T4TOTAL, FREET4, T3FREE, THYROIDAB in the last 72 hours. Anemia Panel: Recent Labs    04/05/20 1443  FERRITIN 852*   Urine analysis:    Component Value Date/Time   COLORURINE YELLOW 07/25/2019 [place] 07/25/2019 1343   APPEARANCEUR Cloudy (A) 04/25/2019 1132   LABSPEC 1.018 07/25/2019 1343   PHURINE 5.0 07/25/2019 1343   GLUCOSEU NEGATIVE 07/25/2019 1343   HGBUR NEGATIVE 07/25/2019 1343   BILIRUBINUR NEGATIVE 07/25/2019 1343   BILIRUBINUR Negative 04/25/2019 1132   KETONESUR NEGATIVE 07/25/2019 1343   PROTEINUR NEGATIVE 07/25/2019 1343   NITRITE NEGATIVE 07/25/2019 1343   LEUKOCYTESUR NEGATIVE 07/25/2019 1343   Recent Results (from the past 240 hour(s))  Blood Culture (routine x 2)     Status: None (Preliminary result)    Collection Time: 04/05/20  2:30 PM   Specimen: BLOOD LEFT FOREARM  Result Value Ref Range Status   Specimen Description   Final    BLOOD LEFT FOREARM Performed at [facility], [place] [address] [place], [place] [address]    Special Requests   Final    BOTTLES DRAWN AEROBIC AND ANAEROBIC Blood Culture adequate volume Performed at [facility] [address], [place], [place] [address]    Culture   Final    NO GROWTH < 24 HOURS Performed at [facility] [address], [place], [place] [address]    Report Status PENDING  Incomplete  SARS Coronavirus 2 by RT PCR (hospital order, performed in [facility] hospital lab)  Nasopharyngeal Nasopharyngeal Swab     Status: Abnormal   Collection Time: 04/05/20  2:43 PM   Specimen: Nasopharyngeal Swab  Result Value Ref Range Status   SARS Coronavirus 2 POSITIVE (A) NEGATIVE Final    Comment: RESULT CALLED TO, READ BACK BY AND VERIFIED WITH: ZULETA, K. RN _0  04/05/20 BILLINGSLEY,L (NOTE) SARS-CoV-2 target nucleic acids are DETECTED  SARS-CoV-2 RNA is generally detectable in upper respiratory specimens  during the acute phase of infection.  Positive results are indicative  of the presence of the identified virus, but do not rule out bacterial infection or co-infection with other pathogens not detected by the test.  Clinical correlation with patient history and  other diagnostic information is necessary to determine patient infection status.  The expected result is negative.  Fact Sheet for Patients:   StrictlyIdeas.no   Fact Sheet for Healthcare Providers:   BankingDealers.co.za    This test is not yet approved or cleared by the [place] FDA and  has been authorized for detection and/or diagnosis of SARS-CoV-2 by FDA under an Emergency Use Authorization (EUA).  This EUA will remain in effect (meaning  this test can be used) for the duration of    the COVID-19 declaration under Section 564(b)(1) of the Act, 21 U.S.C. section 360-bbb-3(b)(1), unless the authorization is terminated or revoked sooner.  Performed at [facility], [place] [address], [place], [place] [address]   Blood Culture (routine x 2)     Status: None (Preliminary result)   Collection Time: 04/05/20  2:43 PM   Specimen: BLOOD  Result Value Ref Range Status   Specimen Description   Final    BLOOD LEFT ANTECUBITAL Performed at [facility] [address], [place], [place] [address]    Special Requests   Final    BOTTLES DRAWN AEROBIC AND ANAEROBIC Blood Culture results may not be optimal due to an inadequate volume of blood received in culture bottles Performed at [facility] [address] [place], [place] [address]    Culture   Final    NO GROWTH < 24 HOURS Performed at [facility] [address] [place],  [address]    Report Status PENDING  Incomplete      Radiology Studies: CT Angio Chest PE W and/or Wo Contrast  Result Date: 04/05/2020 CLINICAL DATA:  PE suspected, high prob + COVID, elevated D-dimer EXAM: CT ANGIOGRAPHY CHEST WITH CONTRAST TECHNIQUE: Multidetector CT imaging of the chest was performed using the standard protocol during bolus administration of intravenous contrast. Multiplanar CT image reconstructions and MIPs were obtained to evaluate the vascular anatomy. CONTRAST:  34m OMNIPAQUE IOHEXOL 350 MG/ML SOLN COMPARISON:  Radiograph earlier today. FINDINGS: Cardiovascular: Evaluation limited by motion. Allowing for this, there are no filling defects within the pulmonary arteries to suggest pulmonary embolus. Prominence of the main pulmonary artery at 3.1 cm. Mild aortic atherosclerosis. No aortic dissection. Conventional branching pattern from the aortic arch. Mild multi chamber cardiomegaly. No pericardial effusion. Tiny hiatal hernia. No esophageal wall thickening.  Mediastinum/Nodes: Mild mediastinal adenopathy. AP window node measures 11 mm short axis. Right lower paratracheal node measures 13 mm short axis. Lungs/Pleura: Extensive ground-glass opacities and consolidative throughout both lungs with air bronchograms. Parenchymal involvement is advanced. Trace pleural thickening without significant effusion. Upper Abdomen: No acute findings. Musculoskeletal: Degenerative change in the spine. There are no acute or suspicious osseous abnormalities. Review of the MIP images confirms the above findings. IMPRESSION: 1. No pulmonary  embolus allowing for motion artifact limitations. 2. Extensive ground-glass opacities and consolidative throughout both lungs with air bronchograms, consistent with COVID-19 pneumonia, possible developing ARDS. Parenchymal involvement is advanced. 3. Mild mediastinal adenopathy is likely reactive. 4. Mild cardiomegaly. Prominence of the main pulmonary artery suggesting pulmonary arterial hypertension. Aortic Atherosclerosis (ICD10-I70.0). Electronically Signed   By: Keith Rake M.D.   On: 04/05/2020 21:13   DG Chest Port 1 View  Result Date: 04/05/2020 CLINICAL DATA:  Shortness of breath and hypoxia, COVID-19 positivity EXAM: PORTABLE CHEST 1 VIEW COMPARISON:  10/12/2016 FINDINGS: Cardiac shadow is mildly prominent but accentuated by the portable technique. Diffuse bilateral patchy airspace opacities are noted consistent with the given clinical history. No sizable effusion is seen. No bony abnormality is noted. IMPRESSION: Changes consistent with COVID-19 pneumonia. Electronically Signed   By: Inez Catalina M.D.   On: 04/05/2020 15:30    Scheduled Meds: . vitamin C  500 mg Oral Daily  . baricitinib  2 mg Oral Once  . [START ON 04/07/2020] baricitinib  4 mg Oral Daily  . enoxaparin (LOVENOX) injection  50 mg Subcutaneous Q12H  . Ipratropium-Albuterol  1 puff Inhalation Q6H  . methylPREDNISolone (SOLU-MEDROL) injection  80 mg Intravenous  Q12H  . pantoprazole  40 mg Oral Daily  . zinc sulfate  220 mg Oral Daily   Continuous Infusions: . sodium chloride 75 mL/hr at 04/05/20 1838  . remdesivir 100 mg in NS 100 mL 100 mg (04/06/20 0804)     LOS: 1 day   Time spent: 35 minutes.  Patrecia Pour, MD [facility] [email or web address] 04/06/2020, 8:57 AM

## 2020-04-07 LAB — CBC WITH DIFFERENTIAL/PLATELET
Abs Immature Granulocytes: 0.04 10*3/uL (ref 0.00–0.07)
Basophils Absolute: 0 10*3/uL (ref 0.0–0.1)
Basophils Relative: 0 %
Eosinophils Absolute: 0 10*3/uL (ref 0.0–0.5)
Eosinophils Relative: 0 %
HCT: 38 % (ref 36.0–46.0)
Hemoglobin: 11.5 g/dL — ABNORMAL LOW (ref 12.0–15.0)
Immature Granulocytes: 1 %
Lymphocytes Relative: 15 %
Lymphs Abs: 0.7 10*3/uL (ref 0.7–4.0)
MCH: 26.6 pg (ref 26.0–34.0)
MCHC: 30.3 g/dL (ref 30.0–36.0)
MCV: 87.8 fL (ref 80.0–100.0)
Monocytes Absolute: 0.2 10*3/uL (ref 0.1–1.0)
Monocytes Relative: 5 %
Neutro Abs: 3.8 10*3/uL (ref 1.7–7.7)
Neutrophils Relative %: 79 %
Platelets: 208 10*3/uL (ref 150–400)
RBC: 4.33 MIL/uL (ref 3.87–5.11)
RDW: 14.5 % (ref 11.5–15.5)
WBC: 4.8 10*3/uL (ref 4.0–10.5)
nRBC: 0 % (ref 0.0–0.2)

## 2020-04-07 LAB — COMPREHENSIVE METABOLIC PANEL
ALT: 86 U/L — ABNORMAL HIGH (ref 0–44)
AST: 71 U/L — ABNORMAL HIGH (ref 15–41)
Albumin: 2.6 g/dL — ABNORMAL LOW (ref 3.5–5.0)
Alkaline Phosphatase: 69 U/L (ref 38–126)
Anion gap: 10 (ref 5–15)
BUN: 23 mg/dL (ref 8–23)
CO2: 22 mmol/L (ref 22–32)
Calcium: 8.3 mg/dL — ABNORMAL LOW (ref 8.9–10.3)
Chloride: 104 mmol/L (ref 98–111)
Creatinine, Ser: 0.9 mg/dL (ref 0.44–1.00)
GFR calc Af Amer: >60 mL/min (ref >60)
GFR calc non Af Amer: >60 mL/min (ref >60)
Glucose, Bld: 166 mg/dL — ABNORMAL HIGH (ref 70–99)
Potassium: 3.9 mmol/L (ref 3.5–5.1)
Sodium: 136 mmol/L (ref 135–145)
Total Bilirubin: 0.5 mg/dL (ref 0.3–1.2)
Total Protein: 6.1 g/dL — ABNORMAL LOW (ref 6.5–8.1)

## 2020-04-07 LAB — GLUCOSE, CAPILLARY
Glucose-Capillary: 136 mg/dL — ABNORMAL HIGH (ref 70–99)
Glucose-Capillary: 141 mg/dL — ABNORMAL HIGH (ref 70–99)
Glucose-Capillary: 141 mg/dL — ABNORMAL HIGH (ref 70–99)
Glucose-Capillary: 143 mg/dL — ABNORMAL HIGH (ref 70–99)
Glucose-Capillary: 145 mg/dL — ABNORMAL HIGH (ref 70–99)
Glucose-Capillary: 157 mg/dL — ABNORMAL HIGH (ref 70–99)
Glucose-Capillary: 162 mg/dL — ABNORMAL HIGH (ref 70–99)

## 2020-04-07 LAB — D-DIMER, QUANTITATIVE: D-Dimer, Quant: 3.85 ug/mL-FEU — ABNORMAL HIGH (ref 0.00–0.50)

## 2020-04-07 LAB — C-REACTIVE PROTEIN: CRP: 7.5 mg/dL — ABNORMAL HIGH (ref <1.0)

## 2020-04-07 LAB — PROCALCITONIN: Procalcitonin: 0.3 ng/mL

## 2020-04-07 MED ORDER — PROSOURCE PLUS PO LIQD
30.0000 mL | Freq: Two times a day (BID) | ORAL | Status: DC
  Administered 2020-04-07 – 2020-04-12 (×8): 30 mL via ORAL
  Filled 2020-04-07 (×14): qty 30

## 2020-04-07 MED ORDER — KATE FARMS STANDARD 1.4 PO LIQD
325.0000 mL | Freq: Every day | ORAL | Status: DC
  Administered 2020-04-07 – 2020-04-13 (×5): 325 mL via ORAL
  Filled 2020-04-07 (×8): qty 325

## 2020-04-07 MED ORDER — LABETALOL HCL 5 MG/ML IV SOLN
10.0000 mg | INTRAVENOUS | Status: AC | PRN
  Administered 2020-04-08 – 2020-04-09 (×2): 10 mg via INTRAVENOUS
  Filled 2020-04-07 (×3): qty 4

## 2020-04-07 MED ORDER — ADULT MULTIVITAMIN W/MINERALS CH
1.0000 | ORAL_TABLET | Freq: Every day | ORAL | Status: DC
  Administered 2020-04-07 – 2020-05-11 (×35): 1 via ORAL
  Filled 2020-04-07 (×35): qty 1

## 2020-04-07 NOTE — Progress Notes (Signed)
PROGRESS NOTE  Tiffany Coleman  GYI:[id number] DOB: [birth year] DOA: 04/05/2020 PCP: Nolene Ebbs, MD   Brief Narrative: Tiffany Coleman is a 64 y.o. female with a history of morbid obesity, OSA not on CPAP, GAD, GERD, and recent covid-19 diagnosis at PCP office who presented to the ED 8/23 having been found passed out and in respiratory distress at home. In the ED, she was febrile, tachycardic, tachypneic 78% on 15L NRB with hypoxia confirmed pO2 27 on VBG. CTA chest was performed showing no PE but did reveal extensive bilateral parenchymal infiltrates consistent with covid-19 pneumonia. Remdesivir, steroids, and baricitinib started and the patient was admitted to medicine service after consultation with PCCM.  Assessment & Plan: Active Problems:   Acute hypoxemic respiratory failure due to COVID-19 [facility])  Acute hypoxemic respiratory failure due to covid-19 pneumonia:  - SARS-CoV-2 testing reportedly positive ~8/16 at PCP. - Continue remdesivir x5 days (8/23 - 8/27) - Continue steroids - methylprednisolone 23m IV q12h - Continue baricitinib, started 8/23, for 14 days or until hospital discharge.  - Monitor procalcitonin - Encourage OOB, IS, FV, and awake proning if able - Continue airborne, contact precautions for 21 days from positive testing. - Enoxaparin intermediate dosing, CTA chest showed no PE. SpO2: 94 % O2 Flow Rate (L/min): 15 L/min FiO2 (%): 100 % Recent Labs    04/05/20 1443 04/06/20 0537 04/07/20 0146  DDIMER 4.17* 4.59* 3.85*  FERRITIN 852* 731*  --   LDH 632*  --   --   CRP 16.6* 13.7* 7.5*   History of asthma: No current wheezing.  - Continue BDs and steroids.   OSA: Pt not on CPAP at home.  - Bipap HS per discussion with PCCM  LFT elevation: Likely due to viral illness, improving. Hepatic Function Latest Ref Rng & Units 04/07/2020 04/06/2020 04/05/2020  Total Protein 6.5 - 8.1 g/dL 6.1(L) 6.5 6.8  Albumin 3.5 - 5.0 g/dL 2.6(L) 2.7(L) 3.1(L)   AST 15 - 41 U/L 71(H) 80(H) 97(H)  ALT 0 - 44 U/L 86(H) 93(H) 116(H)  Alk Phosphatase 38 - 126 U/L 69 59 52  Total Bilirubin 0.3 - 1.2 mg/dL 0.5 0.4 0.7   AKI, without history of CKD, poa:  - Improving. Continue monitoring.     Obesity: Estimated body mass index is 40.74 kg/m as calculated from the following:   Height as of this encounter: _0  (1.6 m).   Weight as of this encounter: 104.3 kg.  DVT prophylaxis: Lovenox Code Status: Full Family Communication: Daughter by phone Disposition Plan:  Status is: Inpatient  Remains inpatient appropriate because:Inpatient level of care appropriate due to severity of illness  Dispo: The patient is from: Home              Anticipated d/c is to: TBD, based on clinical trajectory              Anticipated d/c date is: > 3 days              Patient currently is not medically stable to d/c.  Consultants:   None  Procedures:   None  Antimicrobials:  Remdesivir   Subjective: No acute issues/events overnight -continues to admit to shortness of breath worse with exertion but minimally improving since admission.  Denies nausea, vomiting, diarrhea, constipation, headache, fevers, chills.  Objective: Vitals:   04/07/20 0400 04/07/20 0439 04/07/20 0500 04/07/20 0600  BP: (!) 161/85  (!) 167/85 (!) 146/85  Pulse: 76 79 83 81  Resp: (Marland Kitchen  28 (!) 21 (!) 25 (!) 25  Temp:      TempSrc:      SpO2: 91% 95% 95% 93%  Weight:      Height:        Intake/Output Summary (Last 24 hours) at 04/07/2020 0734 Last data filed at 04/07/2020 0400 Gross per 24 hour  Intake 1751.55 ml  Output 200 ml  Net 1551.55 ml   Filed Weights   04/05/20 1419  Weight: 104.3 kg    Gen: 64 y.o. female in no acute distress Pulm: Normal effort, rate ~24/min, crackles diffusely. CV: Regular rate and rhythm. No murmur, rub, or gallop. No JVD, no pedal edema. GI: Abdomen soft, non-tender, non-distended, with normoactive bowel sounds. No organomegaly or masses  felt. Ext: Warm, no deformities Skin: No rashes, lesions or ulcers Neuro: Alert and oriented. No focal neurological deficits. Psych: Judgement and insight appear normal. Mood & affect appropriate.   Data Reviewed: I have personally reviewed following labs and imaging studies  CBC: Recent Labs  Lab 04/05/20 1443 04/06/20 0537 04/07/20 0146  WBC 7.3 5.5 4.8  NEUTROABS 6.2 4.4 3.8  HGB 12.8 12.3 11.5*  HCT 40.3 39.1 38.0  MCV 86.5 87.3 87.8  PLT 196 201 315   Basic Metabolic Panel: Recent Labs  Lab 04/05/20 1443 04/06/20 0537 04/07/20 0146  NA 135 134* 136  K 3.9 4.0 3.9  CL 99 102 104  CO2 _0 GLUCOSE 117* 130* 166*  BUN _1 CREATININE 1.21* 0.96 0.90  CALCIUM 8.5* 7.7* 8.3*  MG  --  2.3  --   PHOS  --  2.9  --    GFR: Estimated Creatinine Clearance: 71.1 mL/min (by C-G formula based on SCr of 0.9 mg/dL). Liver Function Tests: Recent Labs  Lab 04/05/20 1443 04/06/20 0537 04/07/20 0146  AST 97* 80* 71*  ALT 116* 93* 86*  ALKPHOS 52 59 69  BILITOT 0.7 0.4 0.5  PROT 6.8 6.5 6.1*  ALBUMIN 3.1* 2.7* 2.6*   No results for input(s): LIPASE, AMYLASE in the last 168 hours. No results for input(s): AMMONIA in the last 168 hours. Coagulation Profile: No results for input(s): INR, PROTIME in the last 168 hours. Cardiac Enzymes: No results for input(s): CKTOTAL, CKMB, CKMBINDEX, TROPONINI in the last 168 hours. BNP (last 3 results) No results for input(s): PROBNP in the last 8760 hours. HbA1C: No results for input(s): HGBA1C in the last 72 hours. CBG: Recent Labs  Lab 04/06/20 2002 04/07/20 0015 04/07/20 0427  GLUCAP 143* 141* 143*   Lipid Profile: Recent Labs    04/05/20 1443  TRIG 148   Thyroid Function Tests: No results for input(s): TSH, T4TOTAL, FREET4, T3FREE, THYROIDAB in the last 72 hours. Anemia Panel: Recent Labs    04/05/20 1443 04/06/20 0537  FERRITIN 852* 731*   Urine analysis:    Component Value Date/Time   COLORURINE  YELLOW 07/25/2019 [place] 07/25/2019 1343   APPEARANCEUR Cloudy (A) 04/25/2019 1132   LABSPEC 1.018 07/25/2019 1343   PHURINE 5.0 07/25/2019 1343   GLUCOSEU NEGATIVE 07/25/2019 1343   HGBUR NEGATIVE 07/25/2019 1343   BILIRUBINUR NEGATIVE 07/25/2019 1343   BILIRUBINUR Negative 04/25/2019 1132   KETONESUR NEGATIVE 07/25/2019 1343   PROTEINUR NEGATIVE 07/25/2019 1343   NITRITE NEGATIVE 07/25/2019 1343   LEUKOCYTESUR NEGATIVE 07/25/2019 1343   Recent Results (from the past 240 hour(s))  Blood Culture (routine x 2)     Status: None (Preliminary result)  Collection Time: 04/05/20  2:30 PM   Specimen: BLOOD LEFT FOREARM  Result Value Ref Range Status   Specimen Description   Final    BLOOD LEFT FOREARM Performed at [facility] [address], [place], [place] [address]    Special Requests   Final    BOTTLES DRAWN AEROBIC AND ANAEROBIC Blood Culture adequate volume Performed at [facility] [address] [place], [place] [address]    Culture   Final    NO GROWTH < 24 HOURS Performed at [facility] [address] [place], [place] [address]    Report Status PENDING  Incomplete  SARS Coronavirus 2 by RT PCR (hospital order, performed in [facility] hospital lab) Nasopharyngeal Nasopharyngeal Swab     Status: Abnormal   Collection Time: 04/05/20  2:43 PM   Specimen: Nasopharyngeal Swab  Result Value Ref Range Status   SARS Coronavirus 2 POSITIVE (A) NEGATIVE Final    Comment: RESULT CALLED TO, READ BACK BY AND VERIFIED WITH: ZULETA, K. RN _0  04/05/20 BILLINGSLEY,L (NOTE) SARS-CoV-2 target nucleic acids are DETECTED  SARS-CoV-2 RNA is generally detectable in upper respiratory specimens  during the acute phase of infection.  Positive results are indicative  of the presence of the identified virus, but do not rule out bacterial infection or co-infection with other pathogens not detected by the test.  Clinical  correlation with patient history and  other diagnostic information is necessary to determine patient infection status.  The expected result is negative.  Fact Sheet for Patients:   StrictlyIdeas.no   Fact Sheet for Healthcare Providers:   BankingDealers.co.za    This test is not yet approved or cleared by the [place] FDA and  has been authorized for detection and/or diagnosis of SARS-CoV-2 by FDA under an Emergency Use Authorization (EUA).  This EUA will remain in effect (meaning  this test can be used) for the duration of  the COVID-19 declaration under Section 564(b)(1) of the Act, 21 U.S.C. section 360-bbb-3(b)(1), unless the authorization is terminated or revoked sooner.  Performed at [facility], [place] [address] [place], [place] [address]   Blood Culture (routine x 2)     Status: None (Preliminary result)   Collection Time: 04/05/20  2:43 PM   Specimen: BLOOD  Result Value Ref Range Status   Specimen Description   Final    BLOOD LEFT ANTECUBITAL Performed at [facility] [address], [place], [place] [address]    Special Requests   Final    BOTTLES DRAWN AEROBIC AND ANAEROBIC Blood Culture results may not be optimal due to an inadequate volume of blood received in culture bottles Performed at [facility] [address], [place], [place] [address]    Culture   Final    NO GROWTH < 24 HOURS Performed at [facility] [address], [place], [place] [address]    Report Status PENDING  Incomplete  MRSA PCR Screening     Status: None   Collection Time: 04/06/20  5:22 PM   Specimen: Nasal Mucosa; Nasopharyngeal  Result Value Ref Range Status   MRSA by PCR NEGATIVE NEGATIVE Final    Comment:        The GeneXpert MRSA Assay (FDA approved for NASAL specimens only), is one component of a comprehensive MRSA colonization surveillance program. It is  not intended to diagnose MRSA infection nor to guide or monitor treatment for MRSA infections. Performed  at [facility], [place] [address], [place], [place] [address]       Radiology Studies: CT Angio Chest PE W and/or Wo Contrast  Result Date: 04/05/2020 CLINICAL DATA:  PE suspected, high prob + COVID, elevated D-dimer EXAM: CT ANGIOGRAPHY CHEST WITH CONTRAST TECHNIQUE: Multidetector CT imaging of the chest was performed using the standard protocol during bolus administration of intravenous contrast. Multiplanar CT image reconstructions and MIPs were obtained to evaluate the vascular anatomy. CONTRAST:  47m OMNIPAQUE IOHEXOL 350 MG/ML SOLN COMPARISON:  Radiograph earlier today. FINDINGS: Cardiovascular: Evaluation limited by motion. Allowing for this, there are no filling defects within the pulmonary arteries to suggest pulmonary embolus. Prominence of the main pulmonary artery at 3.1 cm. Mild aortic atherosclerosis. No aortic dissection. Conventional branching pattern from the aortic arch. Mild multi chamber cardiomegaly. No pericardial effusion. Tiny hiatal hernia. No esophageal wall thickening. Mediastinum/Nodes: Mild mediastinal adenopathy. AP window node measures 11 mm short axis. Right lower paratracheal node measures 13 mm short axis. Lungs/Pleura: Extensive ground-glass opacities and consolidative throughout both lungs with air bronchograms. Parenchymal involvement is advanced. Trace pleural thickening without significant effusion. Upper Abdomen: No acute findings. Musculoskeletal: Degenerative change in the spine. There are no acute or suspicious osseous abnormalities. Review of the MIP images confirms the above findings. IMPRESSION: 1. No pulmonary embolus allowing for motion artifact limitations. 2. Extensive ground-glass opacities and consolidative throughout both lungs with air bronchograms, consistent with COVID-19 pneumonia, possible developing ARDS. Parenchymal  involvement is advanced. 3. Mild mediastinal adenopathy is likely reactive. 4. Mild cardiomegaly. Prominence of the main pulmonary artery suggesting pulmonary arterial hypertension. Aortic Atherosclerosis (ICD10-I70.0). Electronically Signed   By: MKeith RakeM.D.   On: 04/05/2020 21:13   DG Chest Port 1 View  Result Date: 04/05/2020 CLINICAL DATA:  Shortness of breath and hypoxia, COVID-19 positivity EXAM: PORTABLE CHEST 1 VIEW COMPARISON:  10/12/2016 FINDINGS: Cardiac shadow is mildly prominent but accentuated by the portable technique. Diffuse bilateral patchy airspace opacities are noted consistent with the given clinical history. No sizable effusion is seen. No bony abnormality is noted. IMPRESSION: Changes consistent with COVID-19 pneumonia. Electronically Signed   By: MInez CatalinaM.D.   On: 04/05/2020 15:30    Scheduled Meds: . vitamin C  500 mg Oral Daily  . baricitinib  4 mg Oral Daily  . Chlorhexidine Gluconate Cloth  6 each Topical Daily  . enoxaparin (LOVENOX) injection  50 mg Subcutaneous Q12H  . Ipratropium-Albuterol  1 puff Inhalation Q6H  . mouth rinse  15 mL Mouth Rinse BID  . methylPREDNISolone (SOLU-MEDROL) injection  80 mg Intravenous BID  . oxymetazoline  1 spray Each Nare BID  . pantoprazole  40 mg Oral Daily  . zinc sulfate  220 mg Oral Daily   Continuous Infusions: . remdesivir 100 mg in NS 100 mL Stopped (04/06/20 0837)     LOS: 2 days   Time spent: 35 minutes.  WLittle Ishikawa DO [facility] [email or web address] 04/07/2020, 7:34 AM

## 2020-04-07 NOTE — Progress Notes (Signed)
Initial Nutrition Assessment  DOCUMENTATION CODES:   Obesity unspecified  INTERVENTION:  - will order [employer] 1.4 po once/day, each supplement provides 455 kcal and 20 grams protein. - will order 30 mL Prosource Plus BID, each supplement provides 100 kcal and 15 grams of protein. - will order 1 tablet multivitamin with minerals/day.  - will complete NFPE if it is deemed to be helpful in determining further nutrition-related care.   NUTRITION DIAGNOSIS:   Increased nutrient needs related to acute illness, catabolic illness (OEVOJ-50 PNA) as evidenced by estimated needs.  GOAL:   Patient will meet greater than or equal to 90% of their needs  MONITOR:   PO intake, Supplement acceptance, Labs, Weight trends  REASON FOR ASSESSMENT:   Malnutrition Screening Tool  ASSESSMENT:   64 y.o. female with medical history of morbid obesity, OSA not on CPAP, GAD, GERD, and recent COVID-19 diagnosis at PCP office. She presented to the ED 8/23 having been found passed out and in respiratory distress at home. In the ED, she was febrile, tachycardic, tachypneic 78% on 15L NRB with hypoxia. CT angio chest showed no PE but did show extensive bilateral parenchymal infiltrates consistent with COVID-19 PNA. Remdesivir, steroids, and baricitinib started.  Able to talk with RN who reports that patient has difficulty proning d/t hx of hernia repair with mesh placement and discomfort from this when laying on her stomach. Patient has been side laying without issue. Patient ate a few bites each of grits, toast, bacon for breakfast.  Patient reported to RN that she has had a poor appetite for at least a few days PTA but that appetite is slightly improved today.   Weight on 8/23 was documented as 230 lb, which appears to be a stated weight. RN reports that patient informed her of UBW of 232 lb. RN weighed patient today and she weighs 217 lb.   Patient briefly discussed in rounds.   Per notes:  - COVID-19  PNA (positive 8/16) - AKI--improving   Labs reviewed; Ca: 8.3 mg/dl. Medications reviewed; 500 mg ascorbic acid/day, 80 mg solu-medrol BID, 40 mg oral protonix/day, 220 mg zinc sulfate/day.     NUTRITION - FOCUSED PHYSICAL EXAM:  not completed at this time.   Diet Order:   Diet Order            Diet Carb Modified Fluid consistency: Thin; Room service appropriate? Yes  Diet effective now                 EDUCATION NEEDS:   No education needs have been identified at this time  Skin:  Skin Assessment: Reviewed RN Assessment  Last BM:  PTA/unknown  Height:   Ht Readings from Last 1 Encounters:  04/05/20 _0  (1.6 m)    Weight:   Wt Readings from Last 1 Encounters:  04/07/20 98.9 kg     Estimated Nutritional Needs:  Kcal:  1880-2075 kcal Protein:  100-110 grams Fluid:  >/= 2 L/day     Jarome Matin, MS, RD, LDN, CNSC Inpatient Clinical Dietitian RD pager # available in AMION  After hours/weekend pager # available in [facility]

## 2020-04-08 LAB — GLUCOSE, CAPILLARY
Glucose-Capillary: 126 mg/dL — ABNORMAL HIGH (ref 70–99)
Glucose-Capillary: 124 mg/dL — ABNORMAL HIGH (ref 70–99)
Glucose-Capillary: 153 mg/dL — ABNORMAL HIGH (ref 70–99)
Glucose-Capillary: 132 mg/dL — ABNORMAL HIGH (ref 70–99)
Glucose-Capillary: 178 mg/dL — ABNORMAL HIGH (ref 70–99)
Glucose-Capillary: 109 mg/dL — ABNORMAL HIGH (ref 70–99)

## 2020-04-08 LAB — CBC WITH DIFFERENTIAL/PLATELET
Abs Immature Granulocytes: 0.07 10*3/uL (ref 0.00–0.07)
Basophils Absolute: 0 10*3/uL (ref 0.0–0.1)
Basophils Relative: 0 %
Eosinophils Absolute: 0 10*3/uL (ref 0.0–0.5)
Eosinophils Relative: 0 %
HCT: 37.8 % (ref 36.0–46.0)
Hemoglobin: 11.7 g/dL — ABNORMAL LOW (ref 12.0–15.0)
Immature Granulocytes: 1 %
Lymphocytes Relative: 6 %
Lymphs Abs: 0.6 10*3/uL — ABNORMAL LOW (ref 0.7–4.0)
MCH: 26.7 pg (ref 26.0–34.0)
MCHC: 31 g/dL (ref 30.0–36.0)
MCV: 86.3 fL (ref 80.0–100.0)
Monocytes Absolute: 0.6 10*3/uL (ref 0.1–1.0)
Monocytes Relative: 7 %
Neutro Abs: 8 10*3/uL — ABNORMAL HIGH (ref 1.7–7.7)
Neutrophils Relative %: 86 %
Platelets: 233 10*3/uL (ref 150–400)
RBC: 4.38 MIL/uL (ref 3.87–5.11)
RDW: 14.2 % (ref 11.5–15.5)
WBC: 9.3 10*3/uL (ref 4.0–10.5)
nRBC: 0 % (ref 0.0–0.2)

## 2020-04-08 LAB — COMPREHENSIVE METABOLIC PANEL
ALT: 108 U/L — ABNORMAL HIGH (ref 0–44)
AST: 88 U/L — ABNORMAL HIGH (ref 15–41)
Albumin: 2.8 g/dL — ABNORMAL LOW (ref 3.5–5.0)
Alkaline Phosphatase: 89 U/L (ref 38–126)
Anion gap: 10 (ref 5–15)
BUN: 29 mg/dL — ABNORMAL HIGH (ref 8–23)
CO2: 23 mmol/L (ref 22–32)
Calcium: 8.8 mg/dL — ABNORMAL LOW (ref 8.9–10.3)
Chloride: 104 mmol/L (ref 98–111)
Creatinine, Ser: 0.85 mg/dL (ref 0.44–1.00)
GFR calc Af Amer: >60 mL/min (ref >60)
GFR calc non Af Amer: >60 mL/min (ref >60)
Glucose, Bld: 130 mg/dL — ABNORMAL HIGH (ref 70–99)
Potassium: 4.4 mmol/L (ref 3.5–5.1)
Sodium: 137 mmol/L (ref 135–145)
Total Bilirubin: 0.5 mg/dL (ref 0.3–1.2)
Total Protein: 6.4 g/dL — ABNORMAL LOW (ref 6.5–8.1)

## 2020-04-08 LAB — C-REACTIVE PROTEIN: CRP: 3.4 mg/dL — ABNORMAL HIGH (ref <1.0)

## 2020-04-08 LAB — D-DIMER, QUANTITATIVE: D-Dimer, Quant: 5.78 ug/mL-FEU — ABNORMAL HIGH (ref 0.00–0.50)

## 2020-04-08 LAB — PROCALCITONIN: Procalcitonin: 0.16 ng/mL

## 2020-04-08 MED ORDER — PHENOL 1.4 % MT LIQD
1.0000 | OROMUCOSAL | Status: DC | PRN
  Administered 2020-04-27 – 2020-05-04 (×2): 1 via OROMUCOSAL
  Filled 2020-04-08: qty 177

## 2020-04-08 NOTE — TOC Initial Note (Signed)
Transition of Care [facility]) - Initial/Assessment Note    Patient Details  Name: Tiffany Coleman MRN: [id number] Date of Birth: Jan 10, 1956  [facility] CM/SW Contact:    Leeroy Cha, RN Phone Number: 04/08/2020, 7:59 AM  Clinical Narrative:                 Covid positive unvaccinated female with resp failure and pna, o2 at 15lvia hfnrbm,Iv solu medrol, Iv remdesivir. Plan is to return home, following for progression and toc needs.  Expected Discharge Plan: Home/Self Care Barriers to Discharge: Continued Medical Work up   Patient Goals and CMS Choice Patient states their goals for this hospitalization and ongoing recovery are:: to go home CMS Medicare.gov Compare Post Acute Care list provided to:: Patient    Expected Discharge Plan and Services Expected Discharge Plan: Home/Self Care   Discharge Planning Services: CM Consult   Living arrangements for the past 2 months: Apartment                                      Prior Living Arrangements/Services Living arrangements for the past 2 months: Apartment Lives with:: Self Patient language and need for interpreter reviewed:: Yes Do you feel safe going back to the place where you live?: Yes      Need for Family Participation in Patient Care: Yes (Comment) Care giver support system in place?: Yes (comment)   Criminal Activity/Legal Involvement Pertinent to Current Situation/Hospitalization: No - Comment as needed  Activities of Daily Living Home Assistive Devices/Equipment: Eyeglasses ADL Screening (condition at time of admission) Patient's cognitive ability adequate to safely complete daily activities?: No Is the patient deaf or have difficulty hearing?: No Does the patient have difficulty seeing, even when wearing glasses/contacts?: Yes (has been seeing yellow glares since getting the covid) Does the patient have difficulty concentrating, remembering, or making decisions?: Yes (some trouble  now) Patient able to express need for assistance with ADLs?: Yes Does the patient have difficulty dressing or bathing?: Yes Independently performs ADLs?: No Communication: Independent Dressing (OT): Needs assistance Is this a change from baseline?: Change from baseline, expected to last >3 days Grooming: Needs assistance Is this a change from baseline?: Change from baseline, expected to last >3 days Feeding: Independent Bathing: Needs assistance Is this a change from baseline?: Change from baseline, expected to last >3 days Toileting: Needs assistance Is this a change from baseline?: Change from baseline, expected to last >3days In/Out Bed: Needs assistance Is this a change from baseline?: Change from baseline, expected to last >3 days Walks in Home: Needs assistance Is this a change from baseline?: Change from baseline, expected to last >3 days Does the patient have difficulty walking or climbing stairs?: Yes Weakness of Legs: Both Weakness of Arms/Hands: Both  Permission Sought/Granted                  Emotional Assessment Appearance:: Appears stated age Attitude/Demeanor/Rapport: Engaged Affect (typically observed): Calm Orientation: : Oriented to Self, Oriented to Place, Oriented to  Time, Oriented to Situation Alcohol / Substance Use: Not Applicable Psych Involvement: No (comment)  Admission diagnosis:  Hypoxia [R09.02] Acute hypoxemic respiratory failure due to COVID-19 ([place]) [U07.1, J96.01] COVID-19 [U07.1] Patient Active Problem List   Diagnosis Date Noted  . Acute hypoxemic respiratory failure due to COVID-19 ([place]) 04/05/2020  . Elevated BP without diagnosis of hypertension 04/25/2019  . Post-viral cough syndrome  11/02/2017  . Teeth missing 05/30/2017  . GERD (gastroesophageal reflux disease) 05/30/2017  . Hypertension 03/08/2017  . Epigastric pain 03/06/2017  . Seasonal allergies 01/19/2017  . Morbid obesity ([place]) 01/19/2017  . Generalized anxiety  disorder 10/05/2016  . History of colonic polyps 06/03/2016  . Obstructive sleep apnea 06/02/2016  . Recurrent ventral hernia 09/08/2013   PCP:  Nolene Ebbs, MD Pharmacy:   [facility] [place], [place] [address] AT [place] [address] Phone: [phone] Fax: [phone]     Social Determinants of Health (SDOH) Interventions    Readmission Risk Interventions No flowsheet data found.

## 2020-04-08 NOTE — Progress Notes (Signed)
PROGRESS NOTE  Tiffany Coleman  VZS:[id number] DOB: 1956-03-27 DOA: 04/05/2020 PCP: Nolene Ebbs, MD   Brief Narrative: Tiffany Coleman is a 64 y.o. female with a history of morbid obesity, OSA not on CPAP, GAD, GERD, and recent covid-19 diagnosis at PCP office who presented to the ED 8/23 having been found passed out and in respiratory distress at home. In the ED, she was febrile, tachycardic, tachypneic 78% on 15L NRB with hypoxia confirmed pO2 27 on VBG. CTA chest was performed showing no PE but did reveal extensive bilateral parenchymal infiltrates consistent with covid-19 pneumonia. Remdesivir, steroids, and baricitinib started and the patient was admitted to medicine service after consultation with PCCM.  Assessment & Plan: Active Problems:   Obstructive sleep apnea   Generalized anxiety disorder   Morbid obesity ([place])   Hypertension   GERD (gastroesophageal reflux disease)   Acute hypoxemic respiratory failure due to COVID-19 [facility])   Acute hypoxemic respiratory failure due to covid-19 pneumonia:  - SARS-CoV-2 testing reportedly positive ~8/16 at PCP. - Continue remdesivir x5 days (8/23 - 8/27) - Continue steroids - methylprednisolone 28m IV q12h - Continue baricitinib, started 8/23, for 14 days (9/5) or until hospital discharge.  - Monitor procalcitonin - Encourage OOB, IS, FV, and awake proning if able - Continue airborne, contact precautions for 21 days from positive testing. - Enoxaparin intermediate dosing, CTA chest showed no PE. SpO2: 91 % O2 Flow Rate (L/min): 15 L/min FiO2 (%): 60 % Recent Labs    04/05/20 1443 04/05/20 1443 04/06/20 0537 04/07/20 0146 04/08/20 0157  DDIMER 4.17*   < > 4.59* 3.85* 5.78*  FERRITIN 852*  --  731*  --   --   LDH 632*  --   --   --   --   CRP 16.6*   < > 13.7* 7.5* 3.4*   < > = values in this interval not displayed.   History of asthma: No current wheezing.  - Continue BDs and steroids.   OSA: Pt not on  CPAP at home.  - Bipap HS per discussion with PCCM  LFT elevation: Likely due to viral illness. Hepatic Function Latest Ref Rng & Units 04/08/2020 04/07/2020 04/06/2020  Total Protein 6.5 - 8.1 g/dL 6.4(L) 6.1(L) 6.5  Albumin 3.5 - 5.0 g/dL 2.8(L) 2.6(L) 2.7(L)  AST 15 - 41 U/L 88(H) 71(H) 80(H)  ALT 0 - 44 U/L 108(H) 86(H) 93(H)  Alk Phosphatase 38 - 126 U/L 89 69 59  Total Bilirubin 0.3 - 1.2 mg/dL 0.5 0.5 0.4   AKI, without history of CKD, poa:  - Improving. Continue monitoring Lab Results  Component Value Date   CREATININE 0.85 04/08/2020   CREATININE 0.90 04/07/2020   CREATININE 0.96 04/06/2020      Obesity: Estimated body mass index is 38.62 kg/m as calculated from the following:   Height as of this encounter: _0  (1.6 m).   Weight as of this encounter: 98.9 kg.  DVT prophylaxis: Lovenox Code Status: Full Family Communication: Daughter by phone Disposition Plan:   Status is: Inpatient Remains inpatient appropriate because:Inpatient level of care appropriate due to severity of illness  Dispo: The patient is from: Home               Anticipated d/c is to: TBD, based on clinical trajectory               Anticipated d/c date is: > 3 days  Patient currently is not medically stable to d/c.  Consultants:   None  Procedures:   None  Antimicrobials:  Remdesivir   Subjective: No acute issues/events overnight -ongoing dyspnea with minimal exertion but clinically improving, she does not wish to continue to use BiPAP during the day but is willing to use it at night. Denies nausea, vomiting, diarrhea, constipation, headache, fevers, chills.  Objective: Vitals:   04/08/20 0300 04/08/20 0339 04/08/20 0400 04/08/20 0500  BP: (!) 152/93  (!) 149/88 (!) 161/90  Pulse: 74  72 76  Resp: (!) 28  (!) 30 (!) 30  Temp:  98.7 F (37.1 C)    TempSrc:  Axillary    SpO2: 91%  94% 91%  Weight:      Height:        Intake/Output Summary (Last 24 hours) at  04/08/2020 0721 Last data filed at 04/08/2020 0500 Gross per 24 hour  Intake 578.82 ml  Output 750 ml  Net -171.18 ml   Filed Weights   04/05/20 1419 04/07/20 1000  Weight: 104.3 kg 98.9 kg    Gen: 64 y.o. female in no acute distress Pulm: Normal effort, rate ~24/min, crackles diffusely. CV: Regular rate and rhythm. No murmur, rub, or gallop. No JVD, no pedal edema. GI: Abdomen soft, non-tender, non-distended, with normoactive bowel sounds. No organomegaly or masses felt. Ext: Warm, no deformities Skin: No rashes, lesions or ulcers Neuro: Alert and oriented. No focal neurological deficits. Psych: Judgement and insight appear normal. Mood & affect appropriate.   Data Reviewed: I have personally reviewed following labs and imaging studies  CBC: Recent Labs  Lab 04/05/20 1443 04/06/20 0537 04/07/20 0146 04/08/20 0157  WBC 7.3 5.5 4.8 9.3  NEUTROABS 6.2 4.4 3.8 8.0*  HGB 12.8 12.3 11.5* 11.7*  HCT 40.3 39.1 38.0 37.8  MCV 86.5 87.3 87.8 86.3  PLT 196 201 208 726   Basic Metabolic Panel: Recent Labs  Lab 04/05/20 1443 04/06/20 0537 04/07/20 0146 04/08/20 0157  NA 135 134* 136 137  K 3.9 4.0 3.9 4.4  CL 99 102 104 104  CO2 _0 GLUCOSE 117* 130* 166* 130*  BUN _1 29*  CREATININE 1.21* 0.96 0.90 0.85  CALCIUM 8.5* 7.7* 8.3* 8.8*  MG  --  2.3  --   --   PHOS  --  2.9  --   --    GFR: Estimated Creatinine Clearance: 73 mL/min (by C-G formula based on SCr of 0.85 mg/dL). Liver Function Tests: Recent Labs  Lab 04/05/20 1443 04/06/20 0537 04/07/20 0146 04/08/20 0157  AST 97* 80* 71* 88*  ALT 116* 93* 86* 108*  ALKPHOS 52 59 69 89  BILITOT 0.7 0.4 0.5 0.5  PROT 6.8 6.5 6.1* 6.4*  ALBUMIN 3.1* 2.7* 2.6* 2.8*   No results for input(s): LIPASE, AMYLASE in the last 168 hours. No results for input(s): AMMONIA in the last 168 hours. Coagulation Profile: No results for input(s): INR, PROTIME in the last 168 hours. Cardiac Enzymes: No results for  input(s): CKTOTAL, CKMB, CKMBINDEX, TROPONINI in the last 168 hours. BNP (last 3 results) No results for input(s): PROBNP in the last 8760 hours. HbA1C: No results for input(s): HGBA1C in the last 72 hours. CBG: Recent Labs  Lab 04/07/20 1205 04/07/20 1555 04/07/20 1954 04/07/20 2337 04/08/20 0338  GLUCAP 162* 145* 157* 136* 126*   Lipid Profile: Recent Labs    04/05/20 1443  TRIG 148   Thyroid Function Tests: No results  for input(s): TSH, T4TOTAL, FREET4, T3FREE, THYROIDAB in the last 72 hours. Anemia Panel: Recent Labs    04/05/20 1443 04/06/20 0537  FERRITIN 852* 731*   Urine analysis:    Component Value Date/Time   COLORURINE YELLOW 07/25/2019 [place] 07/25/2019 1343   APPEARANCEUR Cloudy (A) 04/25/2019 1132   LABSPEC 1.018 07/25/2019 1343   PHURINE 5.0 07/25/2019 1343   GLUCOSEU NEGATIVE 07/25/2019 1343   HGBUR NEGATIVE 07/25/2019 1343   BILIRUBINUR NEGATIVE 07/25/2019 1343   BILIRUBINUR Negative 04/25/2019 1132   KETONESUR NEGATIVE 07/25/2019 1343   PROTEINUR NEGATIVE 07/25/2019 1343   NITRITE NEGATIVE 07/25/2019 1343   LEUKOCYTESUR NEGATIVE 07/25/2019 1343   Recent Results (from the past 240 hour(s))  Blood Culture (routine x 2)     Status: None (Preliminary result)   Collection Time: 04/05/20  2:30 PM   Specimen: BLOOD LEFT FOREARM  Result Value Ref Range Status   Specimen Description   Final    BLOOD LEFT FOREARM Performed at [facility], [place] [address] [place], [place] [address]    Special Requests   Final    BOTTLES DRAWN AEROBIC AND ANAEROBIC Blood Culture adequate volume Performed at [place] [address], [place], [place] [address]    Culture   Final    NO GROWTH 2 DAYS Performed at [place] [address], [place], [place] [address]    Report Status PENDING  Incomplete  SARS Coronavirus 2 by RT PCR (hospital order, performed in [facility] hospital lab)  Nasopharyngeal Nasopharyngeal Swab     Status: Abnormal   Collection Time: 04/05/20  2:43 PM   Specimen: Nasopharyngeal Swab  Result Value Ref Range Status   SARS Coronavirus 2 POSITIVE (A) NEGATIVE Final    Comment: RESULT CALLED TO, READ BACK BY AND VERIFIED WITH: ZULETA, K. RN _0  04/05/20 BILLINGSLEY,L (NOTE) SARS-CoV-2 target nucleic acids are DETECTED  SARS-CoV-2 RNA is generally detectable in upper respiratory specimens  during the acute phase of infection.  Positive results are indicative  of the presence of the identified virus, but do not rule out bacterial infection or co-infection with other pathogens not detected by the test.  Clinical correlation with patient history and  other diagnostic information is necessary to determine patient infection status.  The expected result is negative.  Fact Sheet for Patients:   StrictlyIdeas.no   Fact Sheet for Healthcare Providers:   BankingDealers.co.za    This test is not yet approved or cleared by the [place] FDA and  has been authorized for detection and/or diagnosis of SARS-CoV-2 by FDA under an Emergency Use Authorization (EUA).  This EUA will remain in effect (meaning  this test can be used) for the duration of  the COVID-19 declaration under Section 564(b)(1) of the Act, 21 U.S.C. section 360-bbb-3(b)(1), unless the authorization is terminated or revoked sooner.  Performed at [facility], [place] [address], [place], [place] [address]   Blood Culture (routine x 2)     Status: None (Preliminary result)   Collection Time: 04/05/20  2:43 PM   Specimen: BLOOD  Result Value Ref Range Status   Specimen Description   Final    BLOOD LEFT ANTECUBITAL Performed at [place] [address] [place],  [address]    Special Requests   Final    BOTTLES DRAWN AEROBIC AND ANAEROBIC Blood Culture results may not be optimal due to an  inadequate volume of blood  received in culture bottles Performed at [facility], [place] [address] [place], [place] [address]    Culture   Final    NO GROWTH 2 DAYS Performed at [place] [address] [place], [place] [address]    Report Status PENDING  Incomplete  MRSA PCR Screening     Status: None   Collection Time: 04/06/20  5:22 PM   Specimen: Nasal Mucosa; Nasopharyngeal  Result Value Ref Range Status   MRSA by PCR NEGATIVE NEGATIVE Final    Comment:        The GeneXpert MRSA Assay (FDA approved for NASAL specimens only), is one component of a comprehensive MRSA colonization surveillance program. It is not intended to diagnose MRSA infection nor to guide or monitor treatment for MRSA infections. Performed at [facility], [place] [address], [place], [place] [address]       Radiology Studies: No results found.  Scheduled Meds: . (feeding supplement) PROSource Plus  30 mL Oral BID BM  . vitamin C  500 mg Oral Daily  . baricitinib  4 mg Oral Daily  . Chlorhexidine Gluconate Cloth  6 each Topical Daily  . enoxaparin (LOVENOX) injection  50 mg Subcutaneous Q12H  . feeding supplement (KATE FARMS STANDARD 1.4)  325 mL Oral Daily  . Ipratropium-Albuterol  1 puff Inhalation Q6H  . mouth rinse  15 mL Mouth Rinse BID  . methylPREDNISolone (SOLU-MEDROL) injection  80 mg Intravenous BID  . multivitamin with minerals  1 tablet Oral Daily  . oxymetazoline  1 spray Each Nare BID  . pantoprazole  40 mg Oral Daily  . zinc sulfate  220 mg Oral Daily   Continuous Infusions: . remdesivir 100 mg in NS 100 mL Stopped (04/07/20 0952)    LOS: 3 days   Time spent: 35 minutes  Little Ishikawa, DO [facility] Secure chat for pager 04/08/2020, 7:21 AM

## 2020-04-08 NOTE — Plan of Care (Signed)
Discussed with patient plan of care for the evening, pain management and importance of prone or side lying to stomach as much as possible with some teach back displayed

## 2020-04-08 NOTE — Progress Notes (Signed)
Pt has declined the use of BiPAP qhs for tonight.  Pt states that she is unable to sleep with it on and really needs to rest tonight.  Pt has agreed to sleep on her side as close to self proning as she can tolerate.  I spoke with Pt and encouraged the use of BiPAP even for a short while but she still refused.  Pt agreeable to re-evaluate her BiPAP decision should her oxygenation status change.

## 2020-04-09 LAB — COMPREHENSIVE METABOLIC PANEL
ALT: 96 U/L — ABNORMAL HIGH (ref 0–44)
AST: 64 U/L — ABNORMAL HIGH (ref 15–41)
Albumin: 2.8 g/dL — ABNORMAL LOW (ref 3.5–5.0)
Alkaline Phosphatase: 105 U/L (ref 38–126)
Anion gap: 9 (ref 5–15)
BUN: 23 mg/dL (ref 8–23)
CO2: 25 mmol/L (ref 22–32)
Calcium: 8.7 mg/dL — ABNORMAL LOW (ref 8.9–10.3)
Chloride: 104 mmol/L (ref 98–111)
Creatinine, Ser: 0.84 mg/dL (ref 0.44–1.00)
GFR calc Af Amer: >60 mL/min (ref >60)
GFR calc non Af Amer: >60 mL/min (ref >60)
Glucose, Bld: 142 mg/dL — ABNORMAL HIGH (ref 70–99)
Potassium: 4.5 mmol/L (ref 3.5–5.1)
Sodium: 138 mmol/L (ref 135–145)
Total Bilirubin: 0.7 mg/dL (ref 0.3–1.2)
Total Protein: 6.2 g/dL — ABNORMAL LOW (ref 6.5–8.1)

## 2020-04-09 LAB — GLUCOSE, CAPILLARY
Glucose-Capillary: 106 mg/dL — ABNORMAL HIGH (ref 70–99)
Glucose-Capillary: 115 mg/dL — ABNORMAL HIGH (ref 70–99)
Glucose-Capillary: 116 mg/dL — ABNORMAL HIGH (ref 70–99)
Glucose-Capillary: 126 mg/dL — ABNORMAL HIGH (ref 70–99)
Glucose-Capillary: 140 mg/dL — ABNORMAL HIGH (ref 70–99)
Glucose-Capillary: 158 mg/dL — ABNORMAL HIGH (ref 70–99)

## 2020-04-09 LAB — CBC WITH DIFFERENTIAL/PLATELET
Abs Immature Granulocytes: 0.19 10*3/uL — ABNORMAL HIGH (ref 0.00–0.07)
Basophils Absolute: 0 10*3/uL (ref 0.0–0.1)
Basophils Relative: 0 %
Eosinophils Absolute: 0 10*3/uL (ref 0.0–0.5)
Eosinophils Relative: 0 %
HCT: 37.4 % (ref 36.0–46.0)
Hemoglobin: 11.7 g/dL — ABNORMAL LOW (ref 12.0–15.0)
Immature Granulocytes: 2 %
Lymphocytes Relative: 4 %
Lymphs Abs: 0.4 10*3/uL — ABNORMAL LOW (ref 0.7–4.0)
MCH: 26.9 pg (ref 26.0–34.0)
MCHC: 31.3 g/dL (ref 30.0–36.0)
MCV: 86 fL (ref 80.0–100.0)
Monocytes Absolute: 0.5 10*3/uL (ref 0.1–1.0)
Monocytes Relative: 5 %
Neutro Abs: 8.9 10*3/uL — ABNORMAL HIGH (ref 1.7–7.7)
Neutrophils Relative %: 89 %
Platelets: 220 10*3/uL (ref 150–400)
RBC: 4.35 MIL/uL (ref 3.87–5.11)
RDW: 14.2 % (ref 11.5–15.5)
WBC: 10 10*3/uL (ref 4.0–10.5)
nRBC: 0.2 % (ref 0.0–0.2)

## 2020-04-09 LAB — D-DIMER, QUANTITATIVE: D-Dimer, Quant: 8.12 ug/mL-FEU — ABNORMAL HIGH (ref 0.00–0.50)

## 2020-04-09 LAB — C-REACTIVE PROTEIN: CRP: 3.2 mg/dL — ABNORMAL HIGH (ref <1.0)

## 2020-04-09 MED ORDER — DM-GUAIFENESIN ER 30-600 MG PO TB12
1.0000 | ORAL_TABLET | Freq: Two times a day (BID) | ORAL | Status: DC
  Administered 2020-04-09 – 2020-05-06 (×55): 1 via ORAL
  Filled 2020-04-09: qty 2
  Filled 2020-04-09 (×35): qty 1
  Filled 2020-04-09: qty 2
  Filled 2020-04-09 (×17): qty 1

## 2020-04-09 NOTE — Progress Notes (Signed)
PROGRESS NOTE  Tiffany Coleman  JSH:[id number] DOB: 1956/05/15 DOA: 04/05/2020 PCP: Nolene Ebbs, MD   Brief Narrative: Tiffany Coleman is a 64 y.o. female with a history of morbid obesity, OSA not on CPAP, GAD, GERD, and recent covid-19 diagnosis at PCP office who presented to the ED 8/23 having been found passed out and in respiratory distress at home. In the ED, she was febrile, tachycardic, tachypneic 78% on 15L NRB with hypoxia confirmed pO2 27 on VBG. CTA chest was performed showing no PE but did reveal extensive bilateral parenchymal infiltrates consistent with covid-19 pneumonia. Remdesivir, steroids, and baricitinib started and the patient was admitted to medicine service after consultation with PCCM.  Assessment & Plan: Active Problems:   Obstructive sleep apnea   Generalized anxiety disorder   Morbid obesity ([place])   Hypertension   GERD (gastroesophageal reflux disease)   Acute hypoxemic respiratory failure due to COVID-19 [facility])   Acute hypoxemic respiratory failure due to covid-19 pneumonia:  - SARS-CoV-2 testing reportedly positive ~8/16 at PCP. - Continue remdesivir x5 days (8/23 - 8/27) - Continue steroids - methylprednisolone 71m IV q12h - Continue baricitinib, started 8/23, for 14 days (9/5) or until hospital discharge.  - Monitor procalcitonin - Encourage OOB, IS, FV, and awake proning if able - Continue airborne, contact precautions for 21 days from positive testing. - Enoxaparin intermediate dosing, CTA chest showed no PE. SpO2: (!) 88 % O2 Flow Rate (L/min): 15 L/min FiO2 (%): 100 % Recent Labs    04/07/20 0146 04/08/20 0157 04/09/20 0236  DDIMER 3.85* 5.78* 8.12*  CRP 7.5* 3.4* 3.2*   History of asthma: No current wheezing.  - Continue BDs and steroids.   OSA: Pt not on CPAP at home.  - Bipap HS and PRN per discussion with PCCM - patient non compliant; lengthy discussion about ongoing needs especially during COVID infection  LFT  elevation: Likely due to viral illness. Hepatic Function Latest Ref Rng & Units 04/09/2020 04/08/2020 04/07/2020  Total Protein 6.5 - 8.1 g/dL 6.2(L) 6.4(L) 6.1(L)  Albumin 3.5 - 5.0 g/dL 2.8(L) 2.8(L) 2.6(L)  AST 15 - 41 U/L 64(H) 88(H) 71(H)  ALT 0 - 44 U/L 96(H) 108(H) 86(H)  Alk Phosphatase 38 - 126 U/L 105 89 69  Total Bilirubin 0.3 - 1.2 mg/dL 0.7 0.5 0.5    AKI, without history of CKD, poa:  - Improving. Continue monitoring Lab Results  Component Value Date   CREATININE 0.84 04/09/2020   CREATININE 0.85 04/08/2020   CREATININE 0.90 04/07/2020      Obesity: Estimated body mass index is 38.7 kg/m as calculated from the following:   Height as of this encounter: _0  (1.6 m).   Weight as of this encounter: 99.1 kg.  DVT prophylaxis: Lovenox Code Status: Full Family Communication: Daughter by phone Disposition Plan:   Status is: Inpatient Remains inpatient appropriate because:Inpatient level of care appropriate due to severity of illness  Dispo: The patient is from: Home               Anticipated d/c is to: TBD, based on clinical trajectory               Anticipated d/c date is: > 3 days               Patient currently is not medically stable to d/c.  Consultants:   None  Procedures:   None  Antimicrobials:  Remdesivir   Subjective: No acute issues/events overnight - ongoing dyspnea with  minimal exertion but clinically improving, she continues to refuse bipap even at night now. Denies nausea, vomiting, diarrhea, constipation, headache, fevers, chills.  Objective: Vitals:   04/09/20 1000 04/09/20 1100 04/09/20 1200 04/09/20 1204  BP: (!) 166/89     Pulse: 93 100 88   Resp: (!) 30 (!) 28 (!) 29   Temp:    98.6 F (37 C)  TempSrc:    Oral  SpO2:  91% (!) 88%   Weight:      Height:        Intake/Output Summary (Last 24 hours) at 04/09/2020 1335 Last data filed at 04/09/2020 1000 Gross per 24 hour  Intake 795.33 ml  Output 650 ml  Net 145.33 ml   Filed  Weights   04/05/20 1419 04/07/20 1000 04/09/20 0550  Weight: 104.3 kg 98.9 kg 99.1 kg    Gen: 64 y.o. female in no acute distress Pulm: Normal effort, without distress, diminished breath sounds bilaterally CV: Regular rate and rhythm. No murmur, rub, or gallop. No JVD, no pedal edema. GI: Abdomen soft, non-tender, non-distended, with normoactive bowel sounds. No organomegaly or masses felt. Ext: Warm, no deformities Skin: No rashes, lesions or ulcers Neuro: Alert and oriented. No focal neurological deficits. Psych: Judgement and insight appear normal. Mood & affect appropriate.   Data Reviewed: I have personally reviewed following labs and imaging studies  CBC: Recent Labs  Lab 04/05/20 1443 04/06/20 0537 04/07/20 0146 04/08/20 0157 04/09/20 0236  WBC 7.3 5.5 4.8 9.3 10.0  NEUTROABS 6.2 4.4 3.8 8.0* 8.9*  HGB 12.8 12.3 11.5* 11.7* 11.7*  HCT 40.3 39.1 38.0 37.8 37.4  MCV 86.5 87.3 87.8 86.3 86.0  PLT 196 201 208 233 488   Basic Metabolic Panel: Recent Labs  Lab 04/05/20 1443 04/06/20 0537 04/07/20 0146 04/08/20 0157 04/09/20 0236  NA 135 134* 136 137 138  K 3.9 4.0 3.9 4.4 4.5  CL 99 102 104 104 104  CO2 _0 GLUCOSE 117* 130* 166* 130* 142*  BUN _1 29* 23  CREATININE 1.21* 0.96 0.90 0.85 0.84  CALCIUM 8.5* 7.7* 8.3* 8.8* 8.7*  MG  --  2.3  --   --   --   PHOS  --  2.9  --   --   --    GFR: Estimated Creatinine Clearance: 73.9 mL/min (by C-G formula based on SCr of 0.84 mg/dL). Liver Function Tests: Recent Labs  Lab 04/05/20 1443 04/06/20 0537 04/07/20 0146 04/08/20 0157 04/09/20 0236  AST 97* 80* 71* 88* 64*  ALT 116* 93* 86* 108* 96*  ALKPHOS 52 59 69 89 105  BILITOT 0.7 0.4 0.5 0.5 0.7  PROT 6.8 6.5 6.1* 6.4* 6.2*  ALBUMIN 3.1* 2.7* 2.6* 2.8* 2.8*   No results for input(s): LIPASE, AMYLASE in the last 168 hours. No results for input(s): AMMONIA in the last 168 hours. Coagulation Profile: No results for input(s): INR, PROTIME in  the last 168 hours. Cardiac Enzymes: No results for input(s): CKTOTAL, CKMB, CKMBINDEX, TROPONINI in the last 168 hours. BNP (last 3 results) No results for input(s): PROBNP in the last 8760 hours. HbA1C: No results for input(s): HGBA1C in the last 72 hours. CBG: Recent Labs  Lab 04/08/20 1915 04/08/20 2316 04/09/20 0417 04/09/20 0855 04/09/20 1148  GLUCAP 178* 109* 158* 115* 126*   Lipid Profile: No results for input(s): CHOL, HDL, LDLCALC, TRIG, CHOLHDL, LDLDIRECT in the last 72 hours. Thyroid Function Tests: No results for input(s): TSH,  T4TOTAL, FREET4, T3FREE, THYROIDAB in the last 72 hours. Anemia Panel: No results for input(s): VITAMINB12, FOLATE, FERRITIN, TIBC, IRON, RETICCTPCT in the last 72 hours. Urine analysis:    Component Value Date/Time   COLORURINE YELLOW 07/25/2019 1343   APPEARANCEUR CLEAR 07/25/2019 1343   APPEARANCEUR Cloudy (A) 04/25/2019 1132   LABSPEC 1.018 07/25/2019 1343   PHURINE 5.0 07/25/2019 1343   GLUCOSEU NEGATIVE 07/25/2019 1343   HGBUR NEGATIVE 07/25/2019 1343   BILIRUBINUR NEGATIVE 07/25/2019 1343   BILIRUBINUR Negative 04/25/2019 1132   KETONESUR NEGATIVE 07/25/2019 1343   PROTEINUR NEGATIVE 07/25/2019 1343   NITRITE NEGATIVE 07/25/2019 1343   LEUKOCYTESUR NEGATIVE 07/25/2019 1343   Recent Results (from the past 240 hour(s))  Blood Culture (routine x 2)     Status: None (Preliminary result)   Collection Time: 04/05/20  2:30 PM   Specimen: BLOOD LEFT FOREARM  Result Value Ref Range Status   Specimen Description   Final    BLOOD LEFT FOREARM Performed at [facility], [place] [address] [place], [place] [address]    Special Requests   Final    BOTTLES DRAWN AEROBIC AND ANAEROBIC Blood Culture adequate volume Performed at [place] [address] [place], [place] [address]    Culture   Final    NO GROWTH 4 DAYS Performed at [facility], [place] [address] [place], [place] [address]     Report Status PENDING  Incomplete  SARS Coronavirus 2 by RT PCR (hospital order, performed in [facility] hospital lab) Nasopharyngeal Nasopharyngeal Swab     Status: Abnormal   Collection Time: 04/05/20  2:43 PM   Specimen: Nasopharyngeal Swab  Result Value Ref Range Status   SARS Coronavirus 2 POSITIVE (A) NEGATIVE Final    Comment: RESULT CALLED TO, READ BACK BY AND VERIFIED WITH: ZULETA, K. RN _0  04/05/20 BILLINGSLEY,L (NOTE) SARS-CoV-2 target nucleic acids are DETECTED  SARS-CoV-2 RNA is generally detectable in upper respiratory specimens  during the acute phase of infection.  Positive results are indicative  of the presence of the identified virus, but do not rule out bacterial infection or co-infection with other pathogens not detected by the test.  Clinical correlation with patient history and  other diagnostic information is necessary to determine patient infection status.  The expected result is negative.  Fact Sheet for Patients:   StrictlyIdeas.no   Fact Sheet for Healthcare Providers:   BankingDealers.co.za    This test is not yet approved or cleared by the [place] FDA and  has been authorized for detection and/or diagnosis of SARS-CoV-2 by FDA under an Emergency Use Authorization (EUA).  This EUA will remain in effect (meaning  this test can be used) for the duration of  the COVID-19 declaration under Section 564(b)(1) of the Act, 21 U.S.C. section 360-bbb-3(b)(1), unless the authorization is terminated or revoked sooner.  Performed at [facility], [place] [address], [place], [place] [address]   Blood Culture (routine x 2)     Status: None (Preliminary result)   Collection Time: 04/05/20  2:43 PM   Specimen: BLOOD  Result Value Ref Range Status   Specimen Description   Final    BLOOD LEFT ANTECUBITAL Performed at [place] [address] , [place] [address]      Special Requests   Final    BOTTLES DRAWN AEROBIC AND ANAEROBIC Blood Culture results may not be optimal due to an inadequate volume of blood received in  culture bottles Performed at [place] [address], [place], [place] [address]    Culture   Final    NO GROWTH 4 DAYS Performed at [facility], [place] [address], [place], [place] [address]    Report Status PENDING  Incomplete  MRSA PCR Screening     Status: None   Collection Time: 04/06/20  5:22 PM   Specimen: Nasal Mucosa; Nasopharyngeal  Result Value Ref Range Status   MRSA by PCR NEGATIVE NEGATIVE Final    Comment:        The GeneXpert MRSA Assay (FDA approved for NASAL specimens only), is one component of a comprehensive MRSA colonization surveillance program. It is not intended to diagnose MRSA infection nor to guide or monitor treatment for MRSA infections. Performed at [facility], [place] [address] [place],  [address]       Radiology Studies: No results found.  Scheduled Meds: . (feeding supplement) PROSource Plus  30 mL Oral BID BM  . vitamin C  500 mg Oral Daily  . baricitinib  4 mg Oral Daily  . Chlorhexidine Gluconate Cloth  6 each Topical Daily  . dextromethorphan-guaiFENesin  1 tablet Oral BID  . enoxaparin (LOVENOX) injection  50 mg Subcutaneous Q12H  . feeding supplement (KATE FARMS STANDARD 1.4)  325 mL Oral Daily  . Ipratropium-Albuterol  1 puff Inhalation Q6H  . mouth rinse  15 mL Mouth Rinse BID  . methylPREDNISolone (SOLU-MEDROL) injection  80 mg Intravenous BID  . multivitamin with minerals  1 tablet Oral Daily  . oxymetazoline  1 spray Each Nare BID  . pantoprazole  40 mg Oral Daily  . zinc sulfate  220 mg Oral Daily   Continuous Infusions:   LOS: 4 days   Time spent: 35 minutes  Little Ishikawa, DO [facility] Secure chat for pager 04/09/2020, 1:35 PM

## 2020-04-09 NOTE — Plan of Care (Signed)
Discussed with patient plan of care for the evening, pain management and wanting to go to bed around 2115 with some teach back displayed.  Patient will try lay on side to stomach as much as possible tonight.

## 2020-04-10 LAB — CBC WITH DIFFERENTIAL/PLATELET
Abs Immature Granulocytes: 0.19 10*3/uL — ABNORMAL HIGH (ref 0.00–0.07)
Basophils Absolute: 0 10*3/uL (ref 0.0–0.1)
Basophils Relative: 0 %
Eosinophils Absolute: 0 10*3/uL (ref 0.0–0.5)
Eosinophils Relative: 0 %
HCT: 36.3 % (ref 36.0–46.0)
Hemoglobin: 11.7 g/dL — ABNORMAL LOW (ref 12.0–15.0)
Immature Granulocytes: 2 %
Lymphocytes Relative: 4 %
Lymphs Abs: 0.5 10*3/uL — ABNORMAL LOW (ref 0.7–4.0)
MCH: 27.1 pg (ref 26.0–34.0)
MCHC: 32.2 g/dL (ref 30.0–36.0)
MCV: 84.2 fL (ref 80.0–100.0)
Monocytes Absolute: 0.4 10*3/uL (ref 0.1–1.0)
Monocytes Relative: 3 %
Neutro Abs: 10.1 10*3/uL — ABNORMAL HIGH (ref 1.7–7.7)
Neutrophils Relative %: 91 %
Platelets: 230 10*3/uL (ref 150–400)
RBC: 4.31 MIL/uL (ref 3.87–5.11)
RDW: 14.2 % (ref 11.5–15.5)
WBC: 11.2 10*3/uL — ABNORMAL HIGH (ref 4.0–10.5)
nRBC: 0 % (ref 0.0–0.2)

## 2020-04-10 LAB — COMPREHENSIVE METABOLIC PANEL
ALT: 80 U/L — ABNORMAL HIGH (ref 0–44)
AST: 44 U/L — ABNORMAL HIGH (ref 15–41)
Albumin: 2.7 g/dL — ABNORMAL LOW (ref 3.5–5.0)
Alkaline Phosphatase: 97 U/L (ref 38–126)
Anion gap: 10 (ref 5–15)
BUN: 24 mg/dL — ABNORMAL HIGH (ref 8–23)
CO2: 25 mmol/L (ref 22–32)
Calcium: 8.8 mg/dL — ABNORMAL LOW (ref 8.9–10.3)
Chloride: 101 mmol/L (ref 98–111)
Creatinine, Ser: 0.83 mg/dL (ref 0.44–1.00)
GFR calc Af Amer: >60 mL/min (ref >60)
GFR calc non Af Amer: >60 mL/min (ref >60)
Glucose, Bld: 125 mg/dL — ABNORMAL HIGH (ref 70–99)
Potassium: 4.4 mmol/L (ref 3.5–5.1)
Sodium: 136 mmol/L (ref 135–145)
Total Bilirubin: 0.6 mg/dL (ref 0.3–1.2)
Total Protein: 6.4 g/dL — ABNORMAL LOW (ref 6.5–8.1)

## 2020-04-10 LAB — D-DIMER, QUANTITATIVE: D-Dimer, Quant: 6.78 ug/mL-FEU — ABNORMAL HIGH (ref 0.00–0.50)

## 2020-04-10 LAB — CULTURE, BLOOD (ROUTINE X 2)
Culture: NO GROWTH
Culture: NO GROWTH
Special Requests: ADEQUATE

## 2020-04-10 LAB — C-REACTIVE PROTEIN: CRP: 7.6 mg/dL — ABNORMAL HIGH (ref <1.0)

## 2020-04-10 LAB — GLUCOSE, CAPILLARY
Glucose-Capillary: 137 mg/dL — ABNORMAL HIGH (ref 70–99)
Glucose-Capillary: 136 mg/dL — ABNORMAL HIGH (ref 70–99)
Glucose-Capillary: 130 mg/dL — ABNORMAL HIGH (ref 70–99)
Glucose-Capillary: 139 mg/dL — ABNORMAL HIGH (ref 70–99)

## 2020-04-10 MED ORDER — HYDRALAZINE HCL 20 MG/ML IJ SOLN
10.0000 mg | Freq: Once | INTRAMUSCULAR | Status: AC
  Administered 2020-04-10: 10 mg via INTRAVENOUS
  Filled 2020-04-10: qty 1

## 2020-04-10 NOTE — Progress Notes (Signed)
PCCM recommended placing pt on bipap during the day due to pt refusing at night. RT attempted but pt is alert and oriented and refusing at this time, discussing bipap with pt made her very anxious resulting in SATs to drop into mid 60s for several minutes. RN notified for need for something for anxiety. PNRB placed temporarily to assist in recovery of SATs.

## 2020-04-10 NOTE — Progress Notes (Signed)
PROGRESS NOTE  Tiffany Coleman  BEM:[id number] DOB: 1956/03/19 DOA: 04/05/2020 PCP: Nolene Ebbs, MD   Brief Narrative: Tiffany Coleman is a 64 y.o. female with a history of morbid obesity, OSA not on CPAP, GAD, GERD, and recent covid-19 diagnosis at PCP office who presented to the ED 8/23 having been found passed out and in respiratory distress at home. In the ED, she was febrile, tachycardic, tachypneic 78% on 15L NRB with hypoxia confirmed pO2 27 on VBG. CTA chest was performed showing no PE but did reveal extensive bilateral parenchymal infiltrates consistent with covid-19 pneumonia. Remdesivir, steroids, and baricitinib started and the patient was admitted to medicine service after consultation with PCCM.  Assessment & Plan: Active Problems:   Obstructive sleep apnea   Generalized anxiety disorder   Morbid obesity ([place])   Hypertension   GERD (gastroesophageal reflux disease)   Acute hypoxemic respiratory failure due to COVID-19 [facility])   Acute hypoxemic respiratory failure due to covid-19 pneumonia:  - SARS-CoV-2 testing reportedly positive ~8/16 at PCP. - Continue remdesivir x5 days (8/23 - 8/27) - Continue steroids - methylprednisolone 82m IV q12h - Continue baricitinib, started 8/23, for 14 days (9/5) or until hospital discharge.  - Monitor procalcitonin - Encourage OOB, IS, FV, and awake proning if able - Continue airborne, contact precautions for 21 days from positive testing. - Enoxaparin intermediate dosing, CTA chest showed no PE. SpO2: 98 % O2 Flow Rate (L/min): 13 L/min FiO2 (%): 94 % Recent Labs    04/08/20 0157 04/09/20 0236 04/10/20 0913  DDIMER 5.78* 8.12* 6.78*  CRP 3.4* 3.2* 7.6*   History of asthma: No current wheezing.  - Continue BDs and steroids.   OSA: Pt not on CPAP at home.  - Bipap HS and PRN per discussion with PCCM - patient non compliant; lengthy discussion about ongoing needs especially during COVID infection  LFT  elevation: Likely due to viral illness. Hepatic Function Latest Ref Rng & Units 04/10/2020 04/09/2020 04/08/2020  Total Protein 6.5 - 8.1 g/dL 6.4(L) 6.2(L) 6.4(L)  Albumin 3.5 - 5.0 g/dL 2.7(L) 2.8(L) 2.8(L)  AST 15 - 41 U/L 44(H) 64(H) 88(H)  ALT 0 - 44 U/L 80(H) 96(H) 108(H)  Alk Phosphatase 38 - 126 U/L 97 105 89  Total Bilirubin 0.3 - 1.2 mg/dL 0.6 0.7 0.5    AKI, without history of CKD, poa:  - Improving. Continue monitoring Lab Results  Component Value Date   CREATININE 0.83 04/10/2020   CREATININE 0.84 04/09/2020   CREATININE 0.85 04/08/2020      Obesity: Estimated body mass index is 38.7 kg/m as calculated from the following:   Height as of this encounter: _0  (1.6 m).   Weight as of this encounter: 99.1 kg.  DVT prophylaxis: Lovenox Code Status: Full Family Communication: Daughter updated previously by phone Disposition Plan:   Status is: Inpatient Remains inpatient appropriate because:Inpatient level of care appropriate due to severity of illness  Dispo: The patient is from: Home               Anticipated d/c is to: TBD, based on clinical trajectory               Anticipated d/c date is: > 3 days               Patient currently is not medically stable to d/c.  Consultants:   None  Procedures:   None  Antimicrobials:  Remdesivir   Subjective: No acute issues/events overnight - ongoing dyspnea  with minimal exertion but clinically improving, she continues to refuse bipap despite discussion. Denies nausea, vomiting, diarrhea, constipation, headache, fevers, chills.  Objective: Vitals:   04/10/20 0954 04/10/20 1000 04/10/20 1100 04/10/20 1200  BP:  134/69 (!) 154/79   Pulse: (!) 105 (!) 107 (!) 106   Resp: (!) 28 (!) 24 (!) 35   Temp:    98.6 F (37 C)  TempSrc:    Axillary  SpO2: (!) 71% (!) 82% 98%   Weight:      Height:        Intake/Output Summary (Last 24 hours) at 04/10/2020 1235 Last data filed at 04/10/2020 0600 Gross per 24 hour  Intake  720 ml  Output 1300 ml  Net -580 ml   Filed Weights   04/05/20 1419 04/07/20 1000 04/09/20 0550  Weight: 104.3 kg 98.9 kg 99.1 kg    Gen: 65 y.o. female in no acute distress Pulm: Normal effort, without distress, diminished breath sounds bilaterally CV: Regular rate and rhythm. No murmur, rub, or gallop. No JVD, no pedal edema. GI: Abdomen soft, non-tender, non-distended, with normoactive bowel sounds. No organomegaly or masses felt. Ext: Warm, no deformities Skin: No rashes, lesions or ulcers Neuro: Alert and oriented. No focal neurological deficits. Psych: Judgement and insight appear normal. Mood & affect appropriate.   Data Reviewed: I have personally reviewed following labs and imaging studies  CBC: Recent Labs  Lab 04/06/20 0537 04/07/20 0146 04/08/20 0157 04/09/20 0236 04/10/20 0913  WBC 5.5 4.8 9.3 10.0 11.2*  NEUTROABS 4.4 3.8 8.0* 8.9* 10.1*  HGB 12.3 11.5* 11.7* 11.7* 11.7*  HCT 39.1 38.0 37.8 37.4 36.3  MCV 87.3 87.8 86.3 86.0 84.2  PLT 201 208 233 220 324   Basic Metabolic Panel: Recent Labs  Lab 04/06/20 0537 04/07/20 0146 04/08/20 0157 04/09/20 0236 04/10/20 0913  NA 134* 136 137 138 136  K 4.0 3.9 4.4 4.5 4.4  CL 102 104 104 104 101  CO2 _0 GLUCOSE 130* 166* 130* 142* 125*  BUN 19 23 29* 23 24*  CREATININE 0.96 0.90 0.85 0.84 0.83  CALCIUM 7.7* 8.3* 8.8* 8.7* 8.8*  MG 2.3  --   --   --   --   PHOS 2.9  --   --   --   --    GFR: Estimated Creatinine Clearance: 74.8 mL/min (by C-G formula based on SCr of 0.83 mg/dL). Liver Function Tests: Recent Labs  Lab 04/06/20 0537 04/07/20 0146 04/08/20 0157 04/09/20 0236 04/10/20 0913  AST 80* 71* 88* 64* 44*  ALT 93* 86* 108* 96* 80*  ALKPHOS 59 69 89 105 97  BILITOT 0.4 0.5 0.5 0.7 0.6  PROT 6.5 6.1* 6.4* 6.2* 6.4*  ALBUMIN 2.7* 2.6* 2.8* 2.8* 2.7*   No results for input(s): LIPASE, AMYLASE in the last 168 hours. No results for input(s): AMMONIA in the last 168  hours. Coagulation Profile: No results for input(s): INR, PROTIME in the last 168 hours. Cardiac Enzymes: No results for input(s): CKTOTAL, CKMB, CKMBINDEX, TROPONINI in the last 168 hours. BNP (last 3 results) No results for input(s): PROBNP in the last 8760 hours. HbA1C: No results for input(s): HGBA1C in the last 72 hours. CBG: Recent Labs  Lab 04/09/20 1616 04/09/20 2001 04/09/20 2347 04/10/20 0733 04/10/20 1113  GLUCAP 140* 116* 106* 137* 136*   Lipid Profile: No results for input(s): CHOL, HDL, LDLCALC, TRIG, CHOLHDL, LDLDIRECT in the last 72 hours. Thyroid Function Tests: No results  for input(s): TSH, T4TOTAL, FREET4, T3FREE, THYROIDAB in the last 72 hours. Anemia Panel: No results for input(s): VITAMINB12, FOLATE, FERRITIN, TIBC, IRON, RETICCTPCT in the last 72 hours. Urine analysis:    Component Value Date/Time   COLORURINE YELLOW 07/25/2019 1343   APPEARANCEUR CLEAR 07/25/2019 1343   APPEARANCEUR Cloudy (A) 04/25/2019 1132   LABSPEC 1.018 07/25/2019 1343   PHURINE 5.0 07/25/2019 1343   GLUCOSEU NEGATIVE 07/25/2019 1343   HGBUR NEGATIVE 07/25/2019 1343   BILIRUBINUR NEGATIVE 07/25/2019 1343   BILIRUBINUR Negative 04/25/2019 1132   KETONESUR NEGATIVE 07/25/2019 1343   PROTEINUR NEGATIVE 07/25/2019 1343   NITRITE NEGATIVE 07/25/2019 1343   LEUKOCYTESUR NEGATIVE 07/25/2019 1343   Recent Results (from the past 240 hour(s))  Blood Culture (routine x 2)     Status: None   Collection Time: 04/05/20  2:30 PM   Specimen: BLOOD LEFT FOREARM  Result Value Ref Range Status   Specimen Description   Final    BLOOD LEFT FOREARM Performed at [facility], [place] [address], [place], [place] [address]    Special Requests   Final    BOTTLES DRAWN AEROBIC AND ANAEROBIC Blood Culture adequate volume Performed at [place] [address] [place], [place] [address]    Culture   Final    NO GROWTH 5 DAYS Performed at [facility], [place] [address] [place], [place] [address]    Report Status 04/10/2020 FINAL  Final  SARS Coronavirus 2 by RT PCR (hospital order, performed in [facility] hospital lab) Nasopharyngeal Nasopharyngeal Swab     Status: Abnormal   Collection Time: 04/05/20  2:43 PM   Specimen: Nasopharyngeal Swab  Result Value Ref Range Status   SARS Coronavirus 2 POSITIVE (A) NEGATIVE Final    Comment: RESULT CALLED TO, READ BACK BY AND VERIFIED WITH: ZULETA, K. RN _0  04/05/20 BILLINGSLEY,L (NOTE) SARS-CoV-2 target nucleic acids are DETECTED  SARS-CoV-2 RNA is generally detectable in upper respiratory specimens  during the acute phase of infection.  Positive results are indicative  of the presence of the identified virus, but do not rule out bacterial infection or co-infection with other pathogens not detected by the test.  Clinical correlation with patient history and  other diagnostic information is necessary to determine patient infection status.  The expected result is negative.  Fact Sheet for Patients:   StrictlyIdeas.no   Fact Sheet for Healthcare Providers:   BankingDealers.co.za    This test is not yet approved or cleared by the [place] FDA and  has been authorized for detection and/or diagnosis of SARS-CoV-2 by FDA under an Emergency Use Authorization (EUA).  This EUA will remain in effect (meaning  this test can be used) for the duration of  the COVID-19 declaration under Section 564(b)(1) of the Act, 21 U.S.C. section 360-bbb-3(b)(1), unless the authorization is terminated or revoked sooner.  Performed at [facility], [place] [address], [place], [place] [address]   Blood Culture (routine x 2)     Status: None   Collection Time: 04/05/20  2:43 PM   Specimen: BLOOD  Result Value Ref Range Status   Specimen Description   Final    BLOOD LEFT ANTECUBITAL Performed at [place]  [address] [place], [place] [address]    Special Requests   Final    BOTTLES DRAWN AEROBIC AND ANAEROBIC Blood Culture results may not be optimal due to an inadequate volume of blood received in culture  bottles Performed at [place] [address] [place], [place] [address]    Culture   Final    NO GROWTH 5 DAYS Performed at [facility], [place] [address], [place], [place] [address]    Report Status 04/10/2020 FINAL  Final  MRSA PCR Screening     Status: None   Collection Time: 04/06/20  5:22 PM   Specimen: Nasal Mucosa; Nasopharyngeal  Result Value Ref Range Status   MRSA by PCR NEGATIVE NEGATIVE Final    Comment:        The GeneXpert MRSA Assay (FDA approved for NASAL specimens only), is one component of a comprehensive MRSA colonization surveillance program. It is not intended to diagnose MRSA infection nor to guide or monitor treatment for MRSA infections. Performed at [facility], [place] [address] [place], [place] [address]       Radiology Studies: No results found.  Scheduled Meds: . (feeding supplement) PROSource Plus  30 mL Oral BID BM  . vitamin C  500 mg Oral Daily  . baricitinib  4 mg Oral Daily  . Chlorhexidine Gluconate Cloth  6 each Topical Daily  . dextromethorphan-guaiFENesin  1 tablet Oral BID  . enoxaparin (LOVENOX) injection  50 mg Subcutaneous Q12H  . feeding supplement (K[employer]ARMS STANDARD 1.4)  325 mL Oral Daily  . Ipratropium-Albuterol  1 puff Inhalation Q6H  . mouth rinse  15 mL Mouth Rinse BID  . methylPREDNISolone (SOLU-MEDROL) injection  80 mg Intravenous BID  . multivitamin with minerals  1 tablet Oral Daily  . oxymetazoline  1 spray Each Nare BID  . pantoprazole  40 mg Oral Daily  . zinc sulfate  220 mg Oral Daily   Continuous Infusions:   LOS: 5 days   Time spent: 35 minutes  Little Ishikawa, DO [facility] Secure chat for pager 04/10/2020, 12:35 PM

## 2020-04-10 NOTE — Progress Notes (Signed)
Pt. Continues to adamantly refuse Bipap usage despite education.  Will continue to monitor.

## 2020-04-11 LAB — GLUCOSE, CAPILLARY
Glucose-Capillary: 148 mg/dL — ABNORMAL HIGH (ref 70–99)
Glucose-Capillary: 133 mg/dL — ABNORMAL HIGH (ref 70–99)
Glucose-Capillary: 122 mg/dL — ABNORMAL HIGH (ref 70–99)
Glucose-Capillary: 108 mg/dL — ABNORMAL HIGH (ref 70–99)
Glucose-Capillary: 139 mg/dL — ABNORMAL HIGH (ref 70–99)
Glucose-Capillary: 121 mg/dL — ABNORMAL HIGH (ref 70–99)

## 2020-04-11 LAB — CBC
HCT: 37.9 % (ref 36.0–46.0)
Hemoglobin: 12.1 g/dL (ref 12.0–15.0)
MCH: 27 pg (ref 26.0–34.0)
MCHC: 31.9 g/dL (ref 30.0–36.0)
MCV: 84.6 fL (ref 80.0–100.0)
Platelets: 252 10*3/uL (ref 150–400)
RBC: 4.48 MIL/uL (ref 3.87–5.11)
RDW: 14.4 % (ref 11.5–15.5)
WBC: 9.5 10*3/uL (ref 4.0–10.5)
nRBC: 0 % (ref 0.0–0.2)

## 2020-04-11 LAB — COMPREHENSIVE METABOLIC PANEL
ALT: 73 U/L — ABNORMAL HIGH (ref 0–44)
AST: 37 U/L (ref 15–41)
Albumin: 2.8 g/dL — ABNORMAL LOW (ref 3.5–5.0)
Alkaline Phosphatase: 98 U/L (ref 38–126)
Anion gap: 10 (ref 5–15)
BUN: 28 mg/dL — ABNORMAL HIGH (ref 8–23)
CO2: 25 mmol/L (ref 22–32)
Calcium: 9.1 mg/dL (ref 8.9–10.3)
Chloride: 101 mmol/L (ref 98–111)
Creatinine, Ser: 0.88 mg/dL (ref 0.44–1.00)
GFR calc Af Amer: >60 mL/min (ref >60)
GFR calc non Af Amer: >60 mL/min (ref >60)
Glucose, Bld: 142 mg/dL — ABNORMAL HIGH (ref 70–99)
Potassium: 4.4 mmol/L (ref 3.5–5.1)
Sodium: 136 mmol/L (ref 135–145)
Total Bilirubin: 0.6 mg/dL (ref 0.3–1.2)
Total Protein: 6.8 g/dL (ref 6.5–8.1)

## 2020-04-11 LAB — C-REACTIVE PROTEIN: CRP: 5.7 mg/dL — ABNORMAL HIGH (ref <1.0)

## 2020-04-11 LAB — D-DIMER, QUANTITATIVE: D-Dimer, Quant: 7.2 ug/mL-FEU — ABNORMAL HIGH (ref 0.00–0.50)

## 2020-04-11 NOTE — Plan of Care (Signed)
Discussed with patient plan of care for the evening, pain management and wearing Bipap with some teach back displayed.  Patient stated she wanted to wear it during the day when more people could watch her.  I explain that we round on a consistent basis.  We are able to see her through the glass doors.  I reminded her that the Bipap has alarms on it as well we are monitoring her telemetry measurements.  And said its better to wear at night to promote rest, better breathing and ability to eat food during the day.  Patient still wants to wear during the day time.

## 2020-04-11 NOTE — Plan of Care (Signed)
Discussed with patient plan of care for the evening, pain management and importance of wearing a Bipap with no desire to do so at this time.

## 2020-04-11 NOTE — Progress Notes (Signed)
PROGRESS NOTE  Takeela Peil  UKR:[id number] DOB: [birth year] DOA: 04/05/2020 PCP: Nolene Ebbs, MD   Brief Narrative: Alayah Knouff is a 64 y.o. female with a history of morbid obesity, OSA not on CPAP, GAD, GERD, and recent covid-19 diagnosis at PCP office who presented to the ED 8/23 having been found passed out and in respiratory distress at home. In the ED, she was febrile, tachycardic, tachypneic 78% on 15L NRB with hypoxia confirmed pO2 27 on VBG. CTA chest was performed showing no PE but did reveal extensive bilateral parenchymal infiltrates consistent with covid-19 pneumonia. Remdesivir, steroids, and baricitinib started and the patient was admitted to medicine service after consultation with PCCM.  Assessment & Plan: Active Problems:   Obstructive sleep apnea   Generalized anxiety disorder   Morbid obesity ([place])   Hypertension   GERD (gastroesophageal reflux disease)   Acute hypoxemic respiratory failure due to COVID-19 [facility])   Acute hypoxemic respiratory failure due to covid-19 pneumonia:  - SARS-CoV-2 testing reportedly positive ~8/16 at PCP -can remove quarantine at 04/19/2020 - Patient continues to be noncompliant with BiPAP, proning, early ambulation - Continue remdesivir x5 days (8/23 - 8/27) - Continue steroids - methylprednisolone 56m IV q12h -through 04/14/2020, would likely benefit from prolonged steroid taper - Continue baricitinib, started 8/23, for 14 days (9/5) or until hospital discharge.  - Encourage OOB, IS, FV, and awake proning if she will comply - Enoxaparin intermediate dosing, CTA chest showed no PE. SpO2: 90 % O2 Flow Rate (L/min): 15 L/min FiO2 (%): 100 % Recent Labs    04/09/20 0236 04/10/20 0913 04/11/20 0614  DDIMER 8.12* 6.78* 7.20*  CRP 3.2* 7.6* 5.7*   History of asthma: No current wheezing, likely not acute exacerbation.  - Continue BDs and steroids. -Likely complicating her respiratory status as above  OSA: Pt not  on CPAP at home.  - Bipap HS and PRN per discussion with PCCM - patient remains non compliant; lengthy discussion about ongoing needs especially during COVID infection.  LFT elevation: Likely due to viral illness. Hepatic Function Latest Ref Rng & Units 04/11/2020 04/10/2020 04/09/2020  Total Protein 6.5 - 8.1 g/dL 6.8 6.4(L) 6.2(L)  Albumin 3.5 - 5.0 g/dL 2.8(L) 2.7(L) 2.8(L)  AST 15 - 41 U/L 37 44(H) 64(H)  ALT 0 - 44 U/L 73(H) 80(H) 96(H)  Alk Phosphatase 38 - 126 U/L 98 97 105  Total Bilirubin 0.3 - 1.2 mg/dL 0.6 0.6 0.7    AKI, without history of CKD, poa:  - Improving. Continue monitoring Lab Results  Component Value Date   CREATININE 0.88 04/11/2020   CREATININE 0.83 04/10/2020   CREATININE 0.84 04/09/2020      Obesity: Estimated body mass index is 38.7 kg/m as calculated from the following:   Height as of this encounter: _0  (1.6 m).   Weight as of this encounter: 99.1 kg.  DVT prophylaxis: Lovenox Code Status: Full Family Communication: Daughter updated previously by phone Disposition Plan:   Status is: Inpatient Remains inpatient appropriate because:Inpatient level of care appropriate due to severity of illness given ongoing severe hypoxia in the setting of COVID-19 pneumonia Dispo: The patient is from: Home               Anticipated d/c is to: TBD, based on clinical trajectory               Anticipated d/c date is: > 3 days  Patient currently is not medically stable to d/c.  Consultants:   None  Procedures:   None  Antimicrobials:  Remdesivir   Subjective: No acute issues/events overnight -patient continues to be markedly dyspneic even at rest, she remains noncompliant with BiPAP, proning, early ambulation per overnight and daytime nursing/PT. Encouraged to comply more today as this will shorten her hospital stay and improve her clinical status more rapidly.  Objective: Vitals:   04/11/20 0400 04/11/20 0455 04/11/20 0500 04/11/20 0600    BP: (!) 152/98  (!) 147/88 (!) 153/94  Pulse: 83 82 81 87  Resp: (!) 25 18 (!) 24 (!) 23  Temp:      TempSrc:      SpO2: 91% 94% 95% 90%  Weight:      Height:        Intake/Output Summary (Last 24 hours) at 04/11/2020 0722 Last data filed at 04/11/2020 0600 Gross per 24 hour  Intake 360 ml  Output 1150 ml  Net -790 ml   Filed Weights   04/05/20 1419 04/07/20 1000 04/09/20 0550  Weight: 104.3 kg 98.9 kg 99.1 kg   General:  Pleasantly resting in bed, No acute distress. HEENT:  Normocephalic atraumatic.  Sclerae nonicteric, noninjected.  Extraocular movements intact bilaterally. Neck:  Without mass or deformity.  Trachea is midline. Lungs: Markedly so diminished breath sounds bilaterally Heart:  Regular rate and rhythm.  Without murmurs, rubs, or gallops. Abdomen:  Soft, nontender, nondistended.  Without guarding or rebound. Extremities: Without cyanosis, clubbing, edema, or obvious deformity. Vascular:  Dorsalis pedis and posterior tibial pulses palpable bilaterally. Skin:  Warm and dry, no erythema, no ulcerations.  Data Reviewed: I have personally reviewed following labs and imaging studies  CBC: Recent Labs  Lab 04/06/20 0537 04/06/20 0537 04/07/20 0146 04/08/20 0157 04/09/20 0236 04/10/20 0913 04/11/20 0614  WBC 5.5   < > 4.8 9.3 10.0 11.2* 9.5  NEUTROABS 4.4  --  3.8 8.0* 8.9* 10.1*  --   HGB 12.3   < > 11.5* 11.7* 11.7* 11.7* 12.1  HCT 39.1   < > 38.0 37.8 37.4 36.3 37.9  MCV 87.3   < > 87.8 86.3 86.0 84.2 84.6  PLT 201   < > 208 233 220 230 252   < > = values in this interval not displayed.   Basic Metabolic Panel: Recent Labs  Lab 04/06/20 0537 04/06/20 0537 04/07/20 0146 04/08/20 0157 04/09/20 0236 04/10/20 0913 04/11/20 0614  NA 134*   < > 136 137 138 136 136  K 4.0   < > 3.9 4.4 4.5 4.4 4.4  CL 102   < > 104 104 104 101 101  CO2 22   < > _0 GLUCOSE 130*   < > 166* 130* 142* 125* 142*  BUN 19   < > 23 29* 23 24* 28*  CREATININE  0.96   < > 0.90 0.85 0.84 0.83 0.88  CALCIUM 7.7*   < > 8.3* 8.8* 8.7* 8.8* 9.1  MG 2.3  --   --   --   --   --   --   PHOS 2.9  --   --   --   --   --   --    < > = values in this interval not displayed.   GFR: Estimated Creatinine Clearance: 70.6 mL/min (by C-G formula based on SCr of 0.88 mg/dL). Liver Function Tests: Recent Labs  Lab 04/07/20 0146 04/08/20 0157 04/09/20  0236 04/10/20 0913 04/11/20 0614  AST 71* 88* 64* 44* 37  ALT 86* 108* 96* 80* 73*  ALKPHOS 69 89 105 97 98  BILITOT 0.5 0.5 0.7 0.6 0.6  PROT 6.1* 6.4* 6.2* 6.4* 6.8  ALBUMIN 2.6* 2.8* 2.8* 2.7* 2.8*   No results for input(s): LIPASE, AMYLASE in the last 168 hours. No results for input(s): AMMONIA in the last 168 hours. Coagulation Profile: No results for input(s): INR, PROTIME in the last 168 hours. Cardiac Enzymes: No results for input(s): CKTOTAL, CKMB, CKMBINDEX, TROPONINI in the last 168 hours. BNP (last 3 results) No results for input(s): PROBNP in the last 8760 hours. HbA1C: No results for input(s): HGBA1C in the last 72 hours. CBG: Recent Labs  Lab 04/10/20 1113 04/10/20 1507 04/10/20 2041 04/11/20 0105 04/11/20 0613  GLUCAP 136* 130* 139* 148* 133*   Lipid Profile: No results for input(s): CHOL, HDL, LDLCALC, TRIG, CHOLHDL, LDLDIRECT in the last 72 hours. Thyroid Function Tests: No results for input(s): TSH, T4TOTAL, FREET4, T3FREE, THYROIDAB in the last 72 hours. Anemia Panel: No results for input(s): VITAMINB12, FOLATE, FERRITIN, TIBC, IRON, RETICCTPCT in the last 72 hours. Urine analysis:    Component Value Date/Time   COLORURINE YELLOW 07/25/2019 1343   APPEARANCEUR CLEAR 07/25/2019 1343   APPEARANCEUR Cloudy (A) 04/25/2019 1132   LABSPEC 1.018 07/25/2019 1343   PHURINE 5.0 07/25/2019 1343   GLUCOSEU NEGATIVE 07/25/2019 1343   HGBUR NEGATIVE 07/25/2019 1343   BILIRUBINUR NEGATIVE 07/25/2019 1343   BILIRUBINUR Negative 04/25/2019 1132   KETONESUR NEGATIVE 07/25/2019 1343    PROTEINUR NEGATIVE 07/25/2019 1343   NITRITE NEGATIVE 07/25/2019 1343   LEUKOCYTESUR NEGATIVE 07/25/2019 1343   Recent Results (from the past 240 hour(s))  Blood Culture (routine x 2)     Status: None   Collection Time: 04/05/20  2:30 PM   Specimen: BLOOD LEFT FOREARM  Result Value Ref Range Status   Specimen Description   Final    BLOOD LEFT FOREARM Performed at [facility], [place] [address], [place], [place] [address]    Special Requests   Final    BOTTLES DRAWN AEROBIC AND ANAEROBIC Blood Culture adequate volume Performed at [facility] [address] [place], [place] [address]    Culture   Final    NO GROWTH 5 DAYS Performed at [facility], [place] [address] [place], [place] [address]    Report Status 04/10/2020 FINAL  Final  SARS Coronavirus 2 by RT PCR (hospital order, performed in [facility] hospital lab) Nasopharyngeal Nasopharyngeal Swab     Status: Abnormal   Collection Time: 04/05/20  2:43 PM   Specimen: Nasopharyngeal Swab  Result Value Ref Range Status   SARS Coronavirus 2 POSITIVE (A) NEGATIVE Final    Comment: RESULT CALLED TO, READ BACK BY AND VERIFIED WITH: ZULETA, K. RN _0  04/05/20 BILLINGSLEY,L (NOTE) SARS-CoV-2 target nucleic acids are DETECTED  SARS-CoV-2 RNA is generally detectable in upper respiratory specimens  during the acute phase of infection.  Positive results are indicative  of the presence of the identified virus, but do not rule out bacterial infection or co-infection with other pathogens not detected by the test.  Clinical correlation with patient history and  other diagnostic information is necessary to determine patient infection status.  The expected result is negative.  Fact Sheet for Patients:   StrictlyIdeas.no   Fact Sheet for Healthcare Providers:   BankingDealers.co.za    This test is not yet approved or cleared  by the [place]  and  has been authorized for detection and/or diagnosis of SARS-CoV-2 by FDA under an Emergency Use Authorization (EUA).  This EUA will remain in effect (meaning  this test can be used) for the duration of  the COVID-19 declaration under Section 564(b)(1) of the Act, 21 U.S.C. section 360-bbb-3(b)(1), unless the authorization is terminated or revoked sooner.  Performed at [facility], [place] [address] [place], [place] [address]   Blood Culture (routine x 2)     Status: None   Collection Time: 04/05/20  2:43 PM   Specimen: BLOOD  Result Value Ref Range Status   Specimen Description   Final    BLOOD LEFT ANTECUBITAL Performed at [facility] [address], [place], [place] [address]    Special Requests   Final    BOTTLES DRAWN AEROBIC AND ANAEROBIC Blood Culture results may not be optimal due to an inadequate volume of blood received in culture bottles Performed at [facility] [address] [place], [place] [address]    Culture   Final    NO GROWTH 5 DAYS Performed at [facility], [place] [address], [place], [place] [address]    Report Status 04/10/2020 FINAL  Final  MRSA PCR Screening     Status: None   Collection Time: 04/06/20  5:22 PM   Specimen: Nasal Mucosa; Nasopharyngeal  Result Value Ref Range Status   MRSA by PCR NEGATIVE NEGATIVE Final    Comment:        The GeneXpert MRSA Assay (FDA approved for NASAL specimens only), is one component of a comprehensive MRSA colonization surveillance program. It is not intended to diagnose MRSA infection nor to guide or monitor treatment for MRSA infections. Performed at [facility], [place] [address], [place], [place] [address]       Radiology Studies: No results found.  Scheduled Meds: . (feeding supplement) PROSource Plus  30 mL Oral BID BM  . vitamin C  500 mg Oral Daily  . baricitinib  4 mg Oral Daily  . Chlorhexidine Gluconate  Cloth  6 each Topical Daily  . dextromethorphan-guaiFENesin  1 tablet Oral BID  . enoxaparin (LOVENOX) injection  50 mg Subcutaneous Q12H  . feeding supplement (K[employer]MS STANDARD 1.4)  325 mL Oral Daily  . Ipratropium-Albuterol  1 puff Inhalation Q6H  . mouth rinse  15 mL Mouth Rinse BID  . methylPREDNISolone (SOLU-MEDROL) injection  80 mg Intravenous BID  . multivitamin with minerals  1 tablet Oral Daily  . oxymetazoline  1 spray Each Nare BID  . pantoprazole  40 mg Oral Daily  . zinc sulfate  220 mg Oral Daily   Continuous Infusions:   LOS: 6 days   Time spent: 35 minutes  Little Ishikawa, DO [facility] Secure chat for pager 04/11/2020, 7:22 AM

## 2020-04-11 NOTE — Plan of Care (Addendum)
Tried to inquire why patient is resistant to Bipap.

## 2020-04-11 NOTE — Progress Notes (Signed)
Patient continues to be non compliant with wearing BIPAP. Nursing and RT explained to Patient how important it is to wear it. Patient still refuses. RT will continue to monitor

## 2020-04-12 LAB — GLUCOSE, CAPILLARY
Glucose-Capillary: 132 mg/dL — ABNORMAL HIGH (ref 70–99)
Glucose-Capillary: 141 mg/dL — ABNORMAL HIGH (ref 70–99)
Glucose-Capillary: 118 mg/dL — ABNORMAL HIGH (ref 70–99)
Glucose-Capillary: 110 mg/dL — ABNORMAL HIGH (ref 70–99)
Glucose-Capillary: 162 mg/dL — ABNORMAL HIGH (ref 70–99)
Glucose-Capillary: 145 mg/dL — ABNORMAL HIGH (ref 70–99)

## 2020-04-12 LAB — COMPREHENSIVE METABOLIC PANEL
ALT: 79 U/L — ABNORMAL HIGH (ref 0–44)
AST: 46 U/L — ABNORMAL HIGH (ref 15–41)
Albumin: 2.7 g/dL — ABNORMAL LOW (ref 3.5–5.0)
Alkaline Phosphatase: 92 U/L (ref 38–126)
Anion gap: 10 (ref 5–15)
BUN: 34 mg/dL — ABNORMAL HIGH (ref 8–23)
CO2: 24 mmol/L (ref 22–32)
Calcium: 9.3 mg/dL (ref 8.9–10.3)
Chloride: 102 mmol/L (ref 98–111)
Creatinine, Ser: 0.9 mg/dL (ref 0.44–1.00)
GFR calc Af Amer: >60 mL/min (ref >60)
GFR calc non Af Amer: >60 mL/min (ref >60)
Glucose, Bld: 136 mg/dL — ABNORMAL HIGH (ref 70–99)
Potassium: 5.1 mmol/L (ref 3.5–5.1)
Sodium: 136 mmol/L (ref 135–145)
Total Bilirubin: 0.7 mg/dL (ref 0.3–1.2)
Total Protein: 6.2 g/dL — ABNORMAL LOW (ref 6.5–8.1)

## 2020-04-12 LAB — CBC
HCT: 36.8 % (ref 36.0–46.0)
Hemoglobin: 11.6 g/dL — ABNORMAL LOW (ref 12.0–15.0)
MCH: 27.1 pg (ref 26.0–34.0)
MCHC: 31.5 g/dL (ref 30.0–36.0)
MCV: 86 fL (ref 80.0–100.0)
Platelets: 262 10*3/uL (ref 150–400)
RBC: 4.28 MIL/uL (ref 3.87–5.11)
RDW: 14.3 % (ref 11.5–15.5)
WBC: 12.9 10*3/uL — ABNORMAL HIGH (ref 4.0–10.5)
nRBC: 0 % (ref 0.0–0.2)

## 2020-04-12 LAB — C-REACTIVE PROTEIN: CRP: 2.6 mg/dL — ABNORMAL HIGH (ref <1.0)

## 2020-04-12 LAB — D-DIMER, QUANTITATIVE: D-Dimer, Quant: 5.69 ug/mL-FEU — ABNORMAL HIGH (ref 0.00–0.50)

## 2020-04-12 NOTE — Progress Notes (Signed)
Pt unable to wear bipap on floor due to covid diagnosis.

## 2020-04-12 NOTE — Progress Notes (Signed)
Pt. currently unable to be placed on BiPAP due to (+)COVID results.

## 2020-04-12 NOTE — Progress Notes (Signed)
PROGRESS NOTE  Avriel Kandel  XMI:[id number] DOB: [birth year] DOA: 04/05/2020 PCP: Nolene Ebbs, MD   Brief Narrative: Kortlyn Koltz is a 64 y.o. female with a history of morbid obesity, OSA not on CPAP, GAD, GERD, and recent covid-19 diagnosis at PCP office who presented to the ED 8/23 having been found passed out and in respiratory distress at home. In the ED, she was febrile, tachycardic, tachypneic 78% on 15L NRB with hypoxia confirmed pO2 27 on VBG. CTA chest was performed showing no PE but did reveal extensive bilateral parenchymal infiltrates consistent with covid-19 pneumonia. Remdesivir, steroids, and baricitinib started and the patient was admitted to medicine service after consultation with PCCM.  Assessment & Plan: Active Problems:   Obstructive sleep apnea   Generalized anxiety disorder   Morbid obesity (HCC)   Hypertension   GERD (gastroesophageal reflux disease)   Acute hypoxemic respiratory failure due to COVID-19 [facility])   Acute hypoxemic respiratory failure due to covid-19 pneumonia:  - Covid positive PCR 816 with PCP, continue quarantine through 04/19/2020 - Patient has moderate to severe disease given comorbidities, body habitus and symptoms.  She remains markedly noncompliant with respiratory support/BiPAP - Previously completed Remdesivir -Continue high-dose methylprednisone steroids through 04/14/2020, would likely benefit from prolonged steroid taper - Continue baricitinib through 9/5 until hospital discharge.  - Encourage OOB, IS, FV, and awake proning if she will comply - Enoxaparin intermediate dosing, CTA chest showed no PE. SpO2: 94 % O2 Flow Rate (L/min): 15 L/min FiO2 (%): 100 % Recent Labs    04/10/20 0913 04/11/20 0614 04/12/20 0235  DDIMER 6.78* 7.20* 5.69*  CRP 7.6* 5.7* 2.6*   History of asthma: No current wheezing, likely not acute exacerbation.  - Continue BDs and steroids. - Likely complicating her respiratory status as  above  OSA: Noncompliant with CPAP at home, noncompliant here as well.  - Bipap HS and PRN per discussion with PCCM - patient remains non compliant; lengthy discussion about ongoing needs especially during COVID infection. -Patient agrees to 1 hour trial of BiPAP today while awake to "trial" before tonight  LFT elevation: Likely due to viral illness. Hepatic Function Latest Ref Rng & Units 04/12/2020 04/11/2020 04/10/2020  Total Protein 6.5 - 8.1 g/dL 6.2(L) 6.8 6.4(L)  Albumin 3.5 - 5.0 g/dL 2.7(L) 2.8(L) 2.7(L)  AST 15 - 41 U/L 46(H) 37 44(H)  ALT 0 - 44 U/L 79(H) 73(H) 80(H)  Alk Phosphatase 38 - 126 U/L 92 98 97  Total Bilirubin 0.3 - 1.2 mg/dL 0.7 0.6 0.6    AKI, without history of CKD, poa:  -Resolved. Continue monitoring Lab Results  Component Value Date   CREATININE 0.90 04/12/2020   CREATININE 0.88 04/11/2020   CREATININE 0.83 04/10/2020      Obesity: Estimated body mass index is 38.7 kg/m as calculated from the following:   Height as of this encounter: _0  (1.6 m).   Weight as of this encounter: 99.1 kg.  DVT prophylaxis: Lovenox Code Status: Full Family Communication: Daughter updated previously by phone Disposition Plan:   Status is: Inpatient Remains inpatient appropriate because:Inpatient level of care appropriate due to severity of illness given ongoing severe hypoxia in the setting of COVID-19 pneumonia Dispo: The patient is from: Home               Anticipated d/c is to: Yet to be determined, pending clinical course               Anticipated d/c date is: >  3 days               Patient currently is not medically stable to d/c.  Consultants:   None  Procedures:   None  Antimicrobials:  Remdesivir   Subjective: No acute issues or events overnight, patient continues to refuse BiPAP overnight, we had lengthy discussion about need for respiratory support in the setting of sleep apnea and Covid.  She has agreed to 1 hour trial of BiPAP today as above.   She otherwise has very poor ambulatory and exertional capacity and is self limiting her activity.  Objective: Vitals:   04/12/20 0426 04/12/20 0500 04/12/20 0600 04/12/20 0700  BP:  (!) 147/83 (!) 140/94   Pulse: 80 82 65 80  Resp: (!) 24 (!) 25 (!) 30 (!) 22  Temp:      TempSrc:      SpO2: 92% 94% 92% 94%  Weight:      Height:        Intake/Output Summary (Last 24 hours) at 04/12/2020 0730 Last data filed at 04/12/2020 0302 Gross per 24 hour  Intake 480 ml  Output 1450 ml  Net -970 ml   Filed Weights   04/05/20 1419 04/07/20 1000 04/09/20 0550  Weight: 104.3 kg 98.9 kg 99.1 kg   General:  Pleasantly resting in bed, No acute distress. HEENT:  Normocephalic atraumatic.  Sclerae nonicteric, noninjected.  Extraocular movements intact bilaterally. Neck:  Without mass or deformity.  Trachea is midline. Lungs: Markedly diminished breath sounds bilaterally Heart:  Regular rate and rhythm.  Without murmurs, rubs, or gallops. Abdomen:  Soft, nontender, nondistended.  Without guarding or rebound. Extremities: Without cyanosis, clubbing, edema, or obvious deformity. Vascular:  Dorsalis pedis and posterior tibial pulses palpable bilaterally. Skin:  Warm and dry, no erythema, no ulcerations.  Data Reviewed: I have personally reviewed following labs and imaging studies  CBC: Recent Labs  Lab 04/06/20 0537 04/06/20 0537 04/07/20 0146 04/07/20 0146 04/08/20 0157 04/09/20 0236 04/10/20 0913 04/11/20 0614 04/12/20 0235  WBC 5.5   < > 4.8   < > 9.3 10.0 11.2* 9.5 12.9*  NEUTROABS 4.4  --  3.8  --  8.0* 8.9* 10.1*  --   --   HGB 12.3   < > 11.5*   < > 11.7* 11.7* 11.7* 12.1 11.6*  HCT 39.1   < > 38.0   < > 37.8 37.4 36.3 37.9 36.8  MCV 87.3   < > 87.8   < > 86.3 86.0 84.2 84.6 86.0  PLT 201   < > 208   < > 233 220 230 252 262   < > = values in this interval not displayed.   Basic Metabolic Panel: Recent Labs  Lab 04/06/20 0537 04/07/20 0146 04/08/20 0157 04/09/20 0236  04/10/20 0913 04/11/20 0614 04/12/20 0235  NA 134*   < > 137 138 136 136 136  K 4.0   < > 4.4 4.5 4.4 4.4 5.1  CL 102   < > 104 104 101 101 102  CO2 22   < > _0 GLUCOSE 130*   < > 130* 142* 125* 142* 136*  BUN 19   < > 29* 23 24* 28* 34*  CREATININE 0.96   < > 0.85 0.84 0.83 0.88 0.90  CALCIUM 7.7*   < > 8.8* 8.7* 8.8* 9.1 9.3  MG 2.3  --   --   --   --   --   --  PHOS 2.9  --   --   --   --   --   --    < > = values in this interval not displayed.   GFR: Estimated Creatinine Clearance: 69 mL/min (by C-G formula based on SCr of 0.9 mg/dL). Liver Function Tests: Recent Labs  Lab 04/08/20 0157 04/09/20 0236 04/10/20 0913 04/11/20 0614 04/12/20 0235  AST 88* 64* 44* 37 46*  ALT 108* 96* 80* 73* 79*  ALKPHOS 89 105 97 98 92  BILITOT 0.5 0.7 0.6 0.6 0.7  PROT 6.4* 6.2* 6.4* 6.8 6.2*  ALBUMIN 2.8* 2.8* 2.7* 2.8* 2.7*   No results for input(s): LIPASE, AMYLASE in the last 168 hours. No results for input(s): AMMONIA in the last 168 hours. Coagulation Profile: No results for input(s): INR, PROTIME in the last 168 hours. Cardiac Enzymes: No results for input(s): CKTOTAL, CKMB, CKMBINDEX, TROPONINI in the last 168 hours. BNP (last 3 results) No results for input(s): PROBNP in the last 8760 hours. HbA1C: No results for input(s): HGBA1C in the last 72 hours. CBG: Recent Labs  Lab 04/11/20 1240 04/11/20 1657 04/11/20 1934 04/12/20 0111 04/12/20 0335  GLUCAP 108* 139* 121* 132* 141*   Lipid Profile: No results for input(s): CHOL, HDL, LDLCALC, TRIG, CHOLHDL, LDLDIRECT in the last 72 hours. Thyroid Function Tests: No results for input(s): TSH, T4TOTAL, FREET4, T3FREE, THYROIDAB in the last 72 hours. Anemia Panel: No results for input(s): VITAMINB12, FOLATE, FERRITIN, TIBC, IRON, RETICCTPCT in the last 72 hours. Urine analysis:    Component Value Date/Time   COLORURINE YELLOW 07/25/2019 1343   APPEARANCEUR CLEAR 07/25/2019 1343   APPEARANCEUR Cloudy (A)  04/25/2019 1132   LABSPEC 1.018 07/25/2019 1343   PHURINE 5.0 07/25/2019 1343   GLUCOSEU NEGATIVE 07/25/2019 1343   HGBUR NEGATIVE 07/25/2019 1343   BILIRUBINUR NEGATIVE 07/25/2019 1343   BILIRUBINUR Negative 04/25/2019 1132   KETONESUR NEGATIVE 07/25/2019 1343   PROTEINUR NEGATIVE 07/25/2019 1343   NITRITE NEGATIVE 07/25/2019 1343   LEUKOCYTESUR NEGATIVE 07/25/2019 1343   Recent Results (from the past 240 hour(s))  Blood Culture (routine x 2)     Status: None   Collection Time: 04/05/20  2:30 PM   Specimen: BLOOD LEFT FOREARM  Result Value Ref Range Status   Specimen Description   Final    BLOOD LEFT FOREARM Performed at [facility], [place] [address], [place], [place] [address]    Special Requests   Final    BOTTLES DRAWN AEROBIC AND ANAEROBIC Blood Culture adequate volume Performed at [place] [address] [place], [place] [address]    Culture   Final    NO GROWTH 5 DAYS Performed at [facility], [place] [address], [place], [place] [address]    Report Status 04/10/2020 FINAL  Final  SARS Coronavirus 2 by RT PCR (hospital order, performed in [facility] hospital lab) Nasopharyngeal Nasopharyngeal Swab     Status: Abnormal   Collection Time: 04/05/20  2:43 PM   Specimen: Nasopharyngeal Swab  Result Value Ref Range Status   SARS Coronavirus 2 POSITIVE (A) NEGATIVE Final    Comment: RESULT CALLED TO, READ BACK BY AND VERIFIED WITH: ZULETA, K. RN _0  04/05/20 BILLINGSLEY,L (NOTE) SARS-CoV-2 target nucleic acids are DETECTED  SARS-CoV-2 RNA is generally detectable in upper respiratory specimens  during the acute phase of infection.  Positive results are indicative  of the presence of the identified virus, but do not rule out bacterial infection or co-infection with  other pathogens not detected by the test.  Clinical correlation with patient history and  other diagnostic information is necessary to determine patient infection  status.  The expected result is negative.  Fact Sheet for Patients:   StrictlyIdeas.no   Fact Sheet for Healthcare Providers:   BankingDealers.co.za    This test is not yet approved or cleared by the [place] FDA and  has been authorized for detection and/or diagnosis of SARS-CoV-2 by FDA under an Emergency Use Authorization (EUA).  This EUA will remain in effect (meaning  this test can be used) for the duration of  the COVID-19 declaration under Section 564(b)(1) of the Act, 21 U.S.C. section 360-bbb-3(b)(1), unless the authorization is terminated or revoked sooner.  Performed at [facility], [place] [address], [place], [place] [address]   Blood Culture (routine x 2)     Status: None   Collection Time: 04/05/20  2:43 PM   Specimen: BLOOD  Result Value Ref Range Status   Specimen Description   Final    BLOOD LEFT ANTECUBITAL Performed at [place] [address] [place], [place] [address]    Special Requests   Final    BOTTLES DRAWN AEROBIC AND ANAEROBIC Blood Culture results may not be optimal due to an inadequate volume of blood received in culture bottles Performed at [place] [address], [place], [place] [address]    Culture   Final    NO GROWTH 5 DAYS Performed at [facility], [place] [address], [place], [place] [address]    Report Status 04/10/2020 FINAL  Final  MRSA PCR Screening     Status: None   Collection Time: 04/06/20  5:22 PM   Specimen: Nasal Mucosa; Nasopharyngeal  Result Value Ref Range Status   MRSA by PCR NEGATIVE NEGATIVE Final    Comment:        The GeneXpert MRSA Assay (FDA approved for NASAL specimens only), is one component of a comprehensive MRSA colonization surveillance program. It is not intended to diagnose MRSA infection nor to guide or monitor treatment for MRSA infections. Performed at [facility], [place] [address], [place], [place] [address]      Radiology Studies: No results found.  Scheduled Meds: . (feeding supplement) PROSource Plus  30 mL Oral BID BM  . vitamin C  500 mg Oral Daily  . baricitinib  4 mg Oral Daily  . Chlorhexidine Gluconate Cloth  6 each Topical Daily  . dextromethorphan-guaiFENesin  1 tablet Oral BID  . enoxaparin (LOVENOX) injection  50 mg Subcutaneous Q12H  . feeding supplement (K[employer] STANDARD 1.4)  325 mL Oral Daily  . Ipratropium-Albuterol  1 puff Inhalation Q6H  . mouth rinse  15 mL Mouth Rinse BID  . methylPREDNISolone (SOLU-MEDROL) injection  80 mg Intravenous BID  . multivitamin with minerals  1 tablet Oral Daily  . oxymetazoline  1 spray Each Nare BID  . pantoprazole  40 mg Oral Daily  . zinc sulfate  220 mg Oral Daily   Continuous Infusions:   LOS: 7 days   Time spent: 35 minutes  Little Ishikawa, DO [facility] Secure chat for pager  04/12/2020, 7:30 AM

## 2020-04-12 NOTE — Progress Notes (Signed)
Patient's EBT card and [employer] benefits card given to daughter Burman Freestone at request of patient. Witnessed by Jonelle Sports, RN.

## 2020-04-12 NOTE — TOC Progression Note (Signed)
Transition of Care [facility]) - Progression Note    Patient Details  Name: Belita Warsame MRN: [id number] Date of Birth: Jul 02, 1956  Transition of Care [facility] CM/SW Contact  Leeroy Cha, RN Phone Number: 04/12/2020, 8:30 AM  Clinical Narrative:    Unvaccinated female with covid, hfnb at 15l/min, iv solumedrol, crp=2.6, d.dimere5.69, wbc 12.9/ desats to 85% but is refusing to wear bipap. P[lan is to return to home, following for progression and toc needs.   Expected Discharge Plan: Home/Self Care Barriers to Discharge: Continued Medical Work up  Expected Discharge Plan and Services Expected Discharge Plan: Home/Self Care   Discharge Planning Services: CM Consult   Living arrangements for the past 2 months: Apartment                                       Social Determinants of Health (SDOH) Interventions    Readmission Risk Interventions No flowsheet data found.

## 2020-04-13 LAB — COMPREHENSIVE METABOLIC PANEL
ALT: 86 U/L — ABNORMAL HIGH (ref 0–44)
AST: 39 U/L (ref 15–41)
Albumin: 3 g/dL — ABNORMAL LOW (ref 3.5–5.0)
Alkaline Phosphatase: 89 U/L (ref 38–126)
Anion gap: 9 (ref 5–15)
BUN: 34 mg/dL — ABNORMAL HIGH (ref 8–23)
CO2: 25 mmol/L (ref 22–32)
Calcium: 9.4 mg/dL (ref 8.9–10.3)
Chloride: 102 mmol/L (ref 98–111)
Creatinine, Ser: 0.87 mg/dL (ref 0.44–1.00)
GFR calc Af Amer: >60 mL/min (ref >60)
GFR calc non Af Amer: >60 mL/min (ref >60)
Glucose, Bld: 150 mg/dL — ABNORMAL HIGH (ref 70–99)
Potassium: 5.1 mmol/L (ref 3.5–5.1)
Sodium: 136 mmol/L (ref 135–145)
Total Bilirubin: 0.7 mg/dL (ref 0.3–1.2)
Total Protein: 6.5 g/dL (ref 6.5–8.1)

## 2020-04-13 LAB — CBC
HCT: 38.8 % (ref 36.0–46.0)
Hemoglobin: 12.1 g/dL (ref 12.0–15.0)
MCH: 26.9 pg (ref 26.0–34.0)
MCHC: 31.2 g/dL (ref 30.0–36.0)
MCV: 86.4 fL (ref 80.0–100.0)
Platelets: 246 10*3/uL (ref 150–400)
RBC: 4.49 MIL/uL (ref 3.87–5.11)
RDW: 14.3 % (ref 11.5–15.5)
WBC: 11.9 10*3/uL — ABNORMAL HIGH (ref 4.0–10.5)
nRBC: 0 % (ref 0.0–0.2)

## 2020-04-13 LAB — GLUCOSE, CAPILLARY
Glucose-Capillary: 132 mg/dL — ABNORMAL HIGH (ref 70–99)
Glucose-Capillary: 134 mg/dL — ABNORMAL HIGH (ref 70–99)
Glucose-Capillary: 136 mg/dL — ABNORMAL HIGH (ref 70–99)
Glucose-Capillary: 151 mg/dL — ABNORMAL HIGH (ref 70–99)
Glucose-Capillary: 168 mg/dL — ABNORMAL HIGH (ref 70–99)
Glucose-Capillary: 176 mg/dL — ABNORMAL HIGH (ref 70–99)
Glucose-Capillary: 201 mg/dL — ABNORMAL HIGH (ref 70–99)

## 2020-04-13 LAB — C-REACTIVE PROTEIN: CRP: 1.3 mg/dL — ABNORMAL HIGH (ref <1.0)

## 2020-04-13 LAB — D-DIMER, QUANTITATIVE: D-Dimer, Quant: 3.71 ug/mL-FEU — ABNORMAL HIGH (ref 0.00–0.50)

## 2020-04-13 NOTE — Care Management Important Message (Signed)
Important Message  Patient Details IM Letter given to the Patient Name: Tiffany Coleman MRN: [id number] Date of Birth: [birth year]   Medicare Important Message Given:  Yes     Kerin Salen 04/13/2020, 12:16 PM

## 2020-04-13 NOTE — Progress Notes (Signed)
PROGRESS NOTE  Tiffany Coleman  BCW:[id number] DOB: 01-15-1956 DOA: 04/05/2020 PCP: Nolene Ebbs, MD   Brief Narrative: Tiffany Coleman is a 64 y.o. female with a history of morbid obesity, OSA not on CPAP, GAD, GERD, and recent covid-19 diagnosis at PCP office who presented to the ED 8/23 having been found passed out and in respiratory distress at home. In the ED, she was febrile, tachycardic, tachypneic 78% on 15L NRB with hypoxia confirmed pO2 27 on VBG. CTA chest was performed showing no PE but did reveal extensive bilateral parenchymal infiltrates consistent with covid-19 pneumonia. Remdesivir, steroids, and baricitinib started and the patient was admitted to medicine service after consultation with PCCM.  Assessment & Plan: Active Problems:   Obstructive sleep apnea   Generalized anxiety disorder   Morbid obesity (HCC)   Hypertension   GERD (gastroesophageal reflux disease)   Acute hypoxemic respiratory failure due to COVID-19 [facility])   Acute hypoxemic respiratory failure due to covid-19 pneumonia:  - Covid positive PCR 8/16 with PCP, continue quarantine through 04/19/2020 - Patient has moderate to severe disease given comorbidities, body habitus and symptoms. She remains markedly noncompliant with respiratory support/BiPAP - Previously completed Remdesivir - Continue high-dose methylprednisone steroids through 04/14/2020, would likely benefit from prolonged steroid taper - Continue baricitinib through 9/5 or until hospital discharge.  - Encourage OOB, IS, FV, and awake proning if she will comply - Enoxaparin intermediate dosing, CTA chest showed no PE. SpO2: 93 % O2 Flow Rate (L/min): 15 L/min FiO2 (%): 100 % Recent Labs    04/11/20 0614 04/12/20 0235 04/13/20 0447  DDIMER 7.20* 5.69* 3.71*  CRP 5.7* 2.6* 1.3*   History of asthma: No current wheezing, likely not acute exacerbation.  - Continue BDs and steroids. - Likely complicating her respiratory status as  above  OSA: Noncompliant with CPAP at home, noncompliant here as well.  - Bipap HS and PRN per discussion with PCCM -patient was not placed on BiPAP overnight due to "protocol" despite being on BiPAP previously in the ICU  LFT elevation: Likely due to viral illness. Hepatic Function Latest Ref Rng & Units 04/13/2020 04/12/2020 04/11/2020  Total Protein 6.5 - 8.1 g/dL 6.5 6.2(L) 6.8  Albumin 3.5 - 5.0 g/dL 3.0(L) 2.7(L) 2.8(L)  AST 15 - 41 U/L 39 46(H) 37  ALT 0 - 44 U/L 86(H) 79(H) 73(H)  Alk Phosphatase 38 - 126 U/L 89 92 98  Total Bilirubin 0.3 - 1.2 mg/dL 0.7 0.7 0.6    AKI, without history of CKD, poa:  -Resolved. Continue monitoring Lab Results  Component Value Date   CREATININE 0.87 04/13/2020   CREATININE 0.90 04/12/2020   CREATININE 0.88 04/11/2020      Obesity: Estimated body mass index is 37.33 kg/m as calculated from the following:   Height as of this encounter: _0  (1.6 m).   Weight as of this encounter: 95.6 kg.  DVT prophylaxis: Lovenox Code Status: Full Family Communication: Daughter updated previously by phone, patient to update family today Disposition Plan:   Status is: Inpatient Remains inpatient appropriate because:Inpatient level of care appropriate due to severity of illness given ongoing severe hypoxia in the setting of COVID-19 pneumonia Dispo: The patient is from: Home               Anticipated d/c is to: Yet to be determined, pending clinical course               Anticipated d/c date is: > 3 days  Patient currently is not medically stable to d/c.  Consultants:   None  Procedures:   None  Antimicrobials:  Remdesivir   Subjective: No acute issues or events overnight, patient not placed on BiPAP overnight for unclear reasons, she otherwise indicates ongoing dyspnea with minimal exertion, fatigue weakness but insists she feels somewhat improved since admission.  We discussed at length the need for ongoing compliance with proning  early ambulation and out of bed as well as incentive spirometry or she will continue to have minimally improving symptoms and likely have very long and protracted hospitalization.  She denies nausea, vomiting, diarrhea, constipation, headache, fevers, chills.  Objective: Vitals:   04/12/20 2052 04/12/20 2209 04/13/20 0202 04/13/20 0639  BP: (!) 149/94 (!) 157/88 (!) 145/96 136/90  Pulse: 90 86 82 78  Resp: 20 (!) 22 (!) 22 (!) 22  Temp: 98.1 F (36.7 C) 98.1 F (36.7 C) 98.2 F (36.8 C) 98.4 F (36.9 C)  TempSrc: Oral Oral Oral Oral  SpO2: 96% 100% 94% 93%  Weight:      Height:        Intake/Output Summary (Last 24 hours) at 04/13/2020 0730 Last data filed at 04/13/2020 0400 Gross per 24 hour  Intake --  Output 2150 ml  Net -2150 ml   Filed Weights   04/07/20 1000 04/09/20 0550 04/12/20 1727  Weight: 98.9 kg 99.1 kg 95.6 kg   General:  Pleasantly resting in bed, No acute distress. HEENT:  Normocephalic atraumatic.  Sclerae nonicteric, noninjected.  Extraocular movements intact bilaterally. Neck:  Without mass or deformity.  Trachea is midline. Lungs: Markedly diminished breath sounds bilaterally, scant rhonchi bibasilarly without overt rales or wheeze Heart:  Regular rate and rhythm.  Without murmurs, rubs, or gallops. Abdomen:  Soft, nontender, nondistended.  Without guarding or rebound. Extremities: Without cyanosis, clubbing, edema, or obvious deformity. Vascular:  Dorsalis pedis and posterior tibial pulses palpable bilaterally. Skin:  Warm and dry, no erythema, no ulcerations.  Data Reviewed: I have personally reviewed following labs and imaging studies  CBC: Recent Labs  Lab 04/07/20 0146 04/07/20 0146 04/08/20 0157 04/08/20 0157 04/09/20 0236 04/10/20 0913 04/11/20 0614 04/12/20 0235 04/13/20 0447  WBC 4.8   < > 9.3   < > 10.0 11.2* 9.5 12.9* 11.9*  NEUTROABS 3.8  --  8.0*  --  8.9* 10.1*  --   --   --   HGB 11.5*   < > 11.7*   < > 11.7* 11.7* 12.1 11.6*  12.1  HCT 38.0   < > 37.8   < > 37.4 36.3 37.9 36.8 38.8  MCV 87.8   < > 86.3   < > 86.0 84.2 84.6 86.0 86.4  PLT 208   < > 233   < > 220 230 252 262 246   < > = values in this interval not displayed.   Basic Metabolic Panel: Recent Labs  Lab 04/09/20 0236 04/10/20 0913 04/11/20 0614 04/12/20 0235 04/13/20 0447  NA 138 136 136 136 136  K 4.5 4.4 4.4 5.1 5.1  CL 104 101 101 102 102  CO2 _0 GLUCOSE 142* 125* 142* 136* 150*  BUN 23 24* 28* 34* 34*  CREATININE 0.84 0.83 0.88 0.90 0.87  CALCIUM 8.7* 8.8* 9.1 9.3 9.4   GFR: Estimated Creatinine Clearance: 70 mL/min (by C-G formula based on SCr of 0.87 mg/dL). Liver Function Tests: Recent Labs  Lab 04/09/20 0236 04/10/20 5462 04/11/20 7035 04/12/20 0235 04/13/20 0447  AST 64* 44* 37 46* 39  ALT 96* 80* 73* 79* 86*  ALKPHOS 105 97 98 92 89  BILITOT 0.7 0.6 0.6 0.7 0.7  PROT 6.2* 6.4* 6.8 6.2* 6.5  ALBUMIN 2.8* 2.7* 2.8* 2.7* 3.0*   No results for input(s): LIPASE, AMYLASE in the last 168 hours. No results for input(s): AMMONIA in the last 168 hours. Coagulation Profile: No results for input(s): INR, PROTIME in the last 168 hours. Cardiac Enzymes: No results for input(s): CKTOTAL, CKMB, CKMBINDEX, TROPONINI in the last 168 hours. BNP (last 3 results) No results for input(s): PROBNP in the last 8760 hours. HbA1C: No results for input(s): HGBA1C in the last 72 hours. CBG: Recent Labs  Lab 04/12/20 1241 04/12/20 1634 04/12/20 2259 04/13/20 0034 04/13/20 0436  GLUCAP 110* 162* 145* 136* 168*   Lipid Profile: No results for input(s): CHOL, HDL, LDLCALC, TRIG, CHOLHDL, LDLDIRECT in the last 72 hours. Thyroid Function Tests: No results for input(s): TSH, T4TOTAL, FREET4, T3FREE, THYROIDAB in the last 72 hours. Anemia Panel: No results for input(s): VITAMINB12, FOLATE, FERRITIN, TIBC, IRON, RETICCTPCT in the last 72 hours. Urine analysis:    Component Value Date/Time   COLORURINE YELLOW 07/25/2019 1343    APPEARANCEUR CLEAR 07/25/2019 1343   APPEARANCEUR Cloudy (A) 04/25/2019 1132   LABSPEC 1.018 07/25/2019 1343   PHURINE 5.0 07/25/2019 1343   GLUCOSEU NEGATIVE 07/25/2019 1343   HGBUR NEGATIVE 07/25/2019 1343   BILIRUBINUR NEGATIVE 07/25/2019 1343   BILIRUBINUR Negative 04/25/2019 1132   KETONESUR NEGATIVE 07/25/2019 1343   PROTEINUR NEGATIVE 07/25/2019 1343   NITRITE NEGATIVE 07/25/2019 1343   LEUKOCYTESUR NEGATIVE 07/25/2019 1343   Recent Results (from the past 240 hour(s))  Blood Culture (routine x 2)     Status: None   Collection Time: 04/05/20  2:30 PM   Specimen: BLOOD LEFT FOREARM  Result Value Ref Range Status   Specimen Description   Final    BLOOD LEFT FOREARM Performed at [facility], [place] [address] [place], [place] [address]    Special Requests   Final    BOTTLES DRAWN AEROBIC AND ANAEROBIC Blood Culture adequate volume Performed at [place] [address] [place], [place] [address]    Culture   Final    NO GROWTH 5 DAYS Performed at [facility], [place] [address] [place], [place] [address]    Report Status 04/10/2020 FINAL  Final  SARS Coronavirus 2 by RT PCR (hospital order, performed in [facility] hospital lab) Nasopharyngeal Nasopharyngeal Swab     Status: Abnormal   Collection Time: 04/05/20  2:43 PM   Specimen: Nasopharyngeal Swab  Result Value Ref Range Status   SARS Coronavirus 2 POSITIVE (A) NEGATIVE Final    Comment: RESULT CALLED TO, READ BACK BY AND VERIFIED WITH: ZULETA, K. RN _0  04/05/20 BILLINGSLEY,L (NOTE) SARS-CoV-2 target nucleic acids are DETECTED  SARS-CoV-2 RNA is generally detectable in upper respiratory specimens  during the acute phase of infection.  Positive results are indicative  of the presence of the identified virus, but do not rule out bacterial infection or co-infection with other pathogens not detected by the test.  Clinical correlation with patient history and  other  diagnostic information is necessary to determine patient infection status.  The expected result is negative.  Fact Sheet for Patients:   StrictlyIdeas.no   Fact Sheet for Healthcare Providers:   BankingDealers.co.za    This test is not yet approved or cleared by the [place] FDA  and  has been authorized for detection and/or diagnosis of SARS-CoV-2 by FDA under an Emergency Use Authorization (EUA).  This EUA will remain in effect (meaning  this test can be used) for the duration of  the COVID-19 declaration under Section 564(b)(1) of the Act, 21 U.S.C. section 360-bbb-3(b)(1), unless the authorization is terminated or revoked sooner.  Performed at [facility], [place] [address] [place], [place] [address]   Blood Culture (routine x 2)     Status: None   Collection Time: 04/05/20  2:43 PM   Specimen: BLOOD  Result Value Ref Range Status   Specimen Description   Final    BLOOD LEFT ANTECUBITAL Performed at [place] [address], [place], [place] [address]    Special Requests   Final    BOTTLES DRAWN AEROBIC AND ANAEROBIC Blood Culture results may not be optimal due to an inadequate volume of blood received in culture bottles Performed at [place] [address], [place], [place] [address]    Culture   Final    NO GROWTH 5 DAYS Performed at [facility], [place] [address] [place], [place] [address]    Report Status 04/10/2020 FINAL  Final  MRSA PCR Screening     Status: None   Collection Time: 04/06/20  5:22 PM   Specimen: Nasal Mucosa; Nasopharyngeal  Result Value Ref Range Status   MRSA by PCR NEGATIVE NEGATIVE Final    Comment:        The GeneXpert MRSA Assay (FDA approved for NASAL specimens only), is one component of a comprehensive MRSA colonization surveillance program. It is not intended to diagnose MRSA infection nor to guide or monitor  treatment for MRSA infections. Performed at [facility], [place] [address], [place],  [address]      Radiology Studies: No results found.  Scheduled Meds: . (feeding supplement) PROSource Plus  30 mL Oral BID BM  . vitamin C  500 mg Oral Daily  . baricitinib  4 mg Oral Daily  . dextromethorphan-guaiFENesin  1 tablet Oral BID  . enoxaparin (LOVENOX) injection  50 mg Subcutaneous Q12H  . feeding supplement ([employer] STANDARD 1.4)  325 mL Oral Daily  . Ipratropium-Albuterol  1 puff Inhalation Q6H  . mouth rinse  15 mL Mouth Rinse BID  . methylPREDNISolone (SOLU-MEDROL) injection  80 mg Intravenous BID  . multivitamin with minerals  1 tablet Oral Daily  . oxymetazoline  1 spray Each Nare BID  . pantoprazole  40 mg Oral Daily  . zinc sulfate  220 mg Oral Daily   Continuous Infusions:   LOS: 8 days   Time spent: 35 minutes  Little Ishikawa, DO [facility] Secure chat for pager  04/13/2020, 7:30 AM

## 2020-04-13 NOTE — TOC Progression Note (Addendum)
Transition of Care [facility]) - Progression Note    Patient Details  Name: Tiffany Coleman MRN: [id number] Date of Birth: 05-03-1956  Transition of [facility] CM/SW Contact  Joaquin Courts, RN Phone Number: 04/13/2020, 3:37 PM  Clinical Narrative:   CM spoke with [facility] to see if patient would be a candidate.  Per [facility] rep, not a candidate due to noncompliance with treatment here, also has an FIo2 of 100% and will need to be 65% or less as well as 10 days out from positive covid test and afebrile.  [facility] reports they can offer bed and initiate auth.  MD notified.    Expected Discharge Plan: Home/Self Care Barriers to Discharge: Continued Medical Work up  Expected Discharge Plan and Services Expected Discharge Plan: Home/Self Care   Discharge Planning Services: CM Consult   Living arrangements for the past 2 months: Apartment                                       Social Determinants of Health (SDOH) Interventions    Readmission Risk Interventions No flowsheet data found.

## 2020-04-13 NOTE — Plan of Care (Signed)

## 2020-04-14 LAB — COMPREHENSIVE METABOLIC PANEL
ALT: 83 U/L — ABNORMAL HIGH (ref 0–44)
AST: 34 U/L (ref 15–41)
Albumin: 2.9 g/dL — ABNORMAL LOW (ref 3.5–5.0)
Alkaline Phosphatase: 76 U/L (ref 38–126)
Anion gap: 11 (ref 5–15)
BUN: 26 mg/dL — ABNORMAL HIGH (ref 8–23)
CO2: 24 mmol/L (ref 22–32)
Calcium: 9.7 mg/dL (ref 8.9–10.3)
Chloride: 99 mmol/L (ref 98–111)
Creatinine, Ser: 0.86 mg/dL (ref 0.44–1.00)
GFR calc Af Amer: >60 mL/min (ref >60)
GFR calc non Af Amer: >60 mL/min (ref >60)
Glucose, Bld: 133 mg/dL — ABNORMAL HIGH (ref 70–99)
Potassium: 5.1 mmol/L (ref 3.5–5.1)
Sodium: 134 mmol/L — ABNORMAL LOW (ref 135–145)
Total Bilirubin: 1 mg/dL (ref 0.3–1.2)
Total Protein: 6.8 g/dL (ref 6.5–8.1)

## 2020-04-14 LAB — CBC
HCT: 40.3 % (ref 36.0–46.0)
Hemoglobin: 12.6 g/dL (ref 12.0–15.0)
MCH: 27.2 pg (ref 26.0–34.0)
MCHC: 31.3 g/dL (ref 30.0–36.0)
MCV: 87 fL (ref 80.0–100.0)
Platelets: 275 10*3/uL (ref 150–400)
RBC: 4.63 MIL/uL (ref 3.87–5.11)
RDW: 14.6 % (ref 11.5–15.5)
WBC: 13.4 10*3/uL — ABNORMAL HIGH (ref 4.0–10.5)
nRBC: 0 % (ref 0.0–0.2)

## 2020-04-14 LAB — D-DIMER, QUANTITATIVE: D-Dimer, Quant: 3.22 ug/mL-FEU — ABNORMAL HIGH (ref 0.00–0.50)

## 2020-04-14 LAB — GLUCOSE, CAPILLARY
Glucose-Capillary: 137 mg/dL — ABNORMAL HIGH (ref 70–99)
Glucose-Capillary: 131 mg/dL — ABNORMAL HIGH (ref 70–99)
Glucose-Capillary: 123 mg/dL — ABNORMAL HIGH (ref 70–99)
Glucose-Capillary: 169 mg/dL — ABNORMAL HIGH (ref 70–99)
Glucose-Capillary: 214 mg/dL — ABNORMAL HIGH (ref 70–99)
Glucose-Capillary: 199 mg/dL — ABNORMAL HIGH (ref 70–99)

## 2020-04-14 LAB — C-REACTIVE PROTEIN: CRP: 0.7 mg/dL (ref <1.0)

## 2020-04-14 MED ORDER — KATE FARMS STANDARD 1.4 PO LIQD
325.0000 mL | Freq: Two times a day (BID) | ORAL | Status: DC
  Administered 2020-04-14 – 2020-04-20 (×7): 325 mL via ORAL
  Filled 2020-04-14 (×14): qty 325

## 2020-04-14 NOTE — Progress Notes (Signed)
Nutrition Follow-up  DOCUMENTATION CODES:   Obesity unspecified  INTERVENTION:  -Increase to [employer] 1.4 po BID, each supplement provides 455 kcal and 20 grams protein. -d/c Prosource PLUS -Continue MVI daily   NUTRITION DIAGNOSIS:   Increased nutrient needs related to acute illness, catabolic illness (EGBTD-17 PNA) as evidenced by estimated needs.  Ongoing  GOAL:   Patient will meet greater than or equal to 90% of their needs  Progressing  MONITOR:   PO intake, Supplement acceptance, Labs, Weight trends  REASON FOR ASSESSMENT:   Malnutrition Screening Tool    ASSESSMENT:   64 y.o. female with medical history of morbid obesity, OSA not on CPAP, GAD, GERD, and recent COVID-19 diagnosis at PCP office. She presented to the ED 8/23 having been found passed out and in respiratory distress at home. In the ED, she was febrile, tachycardic, tachypneic 78% on 15L NRB with hypoxia. CT angio chest showed no PE but did show extensive bilateral parenchymal infiltrates consistent with COVID-19 PNA. Remdesivir, steroids, and baricitinib started.  Per MD, pt's quarantine is to continue through 9/6. Pt reports ongoing dyspnea with minimal exertion, fatigue, and weakness, but insists she is feeling somewhat improved since admission. Pt denies N/V/D.  Per RN, pt has been refusing Prosource but is consuming [employer] well.   PO Intake: 50-100% x 4 recorded meals (87.5% average meal intake)  Labs: Na 134 (L) Medications: 62m Prosource PLUS BID, Vitamin C, [place] [employer] daily, Solu-medrol, MVI, Protonix, Zinc sulfate  Diet Order:   Diet Order            Diet Carb Modified Fluid consistency: Thin; Room service appropriate? Yes  Diet effective now                 EDUCATION NEEDS:   No education needs have been identified at this time  Skin:  Skin Assessment: Reviewed RN Assessment  Last BM:  8/27  Height:   Ht Readings from Last 1 Encounters:  04/12/20 _0  (1.6 m)     Weight:   Wt Readings from Last 1 Encounters:  04/12/20 95.6 kg    BMI:  Body mass index is 37.33 kg/m.  Estimated Nutritional Needs:   Kcal:  1880-2075 kcal  Protein:  100-110 grams  Fluid:  >/= 2 L/day    ALarkin Ina MS, RD, LDN RD pager number and weekend/on-call pager number located in [place]

## 2020-04-14 NOTE — Plan of Care (Signed)

## 2020-04-14 NOTE — Progress Notes (Signed)
PROGRESS NOTE    Tiffany Coleman  YTK:[id number] DOB: [birth year] DOA: 04/05/2020 PCP: Nolene Ebbs, MD    Brief Narrative:  Tiffany Coleman is a 64 y.o. female with a history of morbid obesity, OSA not on CPAP, GAD, GERD, and recent covid-19 diagnosis at PCP office who presented to the ED 8/23 having been found passed out and in respiratory distress at home. In the ED, she was febrile, tachycardic, tachypneic 78% on 15L NRB with hypoxia confirmed pO2 27 on VBG. CTA chest was performed showing no PE but did reveal extensive bilateral parenchymal infiltrates consistent with covid-19 pneumonia. Remdesivir, steroids, and baricitinib started and the patient was admitted to medicine service after consultation with PCCM.   Assessment & Plan:   Active Problems:   Obstructive sleep apnea   Generalized anxiety disorder   Morbid obesity (HCC)   Hypertension   GERD (gastroesophageal reflux disease)   Acute hypoxemic respiratory failure due to COVID-19 [facility])  Acute hypoxemic respiratory failure due to COVID-19 pneumonia: Continue to monitor due to significant symptoms  chest physiotherapy, incentive spirometry, deep breathing exercises, sputum induction, mucolytic's and bronchodilators. Supplemental oxygen to keep saturations more than 85 %. Covid directed therapy with , steroids, high-dose Solu-Medrol, will taper off when her oxygen requirement improves. remdesivir, completed 5 days of therapy Baricitinib, started 8/25, will continue for total 14 days or hospital discharge.   Due to severity of symptoms, patient will need daily inflammatory markers, chest x-rays, liver function test to monitor and direct COVID-19 therapies.  COVID-19 Labs  Recent Labs    04/12/20 0235 04/13/20 0447 04/14/20 0447  DDIMER 5.69* 3.71* 3.22*  CRP 2.6* 1.3* 0.7    Lab Results  Component Value Date   SARSCOV2NAA POSITIVE (A) 04/05/2020   SpO2: 98 % O2 Flow Rate (L/min): 15 L/min FiO2  (%): 100 %  Mild intermittent asthma: stable   Obstructive sleep apnea, not using CPAP at home  Acute kidney injury: Improved.  Abnormal LFT: Improving.  Hepatic Function Latest Ref Rng & Units 04/14/2020 04/13/2020 04/12/2020  Total Protein 6.5 - 8.1 g/dL 6.8 6.5 6.2(L)  Albumin 3.5 - 5.0 g/dL 2.9(L) 3.0(L) 2.7(L)  AST 15 - 41 U/L 34 39 46(H)  ALT 0 - 44 U/L 83(H) 86(H) 79(H)  Alk Phosphatase 38 - 126 U/L 76 89 92  Total Bilirubin 0.3 - 1.2 mg/dL 1.0 0.7 0.7     DVT prophylaxis: Lovenox subcu   Code Status: full code  Family Communication: daughter on the phone  Disposition Plan: Status is: Inpatient  Remains inpatient appropriate because:Inpatient level of care appropriate due to severity of illness   Dispo: The patient is from: Home              Anticipated d/c is to: LTAC              Anticipated d/c date is: 3 days              Patient currently is not medically stable to d/c.         Consultants:   None  Procedures:   None  Antimicrobials:  Antibiotics Given (last 72 hours)    None         Subjective: Patient seen and examined.  Poor historian.  She thinks she is doing well.  Is still on 15 L of oxygen.  Objective: Vitals:   04/13/20 1336 04/13/20 2037 04/14/20 0249 04/14/20 0538  BP: 121/83 (!) 144/84  121/80  Pulse: 87 92  70 93  Resp: _0 Temp: 97.9 F (36.6 C) 98.1 F (36.7 C)  97.7 F (36.5 C)  TempSrc: Oral Oral  Axillary  SpO2: 97% 91% 94% 98%  Weight:      Height:        Intake/Output Summary (Last 24 hours) at 04/14/2020 1332 Last data filed at 04/14/2020 0934 Gross per 24 hour  Intake 480 ml  Output 1200 ml  Net -720 ml   Filed Weights   04/07/20 1000 04/09/20 0550 04/12/20 1727  Weight: 98.9 kg 99.1 kg 95.6 kg    Examination:  General exam: Appears calm and comfortable  Chronically sick looking.  Not in any distress. Respiratory system: Clear to auscultation. Respiratory effort normal.  No added  sounds. Cardiovascular system: S1 & S2 heard, RRR. No JVD, murmurs, rubs, gallops or clicks. No pedal edema. Gastrointestinal system: Abdomen is nondistended, soft and nontender. No organomegaly or masses felt. Normal bowel sounds heard. Central nervous system: Alert and oriented. No focal neurological deficits.   Data Reviewed: I have personally reviewed following labs and imaging studies  CBC: Recent Labs  Lab 04/08/20 0157 04/08/20 0157 04/09/20 0236 04/09/20 0236 04/10/20 0913 04/11/20 5790 04/12/20 0235 04/13/20 0447 04/14/20 0447  WBC 9.3   < > 10.0   < > 11.2* 9.5 12.9* 11.9* 13.4*  NEUTROABS 8.0*  --  8.9*  --  10.1*  --   --   --   --   HGB 11.7*   < > 11.7*   < > 11.7* 12.1 11.6* 12.1 12.6  HCT 37.8   < > 37.4   < > 36.3 37.9 36.8 38.8 40.3  MCV 86.3   < > 86.0   < > 84.2 84.6 86.0 86.4 87.0  PLT 233   < > 220   < > 230 252 262 246 275   < > = values in this interval not displayed.   Basic Metabolic Panel: Recent Labs  Lab 04/10/20 0913 04/11/20 0614 04/12/20 0235 04/13/20 0447 04/14/20 0447  NA 136 136 136 136 134*  K 4.4 4.4 5.1 5.1 5.1  CL 101 101 102 102 99  CO2 _1 GLUCOSE 125* 142* 136* 150* 133*  BUN 24* 28* 34* 34* 26*  CREATININE 0.83 0.88 0.90 0.87 0.86  CALCIUM 8.8* 9.1 9.3 9.4 9.7   GFR: Estimated Creatinine Clearance: 70.8 mL/min (by C-G formula based on SCr of 0.86 mg/dL). Liver Function Tests: Recent Labs  Lab 04/10/20 0913 04/11/20 3833 04/12/20 0235 04/13/20 0447 04/14/20 0447  AST 44* 37 46* 39 34  ALT 80* 73* 79* 86* 83*  ALKPHOS 97 98 92 89 76  BILITOT 0.6 0.6 0.7 0.7 1.0  PROT 6.4* 6.8 6.2* 6.5 6.8  ALBUMIN 2.7* 2.8* 2.7* 3.0* 2.9*   No results for input(s): LIPASE, AMYLASE in the last 168 hours. No results for input(s): AMMONIA in the last 168 hours. Coagulation Profile: No results for input(s): INR, PROTIME in the last 168 hours. Cardiac Enzymes: No results for input(s): CKTOTAL, CKMB, CKMBINDEX, TROPONINI  in the last 168 hours. BNP (last 3 results) No results for input(s): PROBNP in the last 8760 hours. HbA1C: No results for input(s): HGBA1C in the last 72 hours. CBG: Recent Labs  Lab 04/13/20 2033 04/13/20 2344 04/14/20 0542 04/14/20 0734 04/14/20 1157  GLUCAP 134* 201* 137* 131* 123*   Lipid Profile: No results for input(s): CHOL, HDL, LDLCALC, TRIG, CHOLHDL, LDLDIRECT in the last  72 hours. Thyroid Function Tests: No results for input(s): TSH, T4TOTAL, FREET4, T3FREE, THYROIDAB in the last 72 hours. Anemia Panel: No results for input(s): VITAMINB12, FOLATE, FERRITIN, TIBC, IRON, RETICCTPCT in the last 72 hours. Sepsis Labs: Recent Labs  Lab 04/08/20 0157  PROCALCITON 0.16    Recent Results (from the past 240 hour(s))  Blood Culture (routine x 2)     Status: None   Collection Time: 04/05/20  2:30 PM   Specimen: BLOOD LEFT FOREARM  Result Value Ref Range Status   Specimen Description   Final    BLOOD LEFT FOREARM Performed at [facility] [address] [place], [place] [address]    Special Requests   Final    BOTTLES DRAWN AEROBIC AND ANAEROBIC Blood Culture adequate volume Performed at [facility] [address] [place], [place] [address]    Culture   Final    NO GROWTH 5 DAYS Performed at [facility], [place] [address], [place], [place] [address]    Report Status 04/10/2020 FINAL  Final  SARS Coronavirus 2 by RT PCR (hospital order, performed in [facility] hospital lab) Nasopharyngeal Nasopharyngeal Swab     Status: Abnormal   Collection Time: 04/05/20  2:43 PM   Specimen: Nasopharyngeal Swab  Result Value Ref Range Status   SARS Coronavirus 2 POSITIVE (A) NEGATIVE Final    Comment: RESULT CALLED TO, READ BACK BY AND VERIFIED WITH: ZULETA, K. RN _0  04/05/20 BILLINGSLEY,L (NOTE) SARS-CoV-2 target nucleic acids are DETECTED  SARS-CoV-2 RNA is generally detectable in upper respiratory specimens  during the acute  phase of infection.  Positive results are indicative  of the presence of the identified virus, but do not rule out bacterial infection or co-infection with other pathogens not detected by the test.  Clinical correlation with patient history and  other diagnostic information is necessary to determine patient infection status.  The expected result is negative.  Fact Sheet for Patients:   StrictlyIdeas.no   Fact Sheet for Healthcare Providers:   BankingDealers.co.za    This test is not yet approved or cleared by the [place] FDA and  has been authorized for detection and/or diagnosis of SARS-CoV-2 by FDA under an Emergency Use Authorization (EUA).  This EUA will remain in effect (meaning  this test can be used) for the duration of  the COVID-19 declaration under Section 564(b)(1) of the Act, 21 U.S.C. section 360-bbb-3(b)(1), unless the authorization is terminated or revoked sooner.  Performed at [facility], [place] [address], [place], [place] [address]   Blood Culture (routine x 2)     Status: None   Collection Time: 04/05/20  2:43 PM   Specimen: BLOOD  Result Value Ref Range Status   Specimen Description   Final    BLOOD LEFT ANTECUBITAL Performed at [facility] [address], [place], [place] [address]    Special Requests   Final    BOTTLES DRAWN AEROBIC AND ANAEROBIC Blood Culture results may not be optimal due to an inadequate volume of blood received in culture bottles Performed at [facility] [address], [place], [place] [address]    Culture   Final    NO GROWTH 5 DAYS Performed at [facility], [place] [address], [place],  [address]    Report Status 04/10/2020 FINAL  Final  MRSA PCR Screening     Status: None   Collection Time: 04/06/20  5:22 PM  Specimen: Nasal Mucosa; Nasopharyngeal  Result Value Ref Range Status   MRSA by PCR NEGATIVE  NEGATIVE Final    Comment:        The GeneXpert MRSA Assay (FDA approved for NASAL specimens only), is one component of a comprehensive MRSA colonization surveillance program. It is not intended to diagnose MRSA infection nor to guide or monitor treatment for MRSA infections. Performed at [facility], [place] [address], [place], [place] [address]          Radiology Studies: No results found.      Scheduled Meds: . vitamin C  500 mg Oral Daily  . baricitinib  4 mg Oral Daily  . dextromethorphan-guaiFENesin  1 tablet Oral BID  . enoxaparin (LOVENOX) injection  50 mg Subcutaneous Q12H  . feeding supplement (KATE FARMS STANDARD 1.4)  325 mL Oral BID BM  . Ipratropium-Albuterol  1 puff Inhalation Q6H  . mouth rinse  15 mL Mouth Rinse BID  . methylPREDNISolone (SOLU-MEDROL) injection  80 mg Intravenous BID  . multivitamin with minerals  1 tablet Oral Daily  . oxymetazoline  1 spray Each Nare BID  . pantoprazole  40 mg Oral Daily  . zinc sulfate  220 mg Oral Daily   Continuous Infusions:   LOS: 9 days    Time spent: 35 minutes    Barb Merino, MD [facility] Pager [phone]

## 2020-04-15 LAB — COMPREHENSIVE METABOLIC PANEL
ALT: 78 U/L — ABNORMAL HIGH (ref 0–44)
AST: 26 U/L (ref 15–41)
Albumin: 2.9 g/dL — ABNORMAL LOW (ref 3.5–5.0)
Alkaline Phosphatase: 78 U/L (ref 38–126)
Anion gap: 9 (ref 5–15)
BUN: 29 mg/dL — ABNORMAL HIGH (ref 8–23)
CO2: 26 mmol/L (ref 22–32)
Calcium: 9.7 mg/dL (ref 8.9–10.3)
Chloride: 100 mmol/L (ref 98–111)
Creatinine, Ser: 0.87 mg/dL (ref 0.44–1.00)
GFR calc Af Amer: >60 mL/min (ref >60)
GFR calc non Af Amer: >60 mL/min (ref >60)
Glucose, Bld: 210 mg/dL — ABNORMAL HIGH (ref 70–99)
Potassium: 4.6 mmol/L (ref 3.5–5.1)
Sodium: 135 mmol/L (ref 135–145)
Total Bilirubin: 0.7 mg/dL (ref 0.3–1.2)
Total Protein: 6.6 g/dL (ref 6.5–8.1)

## 2020-04-15 LAB — CBC
HCT: 40.9 % (ref 36.0–46.0)
Hemoglobin: 12.5 g/dL (ref 12.0–15.0)
MCH: 26.8 pg (ref 26.0–34.0)
MCHC: 30.6 g/dL (ref 30.0–36.0)
MCV: 87.8 fL (ref 80.0–100.0)
Platelets: 260 10*3/uL (ref 150–400)
RBC: 4.66 MIL/uL (ref 3.87–5.11)
RDW: 14.5 % (ref 11.5–15.5)
WBC: 10.9 10*3/uL — ABNORMAL HIGH (ref 4.0–10.5)
nRBC: 0 % (ref 0.0–0.2)

## 2020-04-15 LAB — GLUCOSE, CAPILLARY
Glucose-Capillary: 138 mg/dL — ABNORMAL HIGH (ref 70–99)
Glucose-Capillary: 138 mg/dL — ABNORMAL HIGH (ref 70–99)
Glucose-Capillary: 160 mg/dL — ABNORMAL HIGH (ref 70–99)
Glucose-Capillary: 166 mg/dL — ABNORMAL HIGH (ref 70–99)
Glucose-Capillary: 188 mg/dL — ABNORMAL HIGH (ref 70–99)
Glucose-Capillary: 192 mg/dL — ABNORMAL HIGH (ref 70–99)

## 2020-04-15 LAB — D-DIMER, QUANTITATIVE: D-Dimer, Quant: 3.17 ug/mL-FEU — ABNORMAL HIGH (ref 0.00–0.50)

## 2020-04-15 LAB — C-REACTIVE PROTEIN: CRP: 2.4 mg/dL — ABNORMAL HIGH (ref <1.0)

## 2020-04-15 MED ORDER — NYSTATIN 100000 UNIT/ML MT SUSP
5.0000 mL | Freq: Four times a day (QID) | OROMUCOSAL | Status: DC
  Administered 2020-04-15 – 2020-05-07 (×86): 500000 [IU] via ORAL
  Filled 2020-04-15 (×85): qty 5

## 2020-04-15 NOTE — Progress Notes (Signed)
PROGRESS NOTE    [name]  WUJ:[id number] DOB: [birth year] DOA: 04/05/2020 PCP: Nolene Ebbs, MD    Brief Narrative:  Tiffany Coleman is a 64 y.o. female with a history of morbid obesity, OSA not on CPAP, GAD, GERD, and recent covid-19 diagnosis at PCP office who presented to the ED 8/23 having been found passed out and in respiratory distress at home. In the ED, she was febrile, tachycardic, tachypneic 78% on 15L NRB with hypoxia confirmed pO2 27 on VBG. CTA chest was performed showing no PE but did reveal extensive bilateral parenchymal infiltrates consistent with covid-19 pneumonia. Remdesivir, steroids, and baricitinib started and the patient was admitted to medicine service after consultation with PCCM.   Assessment & Plan:   Active Problems:   Obstructive sleep apnea   Generalized anxiety disorder   Morbid obesity (HCC)   Hypertension   GERD (gastroesophageal reflux disease)   Acute hypoxemic respiratory failure due to COVID-19 [facility])  Acute hypoxemic respiratory failure due to COVID-19 pneumonia: Continue to monitor due to significant symptoms  chest physiotherapy, incentive spirometry, deep breathing exercises, sputum induction, mucolytic's and bronchodilators. Supplemental oxygen to keep saturations more than 85 %. Covid directed therapy with , steroids, high-dose Solu-Medrol, will taper off when her oxygen requirement improves. remdesivir, completed 5 days of therapy Baricitinib, started 8/25, will continue for total 14 days or hospital discharge.   Due to severity of symptoms, patient will need daily inflammatory markers, chest x-rays, liver function test to monitor and direct COVID-19 therapies.  COVID-19 Labs  Recent Labs    04/13/20 0447 04/14/20 0447 04/15/20 0445  DDIMER 3.71* 3.22* 3.17*  CRP 1.3* 0.7 2.4*    Lab Results  Component Value Date   SARSCOV2NAA POSITIVE (A) 04/05/2020   SpO2: 96 % O2 Flow Rate (L/min): 15 L/min (plus  15 lpm PRB  Simultaneous filing. User may not have seen previous data.) FiO2 (%): 94 % (decreased HFNC to 12 LPM)  Mild intermittent asthma: stable   Obstructive sleep apnea, not using CPAP at home.  Complicating oxygen requirement.  Acute kidney injury: Improved.  Abnormal LFT: Improving.  Hepatic Function Latest Ref Rng & Units 04/15/2020 04/14/2020 04/13/2020  Total Protein 6.5 - 8.1 g/dL 6.6 6.8 6.5  Albumin 3.5 - 5.0 g/dL 2.9(L) 2.9(L) 3.0(L)  AST 15 - 41 U/L 26 34 39  ALT 0 - 44 U/L 78(H) 83(H) 86(H)  Alk Phosphatase 38 - 126 U/L 78 76 89  Total Bilirubin 0.3 - 1.2 mg/dL 0.7 1.0 0.7     DVT prophylaxis: Lovenox subcu   Code Status: full code  Family Communication: daughter on the phone 9/1 Disposition Plan: Status is: Inpatient  Remains inpatient appropriate because:Inpatient level of care appropriate due to severity of illness   Dispo: The patient is from: Home              Anticipated d/c is to: LTAC denied by insurance company.  Able to go to a skilled nursing facility when oxygenation improves to 6 L or less.              Anticipated d/c date is: 3 days              Patient currently is not medically stable to d/c.  A peer to peer review was done with insurance provider to help patient go to LTAC to continue to wean off from oxygen as well as to get inpatient therapies, however insurance company denied.  She is a still  on 12 to 15 L of oxygen and not safe to discharge.  Skilled nursing facility will not take her until she is less than 6 L of oxygen.       Consultants:   None  Procedures:   None  Antimicrobials:  Antibiotics Given (last 72 hours)    None         Subjective: Seen and examined.  Still short of breath, however she is putting all her efforts to mobilize and do breathing exercises. Remains afebrile.  Objective: Vitals:   04/15/20 0410 04/15/20 1042 04/15/20 1044 04/15/20 1304  BP: 128/80   123/71  Pulse: 89 87  (!) 102  Resp: 20 20   (!) 26  Temp: 97.7 F (36.5 C)   98 F (36.7 C)  TempSrc: Axillary   Oral  SpO2: 97% 94% 93% 96%  Weight:      Height:        Intake/Output Summary (Last 24 hours) at 04/15/2020 1310 Last data filed at 04/15/2020 0416 Gross per 24 hour  Intake 120 ml  Output 1450 ml  Net -1330 ml   Filed Weights   04/07/20 1000 04/09/20 0550 04/12/20 1727  Weight: 98.9 kg 99.1 kg 95.6 kg    Examination:  General exam: Appears calm and comfortable  Chronically sick looking.  In mild distress.  Anxious. Respiratory system: Clear to auscultation. Respiratory effort normal.  No added sounds. Cardiovascular system: S1 & S2 heard, RRR. No JVD, murmurs, rubs, gallops or clicks. No pedal edema. Gastrointestinal system: Abdomen is nondistended, soft and nontender. No organomegaly or masses felt. Normal bowel sounds heard. Central nervous system: Alert and oriented. No focal neurological deficits.   Data Reviewed: I have personally reviewed following labs and imaging studies  CBC: Recent Labs  Lab 04/09/20 0236 04/09/20 0236 04/10/20 0913 04/10/20 0913 04/11/20 2831 04/12/20 0235 04/13/20 0447 04/14/20 0447 04/15/20 0445  WBC 10.0   < > 11.2*   < > 9.5 12.9* 11.9* 13.4* 10.9*  NEUTROABS 8.9*  --  10.1*  --   --   --   --   --   --   HGB 11.7*   < > 11.7*   < > 12.1 11.6* 12.1 12.6 12.5  HCT 37.4   < > 36.3   < > 37.9 36.8 38.8 40.3 40.9  MCV 86.0   < > 84.2   < > 84.6 86.0 86.4 87.0 87.8  PLT 220   < > 230   < > 252 262 246 275 260   < > = values in this interval not displayed.   Basic Metabolic Panel: Recent Labs  Lab 04/11/20 0614 04/12/20 0235 04/13/20 0447 04/14/20 0447 04/15/20 0445  NA 136 136 136 134* 135  K 4.4 5.1 5.1 5.1 4.6  CL 101 102 102 99 100  CO2 _0 GLUCOSE 142* 136* 150* 133* 210*  BUN 28* 34* 34* 26* 29*  CREATININE 0.88 0.90 0.87 0.86 0.87  CALCIUM 9.1 9.3 9.4 9.7 9.7   GFR: Estimated Creatinine Clearance: 70 mL/min (by C-G formula based on SCr  of 0.87 mg/dL). Liver Function Tests: Recent Labs  Lab 04/11/20 0614 04/12/20 0235 04/13/20 0447 04/14/20 0447 04/15/20 0445  AST 37 46* 39 34 26  ALT 73* 79* 86* 83* 78*  ALKPHOS 98 92 89 76 78  BILITOT 0.6 0.7 0.7 1.0 0.7  PROT 6.8 6.2* 6.5 6.8 6.6  ALBUMIN 2.8* 2.7* 3.0* 2.9* 2.9*  No results for input(s): LIPASE, AMYLASE in the last 168 hours. No results for input(s): AMMONIA in the last 168 hours. Coagulation Profile: No results for input(s): INR, PROTIME in the last 168 hours. Cardiac Enzymes: No results for input(s): CKTOTAL, CKMB, CKMBINDEX, TROPONINI in the last 168 hours. BNP (last 3 results) No results for input(s): PROBNP in the last 8760 hours. HbA1C: No results for input(s): HGBA1C in the last 72 hours. CBG: Recent Labs  Lab 04/14/20 2014 04/15/20 0015 04/15/20 0413 04/15/20 0804 04/15/20 1116  GLUCAP 199* 138* 160* 166* 138*   Lipid Profile: No results for input(s): CHOL, HDL, LDLCALC, TRIG, CHOLHDL, LDLDIRECT in the last 72 hours. Thyroid Function Tests: No results for input(s): TSH, T4TOTAL, FREET4, T3FREE, THYROIDAB in the last 72 hours. Anemia Panel: No results for input(s): VITAMINB12, FOLATE, FERRITIN, TIBC, IRON, RETICCTPCT in the last 72 hours. Sepsis Labs: No results for input(s): PROCALCITON, LATICACIDVEN in the last 168 hours.  Recent Results (from the past 240 hour(s))  Blood Culture (routine x 2)     Status: None   Collection Time: 04/05/20  2:30 PM   Specimen: BLOOD LEFT FOREARM  Result Value Ref Range Status   Specimen Description   Final    BLOOD LEFT FOREARM Performed at [facility] [address], [place], [place] [address]    Special Requests   Final    BOTTLES DRAWN AEROBIC AND ANAEROBIC Blood Culture adequate volume Performed at [facility] [address], [place], [place] [address]    Culture   Final    NO GROWTH 5 DAYS Performed at [facility], [place] [address],  [place], [place] [address]    Report Status 04/10/2020 FINAL  Final  SARS Coronavirus 2 by RT PCR (hospital order, performed in [facility] hospital lab) Nasopharyngeal Nasopharyngeal Swab     Status: Abnormal   Collection Time: 04/05/20  2:43 PM   Specimen: Nasopharyngeal Swab  Result Value Ref Range Status   SARS Coronavirus 2 POSITIVE (A) NEGATIVE Final    Comment: RESULT CALLED TO, READ BACK BY AND VERIFIED WITH: ZULETA, K. RN _0  04/05/20 BILLINGSLEY,L (NOTE) SARS-CoV-2 target nucleic acids are DETECTED  SARS-CoV-2 RNA is generally detectable in upper respiratory specimens  during the acute phase of infection.  Positive results are indicative  of the presence of the identified virus, but do not rule out bacterial infection or co-infection with other pathogens not detected by the test.  Clinical correlation with patient history and  other diagnostic information is necessary to determine patient infection status.  The expected result is negative.  Fact Sheet for Patients:   StrictlyIdeas.no   Fact Sheet for Healthcare Providers:   BankingDealers.co.za    This test is not yet approved or cleared by the [place] FDA and  has been authorized for detection and/or diagnosis of SARS-CoV-2 by FDA under an Emergency Use Authorization (EUA).  This EUA will remain in effect (meaning  this test can be used) for the duration of  the COVID-19 declaration under Section 564(b)(1) of the Act, 21 U.S.C. section 360-bbb-3(b)(1), unless the authorization is terminated or revoked sooner.  Performed at [facility], [place] [address] [place], [place] [address]   Blood Culture (routine x 2)     Status: None   Collection Time: 04/05/20  2:43 PM   Specimen: BLOOD  Result Value Ref Range Status   Specimen Description   Final    BLOOD LEFT ANTECUBITAL Performed at  [facility], [place] [address] [place], [place]  [address]    Special Requests   Final    BOTTLES DRAWN AEROBIC AND ANAEROBIC Blood Culture results may not be optimal due to an inadequate volume of blood received in culture bottles Performed at [facility] [address] [place], [place] [address]    Culture   Final    NO GROWTH 5 DAYS Performed at [facility], [place] [address], [place], [place] [address]    Report Status 04/10/2020 FINAL  Final  MRSA PCR Screening     Status: None   Collection Time: 04/06/20  5:22 PM   Specimen: Nasal Mucosa; Nasopharyngeal  Result Value Ref Range Status   MRSA by PCR NEGATIVE NEGATIVE Final    Comment:        The GeneXpert MRSA Assay (FDA approved for NASAL specimens only), is one component of a comprehensive MRSA colonization surveillance program. It is not intended to diagnose MRSA infection nor to guide or monitor treatment for MRSA infections. Performed at [facility], [place] [address] [place], [place] [address]          Radiology Studies: No results found.      Scheduled Meds:  vitamin C  500 mg Oral Daily   baricitinib  4 mg Oral Daily   dextromethorphan-guaiFENesin  1 tablet Oral BID   enoxaparin (LOVENOX) injection  50 mg Subcutaneous Q12H   feeding supplement (KATE FARMS STANDARD 1.4)  325 mL Oral BID BM   Ipratropium-Albuterol  1 puff Inhalation Q6H   mouth rinse  15 mL Mouth Rinse BID   methylPREDNISolone (SOLU-MEDROL) injection  80 mg Intravenous BID   multivitamin with minerals  1 tablet Oral Daily   nystatin  5 mL Oral QID   oxymetazoline  1 spray Each Nare BID   pantoprazole  40 mg Oral Daily   zinc sulfate  220 mg Oral Daily   Continuous Infusions:   LOS: 10 days    Time spent: 35 minutes    Barb Merino, MD [facility] Pager [phone]

## 2020-04-15 NOTE — TOC Progression Note (Signed)
Transition of Care [facility]) - Progression Note    Patient Details  Name: Tiffany Coleman MRN: [id number] Date of Birth: 01/05/1956  Transition of Care [facility] CM/SW Contact  Purcell Mouton, RN Phone Number: 04/15/2020, 12:54 PM  Clinical Narrative:    [facility] insurance denied pt going to LTAC related to pt not being on 20-60 L of O2 and needing LTAC for 25 days. Will continue to follow for  Needs.   Expected Discharge Plan: Home/Self Care Barriers to Discharge: Continued Medical Work up  Expected Discharge Plan and Services Expected Discharge Plan: Home/Self Care   Discharge Planning Services: CM Consult   Living arrangements for the past 2 months: Apartment                                       Social Determinants of Health (SDOH) Interventions    Readmission Risk Interventions No flowsheet data found.

## 2020-04-15 NOTE — Progress Notes (Signed)
   04/15/20 1304  Assess: MEWS Score  Temp 98 F (36.7 C)  BP 123/71  Pulse Rate (!) 102  Resp (!) 26  SpO2 96 %  Assess: MEWS Score  MEWS Temp 0  MEWS Systolic 0  MEWS Pulse 1  MEWS RR 2  MEWS LOC 0  MEWS Score 3  MEWS Score Color Yellow  Assess: if the MEWS score is Yellow or Red  Were vital signs taken at a resting state? Yes  Focused Assessment Change from prior assessment (see assessment flowsheet)  Early Detection of Sepsis Score *See Row Information* Low  MEWS guidelines implemented *See Row Information* Yes  Treat  MEWS Interventions Other (Comment) (notified CN)

## 2020-04-16 LAB — GLUCOSE, CAPILLARY
Glucose-Capillary: 130 mg/dL — ABNORMAL HIGH (ref 70–99)
Glucose-Capillary: 139 mg/dL — ABNORMAL HIGH (ref 70–99)
Glucose-Capillary: 143 mg/dL — ABNORMAL HIGH (ref 70–99)
Glucose-Capillary: 160 mg/dL — ABNORMAL HIGH (ref 70–99)
Glucose-Capillary: 180 mg/dL — ABNORMAL HIGH (ref 70–99)
Glucose-Capillary: 192 mg/dL — ABNORMAL HIGH (ref 70–99)
Glucose-Capillary: 202 mg/dL — ABNORMAL HIGH (ref 70–99)

## 2020-04-16 LAB — D-DIMER, QUANTITATIVE: D-Dimer, Quant: 3 ug/mL-FEU — ABNORMAL HIGH (ref 0.00–0.50)

## 2020-04-16 LAB — C-REACTIVE PROTEIN: CRP: 1.1 mg/dL — ABNORMAL HIGH (ref <1.0)

## 2020-04-16 NOTE — Plan of Care (Signed)
  Problem: Coping: °Goal: Psychosocial and spiritual needs will be supported °Outcome: Progressing °  °Problem: Respiratory: °Goal: Will maintain a patent airway °Outcome: Progressing °Goal: Complications related to the disease process, condition or treatment will be avoided or minimized °Outcome: Progressing °  °Problem: Health Behavior/Discharge Planning: °Goal: Ability to manage health-related needs will improve °Outcome: Progressing °  °Problem: Clinical Measurements: °Goal: Ability to maintain clinical measurements within normal limits will improve °Outcome: Progressing °Goal: Will remain free from infection °Outcome: Progressing °Goal: Diagnostic test results will improve °Outcome: Progressing °Goal: Respiratory complications will improve °Outcome: Progressing °Goal: Cardiovascular complication will be avoided °Outcome: Progressing °  °Problem: Activity: °Goal: Risk for activity intolerance will decrease °Outcome: Progressing °  °Problem: Coping: °Goal: Level of anxiety will decrease °Outcome: Progressing °  °Problem: Elimination: °Goal: Will not experience complications related to bowel motility °Outcome: Progressing °Goal: Will not experience complications related to urinary retention °Outcome: Progressing °  °Problem: Pain Managment: °Goal: General experience of comfort will improve °Outcome: Progressing °  °Problem: Safety: °Goal: Ability to remain free from injury will improve °Outcome: Progressing °  °Problem: Skin Integrity: °Goal: Risk for impaired skin integrity will decrease °Outcome: Progressing °  °

## 2020-04-16 NOTE — Progress Notes (Signed)
Attempted to wean PT 02 to [place] 12 LPM with no PRB- desaturation <80%. Back to 9 LPM [place] and PRB.

## 2020-04-16 NOTE — Progress Notes (Signed)
PROGRESS NOTE    Tiffany Coleman  QGB:[id number] DOB: 06/03/1956 DOA: 04/05/2020 PCP: Nolene Ebbs, MD    Brief Narrative:  Tiffany Coleman is a 64 y.o. female with a history of morbid obesity, OSA not on CPAP, GAD, GERD, and recent covid-19 diagnosis at PCP office who presented to the ED 8/23 having been found passed out and in respiratory distress at home. In the ED, she was febrile, tachycardic, tachypneic 78% on 15L NRB with hypoxia confirmed pO2 27 on VBG. CTA chest was performed showing no PE but did reveal extensive bilateral parenchymal infiltrates consistent with covid-19 pneumonia. Remdesivir, steroids, and baricitinib started and the patient was admitted to medicine service after consultation with PCCM.   Assessment & Plan:   Active Problems:   Obstructive sleep apnea   Generalized anxiety disorder   Morbid obesity (HCC)   Hypertension   GERD (gastroesophageal reflux disease)   Acute hypoxemic respiratory failure due to COVID-19 [facility])  Acute hypoxemic respiratory failure due to COVID-19 pneumonia: Continue to monitor due to significant symptoms  chest physiotherapy, incentive spirometry, deep breathing exercises, sputum induction, mucolytic's and bronchodilators. Supplemental oxygen to keep saturations more than 85 %. Covid directed therapy with , steroids, high-dose Solu-Medrol, will taper off when her oxygen requirement improves. remdesivir, completed 5 days of therapy Baricitinib, started 8/25, will continue for total 14 days or hospital discharge.   Due to severity of symptoms, patient will need daily inflammatory markers, chest x-rays, liver function test to monitor and direct COVID-19 therapies.  COVID-19 Labs  Recent Labs    04/14/20 0447 04/15/20 0445 04/16/20 0447  DDIMER 3.22* 3.17* 3.00*  CRP 0.7 2.4* 1.1*    Lab Results  Component Value Date   SARSCOV2NAA POSITIVE (A) 04/05/2020   SpO2: 95 % O2 Flow Rate (L/min): 9 L/min FiO2  (%): 94 % (decreased HFNC to 12 LPM)  Mild intermittent asthma: stable   Obstructive sleep apnea, not using CPAP at home.  Complicating oxygen requirement.  Acute kidney injury: Improved.  Abnormal LFT: Improving.  Hepatic Function Latest Ref Rng & Units 04/15/2020 04/14/2020 04/13/2020  Total Protein 6.5 - 8.1 g/dL 6.6 6.8 6.5  Albumin 3.5 - 5.0 g/dL 2.9(L) 2.9(L) 3.0(L)  AST 15 - 41 U/L 26 34 39  ALT 0 - 44 U/L 78(H) 83(H) 86(H)  Alk Phosphatase 38 - 126 U/L 78 76 89  Total Bilirubin 0.3 - 1.2 mg/dL 0.7 1.0 0.7     DVT prophylaxis: Lovenox subcu   Code Status: full code  Family Communication: daughter on the phone 9/2 Disposition Plan: Status is: Inpatient  Remains inpatient appropriate because:Inpatient level of care appropriate due to severity of illness   Dispo: The patient is from: Home              Anticipated d/c is to: LTAC denied by insurance company.  Able to go to a skilled nursing facility when oxygenation improves to 6 L or less.              Anticipated d/c date is: 3 days              Patient currently is not medically stable to d/c.  A peer to peer review was done with insurance provider to help patient go to LTAC to continue to wean off from oxygen as well as to get inpatient therapies, however insurance company denied.  Skilled nursing facility will not take her until she is less than 6 L of oxygen.  Consultants:   None  Procedures:   None  Antimicrobials:  Antibiotics Given (last 72 hours)    None         Subjective: Patient seen and examined. No overnight events. Since yesterday, she feels more comfortable however is still on significant level of oxygen. Objective: Vitals:   04/16/20 0419 04/16/20 0907 04/16/20 1009 04/16/20 1129  BP: 128/82   124/86  Pulse: 81   93  Resp: 20   (!) 24  Temp: 98 F (36.7 C)   98.2 F (36.8 C)  TempSrc: Oral   Oral  SpO2: 98% 95% (!) 85% 95%  Weight:      Height:        Intake/Output Summary  (Last 24 hours) at 04/16/2020 1305 Last data filed at 04/16/2020 1200 Gross per 24 hour  Intake 360 ml  Output 600 ml  Net -240 ml   Filed Weights   04/07/20 1000 04/09/20 0550 04/12/20 1727  Weight: 98.9 kg 99.1 kg 95.6 kg    Examination: Physical Exam Constitutional:      Appearance: She is well-developed.     Comments: In mild respiratory stress on talking. On 9 L of oxygen.  HENT:     Head: Normocephalic and atraumatic.  Cardiovascular:     Rate and Rhythm: Normal rate and regular rhythm.  Pulmonary:     Comments: Bilateral good air entry. No added sounds today. Abdominal:     General: Bowel sounds are normal.     Palpations: Abdomen is soft.  Neurological:     General: No focal deficit present.     Mental Status: She is oriented to person, place, and time.  Psychiatric:        Mood and Affect: Mood is anxious.      Data Reviewed: I have personally reviewed following labs and imaging studies  CBC: Recent Labs  Lab 04/10/20 0913 04/10/20 0913 04/11/20 6122 04/12/20 0235 04/13/20 0447 04/14/20 0447 04/15/20 0445  WBC 11.2*   < > 9.5 12.9* 11.9* 13.4* 10.9*  NEUTROABS 10.1*  --   --   --   --   --   --   HGB 11.7*   < > 12.1 11.6* 12.1 12.6 12.5  HCT 36.3   < > 37.9 36.8 38.8 40.3 40.9  MCV 84.2   < > 84.6 86.0 86.4 87.0 87.8  PLT 230   < > 252 262 246 275 260   < > = values in this interval not displayed.   Basic Metabolic Panel: Recent Labs  Lab 04/11/20 0614 04/12/20 0235 04/13/20 0447 04/14/20 0447 04/15/20 0445  NA 136 136 136 134* 135  K 4.4 5.1 5.1 5.1 4.6  CL 101 102 102 99 100  CO2 _0 GLUCOSE 142* 136* 150* 133* 210*  BUN 28* 34* 34* 26* 29*  CREATININE 0.88 0.90 0.87 0.86 0.87  CALCIUM 9.1 9.3 9.4 9.7 9.7   GFR: Estimated Creatinine Clearance: 70 mL/min (by C-G formula based on SCr of 0.87 mg/dL). Liver Function Tests: Recent Labs  Lab 04/11/20 0614 04/12/20 0235 04/13/20 0447 04/14/20 0447 04/15/20 0445  AST 37 46*  39 34 26  ALT 73* 79* 86* 83* 78*  ALKPHOS 98 92 89 76 78  BILITOT 0.6 0.7 0.7 1.0 0.7  PROT 6.8 6.2* 6.5 6.8 6.6  ALBUMIN 2.8* 2.7* 3.0* 2.9* 2.9*   No results for input(s): LIPASE, AMYLASE in the last 168 hours. No results for input(s): AMMONIA  in the last 168 hours. Coagulation Profile: No results for input(s): INR, PROTIME in the last 168 hours. Cardiac Enzymes: No results for input(s): CKTOTAL, CKMB, CKMBINDEX, TROPONINI in the last 168 hours. BNP (last 3 results) No results for input(s): PROBNP in the last 8760 hours. HbA1C: No results for input(s): HGBA1C in the last 72 hours. CBG: Recent Labs  Lab 04/15/20 2054 04/16/20 0018 04/16/20 0417 04/16/20 0753 04/16/20 1127  GLUCAP 192* 139* 192* 143* 160*   Lipid Profile: No results for input(s): CHOL, HDL, LDLCALC, TRIG, CHOLHDL, LDLDIRECT in the last 72 hours. Thyroid Function Tests: No results for input(s): TSH, T4TOTAL, FREET4, T3FREE, THYROIDAB in the last 72 hours. Anemia Panel: No results for input(s): VITAMINB12, FOLATE, FERRITIN, TIBC, IRON, RETICCTPCT in the last 72 hours. Sepsis Labs: No results for input(s): PROCALCITON, LATICACIDVEN in the last 168 hours.  Recent Results (from the past 240 hour(s))  MRSA PCR Screening     Status: None   Collection Time: 04/06/20  5:22 PM   Specimen: Nasal Mucosa; Nasopharyngeal  Result Value Ref Range Status   MRSA by PCR NEGATIVE NEGATIVE Final    Comment:        The GeneXpert MRSA Assay (FDA approved for NASAL specimens only), is one component of a comprehensive MRSA colonization surveillance program. It is not intended to diagnose MRSA infection nor to guide or monitor treatment for MRSA infections. Performed at [facility], [place] [address], [place], [place] [address]          Radiology Studies: No results found.      Scheduled Meds: . vitamin C  500 mg Oral Daily  . baricitinib  4 mg Oral Daily  . dextromethorphan-guaiFENesin   1 tablet Oral BID  . enoxaparin (LOVENOX) injection  50 mg Subcutaneous Q12H  . feeding supplement (KATE FARMS STANDARD 1.4)  325 mL Oral BID BM  . Ipratropium-Albuterol  1 puff Inhalation Q6H  . mouth rinse  15 mL Mouth Rinse BID  . methylPREDNISolone (SOLU-MEDROL) injection  80 mg Intravenous BID  . multivitamin with minerals  1 tablet Oral Daily  . nystatin  5 mL Oral QID  . oxymetazoline  1 spray Each Nare BID  . pantoprazole  40 mg Oral Daily  . zinc sulfate  220 mg Oral Daily   Continuous Infusions:   LOS: 11 days    Time spent: 30 minutes    Barb Merino, MD [facility] Pager [phone]

## 2020-04-16 NOTE — Care Management Important Message (Signed)
Important Message  Patient Details IM Letter given to the Patient Name: Tiffany Coleman MRN: [id number] Date of Birth: 10/16/1955   Medicare Important Message Given:  Yes     Kerin Salen 04/16/2020, 10:34 AM

## 2020-04-16 NOTE — Plan of Care (Signed)

## 2020-04-17 LAB — GLUCOSE, CAPILLARY
Glucose-Capillary: 186 mg/dL — ABNORMAL HIGH (ref 70–99)
Glucose-Capillary: 128 mg/dL — ABNORMAL HIGH (ref 70–99)
Glucose-Capillary: 124 mg/dL — ABNORMAL HIGH (ref 70–99)
Glucose-Capillary: 151 mg/dL — ABNORMAL HIGH (ref 70–99)
Glucose-Capillary: 224 mg/dL — ABNORMAL HIGH (ref 70–99)

## 2020-04-17 LAB — C-REACTIVE PROTEIN: CRP: 0.6 mg/dL (ref <1.0)

## 2020-04-17 LAB — D-DIMER, QUANTITATIVE: D-Dimer, Quant: 2.79 ug/mL-FEU — ABNORMAL HIGH (ref 0.00–0.50)

## 2020-04-17 NOTE — Progress Notes (Signed)
PROGRESS NOTE    Tiffany Coleman  LZJ:[id number] DOB: Dec 12, 1955 DOA: 04/05/2020 PCP: Nolene Ebbs, MD    Brief Narrative:  Tiffany Coleman is a 64 y.o. female with a history of morbid obesity, OSA not on CPAP, GAD, GERD, and recent covid-19 diagnosis at PCP office who presented to the ED 8/23 having been found passed out and in respiratory distress at home. In the ED, she was febrile, tachycardic, tachypneic 78% on 15L NRB with hypoxia confirmed pO2 27 on VBG. CTA chest was performed showing no PE but did reveal extensive bilateral parenchymal infiltrates consistent with covid-19 pneumonia. Remdesivir, steroids, and baricitinib started and the patient was admitted to medicine service after consultation with PCCM.   Assessment & Plan:   Active Problems:   Obstructive sleep apnea   Generalized anxiety disorder   Morbid obesity (HCC)   Hypertension   GERD (gastroesophageal reflux disease)   Acute hypoxemic respiratory failure due to COVID-19 [facility])  Acute hypoxemic respiratory failure due to COVID-19 pneumonia: Continue to monitor due to significant symptoms  chest physiotherapy, incentive spirometry, deep breathing exercises, sputum induction, mucolytic's and bronchodilators. Supplemental oxygen to keep saturations more than 85 %. Covid directed therapy with , steroids, high-dose Solu-Medrol, will taper off when her oxygen requirement improves. remdesivir, completed 5 days of therapy Baricitinib, started 8/25, will continue for total 14 days or hospital discharge.     COVID-19 Labs  Recent Labs    04/15/20 0445 04/16/20 0447 04/17/20 0500  DDIMER 3.17* 3.00* 2.79*  CRP 2.4* 1.1* 0.6    Lab Results  Component Value Date   SARSCOV2NAA POSITIVE (A) 04/05/2020   SpO2: 96 % O2 Flow Rate (L/min): 7 L/min (plus prb) FiO2 (%): 94 % (decreased HFNC to 12 LPM)  Mild intermittent asthma: stable   Obstructive sleep apnea, not using CPAP at home.  Complicating  oxygen requirement.  Acute kidney injury: Improved.  Abnormal LFT: Improving.  Hepatic Function Latest Ref Rng & Units 04/15/2020 04/14/2020 04/13/2020  Total Protein 6.5 - 8.1 g/dL 6.6 6.8 6.5  Albumin 3.5 - 5.0 g/dL 2.9(L) 2.9(L) 3.0(L)  AST 15 - 41 U/L 26 34 39  ALT 0 - 44 U/L 78(H) 83(H) 86(H)  Alk Phosphatase 38 - 126 U/L 78 76 89  Total Bilirubin 0.3 - 1.2 mg/dL 0.7 1.0 0.7     DVT prophylaxis: Lovenox subcu   Code Status: full code  Family Communication: daughter on the phone. Disposition Plan: Status is: Inpatient  Remains inpatient appropriate because:Inpatient level of care appropriate due to severity of illness   Dispo: The patient is from: Home              Anticipated d/c is to: LTAC denied by insurance company.  Able to go to a skilled nursing facility when oxygenation improves to 6 L or less.              Anticipated d/c date is: 3 days              Patient currently is not medically stable to d/c.  A peer to peer review was done with insurance provider to help patient go to LTAC to continue to wean off from oxygen as well as to get inpatient therapies, however insurance company denied.  Skilled nursing facility will not take her until she is less than 6 L of oxygen.       Consultants:   None  Procedures:   None  Antimicrobials:  Antibiotics Given (last 72  hours)    None         Subjective: Patient seen and examined. No overnight events. Mostly on 6-7 liters of oxygen.  Objective: Vitals:   04/16/20 2010 04/17/20 0419 04/17/20 0815 04/17/20 1253  BP: 122/74 129/84  124/79  Pulse: 93 78  92  Resp: _0 Temp: 97.6 F (36.4 C) 97.7 F (36.5 C)  (!) 97.4 F (36.3 C)  TempSrc: Oral Oral  Oral  SpO2: 95% 92% 90% 96%  Weight:      Height:        Intake/Output Summary (Last 24 hours) at 04/17/2020 1305 Last data filed at 04/17/2020 0419 Gross per 24 hour  Intake 480 ml  Output 1300 ml  Net -820 ml   Filed Weights   04/07/20 1000  04/09/20 0550 04/12/20 1727  Weight: 98.9 kg 99.1 kg 95.6 kg    Examination: Physical Exam Constitutional:      Appearance: She is well-developed.     Comments: In mild respiratory stress on talking. On 7 L of oxygen.  HENT:     Head: Normocephalic and atraumatic.  Cardiovascular:     Rate and Rhythm: Normal rate and regular rhythm.  Pulmonary:     Comments: Bilateral good air entry. No added sounds today. Abdominal:     General: Bowel sounds are normal.     Palpations: Abdomen is soft.  Neurological:     General: No focal deficit present.     Mental Status: She is oriented to person, place, and time.  Psychiatric:        Mood and Affect: Mood is not anxious.        Behavior: Behavior is not agitated.      Data Reviewed: I have personally reviewed following labs and imaging studies  CBC: Recent Labs  Lab 04/11/20 0614 04/12/20 0235 04/13/20 0447 04/14/20 0447 04/15/20 0445  WBC 9.5 12.9* 11.9* 13.4* 10.9*  HGB 12.1 11.6* 12.1 12.6 12.5  HCT 37.9 36.8 38.8 40.3 40.9  MCV 84.6 86.0 86.4 87.0 87.8  PLT 252 262 246 275 482   Basic Metabolic Panel: Recent Labs  Lab 04/11/20 0614 04/12/20 0235 04/13/20 0447 04/14/20 0447 04/15/20 0445  NA 136 136 136 134* 135  K 4.4 5.1 5.1 5.1 4.6  CL 101 102 102 99 100  CO2 _1 GLUCOSE 142* 136* 150* 133* 210*  BUN 28* 34* 34* 26* 29*  CREATININE 0.88 0.90 0.87 0.86 0.87  CALCIUM 9.1 9.3 9.4 9.7 9.7   GFR: Estimated Creatinine Clearance: 70 mL/min (by C-G formula based on SCr of 0.87 mg/dL). Liver Function Tests: Recent Labs  Lab 04/11/20 0614 04/12/20 0235 04/13/20 0447 04/14/20 0447 04/15/20 0445  AST 37 46* 39 34 26  ALT 73* 79* 86* 83* 78*  ALKPHOS 98 92 89 76 78  BILITOT 0.6 0.7 0.7 1.0 0.7  PROT 6.8 6.2* 6.5 6.8 6.6  ALBUMIN 2.8* 2.7* 3.0* 2.9* 2.9*   No results for input(s): LIPASE, AMYLASE in the last 168 hours. No results for input(s): AMMONIA in the last 168 hours. Coagulation  Profile: No results for input(s): INR, PROTIME in the last 168 hours. Cardiac Enzymes: No results for input(s): CKTOTAL, CKMB, CKMBINDEX, TROPONINI in the last 168 hours. BNP (last 3 results) No results for input(s): PROBNP in the last 8760 hours. HbA1C: No results for input(s): HGBA1C in the last 72 hours. CBG: Recent Labs  Lab 04/16/20 2008 04/16/20 2356 04/17/20 0416  04/17/20 0834 04/17/20 1250  GLUCAP 202* 180* 186* 128* 124*   Lipid Profile: No results for input(s): CHOL, HDL, LDLCALC, TRIG, CHOLHDL, LDLDIRECT in the last 72 hours. Thyroid Function Tests: No results for input(s): TSH, T4TOTAL, FREET4, T3FREE, THYROIDAB in the last 72 hours. Anemia Panel: No results for input(s): VITAMINB12, FOLATE, FERRITIN, TIBC, IRON, RETICCTPCT in the last 72 hours. Sepsis Labs: No results for input(s): PROCALCITON, LATICACIDVEN in the last 168 hours.  No results found for this or any previous visit (from the past 240 hour(s)).       Radiology Studies: No results found.      Scheduled Meds: . vitamin C  500 mg Oral Daily  . baricitinib  4 mg Oral Daily  . dextromethorphan-guaiFENesin  1 tablet Oral BID  . enoxaparin (LOVENOX) injection  50 mg Subcutaneous Q12H  . feeding supplement (KATE FARMS STANDARD 1.4)  325 mL Oral BID BM  . Ipratropium-Albuterol  1 puff Inhalation Q6H  . mouth rinse  15 mL Mouth Rinse BID  . methylPREDNISolone (SOLU-MEDROL) injection  80 mg Intravenous BID  . multivitamin with minerals  1 tablet Oral Daily  . nystatin  5 mL Oral QID  . oxymetazoline  1 spray Each Nare BID  . pantoprazole  40 mg Oral Daily  . zinc sulfate  220 mg Oral Daily   Continuous Infusions:   LOS: 12 days    Time spent: 30 minutes    Barb Merino, MD [facility] Pager [phone]

## 2020-04-18 LAB — GLUCOSE, CAPILLARY
Glucose-Capillary: 118 mg/dL — ABNORMAL HIGH (ref 70–99)
Glucose-Capillary: 125 mg/dL — ABNORMAL HIGH (ref 70–99)
Glucose-Capillary: 166 mg/dL — ABNORMAL HIGH (ref 70–99)
Glucose-Capillary: 200 mg/dL — ABNORMAL HIGH (ref 70–99)

## 2020-04-18 MED ORDER — METHYLPREDNISOLONE SODIUM SUCC 125 MG IJ SOLR
40.0000 mg | Freq: Every day | INTRAMUSCULAR | Status: DC
  Administered 2020-04-19 – 2020-04-28 (×10): 40 mg via INTRAVENOUS
  Filled 2020-04-18 (×10): qty 2

## 2020-04-18 NOTE — Progress Notes (Signed)
PROGRESS NOTE    Tiffany Coleman  IDP:[id number] DOB: 1956-03-25 DOA: 04/05/2020 PCP: Nolene Ebbs, MD    Brief Narrative:  Tiffany Coleman is a 64 y.o. female with a history of morbid obesity, OSA not on CPAP, GAD, GERD, and recent covid-19 diagnosis at PCP office who presented to the ED 8/23 having been found passed out and in respiratory distress at home. In the ED, she was febrile, tachycardic, tachypneic 78% on 15L NRB with hypoxia confirmed pO2 27 on VBG. CTA chest was performed showing no PE but did reveal extensive bilateral parenchymal infiltrates consistent with covid-19 pneumonia. Remdesivir, steroids, and baricitinib started and the patient was admitted to medicine service after consultation with PCCM. 9/5, remains in the hospital with persistent high oxygen requirement anywhere between 6 L to 15 L.  Assessment & Plan:   Active Problems:   Obstructive sleep apnea   Generalized anxiety disorder   Morbid obesity (HCC)   Hypertension   GERD (gastroesophageal reflux disease)   Acute hypoxemic respiratory failure due to COVID-19 [facility])  Acute hypoxemic respiratory failure due to COVID-19 pneumonia: Continue to monitor due to significant symptoms  chest physiotherapy, incentive spirometry, deep breathing exercises, sputum induction, mucolytic's and bronchodilators. Supplemental oxygen to keep saturations more than 85 %. Covid directed therapy with , steroids, high-dose Solu-Medrol, will taper off when her oxygen requirement improves. remdesivir, completed 5 days of therapy Baricitinib, started 8/25, will continue for total 14 days or hospital discharge.     COVID-19 Labs  Recent Labs    04/16/20 0447 04/17/20 0500  DDIMER 3.00* 2.79*  CRP 1.1* 0.6    Lab Results  Component Value Date   SARSCOV2NAA POSITIVE (A) 04/05/2020   SpO2: 90 % O2 Flow Rate (L/min): 7 L/min FiO2 (%): 94 % (decreased HFNC to 12 LPM)  Mild intermittent asthma: stable    Obstructive sleep apnea, not using CPAP at home.  Complicating oxygen requirement.  Acute kidney injury: Improved.  Abnormal LFT: Improving.  Hepatic Function Latest Ref Rng & Units 04/15/2020 04/14/2020 04/13/2020  Total Protein 6.5 - 8.1 g/dL 6.6 6.8 6.5  Albumin 3.5 - 5.0 g/dL 2.9(L) 2.9(L) 3.0(L)  AST 15 - 41 U/L 26 34 39  ALT 0 - 44 U/L 78(H) 83(H) 86(H)  Alk Phosphatase 38 - 126 U/L 78 76 89  Total Bilirubin 0.3 - 1.2 mg/dL 0.7 1.0 0.7     DVT prophylaxis: Lovenox subcu   Code Status: full code  Family Communication: daughter on the phone 9/4 Disposition Plan: Status is: Inpatient  Remains inpatient appropriate because:Inpatient level of care appropriate due to severity of illness   Dispo: The patient is from: Home              Anticipated d/c is to: LTAC denied by insurance company.  Able to go to a skilled nursing facility when oxygenation improves to 6 L or less.              Anticipated d/c date is: 3 days              Patient currently is not medically stable to d/c.  A peer to peer review was done with insurance provider to help patient go to LTAC to continue to wean off from oxygen as well as to get inpatient therapies, however insurance company denied.  Skilled nursing facility will not take her until she is less than 6 L of oxygen.       Consultants:   None  Procedures:   None  Antimicrobials:  Antibiotics Given (last 72 hours)    None         Subjective: Seen and examined.  No overnight events.  Mostly maintained on 7 to 8 L of oxygen, she keeps nonrebreather handy to put it on after eating because she gets short winded. Difficulty breathing going to commode and with minimum activities.  Objective: Vitals:   04/17/20 2139 04/17/20 2300 04/18/20 0612 04/18/20 0900  BP: 131/79  129/84   Pulse: (!) 104  75   Resp: 18 18 (!) 24   Temp: (!) 97.4 F (36.3 C)  98.1 F (36.7 C)   TempSrc: Oral  Oral   SpO2: 93%  100% 90%  Weight:       Height:        Intake/Output Summary (Last 24 hours) at 04/18/2020 1041 Last data filed at 04/18/2020 0646 Gross per 24 hour  Intake --  Output 1500 ml  Net -1500 ml   Filed Weights   04/07/20 1000 04/09/20 0550 04/12/20 1727  Weight: 98.9 kg 99.1 kg 95.6 kg    Examination: Physical Exam Constitutional:      Appearance: She is well-developed.     Comments: In mild respiratory stress on talking. On 7 L of oxygen.  HENT:     Head: Normocephalic and atraumatic.  Cardiovascular:     Rate and Rhythm: Normal rate and regular rhythm.  Pulmonary:     Comments: Bilateral good air entry. No added sounds today. Abdominal:     General: Bowel sounds are normal.     Palpations: Abdomen is soft.  Neurological:     General: No focal deficit present.     Mental Status: She is oriented to person, place, and time.  Psychiatric:        Mood and Affect: Mood is not anxious.        Behavior: Behavior is not agitated.      Data Reviewed: I have personally reviewed following labs and imaging studies  CBC: Recent Labs  Lab 04/12/20 0235 04/13/20 0447 04/14/20 0447 04/15/20 0445  WBC 12.9* 11.9* 13.4* 10.9*  HGB 11.6* 12.1 12.6 12.5  HCT 36.8 38.8 40.3 40.9  MCV 86.0 86.4 87.0 87.8  PLT 262 246 275 503   Basic Metabolic Panel: Recent Labs  Lab 04/12/20 0235 04/13/20 0447 04/14/20 0447 04/15/20 0445  NA 136 136 134* 135  K 5.1 5.1 5.1 4.6  CL 102 102 99 100  CO2 _0 GLUCOSE 136* 150* 133* 210*  BUN 34* 34* 26* 29*  CREATININE 0.90 0.87 0.86 0.87  CALCIUM 9.3 9.4 9.7 9.7   GFR: Estimated Creatinine Clearance: 70 mL/min (by C-G formula based on SCr of 0.87 mg/dL). Liver Function Tests: Recent Labs  Lab 04/12/20 0235 04/13/20 0447 04/14/20 0447 04/15/20 0445  AST 46* 39 34 26  ALT 79* 86* 83* 78*  ALKPHOS 92 89 76 78  BILITOT 0.7 0.7 1.0 0.7  PROT 6.2* 6.5 6.8 6.6  ALBUMIN 2.7* 3.0* 2.9* 2.9*   No results for input(s): LIPASE, AMYLASE in the last 168  hours. No results for input(s): AMMONIA in the last 168 hours. Coagulation Profile: No results for input(s): INR, PROTIME in the last 168 hours. Cardiac Enzymes: No results for input(s): CKTOTAL, CKMB, CKMBINDEX, TROPONINI in the last 168 hours. BNP (last 3 results) No results for input(s): PROBNP in the last 8760 hours. HbA1C: No results for input(s): HGBA1C in the last 72  hours. CBG: Recent Labs  Lab 04/17/20 0834 04/17/20 1250 04/17/20 1700 04/17/20 2029 04/18/20 0745  GLUCAP 128* 124* 151* 224* 118*   Lipid Profile: No results for input(s): CHOL, HDL, LDLCALC, TRIG, CHOLHDL, LDLDIRECT in the last 72 hours. Thyroid Function Tests: No results for input(s): TSH, T4TOTAL, FREET4, T3FREE, THYROIDAB in the last 72 hours. Anemia Panel: No results for input(s): VITAMINB12, FOLATE, FERRITIN, TIBC, IRON, RETICCTPCT in the last 72 hours. Sepsis Labs: No results for input(s): PROCALCITON, LATICACIDVEN in the last 168 hours.  No results found for this or any previous visit (from the past 240 hour(s)).       Radiology Studies: No results found.      Scheduled Meds: . vitamin C  500 mg Oral Daily  . dextromethorphan-guaiFENesin  1 tablet Oral BID  . enoxaparin (LOVENOX) injection  50 mg Subcutaneous Q12H  . feeding supplement (KATE FARMS STANDARD 1.4)  325 mL Oral BID BM  . Ipratropium-Albuterol  1 puff Inhalation Q6H  . mouth rinse  15 mL Mouth Rinse BID  . methylPREDNISolone (SOLU-MEDROL) injection  80 mg Intravenous BID  . multivitamin with minerals  1 tablet Oral Daily  . nystatin  5 mL Oral QID  . oxymetazoline  1 spray Each Nare BID  . pantoprazole  40 mg Oral Daily  . zinc sulfate  220 mg Oral Daily   Continuous Infusions:   LOS: 13 days    Time spent: 30 minutes    Barb Merino, MD [facility] Pager [phone]

## 2020-04-18 NOTE — Plan of Care (Signed)
Patient has maintained oxygen saturation greater than 85% on 15 L HFNC, has not worn partial non-rebreather during 7 a to 7 p shift.

## 2020-04-19 LAB — GLUCOSE, CAPILLARY
Glucose-Capillary: 104 mg/dL — ABNORMAL HIGH (ref 70–99)
Glucose-Capillary: 120 mg/dL — ABNORMAL HIGH (ref 70–99)
Glucose-Capillary: 162 mg/dL — ABNORMAL HIGH (ref 70–99)
Glucose-Capillary: 175 mg/dL — ABNORMAL HIGH (ref 70–99)

## 2020-04-19 NOTE — Progress Notes (Signed)
BiPAP remains [facility].

## 2020-04-19 NOTE — Progress Notes (Signed)
PROGRESS NOTE    Tiffany Coleman  CRF:[id number] DOB: [birth year] DOA: 04/05/2020 PCP: Nolene Ebbs, MD    Brief Narrative:  Tiffany Coleman is a 64 y.o. female with a history of morbid obesity, OSA not on CPAP, GAD, GERD, and recent covid-19 diagnosis at PCP office who presented to the ED 8/23 having been found passed out and in respiratory distress at home. In the ED, she was febrile, tachycardic, tachypneic 78% on 15L NRB with hypoxia confirmed pO2 27 on VBG. CTA chest was performed showing no PE but did reveal extensive bilateral parenchymal infiltrates consistent with covid-19 pneumonia. Remdesivir, steroids, and baricitinib started and the patient was admitted to medicine service after consultation with PCCM. 9/5, remains in the hospital with persistent high oxygen requirement anywhere between 6 L to 15 L. [facility] declined admission.  Assessment & Plan:   Active Problems:   Obstructive sleep apnea   Generalized anxiety disorder   Morbid obesity (HCC)   Hypertension   GERD (gastroesophageal reflux disease)   Acute hypoxemic respiratory failure due to COVID-19 [facility])  Acute hypoxemic respiratory failure due to COVID-19 pneumonia: Continue to monitor due to significant symptoms  chest physiotherapy, incentive spirometry, deep breathing exercises, sputum induction, mucolytic's and bronchodilators. Supplemental oxygen to keep saturations more than 85 %. Covid directed therapy with , steroids, high-dose Solu-Medrol, will taper off when her oxygen requirement improves. remdesivir, completed 5 days of therapy Baricitinib, started 8/25-8/8, will continue for total 14 days or hospital discharge.     COVID-19 Labs  Recent Labs    04/17/20 0500  DDIMER 2.79*  CRP 0.6    Lab Results  Component Value Date   SARSCOV2NAA POSITIVE (A) 04/05/2020   SpO2: 99 % O2 Flow Rate (L/min): 15 L/min FiO2 (%): 94 % (decreased HFNC to 12 LPM)  Mild intermittent asthma:  stable   Obstructive sleep apnea, not using CPAP at home.  Complicating oxygen requirement.  Acute kidney injury: Improved.  Abnormal LFT: Improving.  Hepatic Function Latest Ref Rng & Units 04/15/2020 04/14/2020 04/13/2020  Total Protein 6.5 - 8.1 g/dL 6.6 6.8 6.5  Albumin 3.5 - 5.0 g/dL 2.9(L) 2.9(L) 3.0(L)  AST 15 - 41 U/L 26 34 39  ALT 0 - 44 U/L 78(H) 83(H) 86(H)  Alk Phosphatase 38 - 126 U/L 78 76 89  Total Bilirubin 0.3 - 1.2 mg/dL 0.7 1.0 0.7     DVT prophylaxis: Lovenox subcu   Code Status: full code  Family Communication: daughter on the phone 9/5 Disposition Plan: Status is: Inpatient  Remains inpatient appropriate because:Inpatient level of care appropriate due to severity of illness   Dispo: The patient is from: Home              Anticipated d/c is to: LTAC denied by insurance company.  Able to go to a skilled nursing facility when oxygenation improves to 6 L or less.              Anticipated d/c date is: 3 days              Patient currently is not medically stable to d/c.  A peer to peer review was done with insurance provider to help patient go to LTAC to continue to wean off from oxygen as well as to get inpatient therapies, however insurance company denied.  Skilled nursing facility will not take her until she is less than 6 L of oxygen.       Consultants:   None  Procedures:  None  Antimicrobials:  Antibiotics Given (last 72 hours)    None         Subjective: Seen and examined.  No overnight events.  On variable oxygen.  Anywhere between 7 to 15 L.  Objective: Vitals:   04/19/20 0200 04/19/20 0300 04/19/20 0543 04/19/20 0650  BP:   125/78   Pulse:   85   Resp: (!) 21 (!) 22 20 (!) 22  Temp:   97.8 F (36.6 C)   TempSrc:   Oral   SpO2:   99%   Weight:      Height:        Intake/Output Summary (Last 24 hours) at 04/19/2020 1019 Last data filed at 04/19/2020 0700 Gross per 24 hour  Intake 600 ml  Output 2300 ml  Net -1700 ml    Filed Weights   04/07/20 1000 04/09/20 0550 04/12/20 1727  Weight: 98.9 kg 99.1 kg 95.6 kg    Examination: Physical Exam Constitutional:      Appearance: She is well-developed.     Comments: In mild respiratory stress on talking.  On 15 L oxygen.  HENT:     Head: Normocephalic and atraumatic.  Cardiovascular:     Rate and Rhythm: Normal rate and regular rhythm.  Pulmonary:     Comments: Bilateral good air entry. No added sounds today. Abdominal:     General: Bowel sounds are normal.     Palpations: Abdomen is soft.  Neurological:     General: No focal deficit present.     Mental Status: She is oriented to person, place, and time.  Psychiatric:        Mood and Affect: Mood is not anxious.        Behavior: Behavior is not agitated.      Data Reviewed: I have personally reviewed following labs and imaging studies  CBC: Recent Labs  Lab 04/13/20 0447 04/14/20 0447 04/15/20 0445  WBC 11.9* 13.4* 10.9*  HGB 12.1 12.6 12.5  HCT 38.8 40.3 40.9  MCV 86.4 87.0 87.8  PLT 246 275 242   Basic Metabolic Panel: Recent Labs  Lab 04/13/20 0447 04/14/20 0447 04/15/20 0445  NA 136 134* 135  K 5.1 5.1 4.6  CL 102 99 100  CO2 _0 GLUCOSE 150* 133* 210*  BUN 34* 26* 29*  CREATININE 0.87 0.86 0.87  CALCIUM 9.4 9.7 9.7   GFR: Estimated Creatinine Clearance: 70 mL/min (by C-G formula based on SCr of 0.87 mg/dL). Liver Function Tests: Recent Labs  Lab 04/13/20 0447 04/14/20 0447 04/15/20 0445  AST 39 34 26  ALT 86* 83* 78*  ALKPHOS 89 76 78  BILITOT 0.7 1.0 0.7  PROT 6.5 6.8 6.6  ALBUMIN 3.0* 2.9* 2.9*   No results for input(s): LIPASE, AMYLASE in the last 168 hours. No results for input(s): AMMONIA in the last 168 hours. Coagulation Profile: No results for input(s): INR, PROTIME in the last 168 hours. Cardiac Enzymes: No results for input(s): CKTOTAL, CKMB, CKMBINDEX, TROPONINI in the last 168 hours. BNP (last 3 results) No results for input(s): PROBNP  in the last 8760 hours. HbA1C: No results for input(s): HGBA1C in the last 72 hours. CBG: Recent Labs  Lab 04/18/20 0745 04/18/20 1144 04/18/20 1703 04/18/20 2009 04/19/20 0811  GLUCAP 118* 125* 166* 200* 104*   Lipid Profile: No results for input(s): CHOL, HDL, LDLCALC, TRIG, CHOLHDL, LDLDIRECT in the last 72 hours. Thyroid Function Tests: No results for input(s): TSH, T4TOTAL, FREET4, T3FREE,  THYROIDAB in the last 72 hours. Anemia Panel: No results for input(s): VITAMINB12, FOLATE, FERRITIN, TIBC, IRON, RETICCTPCT in the last 72 hours. Sepsis Labs: No results for input(s): PROCALCITON, LATICACIDVEN in the last 168 hours.  No results found for this or any previous visit (from the past 240 hour(s)).       Radiology Studies: No results found.      Scheduled Meds: . vitamin C  500 mg Oral Daily  . dextromethorphan-guaiFENesin  1 tablet Oral BID  . enoxaparin (LOVENOX) injection  50 mg Subcutaneous Q12H  . feeding supplement (KATE FARMS STANDARD 1.4)  325 mL Oral BID BM  . Ipratropium-Albuterol  1 puff Inhalation Q6H  . mouth rinse  15 mL Mouth Rinse BID  . methylPREDNISolone (SOLU-MEDROL) injection  40 mg Intravenous Daily  . multivitamin with minerals  1 tablet Oral Daily  . nystatin  5 mL Oral QID  . oxymetazoline  1 spray Each Nare BID  . pantoprazole  40 mg Oral Daily  . zinc sulfate  220 mg Oral Daily   Continuous Infusions:   LOS: 14 days    Time spent: 30 minutes    Barb Merino, MD [facility] Pager [phone]

## 2020-04-19 NOTE — Progress Notes (Signed)
Maintained O2 sat > 85% while on 15L HFNC. NRB not used during 1900-0700 shift

## 2020-04-20 LAB — GLUCOSE, CAPILLARY
Glucose-Capillary: 99 mg/dL (ref 70–99)
Glucose-Capillary: 110 mg/dL — ABNORMAL HIGH (ref 70–99)
Glucose-Capillary: 204 mg/dL — ABNORMAL HIGH (ref 70–99)
Glucose-Capillary: 204 mg/dL — ABNORMAL HIGH (ref 70–99)

## 2020-04-20 NOTE — Progress Notes (Signed)
PROGRESS NOTE    Tiffany Coleman  MRN:[id number] DOB: 03/26/1956 DOA: 04/05/2020 PCP: Avbuere, Edwin, MD    Brief Narrative:  Tiffany Coleman is a 64 y.o. female with a history of morbid obesity, OSA not on CPAP, GAD, GERD, and recent covid-19 diagnosis at PCP office who presented to the ED 8/23 having been found passed out and in respiratory distress at home. In the ED, she was febrile, tachycardic, tachypneic 78% on 15L NRB with hypoxia confirmed pO2 27 on VBG. CTA chest was performed showing no PE but did reveal extensive bilateral parenchymal infiltrates consistent with covid-19 pneumonia. Remdesivir, steroids, and baricitinib started and the patient was admitted to medicine service after consultation with PCCM. 9/5, remains in the hospital with persistent high oxygen requirement anywhere between 6 L to 15 L. [facility] declined admission. Cannot go to skilled nursing facility because she is on more than 6 L oxygen.  Assessment & Plan:   Active Problems:   Obstructive sleep apnea   Generalized anxiety disorder   Morbid obesity (HCC)   Hypertension   GERD (gastroesophageal reflux disease)   Acute hypoxemic respiratory failure due to COVID-19 (HCC)  Acute hypoxemic respiratory failure due to COVID-19 pneumonia: Continue to monitor due to significant symptoms  chest physiotherapy, incentive spirometry, deep breathing exercises, sputum induction, mucolytic's and bronchodilators. Supplemental oxygen to keep saturations more than 85 %. Covid directed therapy with , steroids, high-dose Solu-Medrol, tapering off. remdesivir, completed 5 days of therapy Baricitinib, started 8/25-8/8, will continue for total 14 days    COVID-19 Labs  No results for input(s): DDIMER, FERRITIN, LDH, CRP in the last 72 hours.  Lab Results  Component Value Date   SARSCOV2NAA POSITIVE (A) 04/05/2020   SpO2: 94 % O2 Flow Rate (L/min): 14 L/min FiO2 (%): 94 % (decreased HFNC to 12  LPM)  Mild intermittent asthma: stable   Obstructive sleep apnea, not using CPAP at home.  Complicating oxygen requirement. Patient has been noncompliant to using BiPAP event in the hospital.  We discussed in detail.  She has untreated sleep apnea that is complicating her recovery and oxygen requirement. Patient is convinced to try CPAP tonight.  Discussed with respiratory therapist to give her CPAP tonight.  Acute kidney injury: Improved.  Abnormal LFT: Improving.  Hepatic Function Latest Ref Rng & Units 04/15/2020 04/14/2020 04/13/2020  Total Protein 6.5 - 8.1 g/dL 6.6 6.8 6.5  Albumin 3.5 - 5.0 g/dL 2.9(L) 2.9(L) 3.0(L)  AST 15 - 41 U/L 26 34 39  ALT 0 - 44 U/L 78(H) 83(H) 86(H)  Alk Phosphatase 38 - 126 U/L 78 76 89  Total Bilirubin 0.3 - 1.2 mg/dL 0.7 1.0 0.7     DVT prophylaxis: Lovenox subcu   Code Status: full code  Family Communication: daughter on the phone 9/6 Disposition Plan: Status is: Inpatient  Remains inpatient appropriate because:Inpatient level of care appropriate due to severity of illness   Dispo: The patient is from: Home              Anticipated d/c is to: Unknown.              Anticipated d/c date is: Unknown.              Patient currently is not medically stable to d/c.  Unsafe discharge plan.   Consultants:   None  Procedures:   None  Antimicrobials:  Antibiotics Given (last 72 hours)    None         Subjective:   No overnight events.  She feels as she is.  Convinced that she will try CPAP tonight. Anywhere between 7 L to 15 L of oxygen depends upon activities.  Objective: Vitals:   04/20/20 0400 04/20/20 0500 04/20/20 0524 04/20/20 0544  BP:    120/86  Pulse:    100  Resp: (!) 23 (!) 23 (!) 21 (!) 22  Temp:    98.9 F (37.2 C)  TempSrc:    Oral  SpO2:    94%  Weight:      Height:        Intake/Output Summary (Last 24 hours) at 04/20/2020 1054 Last data filed at 04/19/2020 1704 Gross per 24 hour  Intake 325 ml  Output 1600 ml   Net -1275 ml   Filed Weights   04/07/20 1000 04/09/20 0550 04/12/20 1727  Weight: 98.9 kg 99.1 kg 95.6 kg    Examination: Physical Exam Constitutional:      Appearance: She is well-developed.     Comments: Looks comfortable at rest.  Slightly anxious in mild increased work of breathing on talking.  She was on 14 L oxygen on my evaluation.  HENT:     Head: Normocephalic and atraumatic.  Cardiovascular:     Rate and Rhythm: Normal rate and regular rhythm.  Pulmonary:     Comments: Bilateral good air entry. No added sounds today. Abdominal:     General: Bowel sounds are normal.     Palpations: Abdomen is soft.  Neurological:     General: No focal deficit present.     Mental Status: She is oriented to person, place, and time.  Psychiatric:        Mood and Affect: Mood is not anxious.        Behavior: Behavior is not agitated.      Data Reviewed: I have personally reviewed following labs and imaging studies  CBC: Recent Labs  Lab 04/14/20 0447 04/15/20 0445  WBC 13.4* 10.9*  HGB 12.6 12.5  HCT 40.3 40.9  MCV 87.0 87.8  PLT 275 295   Basic Metabolic Panel: Recent Labs  Lab 04/14/20 0447 04/15/20 0445  NA 134* 135  K 5.1 4.6  CL 99 100  CO2 24 26  GLUCOSE 133* 210*  BUN 26* 29*  CREATININE 0.86 0.87  CALCIUM 9.7 9.7   GFR: Estimated Creatinine Clearance: 70 mL/min (by C-G formula based on SCr of 0.87 mg/dL). Liver Function Tests: Recent Labs  Lab 04/14/20 0447 04/15/20 0445  AST 34 26  ALT 83* 78*  ALKPHOS 76 78  BILITOT 1.0 0.7  PROT 6.8 6.6  ALBUMIN 2.9* 2.9*   No results for input(s): LIPASE, AMYLASE in the last 168 hours. No results for input(s): AMMONIA in the last 168 hours. Coagulation Profile: No results for input(s): INR, PROTIME in the last 168 hours. Cardiac Enzymes: No results for input(s): CKTOTAL, CKMB, CKMBINDEX, TROPONINI in the last 168 hours. BNP (last 3 results) No results for input(s): PROBNP in the last 8760  hours. HbA1C: No results for input(s): HGBA1C in the last 72 hours. CBG: Recent Labs  Lab 04/19/20 0811 04/19/20 1216 04/19/20 1636 04/19/20 2134 04/20/20 0738  GLUCAP 104* 120* 162* 175* 99   Lipid Profile: No results for input(s): CHOL, HDL, LDLCALC, TRIG, CHOLHDL, LDLDIRECT in the last 72 hours. Thyroid Function Tests: No results for input(s): TSH, T4TOTAL, FREET4, T3FREE, THYROIDAB in the last 72 hours. Anemia Panel: No results for input(s): VITAMINB12, FOLATE, FERRITIN, TIBC, IRON, RETICCTPCT in the last  72 hours. Sepsis Labs: No results for input(s): PROCALCITON, LATICACIDVEN in the last 168 hours.  No results found for this or any previous visit (from the past 240 hour(s)).       Radiology Studies: No results found.      Scheduled Meds: . vitamin C  500 mg Oral Daily  . dextromethorphan-guaiFENesin  1 tablet Oral BID  . enoxaparin (LOVENOX) injection  50 mg Subcutaneous Q12H  . feeding supplement (KATE FARMS STANDARD 1.4)  325 mL Oral BID BM  . Ipratropium-Albuterol  1 puff Inhalation Q6H  . mouth rinse  15 mL Mouth Rinse BID  . methylPREDNISolone (SOLU-MEDROL) injection  40 mg Intravenous Daily  . multivitamin with minerals  1 tablet Oral Daily  . nystatin  5 mL Oral QID  . oxymetazoline  1 spray Each Nare BID  . pantoprazole  40 mg Oral Daily  . zinc sulfate  220 mg Oral Daily   Continuous Infusions:   LOS: 15 days    Time spent: 30 minutes    Barb Merino, MD [facility] Pager [phone]

## 2020-04-21 LAB — BASIC METABOLIC PANEL
Anion gap: 10 (ref 5–15)
BUN: 26 mg/dL — ABNORMAL HIGH (ref 8–23)
CO2: 31 mmol/L (ref 22–32)
Calcium: 9.9 mg/dL (ref 8.9–10.3)
Chloride: 99 mmol/L (ref 98–111)
Creatinine, Ser: 0.88 mg/dL (ref 0.44–1.00)
GFR calc Af Amer: >60 mL/min (ref >60)
GFR calc non Af Amer: >60 mL/min (ref >60)
Glucose, Bld: 128 mg/dL — ABNORMAL HIGH (ref 70–99)
Potassium: 4.6 mmol/L (ref 3.5–5.1)
Sodium: 140 mmol/L (ref 135–145)

## 2020-04-21 LAB — CBC WITH DIFFERENTIAL/PLATELET
Abs Immature Granulocytes: 0.05 10*3/uL (ref 0.00–0.07)
Basophils Absolute: 0 10*3/uL (ref 0.0–0.1)
Basophils Relative: 0 %
Eosinophils Absolute: 0.1 10*3/uL (ref 0.0–0.5)
Eosinophils Relative: 1 %
HCT: 37.1 % (ref 36.0–46.0)
Hemoglobin: 11.6 g/dL — ABNORMAL LOW (ref 12.0–15.0)
Immature Granulocytes: 1 %
Lymphocytes Relative: 7 %
Lymphs Abs: 0.8 10*3/uL (ref 0.7–4.0)
MCH: 27.4 pg (ref 26.0–34.0)
MCHC: 31.3 g/dL (ref 30.0–36.0)
MCV: 87.7 fL (ref 80.0–100.0)
Monocytes Absolute: 0.7 10*3/uL (ref 0.1–1.0)
Monocytes Relative: 7 %
Neutro Abs: 9.5 10*3/uL — ABNORMAL HIGH (ref 1.7–7.7)
Neutrophils Relative %: 84 %
Platelets: 220 10*3/uL (ref 150–400)
RBC: 4.23 MIL/uL (ref 3.87–5.11)
RDW: 15 % (ref 11.5–15.5)
WBC: 11.1 10*3/uL — ABNORMAL HIGH (ref 4.0–10.5)
nRBC: 0 % (ref 0.0–0.2)

## 2020-04-21 LAB — GLUCOSE, CAPILLARY
Glucose-Capillary: 125 mg/dL — ABNORMAL HIGH (ref 70–99)
Glucose-Capillary: 219 mg/dL — ABNORMAL HIGH (ref 70–99)
Glucose-Capillary: 193 mg/dL — ABNORMAL HIGH (ref 70–99)

## 2020-04-21 LAB — PHOSPHORUS: Phosphorus: 3.9 mg/dL (ref 2.5–4.6)

## 2020-04-21 LAB — MAGNESIUM: Magnesium: 2.1 mg/dL (ref 1.7–2.4)

## 2020-04-21 MED ORDER — KATE FARMS STANDARD 1.4 PO LIQD
325.0000 mL | Freq: Every day | ORAL | Status: DC
  Administered 2020-04-21 – 2020-05-11 (×19): 325 mL via ORAL
  Filled 2020-04-21 (×21): qty 325

## 2020-04-21 NOTE — Progress Notes (Signed)
RT Note: RN made this RT aware of pt. inquiry/MD progress notes inquiring about CPAP use this evening~_0 , made aware of current policy on use of CPAP/BiPAP with pts. currently on HFNC/HHFNC(Heated) with (+) Covid results with increased oxygen demands, pts. progress notes reviewed on E-chart, RN spoken with afterwards to be able to convey best care therapy choice to pt., RT to monitor.

## 2020-04-21 NOTE — Progress Notes (Signed)
Nutrition Follow-up  DOCUMENTATION CODES:   Obesity unspecified  INTERVENTION:  - will decrease [employer] 1.4 po from BID to once/day. - weigh patient today.   NUTRITION DIAGNOSIS:   Increased nutrient needs related to acute illness, catabolic illness (COVID-19 PNA) as evidenced by estimated needs. -ongoing  GOAL:   Patient will meet greater than or equal to 90% of their needs -met on average  MONITOR:   PO intake, Supplement acceptance, Labs, Weight trends  ASSESSMENT:   64 y.o. female with medical history of morbid obesity, OSA not on CPAP, GAD, GERD, and recent COVID-19 diagnosis at PCP office. She presented to the ED 8/23 having been found passed out and in respiratory distress at home. In the ED, she was febrile, tachycardic, tachypneic 78% on 15L NRB with hypoxia. CT angio chest showed no PE but did show extensive bilateral parenchymal infiltrates consistent with COVID-19 PNA. Remdesivir, steroids, and baricitinib started.  She continues with 50-100% meal completion on average and has been accepting [employer] ~50% of the time offered. Last recorded weight was on 8/30 and weight had been trending down since admission on 8/23.     Labs reviewed; BUN: 26 mg/dl. Medications reviewed; 500 mg ascorbic acid/day, 40 mg solu-medrol/day, 1 tablet multivitamin with minerals/day, 5 ml oral mycostatin QID, 220 mg zinc sulfate/day.    Diet Order:   Diet Order            Diet Carb Modified Fluid consistency: Thin; Room service appropriate? Yes  Diet effective now                 EDUCATION NEEDS:   No education needs have been identified at this time  Skin:  Skin Assessment: Reviewed RN Assessment  Last BM:  9/6  Height:   Ht Readings from Last 1 Encounters:  04/12/20 5' 3" (1.6 m)    Weight:   Wt Readings from Last 1 Encounters:  04/12/20 95.6 kg     Estimated Nutritional Needs:  Kcal:  1880-2075 kcal Protein:  100-110 grams Fluid:  >/= 2  L/day      , MS, RD, LDN, CNSC Inpatient Clinical Dietitian RD pager # available in [facility]  After hours/weekend pager # available in [facility]  

## 2020-04-21 NOTE — Progress Notes (Signed)
PROGRESS NOTE    Tiffany Coleman  TRZ:[id number] DOB: 23-Feb-1956 DOA: 04/05/2020 PCP: Nolene Ebbs, MD    Brief Narrative:  Tiffany Coleman is a 64 y.o. female with a history of morbid obesity, OSA not on CPAP, GAD, GERD, and recent covid-19 diagnosis at PCP office who presented to the ED 8/23 having been found passed out and in respiratory distress at home. In the ED, she was febrile, tachycardic, tachypneic 78% on 15L NRB with hypoxia confirmed pO2 27 on VBG. CTA chest was performed showing no PE but did reveal extensive bilateral parenchymal infiltrates consistent with covid-19 pneumonia. Remdesivir, steroids, and baricitinib started and the patient was admitted to medicine service after consultation with PCCM. 9/5, remains in the hospital with persistent high oxygen requirement anywhere between 6 L to 15 L. [facility] declined admission. Cannot go to skilled nursing facility because she is on more than 6 L oxygen.  Assessment & Plan:   Active Problems:   Obstructive sleep apnea   Generalized anxiety disorder   Morbid obesity (HCC)   Hypertension   GERD (gastroesophageal reflux disease)   Acute hypoxemic respiratory failure due to COVID-19 [facility])  Acute hypoxemic respiratory failure due to COVID-19 pneumonia: Continue to monitor due to significant symptoms  chest physiotherapy, incentive spirometry, deep breathing exercises, sputum induction, mucolytic's and bronchodilators. Supplemental oxygen to keep saturations more than 85 %. Covid directed therapy with , steroids, high-dose Solu-Medrol, tapering off. remdesivir, completed 5 days of therapy Baricitinib, started 8/25-8/8, she will finish 2 weeks of baricitinib today.   COVID-19 Labs  No results for input(s): DDIMER, FERRITIN, LDH, CRP in the last 72 hours.  Lab Results  Component Value Date   SARSCOV2NAA POSITIVE (A) 04/05/2020   SpO2: 94 % O2 Flow Rate (L/min): 15 L/min FiO2 (%): 94 % (decreased HFNC  to 12 LPM)  Mild intermittent asthma: stable , complicating recovery from pneumonia.  Obstructive sleep apnea, not using CPAP at home.  Complicating oxygen requirement. Patient has been noncompliant to using BiPAP event in the hospital.  We discussed in detail.  She has untreated sleep apnea that is complicating her recovery and oxygen requirement. Patient is convinced to try CPAP tonight.  Discussed with respiratory therapist to give her CPAP tonight.  Acute kidney injury: Improved.  Normalized.  Abnormal LFT: Improving.  Hepatic Function Latest Ref Rng & Units 04/15/2020 04/14/2020 04/13/2020  Total Protein 6.5 - 8.1 g/dL 6.6 6.8 6.5  Albumin 3.5 - 5.0 g/dL 2.9(L) 2.9(L) 3.0(L)  AST 15 - 41 U/L 26 34 39  ALT 0 - 44 U/L 78(H) 83(H) 86(H)  Alk Phosphatase 38 - 126 U/L 78 76 89  Total Bilirubin 0.3 - 1.2 mg/dL 0.7 1.0 0.7     DVT prophylaxis: Lovenox subcu   Code Status: full code  Family Communication: daughter on the phone  Disposition Plan: Status is: Inpatient  Remains inpatient appropriate because:Inpatient level of care appropriate due to severity of illness   Dispo: The patient is from: Home              Anticipated d/c is to: Unknown.              Anticipated d/c date is: Unknown.              Patient currently is not medically stable to d/c.  Unsafe discharge plan.   Consultants:   None  Procedures:   None  Antimicrobials:  Antibiotics Given (last 72 hours)    None  Subjective: Patient seen and examined.  Was on 15 L of oxygen.  She was very anxious when she got out of the bed and her oxygen saturations dropped even on oxygen.  Cannot tolerate any weaning from oxygen.  No other overnight events. They did not offer CPAP to her last night.  Objective: Vitals:   04/21/20 0500 04/21/20 0756 04/21/20 1000 04/21/20 1250  BP: 106/63  126/80 122/84  Pulse: 96  91 89  Resp: 20     Temp: 98.6 F (37 C)   98.9 F (37.2 C)  TempSrc: Oral   Oral  SpO2:  97%  93% 94%  Weight:  94.1 kg    Height:        Intake/Output Summary (Last 24 hours) at 04/21/2020 1553 Last data filed at 04/21/2020 1353 Gross per 24 hour  Intake 415 ml  Output 700 ml  Net -285 ml   Filed Weights   04/09/20 0550 04/12/20 1727 04/21/20 0756  Weight: 99.1 kg 95.6 kg 94.1 kg    Examination: Physical Exam Constitutional:      Appearance: She is ill-appearing. She is not toxic-appearing.     Comments: Looks comfortable at rest.  Slightly anxious in mild increased work of breathing on talking.  She was on 14 L oxygen on my evaluation.  HENT:     Mouth/Throat:     Mouth: Mucous membranes are moist.     Pharynx: Oropharynx is clear.  Cardiovascular:     Rate and Rhythm: Regular rhythm.  Pulmonary:     Effort: Tachypnea present.     Breath sounds: Normal breath sounds.     Comments: No added sounds.  Poor bilateral air entry. Abdominal:     General: Bowel sounds are normal.     Palpations: Abdomen is soft.  Neurological:     General: No focal deficit present.     Mental Status: She is alert and oriented to person, place, and time.     Comments: Anxious.  Psychiatric:        Mood and Affect: Mood is not anxious.        Behavior: Behavior is not agitated.      Data Reviewed: I have personally reviewed following labs and imaging studies  CBC: Recent Labs  Lab 04/15/20 0445 04/21/20 0451  WBC 10.9* 11.1*  NEUTROABS  --  9.5*  HGB 12.5 11.6*  HCT 40.9 37.1  MCV 87.8 87.7  PLT 260 354   Basic Metabolic Panel: Recent Labs  Lab 04/15/20 0445 04/21/20 0451  NA 135 140  K 4.6 4.6  CL 100 99  CO2 26 31  GLUCOSE 210* 128*  BUN 29* 26*  CREATININE 0.87 0.88  CALCIUM 9.7 9.9  MG  --  2.1  PHOS  --  3.9   GFR: Estimated Creatinine Clearance: 68.6 mL/min (by C-G formula based on SCr of 0.88 mg/dL). Liver Function Tests: Recent Labs  Lab 04/15/20 0445  AST 26  ALT 78*  ALKPHOS 78  BILITOT 0.7  PROT 6.6  ALBUMIN 2.9*   No results for  input(s): LIPASE, AMYLASE in the last 168 hours. No results for input(s): AMMONIA in the last 168 hours. Coagulation Profile: No results for input(s): INR, PROTIME in the last 168 hours. Cardiac Enzymes: No results for input(s): CKTOTAL, CKMB, CKMBINDEX, TROPONINI in the last 168 hours. BNP (last 3 results) No results for input(s): PROBNP in the last 8760 hours. HbA1C: No results for input(s): HGBA1C in the last 72 hours.  CBG: Recent Labs  Lab 04/20/20 0738 04/20/20 1136 04/20/20 1637 04/20/20 2048 04/21/20 1243  GLUCAP 99 110* 204* 204* 125*   Lipid Profile: No results for input(s): CHOL, HDL, LDLCALC, TRIG, CHOLHDL, LDLDIRECT in the last 72 hours. Thyroid Function Tests: No results for input(s): TSH, T4TOTAL, FREET4, T3FREE, THYROIDAB in the last 72 hours. Anemia Panel: No results for input(s): VITAMINB12, FOLATE, FERRITIN, TIBC, IRON, RETICCTPCT in the last 72 hours. Sepsis Labs: No results for input(s): PROCALCITON, LATICACIDVEN in the last 168 hours.  No results found for this or any previous visit (from the past 240 hour(s)).       Radiology Studies: No results found.      Scheduled Meds: . vitamin C  500 mg Oral Daily  . dextromethorphan-guaiFENesin  1 tablet Oral BID  . enoxaparin (LOVENOX) injection  50 mg Subcutaneous Q12H  . feeding supplement ([employer] STANDARD 1.4)  325 mL Oral Daily  . Ipratropium-Albuterol  1 puff Inhalation Q6H  . mouth rinse  15 mL Mouth Rinse BID  . methylPREDNISolone (SOLU-MEDROL) injection  40 mg Intravenous Daily  . multivitamin with minerals  1 tablet Oral Daily  . nystatin  5 mL Oral QID  . oxymetazoline  1 spray Each Nare BID  . pantoprazole  40 mg Oral Daily  . zinc sulfate  220 mg Oral Daily   Continuous Infusions:   LOS: 16 days    Time spent: 30 minutes    Barb Merino, MD [facility] Pager [phone]

## 2020-04-22 ENCOUNTER — Inpatient Hospital Stay (HOSPITAL_COMMUNITY): Payer: Medicare Other

## 2020-04-22 LAB — GLUCOSE, CAPILLARY
Glucose-Capillary: 100 mg/dL — ABNORMAL HIGH (ref 70–99)
Glucose-Capillary: 156 mg/dL — ABNORMAL HIGH (ref 70–99)
Glucose-Capillary: 191 mg/dL — ABNORMAL HIGH (ref 70–99)
Glucose-Capillary: 155 mg/dL — ABNORMAL HIGH (ref 70–99)

## 2020-04-22 MED ORDER — MOMETASONE FURO-FORMOTEROL FUM 200-5 MCG/ACT IN AERO
2.0000 | INHALATION_SPRAY | Freq: Two times a day (BID) | RESPIRATORY_TRACT | Status: DC
  Administered 2020-04-22 – 2020-05-11 (×38): 2 via RESPIRATORY_TRACT
  Filled 2020-04-22: qty 8.8

## 2020-04-22 NOTE — Progress Notes (Signed)
PROGRESS NOTE    Tiffany Coleman  OIB:[id number] DOB: [birth year] DOA: 04/05/2020 PCP: Nolene Ebbs, MD    Brief Narrative:  Tiffany Coleman is a 64 y.o. female with a history of morbid obesity, OSA not on CPAP, GAD, GERD, and recent covid-19 diagnosis at PCP office who presented to the ED 8/23 having been found passed out and in respiratory distress at home. In the ED, she was febrile, tachycardic, tachypneic 78% on 15L NRB with hypoxia confirmed pO2 27 on VBG. CTA chest was performed showing no PE but did reveal extensive bilateral parenchymal infiltrates consistent with covid-19 pneumonia. Remdesivir, steroids, and baricitinib started and the patient was admitted to medicine service after consultation with PCCM.  9/5, remains in the hospital with persistent high oxygen requirement anywhere between 6 L to 15 L. [facility] declined admission. Cannot go to skilled nursing facility because she is on more than 6 L oxygen. At rest, she is maintaining on 13 to 15 L of oxygen, does not tolerate mobility. Unable to use CPAP due to hospital policy, multiple attempts and requests to use CPAP at night not useful.  Assessment & Plan:   Active Problems:   Obstructive sleep apnea   Generalized anxiety disorder   Morbid obesity (HCC)   Hypertension   GERD (gastroesophageal reflux disease)   Acute hypoxemic respiratory failure due to COVID-19 [facility])  Acute hypoxemic respiratory failure due to COVID-19 pneumonia: Continue to monitor due to significant symptoms  chest physiotherapy, incentive spirometry, deep breathing exercises, sputum induction, mucolytic's and bronchodilators. Supplemental oxygen to keep saturations more than 85 %. Covid directed therapy with , steroids, high-dose Solu-Medrol, tapering off.  We will continue on maintenance steroid. remdesivir, completed 5 days of therapy Baricitinib, started 8/25-8/8, finished 2 weeks therapy. On albuterol inhaler. Will add some  steroid inhalers today see if that helps.   COVID-19 Labs  No results for input(s): DDIMER, FERRITIN, LDH, CRP in the last 72 hours.  Lab Results  Component Value Date   SARSCOV2NAA POSITIVE (A) 04/05/2020   SpO2: 98 % O2 Flow Rate (L/min): 15 L/min FiO2 (%): 94 % (decreased HFNC to 12 LPM)  Mild intermittent asthma: stable , complicating recovery from pneumonia.  Obstructive sleep apnea, not using CPAP at home.  Complicating oxygen requirement. Patient has been noncompliant to using BiPAP event in the hospital.  We discussed in detail.  She has untreated sleep apnea that is complicating her recovery and oxygen requirement. Respiratory therapy not convinced to use CPAP at night.  Acute kidney injury: Improved.  Normalized.  Abnormal LFT: Improving.  Normalized.  Hepatic Function Latest Ref Rng & Units 04/15/2020 04/14/2020 04/13/2020  Total Protein 6.5 - 8.1 g/dL 6.6 6.8 6.5  Albumin 3.5 - 5.0 g/dL 2.9(L) 2.9(L) 3.0(L)  AST 15 - 41 U/L 26 34 39  ALT 0 - 44 U/L 78(H) 83(H) 86(H)  Alk Phosphatase 38 - 126 U/L 78 76 89  Total Bilirubin 0.3 - 1.2 mg/dL 0.7 1.0 0.7     DVT prophylaxis: Lovenox subcu   Code Status: full code  Family Communication: daughter Ms. Ronnald Ramp on the phone  Disposition Plan: Status is: Inpatient  Remains inpatient appropriate because:Inpatient level of care appropriate due to severity of illness   Dispo: The patient is from: Home              Anticipated d/c is to: Unknown.              Anticipated d/c date is: Unknown.  Patient currently is not medically stable to d/c.  Unsafe discharge plan.   Consultants:   None  Procedures:   None  Antimicrobials:  Antibiotics Given (last 72 hours)    None         Subjective: Patient seen and examined. Patient on about 13 to 15 L of oxygen at rest.  She cannot tolerate any mobility.  Drops down to 70% and gets dyspneic on mobility. Respiratory therapy department declined to give a trial  of CPAP at night.  Objective: Vitals:   04/21/20 1000 04/21/20 1250 04/21/20 2138 04/22/20 0512  BP: 126/80 122/84 130/77 (!) 106/59  Pulse: 91 89 (!) 106 85  Resp:   20 (!) 24  Temp:  98.9 F (37.2 C) 99 F (37.2 C) 98.4 F (36.9 C)  TempSrc:  Oral Oral Oral  SpO2: 93% 94% 93% 98%  Weight:      Height:        Intake/Output Summary (Last 24 hours) at 04/22/2020 1140 Last data filed at 04/22/2020 0516 Gross per 24 hour  Intake 535 ml  Output 2400 ml  Net -1865 ml   Filed Weights   04/09/20 0550 04/12/20 1727 04/21/20 0756  Weight: 99.1 kg 95.6 kg 94.1 kg    Examination: Physical Exam Constitutional:      Appearance: She is not toxic-appearing.     Comments: SurgeonLooks comfortable at complete rest.  On 13 to 15 L of.  Cannot tolerate any mobility.  Can talk in complete sentences while resting.  Getting to the bedside commode was stressful.  HENT:     Mouth/Throat:     Mouth: Mucous membranes are moist.     Pharynx: Oropharynx is clear.  Cardiovascular:     Rate and Rhythm: Normal rate and regular rhythm.  Pulmonary:     Comments: Poor bilateral air entry.  No added sounds. Abdominal:     General: Bowel sounds are normal.     Palpations: Abdomen is soft.  Neurological:     General: No focal deficit present.     Mental Status: She is alert and oriented to person, place, and time.     Comments: Anxious.  Psychiatric:        Mood and Affect: Mood is not anxious.        Behavior: Behavior is not agitated.      Data Reviewed: I have personally reviewed following labs and imaging studies  CBC: Recent Labs  Lab 04/21/20 0451  WBC 11.1*  NEUTROABS 9.5*  HGB 11.6*  HCT 37.1  MCV 87.7  PLT 166   Basic Metabolic Panel: Recent Labs  Lab 04/21/20 0451  NA 140  K 4.6  CL 99  CO2 31  GLUCOSE 128*  BUN 26*  CREATININE 0.88  CALCIUM 9.9  MG 2.1  PHOS 3.9   GFR: Estimated Creatinine Clearance: 68.6 mL/min (by C-G formula based on SCr of 0.88  mg/dL). Liver Function Tests: No results for input(s): AST, ALT, ALKPHOS, BILITOT, PROT, ALBUMIN in the last 168 hours. No results for input(s): LIPASE, AMYLASE in the last 168 hours. No results for input(s): AMMONIA in the last 168 hours. Coagulation Profile: No results for input(s): INR, PROTIME in the last 168 hours. Cardiac Enzymes: No results for input(s): CKTOTAL, CKMB, CKMBINDEX, TROPONINI in the last 168 hours. BNP (last 3 results) No results for input(s): PROBNP in the last 8760 hours. HbA1C: No results for input(s): HGBA1C in the last 72 hours. CBG: Recent Labs  Lab 04/20/20  2048 04/21/20 1243 04/21/20 1700 04/21/20 2133 04/22/20 0743  GLUCAP 204* 125* 219* 193* 100*   Lipid Profile: No results for input(s): CHOL, HDL, LDLCALC, TRIG, CHOLHDL, LDLDIRECT in the last 72 hours. Thyroid Function Tests: No results for input(s): TSH, T4TOTAL, FREET4, T3FREE, THYROIDAB in the last 72 hours. Anemia Panel: No results for input(s): VITAMINB12, FOLATE, FERRITIN, TIBC, IRON, RETICCTPCT in the last 72 hours. Sepsis Labs: No results for input(s): PROCALCITON, LATICACIDVEN in the last 168 hours.  No results found for this or any previous visit (from the past 240 hour(s)).       Radiology Studies: DG CHEST PORT 1 VIEW  Result Date: 04/22/2020 CLINICAL DATA:  Respiratory failure.  COVID-19 positive EXAM: PORTABLE CHEST 1 VIEW COMPARISON:  April 05, 2020 FINDINGS: There is widespread airspace opacity bilaterally, slightly less than on previous study overall. Heart size and pulmonary vascularity are normal. No adenopathy. No bone lesions. IMPRESSION: There remains widespread airspace opacity bilaterally, somewhat less pronounced than on prior study, particularly in the left upper lobe. Suspect persistent atypical organism multifocal pneumonia. Heart size within normal limits.  No adenopathy appreciable. Electronically Signed   By: Lowella Grip III M.D.   On: 04/22/2020 08:07         Scheduled Meds: . vitamin C  500 mg Oral Daily  . dextromethorphan-guaiFENesin  1 tablet Oral BID  . enoxaparin (LOVENOX) injection  50 mg Subcutaneous Q12H  . feeding supplement ([employer] STANDARD 1.4)  325 mL Oral Daily  . Ipratropium-Albuterol  1 puff Inhalation Q6H  . mouth rinse  15 mL Mouth Rinse BID  . methylPREDNISolone (SOLU-MEDROL) injection  40 mg Intravenous Daily  . mometasone-formoterol  2 puff Inhalation BID  . multivitamin with minerals  1 tablet Oral Daily  . nystatin  5 mL Oral QID  . oxymetazoline  1 spray Each Nare BID  . pantoprazole  40 mg Oral Daily  . zinc sulfate  220 mg Oral Daily   Continuous Infusions:   LOS: 17 days    Time spent: 30 minutes    Barb Merino, MD [facility] Pager [phone]

## 2020-04-23 DIAGNOSIS — R739 Hyperglycemia, unspecified: Secondary | ICD-10-CM

## 2020-04-23 LAB — GLUCOSE, CAPILLARY
Glucose-Capillary: 103 mg/dL — ABNORMAL HIGH (ref 70–99)
Glucose-Capillary: 130 mg/dL — ABNORMAL HIGH (ref 70–99)
Glucose-Capillary: 225 mg/dL — ABNORMAL HIGH (ref 70–99)
Glucose-Capillary: 224 mg/dL — ABNORMAL HIGH (ref 70–99)

## 2020-04-23 LAB — BASIC METABOLIC PANEL
Anion gap: 11 (ref 5–15)
BUN: 22 mg/dL (ref 8–23)
CO2: 31 mmol/L (ref 22–32)
Calcium: 9.9 mg/dL (ref 8.9–10.3)
Chloride: 96 mmol/L — ABNORMAL LOW (ref 98–111)
Creatinine, Ser: 0.88 mg/dL (ref 0.44–1.00)
GFR calc Af Amer: >60 mL/min (ref >60)
GFR calc non Af Amer: >60 mL/min (ref >60)
Glucose, Bld: 151 mg/dL — ABNORMAL HIGH (ref 70–99)
Potassium: 4.7 mmol/L (ref 3.5–5.1)
Sodium: 138 mmol/L (ref 135–145)

## 2020-04-23 MED ORDER — FUROSEMIDE 10 MG/ML IJ SOLN
40.0000 mg | Freq: Once | INTRAMUSCULAR | Status: AC
  Administered 2020-04-23: 40 mg via INTRAVENOUS
  Filled 2020-04-23: qty 4

## 2020-04-23 NOTE — Progress Notes (Addendum)
PROGRESS NOTE    Tiffany Coleman  OHY:[id number] DOB: 16-Sep-1955 DOA: 04/05/2020 PCP: Nolene Ebbs, MD    Brief Narrative:  Tiffany Coleman is a 64 y.o. female with a history of morbid obesity, OSA not on CPAP, GAD, GERD, and recent covid-19 diagnosis at PCP office who presented to the ED 8/23 having been found passed out and in respiratory distress at home. In the ED, she was febrile, tachycardic, tachypneic 78% on 15L NRB with hypoxia confirmed pO2 27 on VBG. CTA chest was performed showing no PE but did reveal extensive bilateral parenchymal infiltrates consistent with covid-19 pneumonia. Remdesivir, steroids, and baricitinib started and the patient was admitted to medicine service after consultation with PCCM.  9/5, remains in the hospital with persistent high oxygen requirement anywhere between 6 L to 15 L. [facility] declined admission. Cannot go to skilled nursing facility because she is on more than 6 L oxygen. At rest, she is maintaining on 13 to 15 L of oxygen, does not tolerate mobility. Unable to use CPAP due to hospital policy, multiple attempts and requests to use CPAP at night not useful.  Assessment & Plan:   Active Problems:   Obstructive sleep apnea   Generalized anxiety disorder   Morbid obesity ([place])   Hypertension   GERD (gastroesophageal reflux disease)   Acute hypoxemic respiratory failure due to COVID-19 [facility])    Acute hypoxemic respiratory failure due to COVID-19 pneumonia: From a respiratory standpoint patient remains tenuous.  Still requiring about 12 to 15 L of oxygen by high flow nasal cannula.  Her oxygen saturations are noted to be in the mid to late 80s.  Dropped down to 12 L this morning.  Discussed with nursing staff will continue to wean down.  Patient has completed course of Remdesivir and baricitinib.  Remains on Solu-Medrol. Continue with incentive spirometry mobilization. We will deliver a dose of Lasix to try and keep her  negative fluid balance.  Continue with the inhalers.  Mild intermittent asthma Stable.  No wheezing appreciated at this time.  Obstructive sleep apnea, not using CPAP at home Complicating oxygen requirement. Patient has been noncompliant to using BiPAP event in the hospital.  She has untreated sleep apnea that is complicating her recovery and oxygen requirement.  Acute kidney injury Improved.  Normalized.  Abnormal LFT Likely secondary to COVID-19.  Monitor periodically.    Hyperglycemia Secondary to steroids.  No known history of diabetes.  Last HbA1c is from 2020 when it was 5.5.  Recheck it tomorrow.   DVT prophylaxis: Lovenox subcu Code Status: full code  Family Communication: We will update daughter later today Disposition Plan: Hopefully return home when improved  Status is: Inpatient  Remains inpatient appropriate because:IV treatments appropriate due to intensity of illness or inability to take PO and Inpatient level of care appropriate due to severity of illness   Dispo:  Patient From: Home  Planned Disposition: [place]  Expected discharge date: 04/25/20  Medically stable for discharge: No     Consultants:   None  Procedures:   None  Antimicrobials:  Antibiotics Given (last 72 hours)    None         Subjective: Patient states that she continues to feel short of breath mainly with minimal exertion.  No chest pain.  Occasional dry cough.  Seems to be anxious.  Objective: Vitals:   04/22/20 0512 04/22/20 1607 04/22/20 1942 04/23/20 0500  BP: (!) 106/59 114/82 118/80 122/73  Pulse: 85 92 85  89  Resp: (!) _0 Temp: 98.4 F (36.9 C) 98.4 F (36.9 C) 98.2 F (36.8 C) 98 F (36.7 C)  TempSrc: Oral Oral Oral Oral  SpO2: 98% 98% 100% 96%  Weight:      Height:        Intake/Output Summary (Last 24 hours) at 04/23/2020 1159 Last data filed at 04/23/2020 1107 Gross per 24 hour  Intake --  Output 2125 ml  Net -2125 ml    Filed Weights   04/09/20 0550 04/12/20 1727 04/21/20 0756  Weight: 99.1 kg 95.6 kg 94.1 kg    Examination:   General appearance: Awake alert.  In no distress Resp: Mildly tachypneic.  Coarse breath sounds bilaterally.  No wheezing or rhonchi.crackles bilateral bases.   Cardio: S1-S2 is normal regular.  No S3-S4.  No rubs murmurs or bruit GI: Abdomen is soft.  Nontender nondistended.  Bowel sounds are present normal.  No masses organomegaly Extremities: No edema.  Full range of motion of lower extremities. Neurologic: Alert and oriented x3.  No focal neurological deficits.    Data Reviewed: I have personally reviewed following labs and imaging studies  CBC: Recent Labs  Lab 04/21/20 0451  WBC 11.1*  NEUTROABS 9.5*  HGB 11.6*  HCT 37.1  MCV 87.7  PLT 841   Basic Metabolic Panel: Recent Labs  Lab 04/21/20 0451  NA 140  K 4.6  CL 99  CO2 31  GLUCOSE 128*  BUN 26*  CREATININE 0.88  CALCIUM 9.9  MG 2.1  PHOS 3.9   GFR: Estimated Creatinine Clearance: 68.6 mL/min (by C-G formula based on SCr of 0.88 mg/dL).  CBG: Recent Labs  Lab 04/22/20 1158 04/22/20 1603 04/22/20 1939 04/23/20 0738 04/23/20 1103  GLUCAP 156* 191* 155* 103* 130*      Radiology Studies: DG CHEST PORT 1 VIEW  Result Date: 04/22/2020 CLINICAL DATA:  Respiratory failure.  COVID-19 positive EXAM: PORTABLE CHEST 1 VIEW COMPARISON:  April 05, 2020 FINDINGS: There is widespread airspace opacity bilaterally, slightly less than on previous study overall. Heart size and pulmonary vascularity are normal. No adenopathy. No bone lesions. IMPRESSION: There remains widespread airspace opacity bilaterally, somewhat less pronounced than on prior study, particularly in the left upper lobe. Suspect persistent atypical organism multifocal pneumonia. Heart size within normal limits.  No adenopathy appreciable. Electronically Signed   By: Lowella Grip III M.D.   On: 04/22/2020 08:07     Scheduled  Meds: . vitamin C  500 mg Oral Daily  . dextromethorphan-guaiFENesin  1 tablet Oral BID  . enoxaparin (LOVENOX) injection  50 mg Subcutaneous Q12H  . feeding supplement (KATE FARMS STANDARD 1.4)  325 mL Oral Daily  . Ipratropium-Albuterol  1 puff Inhalation Q6H  . mouth rinse  15 mL Mouth Rinse BID  . methylPREDNISolone (SOLU-MEDROL) injection  40 mg Intravenous Daily  . mometasone-formoterol  2 puff Inhalation BID  . multivitamin with minerals  1 tablet Oral Daily  . nystatin  5 mL Oral QID  . oxymetazoline  1 spray Each Nare BID  . pantoprazole  40 mg Oral Daily  . zinc sulfate  220 mg Oral Daily   Continuous Infusions:   LOS: 18 days     Bonnielee Haff, MD [facility]

## 2020-04-23 NOTE — Progress Notes (Signed)
Patient told RN that after receiving Lasix dose, she feels so much better everywhere, particularly related to her breathing. RN educated patient about how Lasix works and the importance of monitoring fluid intake as well.  Patient stated understanding.

## 2020-04-23 NOTE — Care Management Important Message (Signed)
Important Message  Patient Details IM Letter given to the Patient Name: Tiffany Coleman MRN: [id number] Date of Birth: 10-18-1955   Medicare Important Message Given:  Yes     Kerin Salen 04/23/2020, 11:39 AM

## 2020-04-24 ENCOUNTER — Inpatient Hospital Stay (HOSPITAL_COMMUNITY): Payer: Medicare Other

## 2020-04-24 DIAGNOSIS — J9601 Acute respiratory failure with hypoxia: Secondary | ICD-10-CM

## 2020-04-24 DIAGNOSIS — I10 Essential (primary) hypertension: Secondary | ICD-10-CM

## 2020-04-24 LAB — COMPREHENSIVE METABOLIC PANEL
ALT: 39 U/L (ref 0–44)
AST: 20 U/L (ref 15–41)
Albumin: 3.1 g/dL — ABNORMAL LOW (ref 3.5–5.0)
Alkaline Phosphatase: 54 U/L (ref 38–126)
Anion gap: 13 (ref 5–15)
BUN: 29 mg/dL — ABNORMAL HIGH (ref 8–23)
CO2: 33 mmol/L — ABNORMAL HIGH (ref 22–32)
Calcium: 10 mg/dL (ref 8.9–10.3)
Chloride: 94 mmol/L — ABNORMAL LOW (ref 98–111)
Creatinine, Ser: 0.81 mg/dL (ref 0.44–1.00)
GFR calc Af Amer: >60 mL/min (ref >60)
GFR calc non Af Amer: >60 mL/min (ref >60)
Glucose, Bld: 145 mg/dL — ABNORMAL HIGH (ref 70–99)
Potassium: 4.5 mmol/L (ref 3.5–5.1)
Sodium: 140 mmol/L (ref 135–145)
Total Bilirubin: 0.9 mg/dL (ref 0.3–1.2)
Total Protein: 6.2 g/dL — ABNORMAL LOW (ref 6.5–8.1)

## 2020-04-24 LAB — CBC
HCT: 35.9 % — ABNORMAL LOW (ref 36.0–46.0)
Hemoglobin: 11.3 g/dL — ABNORMAL LOW (ref 12.0–15.0)
MCH: 27.7 pg (ref 26.0–34.0)
MCHC: 31.5 g/dL (ref 30.0–36.0)
MCV: 88 fL (ref 80.0–100.0)
Platelets: 203 10*3/uL (ref 150–400)
RBC: 4.08 MIL/uL (ref 3.87–5.11)
RDW: 15.5 % (ref 11.5–15.5)
WBC: 9.1 10*3/uL (ref 4.0–10.5)
nRBC: 0 % (ref 0.0–0.2)

## 2020-04-24 LAB — ECHOCARDIOGRAM COMPLETE
Area-P 1/2: 2.69 cm2
Height: 63 in
S' Lateral: 2.7 cm
Weight: 3319.25 oz

## 2020-04-24 LAB — GLUCOSE, CAPILLARY
Glucose-Capillary: 106 mg/dL — ABNORMAL HIGH (ref 70–99)
Glucose-Capillary: 173 mg/dL — ABNORMAL HIGH (ref 70–99)
Glucose-Capillary: 218 mg/dL — ABNORMAL HIGH (ref 70–99)
Glucose-Capillary: 216 mg/dL — ABNORMAL HIGH (ref 70–99)

## 2020-04-24 LAB — BRAIN NATRIURETIC PEPTIDE: B Natriuretic Peptide: 134.6 pg/mL — ABNORMAL HIGH (ref 0.0–100.0)

## 2020-04-24 MED ORDER — FUROSEMIDE 10 MG/ML IJ SOLN
20.0000 mg | Freq: Two times a day (BID) | INTRAMUSCULAR | Status: DC
  Administered 2020-04-24 – 2020-05-05 (×22): 20 mg via INTRAVENOUS
  Filled 2020-04-24 (×22): qty 2

## 2020-04-24 MED ORDER — FUROSEMIDE 10 MG/ML IJ SOLN
20.0000 mg | Freq: Once | INTRAMUSCULAR | Status: AC
  Administered 2020-04-24: 20 mg via INTRAVENOUS
  Filled 2020-04-24: qty 2

## 2020-04-24 NOTE — Progress Notes (Signed)
  Echocardiogram 2D Echocardiogram has been performed.  Tiffany Coleman 04/24/2020, 2:28 PM

## 2020-04-24 NOTE — Progress Notes (Signed)
PROGRESS NOTE    Tiffany Coleman  KDT:[id number] DOB: 01-31-1956 DOA: 04/05/2020 PCP: Nolene Ebbs, MD    Brief Narrative:  Tiffany Coleman is a 64 y.o. female with a history of morbid obesity, OSA not on CPAP, with recent Covid positive test who presented to the emergency room on 8/23 after being found passed out and in respiratory distress at home.  In the emergency room, noted to be feverish tachycardic and tachypneic and acute respiratory failure requiring 15 L nonrebreather.  CT scan of chest noted diffuse infiltrates consistent with Covid pneumonia although no evidence of PE.  Patient was admitted to the hospitalist service and started on Remdisivir, steroids and baricitinib.  Following hospitalization, patient continues to require high levels of oxygen up to as high as 15 L.  She received some Lasix and over the last 24 hours, her oxygenation requirements have come down to about 10 L.  Interestingly, she had an echocardiogram done on 9/11 which was unremarkable.  She is feeling tired, but a little bit better today.  Assessment & Plan:   Active Problems:   Obstructive sleep apnea   Generalized anxiety disorder   Morbid obesity ([place])   Hypertension   GERD (gastroesophageal reflux disease)   Acute hypoxemic respiratory failure due to COVID-19 [facility])    Acute hypoxemic respiratory failure due to COVID-19 pneumonia: From a respiratory standpoint patient remains tenuous although some improvement today.  I suspect on admission, she received aggressive fluid resuscitation as since hospitalization, she is almost 19 L negative but likely received more than that when first coming in.  Interestingly, echocardiogram notes no evidence of underlying heart failure.  We will continue Lasix and oxygen support and continue Solu-Medrol.  Hopefully she will continue to improve.  Patient has completed course of Remdesivir and baricitinib.  Remains on Solu-Medrol. [facility] declined  admission. Cannot go to skilled nursing facility because she is on more than 6 L oxygen. At rest, she is maintaining on 13 to 15 L of oxygen, does not tolerate mobility. Unable to use CPAP due to hospital policy, multiple attempts and requests to use CPAP at night not useful.  Mild intermittent asthma Stable.  No wheezing appreciated at this time.  Obesity: Patient meets criteria BMI greater than 30  Obstructive sleep apnea, not using CPAP at home Complicating oxygen requirement. Patient has been noncompliant to using BiPAP event in the hospital.  She has untreated sleep apnea that is complicating her recovery and oxygen requirement.  Acute kidney injury Improved.  Normalized.  Abnormal LFT Likely secondary to COVID-19.  Monitor periodically.    Hyperglycemia Secondary to steroids.  No known history of diabetes.  Last HbA1c is from 2020 when it was 5.5.  Recheck in the morning   DVT prophylaxis: Lovenox subcu Code Status: full code  Family Communication: Updated daughter by phone Disposition Plan: Hopefully return home when improved  Status is: Inpatient  Remains inpatient appropriate because:IV treatments appropriate due to intensity of illness or inability to take PO and Inpatient level of care appropriate due to severity of illness   Dispo:  Patient From: Home  Planned Disposition: Home  Expected discharge date: 04/26/20  Medically stable for discharge: No     Consultants:   None  Procedures:   None  Antimicrobials:  Antibiotics Given (last 72 hours)    None         Objective: Vitals:   04/24/20 0800 04/24/20 1129 04/24/20 1135 04/24/20 1509  BP:  (!) 148/81  Pulse:  (!) 105 90   Resp:  17    Temp:  98.4 F (36.9 C)    TempSrc:  Oral    SpO2: 99% (!) 84% 97% 93%  Weight:      Height:        Intake/Output Summary (Last 24 hours) at 04/24/2020 1858 Last data filed at 04/24/2020 1800 Gross per 24 hour  Intake 200 ml  Output 1100 ml  Net  -900 ml   Filed Weights   04/09/20 0550 04/12/20 1727 04/21/20 0756  Weight: 99.1 kg 95.6 kg 94.1 kg    Examination:   General appearance: Alert and oriented x3, fatigued Resp: Clear to auscultation bilaterally Cardio: Regular rate and rhythm, S1-S2 GI: Soft, nontender, nondistended, positive bowel sounds Extremities: No clubbing or cyanosis or edema Psychiatry: Appropriate, no evidence of psychoses Neurologic: No focal neurological deficits.    Data Reviewed: I have personally reviewed following labs and imaging studies  CBC: Recent Labs  Lab 04/21/20 0451 04/24/20 0531  WBC 11.1* 9.1  NEUTROABS 9.5*  --   HGB 11.6* 11.3*  HCT 37.1 35.9*  MCV 87.7 88.0  PLT 220 979   Basic Metabolic Panel: Recent Labs  Lab 04/21/20 0451 04/23/20 1306 04/24/20 0531  NA 140 138 140  K 4.6 4.7 4.5  CL 99 96* 94*  CO2 31 31 33*  GLUCOSE 128* 151* 145*  BUN 26* 22 29*  CREATININE 0.88 0.88 0.81  CALCIUM 9.9 9.9 10.0  MG 2.1  --   --   PHOS 3.9  --   --    GFR: Estimated Creatinine Clearance: 74.5 mL/min (by C-G formula based on SCr of 0.81 mg/dL).  CBG: Recent Labs  Lab 04/23/20 1607 04/23/20 1950 04/24/20 0744 04/24/20 1231 04/24/20 1707  GLUCAP 225* 224* 106* 173* 218*      Radiology Studies: ECHOCARDIOGRAM COMPLETE  Result Date: 04/24/2020    ECHOCARDIOGRAM REPORT   Patient Name:   Tiffany Coleman Date of Exam: 04/24/2020 Medical Rec #:  [id number]                Height:       63.0 in Accession #:    [id number]               Weight:       207.5 lb Date of Birth:  03/24/1956                BSA:          1.964 m Patient Age:    58 years                 BP:           110/74 mmHg Patient Gender: F                        HR:           88 bpm. Exam Location:  Inpatient Procedure: 2D Echo Indications:    428 CHF  History:        Patient has no prior history of Echocardiogram examinations.                 Risk Factors:Current Smoker and Hypertension. Covid +.   Sonographer:    Jannett Celestine RDCS (AE) Referring Phys: [id number] Mariesa Grieder K [facility]  Sonographer Comments: Image acquisition challenging due to patient body habitus. IMPRESSIONS  1. Left ventricular ejection fraction, by estimation, is 60 to  65%. The left ventricle has normal function. The left ventricle has no regional wall motion abnormalities. There is moderate concentric left ventricular hypertrophy. Left ventricular diastolic parameters were normal.  2. Right ventricular systolic function is normal. The right ventricular size is normal. Tricuspid regurgitation signal is inadequate for assessing PA pressure.  3. The pericardial effusion is anterior to the right ventricle.  4. The mitral valve is normal in structure. No evidence of mitral valve regurgitation. No evidence of mitral stenosis.  5. The aortic valve is tricuspid. Aortic valve regurgitation is not visualized. Mild aortic valve sclerosis is present, with no evidence of aortic valve stenosis.  6. The inferior vena cava is normal in size with greater than 50% respiratory variability, suggesting right atrial pressure of 3 mmHg. FINDINGS  Left Ventricle: Left ventricular ejection fraction, by estimation, is 60 to 65%. The left ventricle has normal function. The left ventricle has no regional wall motion abnormalities. The left ventricular internal cavity size was normal in size. There is  moderate concentric left ventricular hypertrophy. Left ventricular diastolic parameters were normal. Normal left ventricular filling pressure. Right Ventricle: The right ventricular size is normal. No increase in right ventricular wall thickness. Right ventricular systolic function is normal. Tricuspid regurgitation signal is inadequate for assessing PA pressure. Left Atrium: Left atrial size was normal in size. Right Atrium: Right atrial size was normal in size. Pericardium: Trivial pericardial effusion is present. The pericardial effusion is anterior to the right ventricle.  Mitral Valve: The mitral valve is normal in structure. No evidence of mitral valve regurgitation. No evidence of mitral valve stenosis. Tricuspid Valve: The tricuspid valve is normal in structure. Tricuspid valve regurgitation is not demonstrated. No evidence of tricuspid stenosis. Aortic Valve: The aortic valve is tricuspid. Aortic valve regurgitation is not visualized. Mild aortic valve sclerosis is present, with no evidence of aortic valve stenosis. Pulmonic Valve: The pulmonic valve was normal in structure. Pulmonic valve regurgitation is trivial. No evidence of pulmonic stenosis. Aorta: The aortic root is normal in size and structure. Venous: The inferior vena cava is normal in size with greater than 50% respiratory variability, suggesting right atrial pressure of 3 mmHg. IAS/Shunts: No atrial level shunt detected by color flow Doppler.  LEFT VENTRICLE PLAX 2D LVIDd:         4.00 cm  Diastology LVIDs:         2.70 cm  LV e' lateral:   12.70 cm/s LV PW:         1.40 cm  LV E/e' lateral: 4.5 LV IVS:        1.40 cm LVOT diam:     2.10 cm LV SV:         52 LV SV Index:   26 LVOT Area:     3.46 cm  LEFT ATRIUM         Index LA diam:    3.30 cm 1.68 cm/m  AORTIC VALVE LVOT Vmax:   93.30 cm/s LVOT Vmean:  65.900 cm/s LVOT VTI:    0.150 m  AORTA Ao Root diam: 3.20 cm MITRAL VALVE MV Area (PHT): 2.69 cm    SHUNTS MV Decel Time: 282 msec    Systemic VTI:  0.15 m MV E velocity: 56.60 cm/s  Systemic Diam: 2.10 cm MV A velocity: 66.80 cm/s MV E/A ratio:  0.85 Fransico Him MD Electronically signed by Fransico Him MD Signature Date/Time: 04/24/2020/4:11:40 PM    Final      Scheduled Meds: . vitamin C  500 mg Oral Daily  . dextromethorphan-guaiFENesin  1 tablet Oral BID  . enoxaparin (LOVENOX) injection  50 mg Subcutaneous Q12H  . feeding supplement (KATE FARMS STANDARD 1.4)  325 mL Oral Daily  . furosemide  20 mg Intravenous BID  . Ipratropium-Albuterol  1 puff Inhalation Q6H  . mouth rinse  15 mL Mouth Rinse BID   . methylPREDNISolone (SOLU-MEDROL) injection  40 mg Intravenous Daily  . mometasone-formoterol  2 puff Inhalation BID  . multivitamin with minerals  1 tablet Oral Daily  . nystatin  5 mL Oral QID  . oxymetazoline  1 spray Each Nare BID  . pantoprazole  40 mg Oral Daily  . zinc sulfate  220 mg Oral Daily   Continuous Infusions:   LOS: 19 days     Annita Brod, MD [facility]

## 2020-04-25 LAB — CBC
HCT: 36.6 % (ref 36.0–46.0)
Hemoglobin: 11.4 g/dL — ABNORMAL LOW (ref 12.0–15.0)
MCH: 27.4 pg (ref 26.0–34.0)
MCHC: 31.1 g/dL (ref 30.0–36.0)
MCV: 88 fL (ref 80.0–100.0)
Platelets: 177 10*3/uL (ref 150–400)
RBC: 4.16 MIL/uL (ref 3.87–5.11)
RDW: 15.5 % (ref 11.5–15.5)
WBC: 11.1 10*3/uL — ABNORMAL HIGH (ref 4.0–10.5)
nRBC: 0 % (ref 0.0–0.2)

## 2020-04-25 LAB — GLUCOSE, CAPILLARY
Glucose-Capillary: 123 mg/dL — ABNORMAL HIGH (ref 70–99)
Glucose-Capillary: 146 mg/dL — ABNORMAL HIGH (ref 70–99)
Glucose-Capillary: 253 mg/dL — ABNORMAL HIGH (ref 70–99)
Glucose-Capillary: 135 mg/dL — ABNORMAL HIGH (ref 70–99)

## 2020-04-25 LAB — COMPREHENSIVE METABOLIC PANEL
ALT: 46 U/L — ABNORMAL HIGH (ref 0–44)
AST: 20 U/L (ref 15–41)
Albumin: 3.3 g/dL — ABNORMAL LOW (ref 3.5–5.0)
Alkaline Phosphatase: 58 U/L (ref 38–126)
Anion gap: 13 (ref 5–15)
BUN: 31 mg/dL — ABNORMAL HIGH (ref 8–23)
CO2: 33 mmol/L — ABNORMAL HIGH (ref 22–32)
Calcium: 10.1 mg/dL (ref 8.9–10.3)
Chloride: 92 mmol/L — ABNORMAL LOW (ref 98–111)
Creatinine, Ser: 0.81 mg/dL (ref 0.44–1.00)
GFR calc Af Amer: >60 mL/min (ref >60)
GFR calc non Af Amer: >60 mL/min (ref >60)
Glucose, Bld: 115 mg/dL — ABNORMAL HIGH (ref 70–99)
Potassium: 4.1 mmol/L (ref 3.5–5.1)
Sodium: 138 mmol/L (ref 135–145)
Total Bilirubin: 0.7 mg/dL (ref 0.3–1.2)
Total Protein: 6.7 g/dL (ref 6.5–8.1)

## 2020-04-25 LAB — HEMOGLOBIN A1C
Hgb A1c MFr Bld: 6.7 % — ABNORMAL HIGH (ref 4.8–5.6)
Mean Plasma Glucose: 145.59 mg/dL

## 2020-04-25 MED ORDER — IPRATROPIUM-ALBUTEROL 20-100 MCG/ACT IN AERS
1.0000 | INHALATION_SPRAY | Freq: Three times a day (TID) | RESPIRATORY_TRACT | Status: DC
  Administered 2020-04-26 – 2020-05-04 (×27): 1 via RESPIRATORY_TRACT
  Filled 2020-04-25: qty 4

## 2020-04-25 NOTE — Progress Notes (Signed)
PROGRESS NOTE    Tiffany Coleman  SNK:[id number] DOB: 11-26-1955 DOA: 04/05/2020 PCP: Nolene Ebbs, MD    Brief Narrative:  Tiffany Coleman is a 64 y.o. female with a history of morbid obesity, OSA not on CPAP, with recent Covid positive test who presented to the emergency room on 8/23 after being found passed out and in respiratory distress at home.  In the emergency room, noted to be feverish tachycardic and tachypneic and acute respiratory failure requiring 15 L nonrebreather.  CT scan of chest noted diffuse infiltrates consistent with Covid pneumonia although no evidence of PE.  Patient was admitted to the hospitalist service and started on Remdisivir, steroids and baricitinib.  Following hospitalization, patient continues to require high levels of oxygen up to as high as 15 L.  She received some Lasix and over the last 24 hours, her oxygenation requirements have come down to about 10 L.  Interestingly, she had an echocardiogram done on 9/11 which was unremarkable.    Patient had rough night sleeping.  Feels quite worn out today.  Continues to diurese and at this point is almost at 20 L.  No complaints of chest pain.  Doing okay on oxygen 10 L.  Assessment & Plan:   Active Problems:   Obstructive sleep apnea   Generalized anxiety disorder   Morbid obesity ([place])   Hypertension   GERD (gastroesophageal reflux disease)   Acute hypoxemic respiratory failure due to COVID-19 [facility])    Acute hypoxemic respiratory failure due to COVID-19 pneumonia: From a respiratory standpoint patient remains tenuous although some improvement from few days ago..  I suspect on admission, she received aggressive fluid resuscitation as since hospitalization, she is almost 19 L negative but likely received more than that when first coming in.  Interestingly, echocardiogram notes no evidence of underlying heart failure.  We will continue Lasix and oxygen support and continue Solu-Medrol.   Hopefully she will continue to improve.  Patient has completed course of Remdesivir and baricitinib.  Remains on Solu-Medrol. [facility] declined admission.  Cannot go to skilled nursing facility because she is on more than 6 L oxygen.  At rest, she is maintaining on 10 L of oxygen, does not tolerate mobility. Unable to use CPAP due to hospital policy, multiple attempts and requests to use CPAP at night not useful.  Mild intermittent asthma Stable.  No wheezing appreciated at this time.  Obesity: Patient meets criteria BMI greater than 30  Obstructive sleep apnea, not using CPAP at home Complicating oxygen requirement.  Patient has been noncompliant to using BiPAP event in the hospital.  She has untreated sleep apnea that is complicating her recovery and oxygen requirement.  Acute kidney injury Improved.  Normalized.  Abnormal LFT Likely secondary to COVID-19.  Monitor periodically.    Hyperglycemia Secondary to steroids.  No known history of diabetes.  A1c pending.  Last HbA1c is from 2020 when it was 5.5.   DVT prophylaxis: Lovenox subcu Code Status: full code  Family Communication: Updated daughter by phone Disposition Plan: Hopefully return home when improved  Status is: Inpatient  Remains inpatient appropriate because:IV treatments appropriate due to intensity of illness or inability to take PO and Inpatient level of care appropriate due to severity of illness   Dispo:  Patient From: Home  Planned Disposition: Home  Expected discharge date: 09/15  Medically stable for discharge: No     Consultants:   None  Procedures:   None  Antimicrobials:  Antibiotics Given (last 72  hours)    None         Objective: Vitals:   04/24/20 2005 04/25/20 0446 04/25/20 1108 04/25/20 1224  BP: 138/84 122/70  (!) 146/90  Pulse: (!) 108 95  (!) 101  Resp: _0 Temp: 98.6 F (37 C) 98.8 F (37.1 C)  98.3 F (36.8 C)  TempSrc: Oral Oral  Oral  SpO2:  100% 97% 90%    Weight:      Height:        Intake/Output Summary (Last 24 hours) at 04/25/2020 1331 Last data filed at 04/25/2020 1300 Gross per 24 hour  Intake 240 ml  Output 3100 ml  Net -2860 ml   Filed Weights   04/09/20 0550 04/12/20 1727 04/21/20 0756  Weight: 99.1 kg 95.6 kg 94.1 kg    Examination: General appearance: Alert and oriented x3, fatigued Resp: Clear to auscultation bilaterally Cardio: Regular rate and rhythm, S1-S2, borderline tachycardia GI: Soft, nontender, nondistended, positive bowel sounds Extremities: No clubbing or cyanosis or edema Psychiatry: Appropriate, no evidence of psychoses Neurologic: No focal neurological deficits.    Data Reviewed: I have personally reviewed following labs and imaging studies  CBC: Recent Labs  Lab 04/21/20 0451 04/24/20 0531 04/25/20 0621  WBC 11.1* 9.1 11.1*  NEUTROABS 9.5*  --   --   HGB 11.6* 11.3* 11.4*  HCT 37.1 35.9* 36.6  MCV 87.7 88.0 88.0  PLT 220 203 888   Basic Metabolic Panel: Recent Labs  Lab 04/21/20 0451 04/23/20 1306 04/24/20 0531 04/25/20 0621  NA 140 138 140 138  K 4.6 4.7 4.5 4.1  CL 99 96* 94* 92*  CO2 31 31 33* 33*  GLUCOSE 128* 151* 145* 115*  BUN 26* 22 29* 31*  CREATININE 0.88 0.88 0.81 0.81  CALCIUM 9.9 9.9 10.0 10.1  MG 2.1  --   --   --   PHOS 3.9  --   --   --    GFR: Estimated Creatinine Clearance: 74.5 mL/min (by C-G formula based on SCr of 0.81 mg/dL).  CBG: Recent Labs  Lab 04/24/20 1231 04/24/20 1707 04/24/20 1953 04/25/20 0810 04/25/20 1205  GLUCAP 173* 218* 216* 123* 146*      Radiology Studies: ECHOCARDIOGRAM COMPLETE  Result Date: 04/24/2020    ECHOCARDIOGRAM REPORT   Patient Name:   Elle DENISE Stickels Date of Exam: 04/24/2020 Medical Rec #:  [id number]                Height:       63.0 in Accession #:    [id number]               Weight:       207.5 lb Date of Birth:  March 20, 1956                BSA:          1.964 m Patient Age:    58 years                 BP:            110/74 mmHg Patient Gender: F                        HR:           88 bpm. Exam Location:  Inpatient Procedure: 2D Echo Indications:    428 CHF  History:        Patient has no  prior history of Echocardiogram examinations.                 Risk Factors:Current Smoker and Hypertension. Covid +.  Sonographer:    Jannett Celestine RDCS (AE) Referring Phys: [id number] Maryalice Pasley K [facility]  Sonographer Comments: Image acquisition challenging due to patient body habitus. IMPRESSIONS  1. Left ventricular ejection fraction, by estimation, is 60 to 65%. The left ventricle has normal function. The left ventricle has no regional wall motion abnormalities. There is moderate concentric left ventricular hypertrophy. Left ventricular diastolic parameters were normal.  2. Right ventricular systolic function is normal. The right ventricular size is normal. Tricuspid regurgitation signal is inadequate for assessing PA pressure.  3. The pericardial effusion is anterior to the right ventricle.  4. The mitral valve is normal in structure. No evidence of mitral valve regurgitation. No evidence of mitral stenosis.  5. The aortic valve is tricuspid. Aortic valve regurgitation is not visualized. Mild aortic valve sclerosis is present, with no evidence of aortic valve stenosis.  6. The inferior vena cava is normal in size with greater than 50% respiratory variability, suggesting right atrial pressure of 3 mmHg. FINDINGS  Left Ventricle: Left ventricular ejection fraction, by estimation, is 60 to 65%. The left ventricle has normal function. The left ventricle has no regional wall motion abnormalities. The left ventricular internal cavity size was normal in size. There is  moderate concentric left ventricular hypertrophy. Left ventricular diastolic parameters were normal. Normal left ventricular filling pressure. Right Ventricle: The right ventricular size is normal. No increase in right ventricular wall thickness. Right ventricular systolic  function is normal. Tricuspid regurgitation signal is inadequate for assessing PA pressure. Left Atrium: Left atrial size was normal in size. Right Atrium: Right atrial size was normal in size. Pericardium: Trivial pericardial effusion is present. The pericardial effusion is anterior to the right ventricle. Mitral Valve: The mitral valve is normal in structure. No evidence of mitral valve regurgitation. No evidence of mitral valve stenosis. Tricuspid Valve: The tricuspid valve is normal in structure. Tricuspid valve regurgitation is not demonstrated. No evidence of tricuspid stenosis. Aortic Valve: The aortic valve is tricuspid. Aortic valve regurgitation is not visualized. Mild aortic valve sclerosis is present, with no evidence of aortic valve stenosis. Pulmonic Valve: The pulmonic valve was normal in structure. Pulmonic valve regurgitation is trivial. No evidence of pulmonic stenosis. Aorta: The aortic root is normal in size and structure. Venous: The inferior vena cava is normal in size with greater than 50% respiratory variability, suggesting right atrial pressure of 3 mmHg. IAS/Shunts: No atrial level shunt detected by color flow Doppler.  LEFT VENTRICLE PLAX 2D LVIDd:         4.00 cm  Diastology LVIDs:         2.70 cm  LV e' lateral:   12.70 cm/s LV PW:         1.40 cm  LV E/e' lateral: 4.5 LV IVS:        1.40 cm LVOT diam:     2.10 cm LV SV:         52 LV SV Index:   26 LVOT Area:     3.46 cm  LEFT ATRIUM         Index LA diam:    3.30 cm 1.68 cm/m  AORTIC VALVE LVOT Vmax:   93.30 cm/s LVOT Vmean:  65.900 cm/s LVOT VTI:    0.150 m  AORTA Ao Root diam: 3.20 cm MITRAL VALVE MV Area (PHT):  2.69 cm    SHUNTS MV Decel Time: 282 msec    Systemic VTI:  0.15 m MV E velocity: 56.60 cm/s  Systemic Diam: 2.10 cm MV A velocity: 66.80 cm/s MV E/A ratio:  0.85 Fransico Him MD Electronically signed by Fransico Him MD Signature Date/Time: 04/24/2020/4:11:40 PM    Final      Scheduled Meds: . vitamin C  500 mg Oral  Daily  . dextromethorphan-guaiFENesin  1 tablet Oral BID  . enoxaparin (LOVENOX) injection  50 mg Subcutaneous Q12H  . feeding supplement ([employer] STANDARD 1.4)  325 mL Oral Daily  . furosemide  20 mg Intravenous BID  . Ipratropium-Albuterol  1 puff Inhalation Q6H  . mouth rinse  15 mL Mouth Rinse BID  . methylPREDNISolone (SOLU-MEDROL) injection  40 mg Intravenous Daily  . mometasone-formoterol  2 puff Inhalation BID  . multivitamin with minerals  1 tablet Oral Daily  . nystatin  5 mL Oral QID  . oxymetazoline  1 spray Each Nare BID  . pantoprazole  40 mg Oral Daily  . zinc sulfate  220 mg Oral Daily   Continuous Infusions:   LOS: 20 days     Annita Brod, MD [facility]

## 2020-04-25 NOTE — Progress Notes (Signed)
Attempted to wean patient from 10L with O2 sat of 96-97 to 9L, and O2 sat unable to remain above 88%. Patient's HFNC returned to 10L and patient's O2 saturation recovered to 94%

## 2020-04-26 DIAGNOSIS — F411 Generalized anxiety disorder: Secondary | ICD-10-CM

## 2020-04-26 DIAGNOSIS — K219 Gastro-esophageal reflux disease without esophagitis: Secondary | ICD-10-CM

## 2020-04-26 LAB — CBC
HCT: 36.9 % (ref 36.0–46.0)
Hemoglobin: 11.6 g/dL — ABNORMAL LOW (ref 12.0–15.0)
MCH: 27.8 pg (ref 26.0–34.0)
MCHC: 31.4 g/dL (ref 30.0–36.0)
MCV: 88.3 fL (ref 80.0–100.0)
Platelets: 160 10*3/uL (ref 150–400)
RBC: 4.18 MIL/uL (ref 3.87–5.11)
RDW: 15.8 % — ABNORMAL HIGH (ref 11.5–15.5)
WBC: 10 10*3/uL (ref 4.0–10.5)
nRBC: 0 % (ref 0.0–0.2)

## 2020-04-26 LAB — COMPREHENSIVE METABOLIC PANEL
ALT: 41 U/L (ref 0–44)
AST: 16 U/L (ref 15–41)
Albumin: 3.3 g/dL — ABNORMAL LOW (ref 3.5–5.0)
Alkaline Phosphatase: 54 U/L (ref 38–126)
Anion gap: 12 (ref 5–15)
BUN: 35 mg/dL — ABNORMAL HIGH (ref 8–23)
CO2: 33 mmol/L — ABNORMAL HIGH (ref 22–32)
Calcium: 10.1 mg/dL (ref 8.9–10.3)
Chloride: 91 mmol/L — ABNORMAL LOW (ref 98–111)
Creatinine, Ser: 0.91 mg/dL (ref 0.44–1.00)
GFR calc Af Amer: >60 mL/min (ref >60)
GFR calc non Af Amer: >60 mL/min (ref >60)
Glucose, Bld: 124 mg/dL — ABNORMAL HIGH (ref 70–99)
Potassium: 4.1 mmol/L (ref 3.5–5.1)
Sodium: 136 mmol/L (ref 135–145)
Total Bilirubin: 0.7 mg/dL (ref 0.3–1.2)
Total Protein: 6.5 g/dL (ref 6.5–8.1)

## 2020-04-26 LAB — GLUCOSE, CAPILLARY
Glucose-Capillary: 116 mg/dL — ABNORMAL HIGH (ref 70–99)
Glucose-Capillary: 159 mg/dL — ABNORMAL HIGH (ref 70–99)
Glucose-Capillary: 242 mg/dL — ABNORMAL HIGH (ref 70–99)
Glucose-Capillary: 125 mg/dL — ABNORMAL HIGH (ref 70–99)

## 2020-04-26 MED ORDER — LINAGLIPTIN 5 MG PO TABS
5.0000 mg | ORAL_TABLET | Freq: Every day | ORAL | Status: DC
  Administered 2020-04-26 – 2020-05-06 (×11): 5 mg via ORAL
  Filled 2020-04-26 (×11): qty 1

## 2020-04-26 NOTE — Progress Notes (Signed)
Pt is currently on 10L hfnc.  Pt refused nocturnal cpap, prefers to wear hfnc instead.

## 2020-04-26 NOTE — Progress Notes (Signed)
PROGRESS NOTE    Courtne Lighty  RXV:[id number] DOB: June 25, 1956 DOA: 04/05/2020 PCP: Nolene Ebbs, MD    Brief Narrative:  Bernardine Langworthy is a 64 y.o.femalewith a history of morbid obesity, OSA not on CPAP, with recent Covid positive test who presented to the emergency room on 8/23 after being found passed out and in respiratory distress at home.  In the emergency room, noted to be feverish tachycardic and tachypneic and acute respiratory failure requiring 15 L nonrebreather.  CT scan of chest noted diffuse infiltrates consistent with Covid pneumonia although no evidence of PE.  Patient was admitted to the hospitalist service and started on Remdisivir, steroids and baricitinib.    During the hospitalization, patient continues to require high levels of oxygen up to as high as 15 L.  She received some Lasix and over the last 24 hours, her oxygenation requirements have come down to about 10 L.  Interestingly, she had an echocardiogram done on 9/11 which was unremarkable.    Patient continues with significant respiratory distress/shortness of breath, continues to require 10 L high flow nasal cannula.  Continues to diurese with total net  Negative 20.2 L during the hospitalization.     Assessment & Plan:   Active Problems:   Obstructive sleep apnea   Generalized anxiety disorder   Morbid obesity ([place])   Hypertension   GERD (gastroesophageal reflux disease)   Acute hypoxemic respiratory failure due to COVID-19 [facility])   Acute hypoxic respiratory failure secondary to acute Covid-19 viral pneumonia during the ongoing 2020 Covid 19 Pandemic - POA Patient presented to the ED with progressive shortness of breath, tachypnea and was found to be tachycardic and requiring 15 L NRB to maintain oxygen saturation.  CT chest notable for diffuse infiltrates consistent with multifocal/Covid pneumonia, negative for PE. --COVID test: + 04/05/2020 --CRP 7.6>>>0.6 --ddimer  8.12>>2.79 --Completed 5-day course of remdesivir 04/09/2020 --Completed 14-day course of baricitinib 04/18/2020 --Continue Solumedrol 40 mg IV daily --prone for 2-3hrs every 12hrs if able --Continue supplemental oxygen, titrate to maintain SPO2 greater than 92%, currently on 10 L high flow nasal cannula --Continue supportive care with albuterol MDI prn, vitamin C, zinc, Tylenol, antitussives (benzonatate/ Mucinex/Tussionex) --Repeat CBC, CMP, D-dimer, and CRP in the am --Continue airborne/contact isolation precautions for 3 weeks from the day of diagnosis  The treatment plan and use of medications and known side effects were discussed with patient/family. Some of the medications used are based on case reports/anecdotal data.  All other medications being used in the management of COVID-19 based on limited study data.  Complete risks and long-term side effects are unknown, however in the best clinical judgment they seem to be of some benefit.  Patient wanted to proceed with treatment options provided.  Type 2 diabetes mellitus Hemoglobin A1c 6.7 on 04/25/2020.  No previous history of diabetes.  Steroids likely exacerbating hyperglycemic episodes. --Levemir 44 units [place] BID --NovoLog 12 units TIDAC --Tradjenta 5 mg p.o. daily --Insulin sliding scale for further coverage --CBGs qAC/HS  Mild intermittent asthma Stable.  No wheezing appreciated at this time.  Obesity: Patient meets criteria BMI greater than 30  Obstructive sleep apnea, not using CPAP at home Complicating oxygen requirement.  Patient has been noncompliant to using BiPAP event in the hospital.  She has untreated sleep apnea that is complicating her recovery and oxygen requirement.  Acute kidney injury Improved.  Normalized.  Lab Results  Component Value Date   CREATININE 0.91 04/26/2020   CREATININE 0.81 04/25/2020  CREATININE 0.81 04/24/2020     Abnormal LFT: Resolved Likely secondary to COVID-19.  Monitor  periodically.    Hepatic Function Latest Ref Rng & Units 04/26/2020 04/25/2020 04/24/2020  Total Protein 6.5 - 8.1 g/dL 6.5 6.7 6.2(L)  Albumin 3.5 - 5.0 g/dL 3.3(L) 3.3(L) 3.1(L)  AST 15 - 41 U/L _0 ALT 0 - 44 U/L 41 46(H) 39  Alk Phosphatase 38 - 126 U/L 54 58 54  Total Bilirubin 0.3 - 1.2 mg/dL 0.7 0.7 0.9      DVT prophylaxis: Lovenox Code Status: Full code Family Communication: Updated patient's daughter via telephone  Disposition Plan:  Status is: Inpatient  Remains inpatient appropriate because:Ongoing diagnostic testing needed not appropriate for outpatient work up, Unsafe d/c plan, IV treatments appropriate due to intensity of illness or inability to take PO and Inpatient level of care appropriate due to severity of illness   Dispo:  Patient From: Home  Planned Disposition: Home  Expected discharge date: 04/27/20  Medically stable for discharge: No    Consultants:   None  Procedures:   Transthoracic echocardiogram  Antimicrobials:   Remdesivir  Baricitinib   Subjective: Patient seen and examined bedside, lying in bed.  Continues with significant shortness of breath especially with any type of exertion.  Notable oxygen desaturation when titrated down to 9 L yesterday with SPO2 less than 88%.  Continues with overall weakness and fatigue.  Appetite improving.  No other complaints or concerns at this time.  Denies headache, no fever/chills/night sweats, no nausea/vomiting/diarrhea, no chest pain, palpitations, no abdominal pain.  No acute events overnight per nursing staff.  Objective: Vitals:   04/25/20 1108 04/25/20 1224 04/25/20 2221 04/26/20 0644  BP:  (!) 146/90 113/77 112/79  Pulse:  (!) 101 94 94  Resp:  17 20 (!) 22  Temp:  98.3 F (36.8 C) 98.8 F (37.1 C) 97.8 F (36.6 C)  TempSrc:  Oral Oral Oral  SpO2: 97% 90% 98% 90%  Weight:      Height:        Intake/Output Summary (Last 24 hours) at 04/26/2020 1135 Last data filed at 04/26/2020  0647 Gross per 24 hour  Intake --  Output 1760 ml  Net -1760 ml   Filed Weights   04/09/20 0550 04/12/20 1727 04/21/20 0756  Weight: 99.1 kg 95.6 kg 94.1 kg    Examination:  General exam: Appears calm and comfortable  Respiratory system: Clear to auscultation.  On 10 L high flow nasal cannula with SPO2 90% Cardiovascular system: S1 & S2 heard, RRR. No JVD, murmurs, rubs, gallops or clicks. No pedal edema. Gastrointestinal system: Abdomen is nondistended, soft and nontender. No organomegaly or masses felt. Normal bowel sounds heard. Central nervous system: Alert and oriented. No focal neurological deficits. Extremities: Symmetric 5 x 5 power. Skin: No rashes, lesions or ulcers Psychiatry: Judgement and insight appear normal. Mood & affect appropriate.     Data Reviewed: I have personally reviewed following labs and imaging studies  CBC: Recent Labs  Lab 04/21/20 0451 04/24/20 0531 04/25/20 0621 04/26/20 0437  WBC 11.1* 9.1 11.1* 10.0  NEUTROABS 9.5*  --   --   --   HGB 11.6* 11.3* 11.4* 11.6*  HCT 37.1 35.9* 36.6 36.9  MCV 87.7 88.0 88.0 88.3  PLT 220 203 177 774   Basic Metabolic Panel: Recent Labs  Lab 04/21/20 0451 04/23/20 1306 04/24/20 0531 04/25/20 0621 04/26/20 0437  NA 140 138 140 138 136  K 4.6 4.7  4.5 4.1 4.1  CL 99 96* 94* 92* 91*  CO2 31 31 33* 33* 33*  GLUCOSE 128* 151* 145* 115* 124*  BUN 26* 22 29* 31* 35*  CREATININE 0.88 0.88 0.81 0.81 0.91  CALCIUM 9.9 9.9 10.0 10.1 10.1  MG 2.1  --   --   --   --   PHOS 3.9  --   --   --   --    GFR: Estimated Creatinine Clearance: 66.3 mL/min (by C-G formula based on SCr of 0.91 mg/dL). Liver Function Tests: Recent Labs  Lab 04/24/20 0531 04/25/20 0621 04/26/20 0437  AST _0 ALT 39 46* 41  ALKPHOS 54 58 54  BILITOT 0.9 0.7 0.7  PROT 6.2* 6.7 6.5  ALBUMIN 3.1* 3.3* 3.3*   No results for input(s): LIPASE, AMYLASE in the last 168 hours. No results for input(s): AMMONIA in the last 168  hours. Coagulation Profile: No results for input(s): INR, PROTIME in the last 168 hours. Cardiac Enzymes: No results for input(s): CKTOTAL, CKMB, CKMBINDEX, TROPONINI in the last 168 hours. BNP (last 3 results) No results for input(s): PROBNP in the last 8760 hours. HbA1C: Recent Labs    04/25/20 0621  HGBA1C 6.7*   CBG: Recent Labs  Lab 04/25/20 1205 04/25/20 1559 04/25/20 2217 04/26/20 0815 04/26/20 1128  GLUCAP 146* 253* 135* 116* 159*   Lipid Profile: No results for input(s): CHOL, HDL, LDLCALC, TRIG, CHOLHDL, LDLDIRECT in the last 72 hours. Thyroid Function Tests: No results for input(s): TSH, T4TOTAL, FREET4, T3FREE, THYROIDAB in the last 72 hours. Anemia Panel: No results for input(s): VITAMINB12, FOLATE, FERRITIN, TIBC, IRON, RETICCTPCT in the last 72 hours. Sepsis Labs: No results for input(s): PROCALCITON, LATICACIDVEN in the last 168 hours.  No results found for this or any previous visit (from the past 240 hour(s)).       Radiology Studies: ECHOCARDIOGRAM COMPLETE  Result Date: 04/24/2020    ECHOCARDIOGRAM REPORT   Patient Name:   LETITA PRENTISS Lesage Date of Exam: 04/24/2020 Medical Rec #:  [id number]                Height:       63.0 in Accession #:    [id number]               Weight:       207.5 lb Date of Birth:  Jul 10, 1956                BSA:          1.964 m Patient Age:    52 years                 BP:           110/74 mmHg Patient Gender: F                        HR:           88 bpm. Exam Location:  Inpatient Procedure: 2D Echo Indications:    428 CHF  History:        Patient has no prior history of Echocardiogram examinations.                 Risk Factors:Current Smoker and Hypertension. Covid +.  Sonographer:    Jannett Celestine RDCS (AE) Referring Phys: [id number] SENDIL K [facility]  Sonographer Comments: Image acquisition challenging due to patient body habitus. IMPRESSIONS  1. Left ventricular ejection fraction,  by estimation, is 60 to 65%. The left ventricle  has normal function. The left ventricle has no regional wall motion abnormalities. There is moderate concentric left ventricular hypertrophy. Left ventricular diastolic parameters were normal.  2. Right ventricular systolic function is normal. The right ventricular size is normal. Tricuspid regurgitation signal is inadequate for assessing PA pressure.  3. The pericardial effusion is anterior to the right ventricle.  4. The mitral valve is normal in structure. No evidence of mitral valve regurgitation. No evidence of mitral stenosis.  5. The aortic valve is tricuspid. Aortic valve regurgitation is not visualized. Mild aortic valve sclerosis is present, with no evidence of aortic valve stenosis.  6. The inferior vena cava is normal in size with greater than 50% respiratory variability, suggesting right atrial pressure of 3 mmHg. FINDINGS  Left Ventricle: Left ventricular ejection fraction, by estimation, is 60 to 65%. The left ventricle has normal function. The left ventricle has no regional wall motion abnormalities. The left ventricular internal cavity size was normal in size. There is  moderate concentric left ventricular hypertrophy. Left ventricular diastolic parameters were normal. Normal left ventricular filling pressure. Right Ventricle: The right ventricular size is normal. No increase in right ventricular wall thickness. Right ventricular systolic function is normal. Tricuspid regurgitation signal is inadequate for assessing PA pressure. Left Atrium: Left atrial size was normal in size. Right Atrium: Right atrial size was normal in size. Pericardium: Trivial pericardial effusion is present. The pericardial effusion is anterior to the right ventricle. Mitral Valve: The mitral valve is normal in structure. No evidence of mitral valve regurgitation. No evidence of mitral valve stenosis. Tricuspid Valve: The tricuspid valve is normal in structure. Tricuspid valve regurgitation is not demonstrated. No evidence of  tricuspid stenosis. Aortic Valve: The aortic valve is tricuspid. Aortic valve regurgitation is not visualized. Mild aortic valve sclerosis is present, with no evidence of aortic valve stenosis. Pulmonic Valve: The pulmonic valve was normal in structure. Pulmonic valve regurgitation is trivial. No evidence of pulmonic stenosis. Aorta: The aortic root is normal in size and structure. Venous: The inferior vena cava is normal in size with greater than 50% respiratory variability, suggesting right atrial pressure of 3 mmHg. IAS/Shunts: No atrial level shunt detected by color flow Doppler.  LEFT VENTRICLE PLAX 2D LVIDd:         4.00 cm  Diastology LVIDs:         2.70 cm  LV e' lateral:   12.70 cm/s LV PW:         1.40 cm  LV E/e' lateral: 4.5 LV IVS:        1.40 cm LVOT diam:     2.10 cm LV SV:         52 LV SV Index:   26 LVOT Area:     3.46 cm  LEFT ATRIUM         Index LA diam:    3.30 cm 1.68 cm/m  AORTIC VALVE LVOT Vmax:   93.30 cm/s LVOT Vmean:  65.900 cm/s LVOT VTI:    0.150 m  AORTA Ao Root diam: 3.20 cm MITRAL VALVE MV Area (PHT): 2.69 cm    SHUNTS MV Decel Time: 282 msec    Systemic VTI:  0.15 m MV E velocity: 56.60 cm/s  Systemic Diam: 2.10 cm MV A velocity: 66.80 cm/s MV E/A ratio:  0.85 Fransico Him MD Electronically signed by Fransico Him MD Signature Date/Time: 04/24/2020/4:11:40 PM    Final  Scheduled Meds: . vitamin C  500 mg Oral Daily  . dextromethorphan-guaiFENesin  1 tablet Oral BID  . enoxaparin (LOVENOX) injection  50 mg Subcutaneous Q12H  . feeding supplement ([employer] STANDARD 1.4)  325 mL Oral Daily  . furosemide  20 mg Intravenous BID  . Ipratropium-Albuterol  1 puff Inhalation TID  . mouth rinse  15 mL Mouth Rinse BID  . methylPREDNISolone (SOLU-MEDROL) injection  40 mg Intravenous Daily  . mometasone-formoterol  2 puff Inhalation BID  . multivitamin with minerals  1 tablet Oral Daily  . nystatin  5 mL Oral QID  . oxymetazoline  1 spray Each Nare BID  .  pantoprazole  40 mg Oral Daily  . zinc sulfate  220 mg Oral Daily   Continuous Infusions:   LOS: 21 days    Time spent: 37 minutes spent on chart review, discussion with nursing staff, consultants, updating family and interview/physical exam; more than 50% of that time was spent in counseling and/or coordination of care.    Tokiko Diefenderfer J [place] DO [facility] Available via Epic secure chat 7am-7pm After these hours, please refer to coverage provider listed on [email or web address] 04/26/2020, 11:35 AM

## 2020-04-26 NOTE — Progress Notes (Signed)
Pt assisted OOB to [facility] to have a bowel movement and washed up. 02 sat fluctuated between 85%-90% on 10L HFNC. Post activity pt dropped down to 78% and recovered slowly to high 80s for 10 mins.

## 2020-04-27 LAB — COMPREHENSIVE METABOLIC PANEL
ALT: 36 U/L (ref 0–44)
AST: 16 U/L (ref 15–41)
Albumin: 3.1 g/dL — ABNORMAL LOW (ref 3.5–5.0)
Alkaline Phosphatase: 49 U/L (ref 38–126)
Anion gap: 11 (ref 5–15)
BUN: 30 mg/dL — ABNORMAL HIGH (ref 8–23)
CO2: 34 mmol/L — ABNORMAL HIGH (ref 22–32)
Calcium: 9.8 mg/dL (ref 8.9–10.3)
Chloride: 90 mmol/L — ABNORMAL LOW (ref 98–111)
Creatinine, Ser: 0.97 mg/dL (ref 0.44–1.00)
GFR calc Af Amer: >60 mL/min (ref >60)
GFR calc non Af Amer: >60 mL/min (ref >60)
Glucose, Bld: 125 mg/dL — ABNORMAL HIGH (ref 70–99)
Potassium: 4.1 mmol/L (ref 3.5–5.1)
Sodium: 135 mmol/L (ref 135–145)
Total Bilirubin: 0.6 mg/dL (ref 0.3–1.2)
Total Protein: 6.3 g/dL — ABNORMAL LOW (ref 6.5–8.1)

## 2020-04-27 LAB — GLUCOSE, CAPILLARY
Glucose-Capillary: 125 mg/dL — ABNORMAL HIGH (ref 70–99)
Glucose-Capillary: 156 mg/dL — ABNORMAL HIGH (ref 70–99)
Glucose-Capillary: 195 mg/dL — ABNORMAL HIGH (ref 70–99)
Glucose-Capillary: 208 mg/dL — ABNORMAL HIGH (ref 70–99)

## 2020-04-27 LAB — D-DIMER, QUANTITATIVE: D-Dimer, Quant: 0.99 ug/mL-FEU — ABNORMAL HIGH (ref 0.00–0.50)

## 2020-04-27 LAB — C-REACTIVE PROTEIN: CRP: 0.8 mg/dL (ref <1.0)

## 2020-04-27 LAB — CBC
HCT: 37 % (ref 36.0–46.0)
Hemoglobin: 11.4 g/dL — ABNORMAL LOW (ref 12.0–15.0)
MCH: 27.4 pg (ref 26.0–34.0)
MCHC: 30.8 g/dL (ref 30.0–36.0)
MCV: 88.9 fL (ref 80.0–100.0)
Platelets: 142 10*3/uL — ABNORMAL LOW (ref 150–400)
RBC: 4.16 MIL/uL (ref 3.87–5.11)
RDW: 15.7 % — ABNORMAL HIGH (ref 11.5–15.5)
WBC: 9.3 10*3/uL (ref 4.0–10.5)
nRBC: 0 % (ref 0.0–0.2)

## 2020-04-27 MED ORDER — ENOXAPARIN SODIUM 60 MG/0.6ML ~~LOC~~ SOLN
50.0000 mg | SUBCUTANEOUS | Status: DC
  Administered 2020-04-28 – 2020-05-01 (×4): 50 mg via SUBCUTANEOUS
  Filled 2020-04-27 (×4): qty 0.6

## 2020-04-27 NOTE — Progress Notes (Signed)
Pt has been sitting in the chair since this afternoon tolerated well with 02 sat between 88%-100% on 9L HFNC.

## 2020-04-27 NOTE — TOC Progression Note (Signed)
Transition of Care [facility]) - Progression Note    Patient Details  Name: Tiffany Coleman MRN: [id number] Date of Birth: [birth year]  [facility] CM/SW Contact  Purcell Mouton, RN Phone Number: 04/27/2020, 11:54 AM  Clinical Narrative:    Pt continues to be on high flow of O2/9LHFNC, sats 96-100%. Pt is being weaned. Not ready for discharge.    Expected Discharge Plan: Home/Self Care Barriers to Discharge: Continued Medical Work up  Expected Discharge Plan and Services Expected Discharge Plan: Home/Self Care   Discharge Planning Services: CM Consult   Living arrangements for the past 2 months: Apartment                                       Social Determinants of Health (SDOH) Interventions    Readmission Risk Interventions No flowsheet data found.

## 2020-04-27 NOTE — Progress Notes (Signed)
Nutrition Follow-up  RD working remotely.  DOCUMENTATION CODES:   Obesity unspecified  INTERVENTION:  - continue [employer] 1.4 po once/day.   NUTRITION DIAGNOSIS:   Increased nutrient needs related to acute illness, catabolic illness ([id number] PNA) as evidenced by estimated needs. -ongoing  GOAL:   Patient will meet greater than or equal to 90% of their needs -variably met  MONITOR:   PO intake, Supplement acceptance, Labs, Weight trends  ASSESSMENT:   64 y.o. female with medical history of morbid obesity, OSA not on CPAP, GAD, GERD, and recent COVID-19 diagnosis at PCP office. She presented to the ED 8/23 having been found passed out and in respiratory distress at home. In the ED, she was febrile, tachycardic, tachypneic 78% on 15L NRB with hypoxia. CT angio chest showed no PE but did show extensive bilateral parenchymal infiltrates consistent with COVID-19 PNA. Remdesivir, steroids, and baricitinib started.  She has been eating 25-75% of meals over the past 4 days. She was previously eating mainly 75-100%. Accepting [employer] [place] 1.4 po 100% of the time offered.   Patient was last weighed on 9/8 and weight on that date was consistent with weight on 8/30.   MD note from this AM states likely d/c date of 9/16.     Labs reviewed; CBGs: 125 and 156 mg/dl, Cl: 90 mmol/l, BUN: 30 mg/dl. Medications reviewed; 500 mg ascorbic acid/day, 20 mg IV lasix BID, 5 mg tradjenta/day, 40 mg solu-medrol/day, 1 tablet multivitamin with minerals/day, 5 ml mycostatin QID, 40 mg oral protonix/day, 220 mg zinc sulfate/day.    Diet Order:   Diet Order            Diet Carb Modified Fluid consistency: Thin; Room service appropriate? Yes  Diet effective now                 EDUCATION NEEDS:   No education needs have been identified at this time  Skin:  Skin Assessment: Reviewed RN Assessment  Last BM:  9/13  Height:   Ht Readings from Last 1 Encounters:  04/12/20 _0  (1.6 m)     Weight:   Wt Readings from Last 1 Encounters:  04/21/20 94.1 kg     Estimated Nutritional Needs:  Kcal:  1880-2075 kcal Protein:  100-110 grams Fluid:  >/= 2 L/day     Jarome Matin, MS, RD, LDN, CNSC Inpatient Clinical Dietitian RD pager # available in AMION  After hours/weekend pager # available in [facility]

## 2020-04-27 NOTE — Progress Notes (Signed)
Paged on call about patient coughing up blood with a clot awaiting new orders if any.

## 2020-04-27 NOTE — Progress Notes (Signed)
Pt does not want to wear cpap while here in the hospital, prefers hfnc instead.  Pt is currently on 8L hfnc.  Pt is aware that RT is available should she change her mind.

## 2020-04-27 NOTE — Progress Notes (Signed)
PT Cancellation Note  Patient Details Name: Tiffany Coleman MRN: [id number] DOB: [birth year]   Cancelled Treatment:    Reason Eval/Treat Not Completed: Other (comment) (Pt just sat up to eat lunch tray. Requests to do therapy after having a chance to eat. Will follow up at later date/time as schedule allows.)  [place], [place]  Office [phone] Pager [phone]  04/27/2020 12:30 PM

## 2020-04-27 NOTE — Progress Notes (Signed)
PROGRESS NOTE    Tiffany Coleman  ZOX:[id number] DOB: 1956/02/07 DOA: 04/05/2020 PCP: Tiffany Ebbs, MD    Brief Narrative:  Tiffany Coleman is a 64 y.o.femalewith a history of morbid obesity, OSA not on CPAP, with recent Covid positive test who presented to the emergency room on 8/23 after being found passed out and in respiratory distress at home.  In the emergency room, noted to be feverish tachycardic and tachypneic and acute respiratory failure requiring 15 L nonrebreather.  CT scan of chest noted diffuse infiltrates consistent with Covid pneumonia although no evidence of PE.  Patient was admitted to the hospitalist service and started on Remdisivir, steroids and baricitinib.    During the hospitalization, patient continues to require high levels of oxygen up to as high as 15 L.  She received some Lasix and over the last 24 hours, her oxygenation requirements have come down to about 10 L.  Interestingly, she had an echocardiogram done on 9/11 which was unremarkable.    Patient continues with significant respiratory distress/shortness of breath, continues to require 10 L high flow nasal cannula.  Continues to diurese with total net  Negative 20.2 L during the hospitalization.     Assessment & Plan:   Active Problems:   Obstructive sleep apnea   Generalized anxiety disorder   Morbid obesity ([place])   Hypertension   GERD (gastroesophageal reflux disease)   Acute hypoxemic respiratory failure due to COVID-19 [facility])   Acute hypoxic respiratory failure secondary to acute Covid-19 viral pneumonia during the ongoing 2020 Covid 19 Pandemic - POA Patient presented to the ED with progressive shortness of breath, tachypnea and was found to be tachycardic and requiring 15 L NRB to maintain oxygen saturation.  CT chest notable for diffuse infiltrates consistent with multifocal/Covid pneumonia, negative for PE. --COVID test: + 04/05/2020 --CRP 7.6>>>0.6>0.8 --ddimer  8.12>>2.79>0.91 --Completed 5-day course of remdesivir 04/09/2020 --Completed 14-day course of baricitinib 04/18/2020 --Continue Solumedrol 40 mg IV daily --prone for 2-3hrs every 12hrs if able --Continue supplemental oxygen, titrate to maintain SPO2 greater than 92%, currently on 9 L high flow nasal cannula; down from 10L yesterday --Continue supportive care with albuterol MDI prn, vitamin C, zinc, Tylenol, antitussives (benzonatate/ Mucinex/Tussionex) --Repeat CBC, CMP, D-dimer, and CRP in the am --Continue airborne/contact isolation; although greater than 21 days since initial diagnosis, she still continues with high FiO2 requirements.  The treatment plan and use of medications and known side effects were discussed with patient/family. Some of the medications used are based on case reports/anecdotal data.  All other medications being used in the management of COVID-19 based on limited study data.  Complete risks and long-term side effects are unknown, however in the best clinical judgment they seem to be of some benefit.  Patient wanted to proceed with treatment options provided.  Type 2 diabetes mellitus Hemoglobin A1c 6.7 on 04/25/2020.  No previous history of diabetes.  Steroids likely exacerbating hyperglycemic episodes. --Levemir 44 units [place] BID --NovoLog 12 units TIDAC --Tradjenta 5 mg p.o. daily --Insulin sliding scale for further coverage --CBGs qAC/HS  Mild intermittent asthma Stable.  No wheezing appreciated at this time.  Obesity: Patient meets criteria BMI greater than 30  Obstructive sleep apnea, not using CPAP at home Complicating oxygen requirement.  Patient has been noncompliant to using BiPAP event in the hospital.  She has untreated sleep apnea that is complicating her recovery and oxygen requirement.  Acute kidney injury: Resolved  Lab Results  Component Value Date   CREATININE 0.97  04/27/2020   CREATININE 0.91 04/26/2020   CREATININE 0.81 04/25/2020      Abnormal LFT: Resolved Likely secondary to COVID-19.  Monitor periodically.    Hepatic Function Latest Ref Rng & Units 04/27/2020 04/26/2020 04/25/2020  Total Protein 6.5 - 8.1 g/dL 6.3(L) 6.5 6.7  Albumin 3.5 - 5.0 g/dL 3.1(L) 3.3(L) 3.3(L)  AST 15 - 41 U/L _0 ALT 0 - 44 U/L 36 41 46(H)  Alk Phosphatase 38 - 126 U/L 49 54 58  Total Bilirubin 0.3 - 1.2 mg/dL 0.6 0.7 0.7    Weakness, deconditioning: --PT/OT evaluation  DVT prophylaxis: Lovenox Code Status: Full code Family Communication: Updated patient's daughter, Tiffany Coleman via telephone this morning  Disposition Plan:  Status is: Inpatient  Remains inpatient appropriate because:Ongoing diagnostic testing needed not appropriate for outpatient work up, Unsafe d/c plan, IV treatments appropriate due to intensity of illness or inability to take PO and Inpatient level of care appropriate due to severity of illness continues to have high FiO2 requirements   Dispo:  Patient From: Home  Planned Disposition: To be determined  Expected discharge date: 04/29/20  Medically stable for discharge: No    Consultants:   None  Procedures:   Transthoracic echocardiogram  Antimicrobials:   Remdesivir  Baricitinib   Subjective: Patient seen and examined bedside, lying in bed.  Continues with significant shortness of breath especially with any type of exertion.  Now titrated down to 9 L nasal cannula at rest from 10 L yesterday.  But continues with significant hypoxia with any type movement/exertion.  Continues with overall weakness and fatigue.  Appetite improving.  No other complaints or concerns at this time.  Denies headache, no fever/chills/night sweats, no nausea/vomiting/diarrhea, no chest pain, palpitations, no abdominal pain.  No acute events overnight per nursing staff.  Objective: Vitals:   04/26/20 2041 04/27/20 0606 04/27/20 0834 04/27/20 0921  BP: 119/77 111/72    Pulse: (!) 101 97    Resp: 20 (!) 22     Temp: 98.1 F (36.7 C) 98 F (36.7 C)    TempSrc: Oral Oral    SpO2: 100% 97% (!) 87% 96%  Weight:      Height:        Intake/Output Summary (Last 24 hours) at 04/27/2020 1026 Last data filed at 04/27/2020 0607 Gross per 24 hour  Intake --  Output 1175 ml  Net -1175 ml   Filed Weights   04/09/20 0550 04/12/20 1727 04/21/20 0756  Weight: 99.1 kg 95.6 kg 94.1 kg    Examination:  General exam: Appears calm and comfortable  Respiratory system: Clear to auscultation.  On 9 L high flow nasal cannula with SPO2 97% Cardiovascular system: S1 & S2 heard, RRR. No JVD, murmurs, rubs, gallops or clicks. No pedal edema. Gastrointestinal system: Abdomen is nondistended, soft and nontender. No organomegaly or masses felt. Normal bowel sounds heard. Central nervous system: Alert and oriented. No focal neurological deficits. Extremities: Symmetric 5 x 5 power. Skin: No rashes, lesions or ulcers Psychiatry: Judgement and insight appear normal. Mood & affect appropriate.     Data Reviewed: I have personally reviewed following labs and imaging studies  CBC: Recent Labs  Lab 04/21/20 0451 04/24/20 0531 04/25/20 0621 04/26/20 0437 04/27/20 0449  WBC 11.1* 9.1 11.1* 10.0 9.3  NEUTROABS 9.5*  --   --   --   --   HGB 11.6* 11.3* 11.4* 11.6* 11.4*  HCT 37.1 35.9* 36.6 36.9 37.0  MCV 87.7 88.0 88.0 88.3  88.9  PLT 220 203 177 160 784*   Basic Metabolic Panel: Recent Labs  Lab 04/21/20 0451 04/21/20 0451 04/23/20 1306 04/24/20 0531 04/25/20 0621 04/26/20 0437 04/27/20 0449  NA 140   < > 138 140 138 136 135  K 4.6   < > 4.7 4.5 4.1 4.1 4.1  CL 99   < > 96* 94* 92* 91* 90*  CO2 31   < > 31 33* 33* 33* 34*  GLUCOSE 128*   < > 151* 145* 115* 124* 125*  BUN 26*   < > 22 29* 31* 35* 30*  CREATININE 0.88   < > 0.88 0.81 0.81 0.91 0.97  CALCIUM 9.9   < > 9.9 10.0 10.1 10.1 9.8  MG 2.1  --   --   --   --   --   --   PHOS 3.9  --   --   --   --   --   --    < > = values in this  interval not displayed.   GFR: Estimated Creatinine Clearance: 62.2 mL/min (by C-G formula based on SCr of 0.97 mg/dL). Liver Function Tests: Recent Labs  Lab 04/24/20 0531 04/25/20 0621 04/26/20 0437 04/27/20 0449  AST _0 ALT 39 46* 41 36  ALKPHOS 54 58 54 49  BILITOT 0.9 0.7 0.7 0.6  PROT 6.2* 6.7 6.5 6.3*  ALBUMIN 3.1* 3.3* 3.3* 3.1*   No results for input(s): LIPASE, AMYLASE in the last 168 hours. No results for input(s): AMMONIA in the last 168 hours. Coagulation Profile: No results for input(s): INR, PROTIME in the last 168 hours. Cardiac Enzymes: No results for input(s): CKTOTAL, CKMB, CKMBINDEX, TROPONINI in the last 168 hours. BNP (last 3 results) No results for input(s): PROBNP in the last 8760 hours. HbA1C: Recent Labs    04/25/20 0621  HGBA1C 6.7*   CBG: Recent Labs  Lab 04/26/20 0815 04/26/20 1128 04/26/20 1655 04/26/20 2044 04/27/20 0809  GLUCAP 116* 159* 242* 125* 125*   Lipid Profile: No results for input(s): CHOL, HDL, LDLCALC, TRIG, CHOLHDL, LDLDIRECT in the last 72 hours. Thyroid Function Tests: No results for input(s): TSH, T4TOTAL, FREET4, T3FREE, THYROIDAB in the last 72 hours. Anemia Panel: No results for input(s): VITAMINB12, FOLATE, FERRITIN, TIBC, IRON, RETICCTPCT in the last 72 hours. Sepsis Labs: No results for input(s): PROCALCITON, LATICACIDVEN in the last 168 hours.  No results found for this or any previous visit (from the past 240 hour(s)).       Radiology Studies: No results found.      Scheduled Meds: . vitamin C  500 mg Oral Daily  . dextromethorphan-guaiFENesin  1 tablet Oral BID  . enoxaparin (LOVENOX) injection  50 mg Subcutaneous Q12H  . feeding supplement (KATE FARMS STANDARD 1.4)  325 mL Oral Daily  . furosemide  20 mg Intravenous BID  . Ipratropium-Albuterol  1 puff Inhalation TID  . linagliptin  5 mg Oral Daily  . mouth rinse  15 mL Mouth Rinse BID  . methylPREDNISolone (SOLU-MEDROL)  injection  40 mg Intravenous Daily  . mometasone-formoterol  2 puff Inhalation BID  . multivitamin with minerals  1 tablet Oral Daily  . nystatin  5 mL Oral QID  . oxymetazoline  1 spray Each Nare BID  . pantoprazole  40 mg Oral Daily  . zinc sulfate  220 mg Oral Daily   Continuous Infusions:   LOS: 22 days    Time spent: 36 minutes  spent on chart review, discussion with nursing staff, consultants, updating family and interview/physical exam; more than 50% of that time was spent in counseling and/or coordination of care.    Aoi Kouns J [place] DO [facility] Available via Epic secure chat 7am-7pm After these hours, please refer to coverage provider listed on [email or web address] 04/27/2020, 10:26 AM

## 2020-04-28 LAB — COMPREHENSIVE METABOLIC PANEL
ALT: 36 U/L (ref 0–44)
AST: 17 U/L (ref 15–41)
Albumin: 3.4 g/dL — ABNORMAL LOW (ref 3.5–5.0)
Alkaline Phosphatase: 58 U/L (ref 38–126)
Anion gap: 13 (ref 5–15)
BUN: 34 mg/dL — ABNORMAL HIGH (ref 8–23)
CO2: 31 mmol/L (ref 22–32)
Calcium: 10.1 mg/dL (ref 8.9–10.3)
Chloride: 92 mmol/L — ABNORMAL LOW (ref 98–111)
Creatinine, Ser: 0.83 mg/dL (ref 0.44–1.00)
GFR calc Af Amer: >60 mL/min (ref >60)
GFR calc non Af Amer: >60 mL/min (ref >60)
Glucose, Bld: 134 mg/dL — ABNORMAL HIGH (ref 70–99)
Potassium: 4.2 mmol/L (ref 3.5–5.1)
Sodium: 136 mmol/L (ref 135–145)
Total Bilirubin: 0.5 mg/dL (ref 0.3–1.2)
Total Protein: 6.6 g/dL (ref 6.5–8.1)

## 2020-04-28 LAB — C-REACTIVE PROTEIN: CRP: 1.3 mg/dL — ABNORMAL HIGH (ref <1.0)

## 2020-04-28 LAB — CBC
HCT: 40.4 % (ref 36.0–46.0)
Hemoglobin: 12.2 g/dL (ref 12.0–15.0)
MCH: 27.4 pg (ref 26.0–34.0)
MCHC: 30.2 g/dL (ref 30.0–36.0)
MCV: 90.6 fL (ref 80.0–100.0)
Platelets: 153 10*3/uL (ref 150–400)
RBC: 4.46 MIL/uL (ref 3.87–5.11)
RDW: 15.9 % — ABNORMAL HIGH (ref 11.5–15.5)
WBC: 11.9 10*3/uL — ABNORMAL HIGH (ref 4.0–10.5)
nRBC: 0 % (ref 0.0–0.2)

## 2020-04-28 LAB — GLUCOSE, CAPILLARY
Glucose-Capillary: 117 mg/dL — ABNORMAL HIGH (ref 70–99)
Glucose-Capillary: 156 mg/dL — ABNORMAL HIGH (ref 70–99)
Glucose-Capillary: 305 mg/dL — ABNORMAL HIGH (ref 70–99)
Glucose-Capillary: 205 mg/dL — ABNORMAL HIGH (ref 70–99)

## 2020-04-28 LAB — D-DIMER, QUANTITATIVE: D-Dimer, Quant: 0.96 ug/mL-FEU — ABNORMAL HIGH (ref 0.00–0.50)

## 2020-04-28 MED ORDER — OXYMETAZOLINE HCL 0.05 % NA SOLN: 1.0000 | Freq: Two times a day (BID) | NASAL | Status: DC

## 2020-04-28 MED ORDER — METHYLPREDNISOLONE SODIUM SUCC 40 MG IJ SOLR
20.0000 mg | Freq: Every day | INTRAMUSCULAR | Status: DC
  Administered 2020-04-29: 20 mg via INTRAVENOUS
  Filled 2020-04-28: qty 1

## 2020-04-28 NOTE — Progress Notes (Signed)
Pt does not want to wear cpap while admitted, prefers hfnc instead.  Pt is aware that RT is available should she change her mind.

## 2020-04-28 NOTE — Evaluation (Signed)
Physical Therapy Evaluation Patient Details Name: Tiffany Coleman MRN: [id number] DOB: 12/30/1955 Today's Date: 04/28/2020   History of Present Illness  64 y.o. female with a history of morbid obesity, OSA not on CPAP, GAD, GERD, and recent covid-19 diagnosis at PCP office who presented to the ED 8/23 having been found passed out and in respiratory distress at home.  Pt admitted for Acute hypoxemic respiratory failure due to COVID-19 pneumonia  Clinical Impression  Pt admitted with above diagnosis.  Pt currently with functional limitations due to the deficits listed below (see PT Problem List). Pt will benefit from skilled PT to increase their independence and safety with mobility to allow discharge to the venue listed below.  Pt reports she just bathed but agreeable to attempt ambulating.  Pt ambulated short distance around room and SPO2 dropped to 79% on 15L HFNC however able to improve to 93% within 3 minutes.  Pt plans to return home with assist from family if needed.     Follow Up Recommendations Home health PT;Supervision - Intermittent    Equipment Recommendations  Rolling walker with 5" wheels;3in1 (PT)    Recommendations for Other Services       Precautions / Restrictions Precautions Precautions: Fall Precaution Comments: currently on 15L HFNC      Mobility  Bed Mobility Overal bed mobility: Modified Independent             General bed mobility comments: increased time  Transfers Overall transfer level: Needs assistance Equipment used: Rolling walker (2 wheeled) Transfers: Sit to/from Stand Sit to Stand: Min guard         General transfer comment: verbal cues for hand placement  Ambulation/Gait Ambulation/Gait assistance: Min guard Gait Distance (Feet): 24 Feet Assistive device: Rolling walker (2 wheeled) Gait Pattern/deviations: Step-through pattern;Decreased stride length     General Gait Details: verbal cues for safe use of RW, SpO2 dropped  to 79% on 15L HFNC and took approx 3 minutes to recover to 93%  Stairs            Wheelchair Mobility    Modified Rankin (Stroke Patients Only)       Balance Overall balance assessment: Mild deficits observed, not formally tested                                           Pertinent Vitals/Pain Pain Assessment: 0-10 Pain Score: 5  Pain Location: R lower back Pain Descriptors / Indicators: Aching Pain Intervention(s): Repositioned;Monitored during session    Home Living Family/patient expects to be discharged to:: Private residence Living Arrangements: Other relatives Available Help at Discharge: Family Type of Home: House       Home Layout: Two level Home Equipment: None Additional Comments: plans to stay downstairs during day with bathroom, have family assist up to bedroom when needed. Reports typically perform sink bath.  works in Geologist, engineering at [employer].    Prior Function Level of Independence: Independent               Hand Dominance   Dominant Hand: Right    Extremity/Trunk Assessment        Lower Extremity Assessment Lower Extremity Assessment: Generalized weakness    Cervical / Trunk Assessment Cervical / Trunk Assessment: Normal  Communication   Communication: No difficulties  Cognition Arousal/Alertness: Awake/alert Behavior During Therapy: WFL for tasks assessed/performed Overall Cognitive Status: Within  Functional Limits for tasks assessed                                        General Comments      Exercises     Assessment/Plan    PT Assessment Patient needs continued PT services  PT Problem List Decreased mobility;Decreased activity tolerance;Cardiopulmonary status limiting activity;Decreased balance;Decreased knowledge of use of DME       PT Treatment Interventions Gait training;DME instruction;Therapeutic exercise;Functional mobility training;Therapeutic activities;Patient/family  education;Balance training    PT Goals (Current goals can be found in the Care Plan section)  Acute Rehab PT Goals PT Goal Formulation: With patient Time For Goal Achievement: 05/12/20 Potential to Achieve Goals: Good    Frequency Min 3X/week   Barriers to discharge        Co-evaluation               AM-PAC PT "6 Clicks" Mobility  Outcome Measure Help needed turning from your back to your side while in a flat bed without using bedrails?: A Little Help needed moving from lying on your back to sitting on the side of a flat bed without using bedrails?: A Little Help needed moving to and from a bed to a chair (including a wheelchair)?: A Little Help needed standing up from a chair using your arms (e.g., wheelchair or bedside chair)?: A Little Help needed to walk in hospital room?: A Little Help needed climbing 3-5 steps with a railing? : A Lot 6 Click Score: 17    End of Session Equipment Utilized During Treatment: Gait belt;Oxygen Activity Tolerance: Patient tolerated treatment well Patient left: with call bell/phone within reach;in bed;with nursing/sitter in room Nurse Communication: Mobility status PT Visit Diagnosis: Difficulty in walking, not elsewhere classified (R26.2)    Time: 1342-1400 PT Time Calculation (min) (ACUTE ONLY): 18 min   Charges:   PT Evaluation $PT Eval Low Complexity: 1 Low        Kati PT, DPT Acute Rehabilitation Services Pager: [phone] Office: [phone]  York Ram E 04/28/2020, 3:42 PM

## 2020-04-28 NOTE — Evaluation (Signed)
Occupational Therapy Evaluation Patient Details Name: Tiffany Coleman MRN: [id number] DOB: 19-Jun-1956 Today's Date: 04/28/2020    History of Present Illness 64 y.o. female with a history of morbid obesity, OSA not on CPAP, GAD, GERD, and recent covid-19 diagnosis at PCP office who presented to the ED 8/23 having been found passed out and in respiratory distress at home.  Pt admitted for Acute hypoxemic respiratory failure due to COVID-19 pneumonia   Clinical Impression   Tiffany Coleman is a 64 year old woman admitted to hospital with COVID who presents on 8 L HFNC with decreased activity tolerance, generalized weakness and poor cardiopulmonary endurance. Patient able to perform bed mobility with modified independence and standing with min guard. Patient able to donn socks seated at side of bed and perform UB ADLs with set up in seated position. Patient needing assistance for LB bathing and toileting. Patient's o2 sats dropped to 73% with standing at side of bed, HR up to 128 and patient reporting fatigue and shortness of breath.  Patient recovered to 88% at edge of bed without an increase in oxygen and recovered to 91% when returned to bed. Patient will benefit from skilled OT services while in hospital  to improve deficits, learn compensatory strategies and breathing techniques as needed in order to return home at discharge.    Follow Up Recommendations  Home health OT    Equipment Recommendations  Tub/shower seat    Recommendations for Other Services       Precautions / Restrictions Precautions Precautions: Fall Precaution Comments: monitor o2 sats Restrictions Weight Bearing Restrictions: No      Mobility Bed Mobility Overal bed mobility: Modified Independent             General bed mobility comments: Able to perform supine to sit, sit to supine, bridging and pulling herself up in bed - needing rest breaks due to shortness of breath and  fatigue.  Transfers Overall transfer level: Needs assistance Equipment used: None Transfers: Sit to/from Stand Sit to Stand: Min guard         General transfer comment: Min guard to stand at side of bed.    Balance Overall balance assessment: No apparent balance deficits (not formally assessed) (Able to tolerate anterior nudges with no loss of balance.)                                         ADL either performed or assessed with clinical judgement   ADL Overall ADL's : Needs assistance/impaired Eating/Feeding: Independent   Grooming: Set up;Sitting   Upper Body Bathing: Set up;Sitting   Lower Body Bathing: Maximal assistance;Set up;Sitting/lateral leans   Upper Body Dressing : Set up;Sitting   Lower Body Dressing: Set up;Sit to/from stand;Minimal assistance Lower Body Dressing Details (indicate cue type and reason): Able to donn socks seated at side of bed using figure 4 method. Assistance for pulling clothing up. Toilet Transfer: Min Associate Professor and Hygiene: Sit to/from stand;Maximal assistance Toileting - Clothing Manipulation Details (indicate cue type and reason): Reports max assist with toileting with nursing due to fatigue/shortness of breath.     Functional mobility during ADLs: Min guard       Vision Patient Visual Report: No change from baseline       Perception     Praxis      Pertinent Vitals/Pain Pain Assessment: No/denies  pain Pain Score: 5  Pain Location: R lower back Pain Descriptors / Indicators: Aching Pain Intervention(s): Repositioned;Monitored during session     Hand Dominance Right   Extremity/Trunk Assessment Upper Extremity Assessment Upper Extremity Assessment: Overall WFL for tasks assessed   Lower Extremity Assessment Lower Extremity Assessment: Defer to PT evaluation   Cervical / Trunk Assessment Cervical / Trunk Assessment: Normal   Communication  Communication Communication: No difficulties   Cognition Arousal/Alertness: Awake/alert Behavior During Therapy: WFL for tasks assessed/performed Overall Cognitive Status: Within Functional Limits for tasks assessed                                     General Comments       Exercises     Shoulder Instructions      Home Living Family/patient expects to be discharged to:: Private residence Living Arrangements: Other relatives Available Help at Discharge: Family Type of Home: House       Home Layout: Two level Alternate Level Stairs-Number of Steps: flight Alternate Level Stairs-Rails: None Bathroom Shower/Tub: Teacher, early years/pre: Standard     Home Equipment: None   Additional Comments: plans to stay downstairs during day with bathroom, have family assist up to bedroom when needed. Reports typically perform sink bath.  works in Geologist, engineering at [employer].      Prior Functioning/Environment Level of Independence: Independent                 OT Problem List: Decreased activity tolerance;Cardiopulmonary status limiting activity      OT Treatment/Interventions: Self-care/ADL training;Therapeutic exercise;Energy conservation;DME and/or AE instruction;Patient/family education;Therapeutic activities    OT Goals(Current goals can be found in the care plan section) Acute Rehab OT Goals Patient Stated Goal: To get stronger and improve independence OT Goal Formulation: With patient Time For Goal Achievement: 05/12/20 Potential to Achieve Goals: Good  OT Frequency: Min 2X/week   Barriers to D/C:            Co-evaluation              AM-PAC OT "6 Clicks" Daily Activity     Outcome Measure Help from another person eating meals?: None Help from another person taking care of personal grooming?: A Little Help from another person toileting, which includes using toliet, bedpan, or urinal?: A Lot Help from another person bathing  (including washing, rinsing, drying)?: A Little Help from another person to put on and taking off regular upper body clothing?: A Little Help from another person to put on and taking off regular lower body clothing?: A Little 6 Click Score: 18   End of Session Equipment Utilized During Treatment: Oxygen Nurse Communication:  (okay to see per Rn)  Activity Tolerance: Patient tolerated treatment well Patient left: in bed;with call bell/phone within reach;with bed alarm set  OT Visit Diagnosis: Muscle weakness (generalized) (M62.81)                Time: 0037-0488 OT Time Calculation (min): 20 min Charges:  OT General Charges $OT Visit: 1 Visit OT Evaluation $OT Eval Moderate Complexity: 1 Mod  Radonna Bracher, OTR/L [place]  Office [phone] Pager: [phone]   Lenward Chancellor 04/28/2020, 5:42 PM

## 2020-04-28 NOTE — Plan of Care (Signed)
  Problem: Coping: °Goal: Psychosocial and spiritual needs will be supported °Outcome: Progressing °  °Problem: Respiratory: °Goal: Will maintain a patent airway °Outcome: Progressing °Goal: Complications related to the disease process, condition or treatment will be avoided or minimized °Outcome: Progressing °  °Problem: Health Behavior/Discharge Planning: °Goal: Ability to manage health-related needs will improve °Outcome: Progressing °  °Problem: Clinical Measurements: °Goal: Ability to maintain clinical measurements within normal limits will improve °Outcome: Progressing °Goal: Will remain free from infection °Outcome: Progressing °Goal: Diagnostic test results will improve °Outcome: Progressing °Goal: Respiratory complications will improve °Outcome: Progressing °Goal: Cardiovascular complication will be avoided °Outcome: Progressing °  °Problem: Activity: °Goal: Risk for activity intolerance will decrease °Outcome: Progressing °  °Problem: Coping: °Goal: Level of anxiety will decrease °Outcome: Progressing °  °Problem: Elimination: °Goal: Will not experience complications related to bowel motility °Outcome: Progressing °Goal: Will not experience complications related to urinary retention °Outcome: Progressing °  °Problem: Pain Managment: °Goal: General experience of comfort will improve °Outcome: Progressing °  °Problem: Safety: °Goal: Ability to remain free from injury will improve °Outcome: Progressing °  °Problem: Skin Integrity: °Goal: Risk for impaired skin integrity will decrease °Outcome: Progressing °  °

## 2020-04-28 NOTE — Progress Notes (Signed)
PROGRESS NOTE    Tiffany Coleman  YJE:[id number] DOB: [birth year] DOA: 04/05/2020 PCP: Nolene Ebbs, MD    Brief Narrative:  Tiffany Coleman is a 64 y.o. female with a history of morbid obesity, OSA not on CPAP, GAD, GERD, and recent covid-19 diagnosis at PCP office who presented to the ED 8/23 having been found passed out and in respiratory distress at home. In the ED, she was febrile, tachycardic, tachypneic 78% on 15L NRB with hypoxia confirmed pO2 27 on VBG. CTA chest was performed showing no PE but did reveal extensive bilateral parenchymal infiltrates consistent with covid-19 pneumonia. Remdesivir, steroids, and baricitinib started and the patient was admitted to medicine service after consultation with PCCM. Patient has prolonged hospitalization because of continuous need for high flow oxygen. Some improvement with diuresis started on 9/11.  Assessment & Plan:   Active Problems:   Obstructive sleep apnea   Generalized anxiety disorder   Morbid obesity (HCC)   Hypertension   GERD (gastroesophageal reflux disease)   Acute hypoxemic respiratory failure due to COVID-19 [facility])  Acute hypoxemic respiratory failure due to COVID-19 pneumonia: Continue to monitor due to significant symptoms  chest physiotherapy, incentive spirometry, deep breathing exercises, sputum induction, mucolytic's and bronchodilators. Supplemental oxygen to keep saturations more than 85 %. Covid directed therapy with , steroids, patient treated with IV steroids, almost 3 weeks now, will taper off. remdesivir, completed 5 days of therapy Baricitinib, started 8/25-8/8, finished 2 weeks therapy. On albuterol inhaler.  Added steroid inhalers. On IV Lasix with good diuresis and some clinical response. Patient with persistent high oxygen requirement from initial Covid diagnosis, she is 22 days in the hospital.  Because of persistent symptoms, will consider continuing isolation  precautions.   COVID-19 Labs  Recent Labs    04/27/20 0449 04/28/20 0512  DDIMER 0.99* 0.96*  CRP 0.8 1.3*    Lab Results  Component Value Date   SARSCOV2NAA POSITIVE (A) 04/05/2020   SpO2: 97 % O2 Flow Rate (L/min): 8 L/min FiO2 (%): 94 % (decreased HFNC to 12 LPM)  Mild intermittent asthma: stable , complicating recovery from pneumonia.  Obstructive sleep apnea, not using CPAP at home.  Complicating oxygen requirement.  Untreated sleep apnea.  Acute kidney injury: Improved.  Normalized.  Abnormal LFT: Improving.  Normalized.  Hepatic Function Latest Ref Rng & Units 04/28/2020 04/27/2020 04/26/2020  Total Protein 6.5 - 8.1 g/dL 6.6 6.3(L) 6.5  Albumin 3.5 - 5.0 g/dL 3.4(L) 3.1(L) 3.3(L)  AST 15 - 41 U/L _0 ALT 0 - 44 U/L 36 36 41  Alk Phosphatase 38 - 126 U/L 58 49 54  Total Bilirubin 0.3 - 1.2 mg/dL 0.5 0.6 0.7     DVT prophylaxis: Lovenox subcu   Code Status: full code  Family Communication: daughter Ms. Ronnald Ramp on the phone  Disposition Plan: Status is: Inpatient  Remains inpatient appropriate because:Inpatient level of care appropriate due to severity of illness   Dispo: The patient is from: Home              Anticipated d/c is to: Unknown.              Anticipated d/c date is: Unknown.              Patient currently is not medically stable to d/c.  Unsafe discharge plan.  Still on significant oxygen.   Consultants:   None  Procedures:   None  Antimicrobials:  Antibiotics Given (last 72 hours)  None         Subjective: Patient seen and examined.  Overnight events noted.  She had nasal bleeding followed by cough with mucoid sputum mixed with blood.  Currently stopped. Remains on 8 L of oxygen which is better than before.  Mobilizing better.  Objective: Vitals:   04/27/20 1856 04/27/20 2109 04/28/20 0536 04/28/20 1000  BP:  124/69 130/88   Pulse:  (!) 103 99   Resp:  18 18   Temp:  98.2 F (36.8 C) 97.6 F (36.4 C)   TempSrc:   Oral Oral   SpO2: 96% 96% 99% 97%  Weight:      Height:        Intake/Output Summary (Last 24 hours) at 04/28/2020 1156 Last data filed at 04/27/2020 2121 Gross per 24 hour  Intake --  Output 1450 ml  Net -1450 ml   Filed Weights   04/09/20 0550 04/12/20 1727 04/21/20 0756  Weight: 99.1 kg 95.6 kg 94.1 kg    Examination: Physical Exam Constitutional:      Appearance: She is not toxic-appearing.     Comments: Patient looks comfortable.  Slightly anxious.  On 8 L oxygen at rest.  Can talk in full sentences.  HENT:     Mouth/Throat:     Mouth: Mucous membranes are moist.     Pharynx: Oropharynx is clear.  Cardiovascular:     Rate and Rhythm: Normal rate and regular rhythm.  Pulmonary:     Comments: Poor bilateral air entry.  No added sounds. Abdominal:     General: Bowel sounds are normal.     Palpations: Abdomen is soft.  Neurological:     General: No focal deficit present.     Mental Status: She is alert and oriented to person, place, and time.  Psychiatric:        Mood and Affect: Mood is not anxious.        Behavior: Behavior is not agitated.      Data Reviewed: I have personally reviewed following labs and imaging studies  CBC: Recent Labs  Lab 04/24/20 0531 04/25/20 0621 04/26/20 0437 04/27/20 0449 04/28/20 0512  WBC 9.1 11.1* 10.0 9.3 11.9*  HGB 11.3* 11.4* 11.6* 11.4* 12.2  HCT 35.9* 36.6 36.9 37.0 40.4  MCV 88.0 88.0 88.3 88.9 90.6  PLT 203 177 160 142* 818   Basic Metabolic Panel: Recent Labs  Lab 04/24/20 0531 04/25/20 0621 04/26/20 0437 04/27/20 0449 04/28/20 0512  NA 140 138 136 135 136  K 4.5 4.1 4.1 4.1 4.2  CL 94* 92* 91* 90* 92*  CO2 33* 33* 33* 34* 31  GLUCOSE 145* 115* 124* 125* 134*  BUN 29* 31* 35* 30* 34*  CREATININE 0.81 0.81 0.91 0.97 0.83  CALCIUM 10.0 10.1 10.1 9.8 10.1   GFR: Estimated Creatinine Clearance: 72.7 mL/min (by C-G formula based on SCr of 0.83 mg/dL). Liver Function Tests: Recent Labs  Lab  04/24/20 0531 04/25/20 0621 04/26/20 0437 04/27/20 0449 04/28/20 0512  AST _0 ALT 39 46* 41 36 36  ALKPHOS 54 58 54 49 58  BILITOT 0.9 0.7 0.7 0.6 0.5  PROT 6.2* 6.7 6.5 6.3* 6.6  ALBUMIN 3.1* 3.3* 3.3* 3.1* 3.4*   No results for input(s): LIPASE, AMYLASE in the last 168 hours. No results for input(s): AMMONIA in the last 168 hours. Coagulation Profile: No results for input(s): INR, PROTIME in the last 168 hours. Cardiac Enzymes: No results for input(s): CKTOTAL, CKMB,  CKMBINDEX, TROPONINI in the last 168 hours. BNP (last 3 results) No results for input(s): PROBNP in the last 8760 hours. HbA1C: No results for input(s): HGBA1C in the last 72 hours. CBG: Recent Labs  Lab 04/27/20 1208 04/27/20 1722 04/27/20 2106 04/28/20 0802 04/28/20 1132  GLUCAP 156* 195* 208* 117* 156*   Lipid Profile: No results for input(s): CHOL, HDL, LDLCALC, TRIG, CHOLHDL, LDLDIRECT in the last 72 hours. Thyroid Function Tests: No results for input(s): TSH, T4TOTAL, FREET4, T3FREE, THYROIDAB in the last 72 hours. Anemia Panel: No results for input(s): VITAMINB12, FOLATE, FERRITIN, TIBC, IRON, RETICCTPCT in the last 72 hours. Sepsis Labs: No results for input(s): PROCALCITON, LATICACIDVEN in the last 168 hours.  No results found for this or any previous visit (from the past 240 hour(s)).       Radiology Studies: No results found.      Scheduled Meds:  vitamin C  500 mg Oral Daily   dextromethorphan-guaiFENesin  1 tablet Oral BID   enoxaparin (LOVENOX) injection  50 mg Subcutaneous Q24H   feeding supplement (KATE FARMS STANDARD 1.4)  325 mL Oral Daily   furosemide  20 mg Intravenous BID   Ipratropium-Albuterol  1 puff Inhalation TID   linagliptin  5 mg Oral Daily   mouth rinse  15 mL Mouth Rinse BID   [START ON 04/29/2020] methylPREDNISolone (SOLU-MEDROL) injection  20 mg Intravenous Daily   mometasone-formoterol  2 puff Inhalation BID   multivitamin with  minerals  1 tablet Oral Daily   nystatin  5 mL Oral QID   oxymetazoline  1 spray Each Nare BID   pantoprazole  40 mg Oral Daily   zinc sulfate  220 mg Oral Daily   Continuous Infusions:   LOS: 23 days    Time spent: 30 minutes    Barb Merino, MD [facility] Pager [phone]

## 2020-04-29 LAB — GLUCOSE, CAPILLARY
Glucose-Capillary: 121 mg/dL — ABNORMAL HIGH (ref 70–99)
Glucose-Capillary: 236 mg/dL — ABNORMAL HIGH (ref 70–99)
Glucose-Capillary: 186 mg/dL — ABNORMAL HIGH (ref 70–99)
Glucose-Capillary: 216 mg/dL — ABNORMAL HIGH (ref 70–99)

## 2020-04-29 MED ORDER — PREDNISONE 10 MG PO TABS: 10.0000 mg | ORAL_TABLET | Freq: Every day | ORAL | Status: DC

## 2020-04-29 MED ORDER — PREDNISONE 50 MG PO TABS
50.0000 mg | ORAL_TABLET | Freq: Every day | ORAL | Status: AC
  Administered 2020-04-30 – 2020-05-01 (×2): 50 mg via ORAL
  Filled 2020-04-29 (×2): qty 1

## 2020-04-29 MED ORDER — PREDNISONE 5 MG PO TABS: 5.0000 mg | ORAL_TABLET | Freq: Every day | ORAL | Status: DC

## 2020-04-29 MED ORDER — PREDNISONE 20 MG PO TABS: 20.0000 mg | ORAL_TABLET | Freq: Every day | ORAL | Status: DC

## 2020-04-29 MED ORDER — PREDNISONE 20 MG PO TABS
40.0000 mg | ORAL_TABLET | Freq: Every day | ORAL | Status: AC
  Administered 2020-05-02 – 2020-05-03 (×2): 40 mg via ORAL
  Filled 2020-04-29: qty 2
  Filled 2020-04-29: qty 4

## 2020-04-29 MED ORDER — PREDNISONE 20 MG PO TABS
20.0000 mg | ORAL_TABLET | Freq: Every day | ORAL | Status: DC
  Administered 2020-05-06: 20 mg via ORAL
  Filled 2020-04-29: qty 1

## 2020-04-29 MED ORDER — PREDNISONE 20 MG PO TABS
30.0000 mg | ORAL_TABLET | Freq: Every day | ORAL | Status: AC
  Administered 2020-05-04 – 2020-05-05 (×2): 30 mg via ORAL
  Filled 2020-04-29 (×4): qty 1

## 2020-04-29 NOTE — Progress Notes (Signed)
Pt has declined use of CPAP during hospital stay.  RT to monitor and assess as needed.

## 2020-04-29 NOTE — Progress Notes (Signed)
PROGRESS NOTE    Tiffany Coleman  ION:[id number] DOB: [birth year] DOA: 04/05/2020 PCP: Nolene Ebbs, MD    Brief Narrative:  Tiffany Coleman is a 64 y.o. female with a history of morbid obesity, OSA not on CPAP, GAD, GERD, and recent covid-19 diagnosis at PCP office who presented to the ED 8/23 having been found passed out and in respiratory distress at home. In the ED, she was febrile, tachycardic, tachypneic 78% on 15L NRB with hypoxia confirmed pO2 27 on VBG. CTA chest was performed showing no PE but did reveal extensive bilateral parenchymal infiltrates consistent with covid-19 pneumonia. Remdesivir, steroids, and baricitinib started and the patient was admitted to medicine service after consultation with PCCM. Patient has prolonged hospitalization because of continuous need for high flow oxygen. Some improvement with diuresis started on 9/11.  Assessment & Plan:   Active Problems:   Obstructive sleep apnea   Generalized anxiety disorder   Morbid obesity (HCC)   Hypertension   GERD (gastroesophageal reflux disease)   Acute hypoxemic respiratory failure due to COVID-19 [facility])  Acute hypoxemic respiratory failure due to COVID-19 pneumonia: Continue to monitor due to significant symptoms  chest physiotherapy, incentive spirometry, deep breathing exercises, sputum induction, mucolytic's and bronchodilators. Supplemental oxygen to keep saturations more than 85 %. Covid directed therapy with , steroids, patient treated with IV steroids, almost 3 weeks now, will taper off.  Start prednisone taper for next 4 days. remdesivir, completed 5 days of therapy Baricitinib, started 8/25-8/8, finished 2 weeks therapy. On albuterol inhaler.  Added steroid inhalers. On IV Lasix with good diuresis and some clinical response. Patient with persistent high oxygen requirement from initial Covid diagnosis, she is 23 days in the hospital.  Because of persistent symptoms, will consider  continuing isolation precautions.   COVID-19 Labs  Recent Labs    04/27/20 0449 04/28/20 0512  DDIMER 0.99* 0.96*  CRP 0.8 1.3*    Lab Results  Component Value Date   SARSCOV2NAA POSITIVE (A) 04/05/2020   SpO2: (!) 89 % O2 Flow Rate (L/min): 2 L/min FiO2 (%): 94 % (decreased HFNC to 12 LPM)  Mild intermittent asthma: stable , complicating recovery from pneumonia.  Obstructive sleep apnea, not using CPAP at home.  Complicating oxygen requirement.  Untreated sleep apnea.  Acute kidney injury: Improved.  Normalized.  Abnormal LFT: Improving.  Normalized.  Hepatic Function Latest Ref Rng & Units 04/28/2020 04/27/2020 04/26/2020  Total Protein 6.5 - 8.1 g/dL 6.6 6.3(L) 6.5  Albumin 3.5 - 5.0 g/dL 3.4(L) 3.1(L) 3.3(L)  AST 15 - 41 U/L _0 ALT 0 - 44 U/L 36 36 41  Alk Phosphatase 38 - 126 U/L 58 49 54  Total Bilirubin 0.3 - 1.2 mg/dL 0.5 0.6 0.7     DVT prophylaxis: Lovenox subcu   Code Status: full code  Family Communication: daughter Ms. Ronnald Ramp on the phone  Disposition Plan: Status is: Inpatient  Remains inpatient appropriate because:Inpatient level of care appropriate due to severity of illness   Dispo: The patient is from: Home              Anticipated d/c is to: Unknown.              Anticipated d/c date is: Unknown.              Patient currently is not medically stable to d/c.  Unsafe discharge plan.  Still on significant oxygen. Continues to drop saturation and not able to maintain on 6 L  of oxygen on mobility.  Anticipate home when she can mobilize on 6 or less liters of oxygen.   Consultants:   None  Procedures:   None  Antimicrobials:  Antibiotics Given (last 72 hours)    None         Subjective: Seen and examined.  Overnight events.  At rest able to be comfortable on 6 L oxygen.  She dropped her saturations less than 80% on mobility yesterday with minimal activities.  Objective: Vitals:   04/28/20 1513 04/28/20 1807 04/28/20 2128  04/29/20 0559  BP: 126/81  120/72 131/85  Pulse: (!) 109  99 (!) 103  Resp: 20  20 15  Temp: 98.3 F (36.8 C)  97.9 F (36.6 C) 98.2 F (36.8 C)  TempSrc: Oral  Oral Oral  SpO2: 93% 91% 100% (!) 89%  Weight:      Height:        Intake/Output Summary (Last 24 hours) at 04/29/2020 1103 Last data filed at 04/29/2020 0810 Gross per 24 hour  Intake 240 ml  Output 2600 ml  Net -2360 ml   Filed Weights   04/09/20 0550 04/12/20 1727 04/21/20 0756  Weight: 99.1 kg 95.6 kg 94.1 kg    Examination:  Physical Exam Constitutional:      Comments: Chronically sick looking.  Currently on 6 L oxygen at rest.  Mildly anxious.  Not in any distress at rest.  Pulmonary:     Comments: Decreased bilateral air entry.  Some conducted airway sounds.  No other added sounds. Abdominal:     General: Bowel sounds are normal.     Palpations: Abdomen is soft.  Neurological:     General: No focal deficit present.     Mental Status: She is oriented to person, place, and time.     Comments: Generalized weakness.  No focal deficits.     Data Reviewed: I have personally reviewed following labs and imaging studies  CBC: Recent Labs  Lab 04/24/20 0531 04/25/20 0621 04/26/20 0437 04/27/20 0449 04/28/20 0512  WBC 9.1 11.1* 10.0 9.3 11.9*  HGB 11.3* 11.4* 11.6* 11.4* 12.2  HCT 35.9* 36.6 36.9 37.0 40.4  MCV 88.0 88.0 88.3 88.9 90.6  PLT 203 177 160 142* 153   Basic Metabolic Panel: Recent Labs  Lab 04/24/20 0531 04/25/20 0621 04/26/20 0437 04/27/20 0449 04/28/20 0512  NA 140 138 136 135 136  K 4.5 4.1 4.1 4.1 4.2  CL 94* 92* 91* 90* 92*  CO2 33* 33* 33* 34* 31  GLUCOSE 145* 115* 124* 125* 134*  BUN 29* 31* 35* 30* 34*  CREATININE 0.81 0.81 0.91 0.97 0.83  CALCIUM 10.0 10.1 10.1 9.8 10.1   GFR: Estimated Creatinine Clearance: 72.7 mL/min (by C-G formula based on SCr of 0.83 mg/dL). Liver Function Tests: Recent Labs  Lab 04/24/20 0531 04/25/20 0621 04/26/20 0437 04/27/20 0449  04/28/20 0512  AST 20 20 16 16 17  ALT 39 46* 41 36 36  ALKPHOS 54 58 54 49 58  BILITOT 0.9 0.7 0.7 0.6 0.5  PROT 6.2* 6.7 6.5 6.3* 6.6  ALBUMIN 3.1* 3.3* 3.3* 3.1* 3.4*   No results for input(s): LIPASE, AMYLASE in the last 168 hours. No results for input(s): AMMONIA in the last 168 hours. Coagulation Profile: No results for input(s): INR, PROTIME in the last 168 hours. Cardiac Enzymes: No results for input(s): CKTOTAL, CKMB, CKMBINDEX, TROPONINI in the last 168 hours. BNP (last 3 results) No results for input(s): PROBNP in the last 8760   hours. HbA1C: No results for input(s): HGBA1C in the last 72 hours. CBG: Recent Labs  Lab 04/28/20 0802 04/28/20 1132 04/28/20 1742 04/28/20 2121 04/29/20 0802  GLUCAP 117* 156* 305* 205* 121*   Lipid Profile: No results for input(s): CHOL, HDL, LDLCALC, TRIG, CHOLHDL, LDLDIRECT in the last 72 hours. Thyroid Function Tests: No results for input(s): TSH, T4TOTAL, FREET4, T3FREE, THYROIDAB in the last 72 hours. Anemia Panel: No results for input(s): VITAMINB12, FOLATE, FERRITIN, TIBC, IRON, RETICCTPCT in the last 72 hours. Sepsis Labs: No results for input(s): PROCALCITON, LATICACIDVEN in the last 168 hours.  No results found for this or any previous visit (from the past 240 hour(s)).       Radiology Studies: No results found.      Scheduled Meds: . vitamin C  500 mg Oral Daily  . dextromethorphan-guaiFENesin  1 tablet Oral BID  . enoxaparin (LOVENOX) injection  50 mg Subcutaneous Q24H  . feeding supplement (KATE FARMS STANDARD 1.4)  325 mL Oral Daily  . furosemide  20 mg Intravenous BID  . Ipratropium-Albuterol  1 puff Inhalation TID  . linagliptin  5 mg Oral Daily  . mouth rinse  15 mL Mouth Rinse BID  . mometasone-formoterol  2 puff Inhalation BID  . multivitamin with minerals  1 tablet Oral Daily  . nystatin  5 mL Oral QID  . oxymetazoline  1 spray Each Nare BID  . pantoprazole  40 mg Oral Daily  . [START ON  05/01/2020] predniSONE  10 mg Oral Q breakfast  . [START ON 05/02/2020] predniSONE  10 mg Oral Q breakfast  . [START ON 04/30/2020] predniSONE  20 mg Oral Q breakfast  . zinc sulfate  220 mg Oral Daily   Continuous Infusions:   LOS: 24 days    Time spent: 30 minutes    Barb Merino, MD [facility] Pager [phone]

## 2020-04-29 NOTE — Progress Notes (Signed)
Physical Therapy Treatment Patient Details Name: Tiffany Coleman MRN: [id number] DOB: [birth year] Today's Date: 04/29/2020    History of Present Illness 64 y.o. female with a history of morbid obesity, OSA not on CPAP, GAD, GERD, and recent covid-19 diagnosis at PCP office who presented to the ED 8/23 having been found passed out and in respiratory distress at home.  Pt admitted for Acute hypoxemic respiratory failure due to COVID-19 pneumonia    PT Comments    Pt eager to ambulate to bathroom.  Pt on 7L HFNC on arrival however dropped to 49% on 8L after using bathroom.  Pt able to recover to 93% on 9L within a few minutes.   Follow Up Recommendations  Home health PT;Supervision - Intermittent     Equipment Recommendations  Rolling walker with 5" wheels;3in1 (PT)    Recommendations for Other Services       Precautions / Restrictions Precautions Precautions: Fall Precaution Comments: monitor O2 sats, currently on 7-9L HFNC    Mobility  Bed Mobility Overal bed mobility: Modified Independent                Transfers Overall transfer level: Needs assistance Equipment used: Rolling walker (2 wheeled) Transfers: Sit to/from Stand Sit to Stand: Min guard         General transfer comment: cues for hand placement, SPO2 93% on 7L HFNC sitting EOB (pt placed on 8L for tank to bathroom)  Ambulation/Gait Ambulation/Gait assistance: Min guard Gait Distance (Feet): 20 Feet (total) Assistive device: Rolling walker (2 wheeled) Gait Pattern/deviations: Step-through pattern;Decreased stride length     General Gait Details: verbal cues for safe use of RW, SpO2 dropped to dropping to 80% upon sitting on toilet with 8L however increased to 90%, Spo2 dropped again to 49 after pt performed hygiene and ambulated back to recliner on 8L HFNC so increased to 9L and took approx 3 minutes to recover to 93%; pt left on 9L in room with nurse tech aware (RN at lunch)   Pt was  encouraged to take breathing breaks after activities as saturations dropped and improve to upper 80s prior to continuing.   Stairs             Wheelchair Mobility    Modified Rankin (Stroke Patients Only)       Balance                                            Cognition Arousal/Alertness: Awake/alert Behavior During Therapy: WFL for tasks assessed/performed Overall Cognitive Status: Within Functional Limits for tasks assessed                                        Exercises      General Comments        Pertinent Vitals/Pain Pain Assessment: No/denies pain    Home Living                      Prior Function            PT Goals (current goals can now be found in the care plan section) Progress towards PT goals: Progressing toward goals    Frequency    Min 3X/week      PT Plan Current plan remains appropriate  Co-evaluation              AM-PAC PT "6 Clicks" Mobility   Outcome Measure  Help needed turning from your back to your side while in a flat bed without using bedrails?: A Little Help needed moving from lying on your back to sitting on the side of a flat bed without using bedrails?: A Little Help needed moving to and from a bed to a chair (including a wheelchair)?: A Little Help needed standing up from a chair using your arms (e.g., wheelchair or bedside chair)?: A Little Help needed to walk in hospital room?: A Little Help needed climbing 3-5 steps with a railing? : A Lot 6 Click Score: 17    End of Session Equipment Utilized During Treatment: Oxygen Activity Tolerance: Patient tolerated treatment well Patient left: with call bell/phone within reach;in chair Nurse Communication: Mobility status PT Visit Diagnosis: Difficulty in walking, not elsewhere classified (R26.2)     Time: 8159-4707 PT Time Calculation (min) (ACUTE ONLY): 25 min  Charges:  $Gait Training: 8-22  mins $Therapeutic Activity: 8-22 mins                    Jannette Spanner PT, DPT Acute Rehabilitation Services Pager: [phone] Office: [phone]   York Ram E 04/29/2020, 4:14 PM

## 2020-04-29 NOTE — Plan of Care (Signed)
  Problem: Coping: °Goal: Psychosocial and spiritual needs will be supported °Outcome: Progressing °  °Problem: Respiratory: °Goal: Will maintain a patent airway °Outcome: Progressing °Goal: Complications related to the disease process, condition or treatment will be avoided or minimized °Outcome: Progressing °  °Problem: Health Behavior/Discharge Planning: °Goal: Ability to manage health-related needs will improve °Outcome: Progressing °  °Problem: Clinical Measurements: °Goal: Ability to maintain clinical measurements within normal limits will improve °Outcome: Progressing °Goal: Will remain free from infection °Outcome: Progressing °Goal: Diagnostic test results will improve °Outcome: Progressing °Goal: Respiratory complications will improve °Outcome: Progressing °Goal: Cardiovascular complication will be avoided °Outcome: Progressing °  °Problem: Activity: °Goal: Risk for activity intolerance will decrease °Outcome: Progressing °  °Problem: Coping: °Goal: Level of anxiety will decrease °Outcome: Progressing °  °Problem: Elimination: °Goal: Will not experience complications related to bowel motility °Outcome: Progressing °Goal: Will not experience complications related to urinary retention °Outcome: Progressing °  °Problem: Pain Managment: °Goal: General experience of comfort will improve °Outcome: Progressing °  °Problem: Safety: °Goal: Ability to remain free from injury will improve °Outcome: Progressing °  °Problem: Skin Integrity: °Goal: Risk for impaired skin integrity will decrease °Outcome: Progressing °  °

## 2020-04-30 LAB — GLUCOSE, CAPILLARY
Glucose-Capillary: 106 mg/dL — ABNORMAL HIGH (ref 70–99)
Glucose-Capillary: 157 mg/dL — ABNORMAL HIGH (ref 70–99)
Glucose-Capillary: 190 mg/dL — ABNORMAL HIGH (ref 70–99)
Glucose-Capillary: 187 mg/dL — ABNORMAL HIGH (ref 70–99)

## 2020-04-30 NOTE — Plan of Care (Signed)
Problem: Coping: Goal: Psychosocial and spiritual needs will be supported Outcome: Progressing   Problem: Respiratory: Goal: Will maintain a patent airway Outcome: Progressing Goal: Complications related to the disease process, condition or treatment will be avoided or minimized Outcome: Progressing   Problem: Health Behavior/Discharge Planning: Goal: Ability to manage health-related needs will improve Outcome: Progressing   Problem: Clinical Measurements: Goal: Ability to maintain clinical measurements within normal limits will improve Outcome: Progressing Goal: Will remain free from infection Outcome: Progressing Goal: Diagnostic test results will improve Outcome: Progressing Goal: Respiratory complications will improve Outcome: Progressing Goal: Cardiovascular complication will be avoided Outcome: Progressing   Problem: Activity: Goal: Risk for activity intolerance will decrease Outcome: Progressing   Problem: Coping: Goal: Level of anxiety will decrease Outcome: Progressing   Problem: Elimination: Goal: Will not experience complications related to bowel motility Outcome: Progressing Goal: Will not experience complications related to urinary retention Outcome: Progressing   Problem: Pain Managment: Goal: General experience of comfort will improve Outcome: Progressing   Problem: Safety: Goal: Ability to remain free from injury will improve Outcome: Progressing   Problem: Skin Integrity: Goal: Risk for impaired skin integrity will decrease Outcome: Progressing

## 2020-04-30 NOTE — Progress Notes (Signed)
PROGRESS NOTE    Tiffany Coleman  FMB:[id number] DOB: 1955-11-26 DOA: 04/05/2020 PCP: Nolene Ebbs, MD    Brief Narrative:  Tiffany Coleman is a 64 y.o. female with a history of morbid obesity, OSA not on CPAP, GAD, GERD, and recent covid-19 diagnosis at PCP office who presented to the ED 8/23 having been found passed out and in respiratory distress at home. In the ED, she was febrile, tachycardic, tachypneic 78% on 15L NRB with hypoxia confirmed pO2 27 on VBG. CTA chest was performed showing no PE but did reveal extensive bilateral parenchymal infiltrates consistent with covid-19 pneumonia. Remdesivir, steroids, and baricitinib started and the patient was admitted to medicine service after consultation with PCCM. Patient has prolonged hospitalization because of continuous need for high flow oxygen. Some improvement with diuresis started on 9/11.  Assessment & Plan:   Active Problems:   Obstructive sleep apnea   Generalized anxiety disorder   Morbid obesity (HCC)   Hypertension   GERD (gastroesophageal reflux disease)   Acute hypoxemic respiratory failure due to COVID-19 [facility])  Acute hypoxemic respiratory failure due to COVID-19 pneumonia: Continue to monitor due to significant symptoms  chest physiotherapy, incentive spirometry, deep breathing exercises, sputum induction, mucolytic's and bronchodilators. Supplemental oxygen to keep saturations more than 85 %. Covid directed therapy with , steroids, patient treated with IV steroids, almost 3 weeks now, will taper off.  Start prednisone taper for next 4 days. remdesivir, completed 5 days of therapy Baricitinib, started 8/25-8/8, finished 2 weeks therapy. On albuterol inhaler.  Added steroid inhalers. On IV Lasix with good diuresis and some clinical response. Patient with persistent high oxygen requirement from initial Covid diagnosis, she is 23 days in the hospital.  Because of persistent symptoms, will consider  continuing isolation precautions.   COVID-19 Labs  Recent Labs    04/28/20 0512  DDIMER 0.96*  CRP 1.3*    Lab Results  Component Value Date   SARSCOV2NAA POSITIVE (A) 04/05/2020   SpO2: 98 % O2 Flow Rate (L/min): 6 L/min FiO2 (%): 94 % (decreased HFNC to 12 LPM)  Mild intermittent asthma: stable , complicating recovery from pneumonia.  Obstructive sleep apnea, not using CPAP at home.  Complicating oxygen requirement.  Untreated sleep apnea.  Acute kidney injury: Improved.  Normalized.  Abnormal LFT: Improving.  Normalized.  Hepatic Function Latest Ref Rng & Units 04/28/2020 04/27/2020 04/26/2020  Total Protein 6.5 - 8.1 g/dL 6.6 6.3(L) 6.5  Albumin 3.5 - 5.0 g/dL 3.4(L) 3.1(L) 3.3(L)  AST 15 - 41 U/L _0 ALT 0 - 44 U/L 36 36 41  Alk Phosphatase 38 - 126 U/L 58 49 54  Total Bilirubin 0.3 - 1.2 mg/dL 0.5 0.6 0.7     DVT prophylaxis: Lovenox subcu   Code Status: full code  Family Communication: daughter Ms. Ronnald Ramp on the phone 9/16. Disposition Plan: Status is: Inpatient  Remains inpatient appropriate because:Inpatient level of care appropriate due to severity of illness   Dispo: The patient is from: Home              Anticipated d/c is to: Unknown.              Anticipated d/c date is: Unknown.              Patient currently is not medically stable to d/c.  Unsafe discharge plan.  Still on significant oxygen. Continues to drop saturation and not able to maintain on 6 L of oxygen on mobility.  Anticipate home when she can mobilize on 6 or less liters of oxygen.   Consultants:   None  Procedures:   None  Antimicrobials:  Antibiotics Given (last 72 hours)    None         Subjective: Seen and examined.  No overnight events.  Feels congested.  Feels good at rest.  Desatted to less than 50% on mobility yesterday.  Objective: Vitals:   04/29/20 1346 04/29/20 1827 04/29/20 2044 04/30/20 0605  BP:   118/73 113/68  Pulse:   (!) 104 90  Resp: _0 Temp:   98.6 F (37 C) 97.8 F (36.6 C)  TempSrc:   Oral Oral  SpO2:  94% 92% 98%  Weight:      Height:        Intake/Output Summary (Last 24 hours) at 04/30/2020 1114 Last data filed at 04/30/2020 1030 Gross per 24 hour  Intake 120 ml  Output 2320 ml  Net -2200 ml   Filed Weights   04/09/20 0550 04/12/20 1727 04/21/20 0756  Weight: 99.1 kg 95.6 kg 94.1 kg    Examination:  Physical Exam Constitutional:      Comments: Chronically sick looking.  Currently on 6 L oxygen at rest.  Mildly anxious.  Not in any distress at rest.  Pulmonary:     Comments: Decreased bilateral air entry.  Some conducted airway sounds.  No other added sounds. Abdominal:     General: Bowel sounds are normal.     Palpations: Abdomen is soft.  Neurological:     General: No focal deficit present.     Mental Status: She is oriented to person, place, and time.     Comments: Generalized weakness.  No focal deficits.     Data Reviewed: I have personally reviewed following labs and imaging studies  CBC: Recent Labs  Lab 04/24/20 0531 04/25/20 0621 04/26/20 0437 04/27/20 0449 04/28/20 0512  WBC 9.1 11.1* 10.0 9.3 11.9*  HGB 11.3* 11.4* 11.6* 11.4* 12.2  HCT 35.9* 36.6 36.9 37.0 40.4  MCV 88.0 88.0 88.3 88.9 90.6  PLT 203 177 160 142* 378   Basic Metabolic Panel: Recent Labs  Lab 04/24/20 0531 04/25/20 0621 04/26/20 0437 04/27/20 0449 04/28/20 0512  NA 140 138 136 135 136  K 4.5 4.1 4.1 4.1 4.2  CL 94* 92* 91* 90* 92*  CO2 33* 33* 33* 34* 31  GLUCOSE 145* 115* 124* 125* 134*  BUN 29* 31* 35* 30* 34*  CREATININE 0.81 0.81 0.91 0.97 0.83  CALCIUM 10.0 10.1 10.1 9.8 10.1   GFR: Estimated Creatinine Clearance: 72.7 mL/min (by C-G formula based on SCr of 0.83 mg/dL). Liver Function Tests: Recent Labs  Lab 04/24/20 0531 04/25/20 0621 04/26/20 0437 04/27/20 0449 04/28/20 0512  AST _1 ALT 39 46* 41 36 36  ALKPHOS 54 58 54 49 58  BILITOT 0.9 0.7 0.7 0.6 0.5    PROT 6.2* 6.7 6.5 6.3* 6.6  ALBUMIN 3.1* 3.3* 3.3* 3.1* 3.4*   No results for input(s): LIPASE, AMYLASE in the last 168 hours. No results for input(s): AMMONIA in the last 168 hours. Coagulation Profile: No results for input(s): INR, PROTIME in the last 168 hours. Cardiac Enzymes: No results for input(s): CKTOTAL, CKMB, CKMBINDEX, TROPONINI in the last 168 hours. BNP (last 3 results) No results for input(s): PROBNP in the last 8760 hours. HbA1C: No results for input(s): HGBA1C in the last 72 hours. CBG: Recent Labs  Lab 04/29/20 0802 04/29/20 1307 04/29/20 1739 04/29/20 2041 04/30/20 0830  GLUCAP 121* 236* 186* 216* 106*   Lipid Profile: No results for input(s): CHOL, HDL, LDLCALC, TRIG, CHOLHDL, LDLDIRECT in the last 72 hours. Thyroid Function Tests: No results for input(s): TSH, T4TOTAL, FREET4, T3FREE, THYROIDAB in the last 72 hours. Anemia Panel: No results for input(s): VITAMINB12, FOLATE, FERRITIN, TIBC, IRON, RETICCTPCT in the last 72 hours. Sepsis Labs: No results for input(s): PROCALCITON, LATICACIDVEN in the last 168 hours.  No results found for this or any previous visit (from the past 240 hour(s)).       Radiology Studies: No results found.      Scheduled Meds: . vitamin C  500 mg Oral Daily  . dextromethorphan-guaiFENesin  1 tablet Oral BID  . enoxaparin (LOVENOX) injection  50 mg Subcutaneous Q24H  . feeding supplement (KATE FARMS STANDARD 1.4)  325 mL Oral Daily  . furosemide  20 mg Intravenous BID  . Ipratropium-Albuterol  1 puff Inhalation TID  . linagliptin  5 mg Oral Daily  . mouth rinse  15 mL Mouth Rinse BID  . mometasone-formoterol  2 puff Inhalation BID  . multivitamin with minerals  1 tablet Oral Daily  . nystatin  5 mL Oral QID  . oxymetazoline  1 spray Each Nare BID  . pantoprazole  40 mg Oral Daily  . predniSONE  50 mg Oral Q breakfast   Followed by  . [START ON 05/02/2020] predniSONE  40 mg Oral Q breakfast   Followed by  .  [START ON 05/04/2020] predniSONE  30 mg Oral Q breakfast   Followed by  . [START ON 05/06/2020] predniSONE  20 mg Oral Q breakfast   Followed by  . [START ON 05/08/2020] predniSONE  10 mg Oral Q breakfast   Followed by  . [START ON 05/10/2020] predniSONE  5 mg Oral Q breakfast  . zinc sulfate  220 mg Oral Daily   Continuous Infusions:   LOS: 25 days    Time spent: 30 minutes    Barb Merino, MD [facility] Pager [phone]

## 2020-04-30 NOTE — TOC Progression Note (Signed)
Transition of Care [facility]) - Progression Note    Patient Details  Name: Tiffany Coleman MRN: [id number] Date of Birth: April 11, 1956  Transition of Care [facility]) CM/SW Contact  Purcell Mouton, RN Phone Number: 04/30/2020, 1:13 PM  Clinical Narrative:    Pt is improving on O2. At discharge pt will need O2 from [facility].  HHPT from [facility]. Will need HHHPT orders and Home O2 orders.    Expected Discharge Plan: Home/Self Care Barriers to Discharge: Continued Medical Work up  Expected Discharge Plan and Services Expected Discharge Plan: Home/Self Care   Discharge Planning Services: CM Consult   Living arrangements for the past 2 months: Apartment                                       Social Determinants of Health (SDOH) Interventions    Readmission Risk Interventions No flowsheet data found.

## 2020-05-01 LAB — GLUCOSE, CAPILLARY
Glucose-Capillary: 110 mg/dL — ABNORMAL HIGH (ref 70–99)
Glucose-Capillary: 186 mg/dL — ABNORMAL HIGH (ref 70–99)
Glucose-Capillary: 192 mg/dL — ABNORMAL HIGH (ref 70–99)
Glucose-Capillary: 138 mg/dL — ABNORMAL HIGH (ref 70–99)

## 2020-05-01 MED ORDER — ENOXAPARIN SODIUM 40 MG/0.4ML ~~LOC~~ SOLN
40.0000 mg | SUBCUTANEOUS | Status: DC
  Administered 2020-05-02 – 2020-05-11 (×10): 40 mg via SUBCUTANEOUS
  Filled 2020-05-01 (×10): qty 0.4

## 2020-05-01 NOTE — Progress Notes (Signed)
PROGRESS NOTE    Tiffany Coleman  WNU:[id number] DOB: [birth year] DOA: 04/05/2020 PCP: Nolene Ebbs, MD    Brief Narrative:  Tiffany Coleman is a 64 y.o. female with a history of morbid obesity, OSA not on CPAP, GAD, GERD, and recent covid-19 diagnosis at PCP office who presented to the ED 8/23 having been found passed out and in respiratory distress at home. In the ED, she was febrile, tachycardic, tachypneic 78% on 15L NRB with hypoxia confirmed pO2 27 on VBG. CTA chest was performed showing no PE but did reveal extensive bilateral parenchymal infiltrates consistent with covid-19 pneumonia. Remdesivir, steroids, and baricitinib started and the patient was admitted to medicine service after consultation with PCCM. Patient has prolonged hospitalization because of continuous need for high flow oxygen. Some improvement with diuresis started on 9/11.  Assessment & Plan:   Active Problems:   Obstructive sleep apnea   Generalized anxiety disorder   Morbid obesity (HCC)   Hypertension   GERD (gastroesophageal reflux disease)   Acute hypoxemic respiratory failure due to COVID-19 [facility])  Acute hypoxemic respiratory failure due to COVID-19 pneumonia: Continue to monitor due to significant symptoms  chest physiotherapy, incentive spirometry, deep breathing exercises, sputum induction, mucolytic's and bronchodilators. Supplemental oxygen to keep saturations more than 85 %. Covid directed therapy with , steroids, patient treated with IV steroids, almost 3 weeks now, will taper off.  remdesivir, completed 5 days of therapy Baricitinib, started 8/25-8/8, finished 2 weeks therapy. On albuterol inhaler.  Added steroid inhalers. On IV Lasix with good diuresis and some clinical response. Patient with persistent high oxygen requirement from initial Covid diagnosis, she is 23 days in the hospital.  Because of persistent symptoms, will consider continuing isolation  precautions.   COVID-19 Labs  No results for input(s): DDIMER, FERRITIN, LDH, CRP in the last 72 hours.  Lab Results  Component Value Date   SARSCOV2NAA POSITIVE (A) 04/05/2020   SpO2: 94 % O2 Flow Rate (L/min): 6 L/min FiO2 (%): 94 % (decreased HFNC to 12 LPM)  Mild intermittent asthma: stable , complicating recovery from pneumonia.  Obstructive sleep apnea, not using CPAP at home.  Complicating oxygen requirement.  Untreated sleep apnea.  Acute kidney injury: Improved.  Normalized.  Abnormal LFT: Improving.  Normalized.  Hepatic Function Latest Ref Rng & Units 04/28/2020 04/27/2020 04/26/2020  Total Protein 6.5 - 8.1 g/dL 6.6 6.3(L) 6.5  Albumin 3.5 - 5.0 g/dL 3.4(L) 3.1(L) 3.3(L)  AST 15 - 41 U/L _0 ALT 0 - 44 U/L 36 36 41  Alk Phosphatase 38 - 126 U/L 58 49 54  Total Bilirubin 0.3 - 1.2 mg/dL 0.5 0.6 0.7     DVT prophylaxis: Lovenox subcu   Code Status: full code  Family Communication: daughter Tenny Craw on the phone. Disposition Plan: Status is: Inpatient  Remains inpatient appropriate because:Inpatient level of care appropriate due to severity of illness   Dispo: The patient is from: Home              Anticipated d/c is to: home               Anticipated d/c date is: 2- 3 days               Patient currently is not medically stable to d/c.  Unsafe discharge plan.  Still on significant oxygen. Continues to drop saturation and not able to maintain on 6 L of oxygen on mobility.  Anticipate home when she can mobilize on  6 or less liters of oxygen.   Consultants:   None  Procedures:   None  Antimicrobials:  Antibiotics Given (last 72 hours)    None         Subjective: Patient seen and examined no overnight events.  Feels congested.  Feels good at rest.  Still difficulty with mobility and unable to maintain on 6 L.  Objective: Vitals:   04/30/20 1124 04/30/20 2117 05/01/20 0427 05/01/20 0800  BP: 116/74 125/81 126/80   Pulse: 94 86 88   Resp:  _0 Temp: 97.7 F (36.5 C) 98.9 F (37.2 C) 98.1 F (36.7 C)   TempSrc: Oral Oral Oral   SpO2: 97% 100% 95% 94%  Weight:      Height:        Intake/Output Summary (Last 24 hours) at 05/01/2020 1041 Last data filed at 05/01/2020 0430 Gross per 24 hour  Intake 360 ml  Output 1600 ml  Net -1240 ml   Filed Weights   04/09/20 0550 04/12/20 1727 04/21/20 0756  Weight: 99.1 kg 95.6 kg 94.1 kg    Examination:  Physical Exam Constitutional:      Comments: Chronically sick looking.  Currently on 6 L oxygen at rest.  Mildly anxious.  Not in any distress at rest.  Pulmonary:     Comments: Decreased bilateral air entry.  Some conducted airway sounds.  No other added sounds. Abdominal:     General: Bowel sounds are normal.     Palpations: Abdomen is soft.  Neurological:     General: No focal deficit present.     Mental Status: She is oriented to person, place, and time.     Comments: Generalized weakness.  No focal deficits.     Data Reviewed: I have personally reviewed following labs and imaging studies  CBC: Recent Labs  Lab 04/25/20 0621 04/26/20 0437 04/27/20 0449 04/28/20 0512  WBC 11.1* 10.0 9.3 11.9*  HGB 11.4* 11.6* 11.4* 12.2  HCT 36.6 36.9 37.0 40.4  MCV 88.0 88.3 88.9 90.6  PLT 177 160 142* 945   Basic Metabolic Panel: Recent Labs  Lab 04/25/20 0621 04/26/20 0437 04/27/20 0449 04/28/20 0512  NA 138 136 135 136  K 4.1 4.1 4.1 4.2  CL 92* 91* 90* 92*  CO2 33* 33* 34* 31  GLUCOSE 115* 124* 125* 134*  BUN 31* 35* 30* 34*  CREATININE 0.81 0.91 0.97 0.83  CALCIUM 10.1 10.1 9.8 10.1   GFR: Estimated Creatinine Clearance: 72.7 mL/min (by C-G formula based on SCr of 0.83 mg/dL). Liver Function Tests: Recent Labs  Lab 04/25/20 0621 04/26/20 0437 04/27/20 0449 04/28/20 0512  AST _1 ALT 46* 41 36 36  ALKPHOS 58 54 49 58  BILITOT 0.7 0.7 0.6 0.5  PROT 6.7 6.5 6.3* 6.6  ALBUMIN 3.3* 3.3* 3.1* 3.4*   No results for input(s):  LIPASE, AMYLASE in the last 168 hours. No results for input(s): AMMONIA in the last 168 hours. Coagulation Profile: No results for input(s): INR, PROTIME in the last 168 hours. Cardiac Enzymes: No results for input(s): CKTOTAL, CKMB, CKMBINDEX, TROPONINI in the last 168 hours. BNP (last 3 results) No results for input(s): PROBNP in the last 8760 hours. HbA1C: No results for input(s): HGBA1C in the last 72 hours. CBG: Recent Labs  Lab 04/30/20 0830 04/30/20 1120 04/30/20 1737 04/30/20 2114 05/01/20 0757  GLUCAP 106* 157* 190* 187* 110*   Lipid Profile: No results for input(s): CHOL, HDL,  LDLCALC, TRIG, CHOLHDL, LDLDIRECT in the last 72 hours. Thyroid Function Tests: No results for input(s): TSH, T4TOTAL, FREET4, T3FREE, THYROIDAB in the last 72 hours. Anemia Panel: No results for input(s): VITAMINB12, FOLATE, FERRITIN, TIBC, IRON, RETICCTPCT in the last 72 hours. Sepsis Labs: No results for input(s): PROCALCITON, LATICACIDVEN in the last 168 hours.  No results found for this or any previous visit (from the past 240 hour(s)).       Radiology Studies: No results found.      Scheduled Meds: . vitamin C  500 mg Oral Daily  . dextromethorphan-guaiFENesin  1 tablet Oral BID  . enoxaparin (LOVENOX) injection  50 mg Subcutaneous Q24H  . feeding supplement (KATE FARMS STANDARD 1.4)  325 mL Oral Daily  . furosemide  20 mg Intravenous BID  . Ipratropium-Albuterol  1 puff Inhalation TID  . linagliptin  5 mg Oral Daily  . mouth rinse  15 mL Mouth Rinse BID  . mometasone-formoterol  2 puff Inhalation BID  . multivitamin with minerals  1 tablet Oral Daily  . nystatin  5 mL Oral QID  . oxymetazoline  1 spray Each Nare BID  . pantoprazole  40 mg Oral Daily  . [START ON 05/02/2020] predniSONE  40 mg Oral Q breakfast   Followed by  . [START ON 05/04/2020] predniSONE  30 mg Oral Q breakfast   Followed by  . [START ON 05/06/2020] predniSONE  20 mg Oral Q breakfast   Followed by   . [START ON 05/08/2020] predniSONE  10 mg Oral Q breakfast   Followed by  . [START ON 05/10/2020] predniSONE  5 mg Oral Q breakfast  . zinc sulfate  220 mg Oral Daily   Continuous Infusions:   LOS: 26 days    Time spent: 30 minutes    Barb Merino, MD [facility] Pager [phone]

## 2020-05-02 LAB — GLUCOSE, CAPILLARY
Glucose-Capillary: 108 mg/dL — ABNORMAL HIGH (ref 70–99)
Glucose-Capillary: 178 mg/dL — ABNORMAL HIGH (ref 70–99)
Glucose-Capillary: 149 mg/dL — ABNORMAL HIGH (ref 70–99)

## 2020-05-02 NOTE — Progress Notes (Signed)
PROGRESS NOTE    Tiffany Coleman  KHT:[id number] DOB: 10/12/1955 DOA: 04/05/2020 PCP: Nolene Ebbs, MD    Brief Narrative:  Tiffany Coleman is a 64 y.o. female with a history of morbid obesity, OSA not on CPAP, GAD, GERD, and recent covid-19 diagnosis at PCP office who presented to the ED 8/23 having been found passed out and in respiratory distress at home. In the ED, she was febrile, tachycardic, tachypneic 78% on 15L NRB with hypoxia confirmed pO2 27 on VBG. CTA chest was performed showing no PE but did reveal extensive bilateral parenchymal infiltrates consistent with covid-19 pneumonia. Remdesivir, steroids, and baricitinib started and the patient was admitted to medicine service after consultation with PCCM. Patient has prolonged hospitalization because of continuous need for high flow oxygen. Some improvement with diuresis started on 9/11. Patient has much clinical improvement, still remains hypoxic on mobility. 9/19, 4 weeks in the hospital, discontinue COVID-19 airborne isolation.  Start standard isolation.  Assessment & Plan:   Active Problems:   Obstructive sleep apnea   Generalized anxiety disorder   Morbid obesity ([place])   Hypertension   GERD (gastroesophageal reflux disease)   Acute hypoxemic respiratory failure due to COVID-19 [facility])  Acute hypoxemic respiratory failure due to COVID-19 pneumonia: Continue to monitor due to significant symptoms, still hypoxic on mobility. chest physiotherapy, incentive spirometry, deep breathing exercises, sputum induction, mucolytic's and bronchodilators. Supplemental oxygen to keep saturations more than 85 %. Covid directed therapy with , steroids, treated with prolonged steroids, will taper off with oral prednisone. remdesivir, completed 5 days of therapy Baricitinib, started 8/25-8/8, finished 2 weeks therapy. On albuterol inhaler.  Added steroid inhalers. On IV Lasix with good diuresis and some clinical  response. 27 days in the hospital, discontinue airborne isolation today.   COVID-19 Labs  No results for input(s): DDIMER, FERRITIN, LDH, CRP in the last 72 hours.  Lab Results  Component Value Date   SARSCOV2NAA POSITIVE (A) 04/05/2020   SpO2: 95 % O2 Flow Rate (L/min): 6 L/min FiO2 (%): 94 % (decreased HFNC to 12 LPM)  Mild intermittent asthma: stable , complicating recovery from pneumonia.  Obstructive sleep apnea, not using CPAP at home.  Complicating oxygen requirement.  Untreated sleep apnea.  Acute kidney injury: Improved.  Normalized.  Abnormal LFT: Improving.  Normalized.  Hepatic Function Latest Ref Rng & Units 04/28/2020 04/27/2020 04/26/2020  Total Protein 6.5 - 8.1 g/dL 6.6 6.3(L) 6.5  Albumin 3.5 - 5.0 g/dL 3.4(L) 3.1(L) 3.3(L)  AST 15 - 41 U/L _0 ALT 0 - 44 U/L 36 36 41  Alk Phosphatase 38 - 126 U/L 58 49 54  Total Bilirubin 0.3 - 1.2 mg/dL 0.5 0.6 0.7     DVT prophylaxis: Lovenox subcu   Code Status: full code  Family Communication: daughter Tenny Craw on the phone 9/18. Disposition Plan: Status is: Inpatient  Remains inpatient appropriate because:Inpatient level of care appropriate due to severity of illness   Dispo: The patient is from: Home              Anticipated d/c is to: home               Anticipated d/c date is: 2- 3 days               Patient currently is not medically stable to d/c.  Unsafe discharge plan.  Still on significant oxygen. Continues to drop saturation and not able to maintain on 6 L of oxygen on mobility.  Anticipate home when she can mobilize on 6 or less liters of oxygen.   Consultants:   None  Procedures:   None  Antimicrobials:  Antibiotics Given (last 72 hours)    None         Subjective: Seen and examined.  No overnight events.  On 6 L oxygen at rest.  Was better able to tolerate some mobility yesterday.  Objective: Vitals:   05/01/20 1256 05/01/20 2108 05/02/20 0529 05/02/20 0800  BP: 130/90  113/78 112/77   Pulse: 89 82 92 88  Resp: _0 Temp: 97.9 F (36.6 C) 98.8 F (37.1 C) 98.7 F (37.1 C)   TempSrc: Oral Oral Oral   SpO2: 98% 100% 96% 95%  Weight:      Height:        Intake/Output Summary (Last 24 hours) at 05/02/2020 1117 Last data filed at 05/02/2020 0530 Gross per 24 hour  Intake --  Output 700 ml  Net -700 ml   Filed Weights   04/09/20 0550 04/12/20 1727 04/21/20 0756  Weight: 99.1 kg 95.6 kg 94.1 kg    Examination:  Physical Exam Constitutional:      Comments: Chronically sick looking.  Currently on 6 L oxygen at rest.  Mildly anxious.  Not in any distress at rest.  Pulmonary:     Comments: Decreased bilateral air entry.  Some conducted airway sounds.  No other added sounds. Abdominal:     General: Bowel sounds are normal.     Palpations: Abdomen is soft.  Neurological:     General: No focal deficit present.     Mental Status: She is oriented to person, place, and time.     Comments: Generalized weakness.  No focal deficits.     Data Reviewed: I have personally reviewed following labs and imaging studies  CBC: Recent Labs  Lab 04/26/20 0437 04/27/20 0449 04/28/20 0512  WBC 10.0 9.3 11.9*  HGB 11.6* 11.4* 12.2  HCT 36.9 37.0 40.4  MCV 88.3 88.9 90.6  PLT 160 142* 979   Basic Metabolic Panel: Recent Labs  Lab 04/26/20 0437 04/27/20 0449 04/28/20 0512  NA 136 135 136  K 4.1 4.1 4.2  CL 91* 90* 92*  CO2 33* 34* 31  GLUCOSE 124* 125* 134*  BUN 35* 30* 34*  CREATININE 0.91 0.97 0.83  CALCIUM 10.1 9.8 10.1   GFR: Estimated Creatinine Clearance: 72.7 mL/min (by C-G formula based on SCr of 0.83 mg/dL). Liver Function Tests: Recent Labs  Lab 04/26/20 0437 04/27/20 0449 04/28/20 0512  AST _1 ALT 41 36 36  ALKPHOS 54 49 58  BILITOT 0.7 0.6 0.5  PROT 6.5 6.3* 6.6  ALBUMIN 3.3* 3.1* 3.4*   No results for input(s): LIPASE, AMYLASE in the last 168 hours. No results for input(s): AMMONIA in the last 168  hours. Coagulation Profile: No results for input(s): INR, PROTIME in the last 168 hours. Cardiac Enzymes: No results for input(s): CKTOTAL, CKMB, CKMBINDEX, TROPONINI in the last 168 hours. BNP (last 3 results) No results for input(s): PROBNP in the last 8760 hours. HbA1C: No results for input(s): HGBA1C in the last 72 hours. CBG: Recent Labs  Lab 05/01/20 0757 05/01/20 1252 05/01/20 1620 05/01/20 2105 05/02/20 0740  GLUCAP 110* 186* 192* 138* 108*   Lipid Profile: No results for input(s): CHOL, HDL, LDLCALC, TRIG, CHOLHDL, LDLDIRECT in the last 72 hours. Thyroid Function Tests: No results for input(s): TSH, T4TOTAL, FREET4, T3FREE, THYROIDAB in the  last 72 hours. Anemia Panel: No results for input(s): VITAMINB12, FOLATE, FERRITIN, TIBC, IRON, RETICCTPCT in the last 72 hours. Sepsis Labs: No results for input(s): PROCALCITON, LATICACIDVEN in the last 168 hours.  No results found for this or any previous visit (from the past 240 hour(s)).       Radiology Studies: No results found.      Scheduled Meds: . vitamin C  500 mg Oral Daily  . dextromethorphan-guaiFENesin  1 tablet Oral BID  . enoxaparin (LOVENOX) injection  40 mg Subcutaneous Q24H  . feeding supplement (KATE FARMS STANDARD 1.4)  325 mL Oral Daily  . furosemide  20 mg Intravenous BID  . Ipratropium-Albuterol  1 puff Inhalation TID  . linagliptin  5 mg Oral Daily  . mouth rinse  15 mL Mouth Rinse BID  . mometasone-formoterol  2 puff Inhalation BID  . multivitamin with minerals  1 tablet Oral Daily  . nystatin  5 mL Oral QID  . oxymetazoline  1 spray Each Nare BID  . pantoprazole  40 mg Oral Daily  . predniSONE  40 mg Oral Q breakfast   Followed by  . [START ON 05/04/2020] predniSONE  30 mg Oral Q breakfast   Followed by  . [START ON 05/06/2020] predniSONE  20 mg Oral Q breakfast   Followed by  . [START ON 05/08/2020] predniSONE  10 mg Oral Q breakfast   Followed by  . [START ON 05/10/2020] predniSONE   5 mg Oral Q breakfast  . zinc sulfate  220 mg Oral Daily   Continuous Infusions:   LOS: 27 days    Time spent: 30 minutes    Barb Merino, MD [facility] Pager [phone]

## 2020-05-03 LAB — GLUCOSE, CAPILLARY
Glucose-Capillary: 103 mg/dL — ABNORMAL HIGH (ref 70–99)
Glucose-Capillary: 223 mg/dL — ABNORMAL HIGH (ref 70–99)
Glucose-Capillary: 187 mg/dL — ABNORMAL HIGH (ref 70–99)
Glucose-Capillary: 202 mg/dL — ABNORMAL HIGH (ref 70–99)

## 2020-05-03 NOTE — Progress Notes (Signed)
PROGRESS NOTE    Tiffany Coleman  MRN:[id number] DOB: 10/17/1955 DOA: 04/05/2020 PCP: Avbuere, Edwin, MD    Brief Narrative:  Tiffany Coleman is a 64 y.o. female with a history of morbid obesity, OSA not on CPAP, GAD, GERD, and recent covid-19 diagnosis at PCP office who presented to the ED 8/23 having been found passed out and in respiratory distress at home. In the ED, she was febrile, tachycardic, tachypneic 78% on 15L NRB with hypoxia confirmed pO2 27 on VBG. CTA chest was performed showing no PE but did reveal extensive bilateral parenchymal infiltrates consistent with covid-19 pneumonia. Remdesivir, steroids, and baricitinib started and the patient was admitted to medicine service after consultation with PCCM. Patient has prolonged hospitalization because of continuous need for high flow oxygen. Some improvement with diuresis started on 9/11. Patient has much clinical improvement, still remains hypoxic on mobility.  9/19, 4 weeks in the hospital, discontinue COVID-19 airborne isolation.  Start standard isolation.  Assessment & Plan:   Active Problems:   Obstructive sleep apnea   Generalized anxiety disorder   Morbid obesity ([facility])   Hypertension   GERD (gastroesophageal reflux disease)   Acute hypoxemic respiratory failure due to COVID-19 ([facility])  Acute hypoxemic respiratory failure due to COVID-19 pneumonia: Remains in the hospital with unsafe discharge, unable to maintain mobility on 6 L of oxygen. chest physiotherapy, incentive spirometry, deep breathing exercises, sputum induction, mucolytic's and bronchodilators. Supplemental oxygen to keep saturations more than 85 %. Covid directed therapy with , steroids, treated with prolonged steroids, will taper off with oral prednisone. remdesivir, completed 5 days of therapy Baricitinib, started 8/25-8/8, finished 2 weeks therapy. On albuterol inhaler.  Added steroid inhalers. On IV Lasix with good diuresis and some  clinical response.  COVID-19 Labs  No results for input(s): DDIMER, FERRITIN, LDH, CRP in the last 72 hours.  Lab Results  Component Value Date   SARSCOV2NAA POSITIVE (A) 04/05/2020   SpO2: 92 % O2 Flow Rate (L/min): 6 L/min FiO2 (%): 94 % (decreased HFNC to 12 LPM)  Mild intermittent asthma: stable , complicating recovery from pneumonia.  Obstructive sleep apnea, not using CPAP at home.  Complicating oxygen requirement.  Untreated sleep apnea.  Acute kidney injury: Improved.  Normalized.  Abnormal LFT: Improving.  Normalized.  Hepatic Function Latest Ref Rng & Units 04/28/2020 04/27/2020 04/26/2020  Total Protein 6.5 - 8.1 g/dL 6.6 6.3(L) 6.5  Albumin 3.5 - 5.0 g/dL 3.4(L) 3.1(L) 3.3(L)  AST 15 - 41 U/L 17 16 16  ALT 0 - 44 U/L 36 36 41  Alk Phosphatase 38 - 126 U/L 58 49 54  Total Bilirubin 0.3 - 1.2 mg/dL 0.5 0.6 0.7     DVT prophylaxis: Lovenox subcu   Code Status: full code  Family Communication: daughter Niyoka on the phone 9/19. Disposition Plan: Status is: Inpatient  Remains inpatient appropriate because:Inpatient level of care appropriate due to severity of illness   Dispo: The patient is from: Home              Anticipated d/c is to: home               Anticipated d/c date is: 2- 3 days, once oxygenation stable on mobility.              Patient currently is not medically stable to d/c.  Unsafe discharge plan.  Still on significant oxygen. Continues to drop saturation and not able to maintain on 6 L of oxygen on mobility.    Anticipate home when she can mobilize on 6 or less liters of oxygen.   Consultants:   None  Procedures:   None  Antimicrobials:  Antibiotics Given (last 72 hours)    None         Subjective: Seen and examined.  No overnight events.  Some cough remains.  Not mobilized yet today.  Objective: Vitals:   05/02/20 1424 05/02/20 2047 05/03/20 0611 05/03/20 0740  BP: 118/70 125/64 (!) 95/59   Pulse: 93 89 87   Resp: _0 Temp: 98.3 F (36.8 C) 98.3 F (36.8 C) 98.3 F (36.8 C)   TempSrc: Oral Oral Oral   SpO2: 98% 100% 100% 92%  Weight:      Height:        Intake/Output Summary (Last 24 hours) at 05/03/2020 1048 Last data filed at 05/03/2020 0400 Gross per 24 hour  Intake 360 ml  Output 1951 ml  Net -1591 ml   Filed Weights   04/09/20 0550 04/12/20 1727 04/21/20 0756  Weight: 99.1 kg 95.6 kg 94.1 kg    Examination:  Physical Exam Constitutional:      Comments: Chronically sick looking.  Currently on 6 L oxygen at rest.  Mildly anxious.  Not in any distress at rest.  Pulmonary:     Comments: Decreased bilateral air entry.  Some conducted airway sounds.  No other added sounds. Abdominal:     General: Bowel sounds are normal.     Palpations: Abdomen is soft.  Neurological:     General: No focal deficit present.     Mental Status: She is oriented to person, place, and time.     Comments: Generalized weakness.  No focal deficits.     Data Reviewed: I have personally reviewed following labs and imaging studies  CBC: Recent Labs  Lab 04/27/20 0449 04/28/20 0512  WBC 9.3 11.9*  HGB 11.4* 12.2  HCT 37.0 40.4  MCV 88.9 90.6  PLT 142* 786   Basic Metabolic Panel: Recent Labs  Lab 04/27/20 0449 04/28/20 0512  NA 135 136  K 4.1 4.2  CL 90* 92*  CO2 34* 31  GLUCOSE 125* 134*  BUN 30* 34*  CREATININE 0.97 0.83  CALCIUM 9.8 10.1   GFR: Estimated Creatinine Clearance: 72.7 mL/min (by C-G formula based on SCr of 0.83 mg/dL). Liver Function Tests: Recent Labs  Lab 04/27/20 0449 04/28/20 0512  AST 16 17  ALT 36 36  ALKPHOS 49 58  BILITOT 0.6 0.5  PROT 6.3* 6.6  ALBUMIN 3.1* 3.4*   No results for input(s): LIPASE, AMYLASE in the last 168 hours. No results for input(s): AMMONIA in the last 168 hours. Coagulation Profile: No results for input(s): INR, PROTIME in the last 168 hours. Cardiac Enzymes: No results for input(s): CKTOTAL, CKMB, CKMBINDEX, TROPONINI in the last  168 hours. BNP (last 3 results) No results for input(s): PROBNP in the last 8760 hours. HbA1C: No results for input(s): HGBA1C in the last 72 hours. CBG: Recent Labs  Lab 05/01/20 2105 05/02/20 0740 05/02/20 1255 05/02/20 1707 05/03/20 0742  GLUCAP 138* 108* 178* 149* 103*   Lipid Profile: No results for input(s): CHOL, HDL, LDLCALC, TRIG, CHOLHDL, LDLDIRECT in the last 72 hours. Thyroid Function Tests: No results for input(s): TSH, T4TOTAL, FREET4, T3FREE, THYROIDAB in the last 72 hours. Anemia Panel: No results for input(s): VITAMINB12, FOLATE, FERRITIN, TIBC, IRON, RETICCTPCT in the last 72 hours. Sepsis Labs: No results for input(s): PROCALCITON, LATICACIDVEN in  the last 168 hours.  No results found for this or any previous visit (from the past 240 hour(s)).       Radiology Studies: No results found.      Scheduled Meds: . vitamin C  500 mg Oral Daily  . dextromethorphan-guaiFENesin  1 tablet Oral BID  . enoxaparin (LOVENOX) injection  40 mg Subcutaneous Q24H  . feeding supplement (KATE FARMS STANDARD 1.4)  325 mL Oral Daily  . furosemide  20 mg Intravenous BID  . Ipratropium-Albuterol  1 puff Inhalation TID  . linagliptin  5 mg Oral Daily  . mouth rinse  15 mL Mouth Rinse BID  . mometasone-formoterol  2 puff Inhalation BID  . multivitamin with minerals  1 tablet Oral Daily  . nystatin  5 mL Oral QID  . oxymetazoline  1 spray Each Nare BID  . pantoprazole  40 mg Oral Daily  . [START ON 05/04/2020] predniSONE  30 mg Oral Q breakfast   Followed by  . [START ON 05/06/2020] predniSONE  20 mg Oral Q breakfast   Followed by  . [START ON 05/08/2020] predniSONE  10 mg Oral Q breakfast   Followed by  . [START ON 05/10/2020] predniSONE  5 mg Oral Q breakfast  . zinc sulfate  220 mg Oral Daily   Continuous Infusions:   LOS: 28 days    Time spent: 30 minutes    Barb Merino, MD [facility] Pager [phone]

## 2020-05-03 NOTE — Progress Notes (Signed)
Physical Therapy Treatment Patient Details Name: Tiffany Coleman MRN: [id number] DOB: 03/03/1956 Today's Date: 05/03/2020    History of Present Illness 64 y.o. female with a history of morbid obesity, OSA not on CPAP, GAD, GERD, and recent covid-19 diagnosis at PCP office who presented to the ED 8/23 having been found passed out and in respiratory distress at home.  Pt admitted for Acute hypoxemic respiratory failure due to COVID-19 pneumonia    PT Comments    Pt ambulated in hallway however HR increased and SpO2 decreased so returned to room (see mobility section below for more details).  Pt requiring at least 6L HFNC during exertional tasks.   Follow Up Recommendations  Home health PT;Supervision - Intermittent     Equipment Recommendations  Rolling walker with 5" wheels;3in1 (PT)    Recommendations for Other Services       Precautions / Restrictions Precautions Precautions: Fall Precaution Comments: monitor O2 sats and HR, currently on 5-6L HFNC    Mobility  Bed Mobility               General bed mobility comments: pt up in recliner  Transfers Overall transfer level: Needs assistance Equipment used: Rolling walker (2 wheeled) Transfers: Sit to/from Stand Sit to Stand: Min guard         General transfer comment: cues for hand placement, Pt on 5L HFNC at rest, SpO2 95%  Ambulation/Gait Ambulation/Gait assistance: Min guard Gait Distance (Feet): 200 Feet Assistive device: Rolling walker (2 wheeled) Gait Pattern/deviations: Step-through pattern;Decreased stride length     General Gait Details: verbal cues for use of RW, SpO2 dropped to 65-70% on 6L O2 [place] even with pursed lip rest breaks and HR up to 146 bpm, pt also with shaky UEs so return to room, SpO2 improved to 85-86% upon return to room and pt left on 5L (SpO2 discussed with RN)   Stairs             Wheelchair Mobility    Modified Rankin (Stroke Patients Only)       Balance                                             Cognition Arousal/Alertness: Awake/alert Behavior During Therapy: WFL for tasks assessed/performed Overall Cognitive Status: Within Functional Limits for tasks assessed                                        Exercises      General Comments        Pertinent Vitals/Pain Pain Assessment: No/denies pain Pain Intervention(s): Repositioned;Monitored during session    Home Living                      Prior Function            PT Goals (current goals can now be found in the care plan section) Progress towards PT goals: Progressing toward goals    Frequency    Min 3X/week      PT Plan Current plan remains appropriate    Co-evaluation              AM-PAC PT "6 Clicks" Mobility   Outcome Measure  Help needed turning from your back to your side while in  a flat bed without using bedrails?: A Little Help needed moving from lying on your back to sitting on the side of a flat bed without using bedrails?: A Little Help needed moving to and from a bed to a chair (including a wheelchair)?: A Little Help needed standing up from a chair using your arms (e.g., wheelchair or bedside chair)?: A Little Help needed to walk in hospital room?: A Little Help needed climbing 3-5 steps with a railing? : A Lot 6 Click Score: 17    End of Session Equipment Utilized During Treatment: Oxygen Activity Tolerance: Patient tolerated treatment well Patient left: with call bell/phone within reach;in chair Nurse Communication: Mobility status PT Visit Diagnosis: Difficulty in walking, not elsewhere classified (R26.2)     Time: 2423-5361 PT Time Calculation (min) (ACUTE ONLY): 18 min  Charges:  $Gait Training: 8-22 mins                     Arlyce Dice, DPT Acute Rehabilitation Services Pager: [phone] Office: [phone]  York Ram E 05/03/2020, 4:24 PM

## 2020-05-04 LAB — GLUCOSE, CAPILLARY
Glucose-Capillary: 115 mg/dL — ABNORMAL HIGH (ref 70–99)
Glucose-Capillary: 142 mg/dL — ABNORMAL HIGH (ref 70–99)
Glucose-Capillary: 222 mg/dL — ABNORMAL HIGH (ref 70–99)
Glucose-Capillary: 187 mg/dL — ABNORMAL HIGH (ref 70–99)

## 2020-05-04 MED ORDER — IPRATROPIUM-ALBUTEROL 20-100 MCG/ACT IN AERS
1.0000 | INHALATION_SPRAY | Freq: Two times a day (BID) | RESPIRATORY_TRACT | Status: DC
  Administered 2020-05-05 – 2020-05-07 (×6): 1 via RESPIRATORY_TRACT
  Filled 2020-05-04: qty 4

## 2020-05-04 NOTE — Progress Notes (Signed)
PROGRESS NOTE    Tiffany Coleman  BZJ:[id number] DOB: 09-06-1955 DOA: 04/05/2020 PCP: Nolene Ebbs, MD    Brief Narrative:  Tiffany Coleman is a 64 y.o. female with a history of morbid obesity, OSA not on CPAP, GAD, GERD, and recent covid-19 diagnosis at PCP office who presented to the ED 8/23 having been found passed out and in respiratory distress at home. In the ED, she was febrile, tachycardic, tachypneic 78% on 15L NRB with hypoxia confirmed pO2 27 on VBG. CTA chest was performed showing no PE but did reveal extensive bilateral parenchymal infiltrates consistent with covid-19 pneumonia. Remdesivir, steroids, and baricitinib started and the patient was admitted to medicine service after consultation with PCCM. Patient has prolonged hospitalization because of continuous need for high flow oxygen. Some improvement with diuresis started on 9/11. Patient has much clinical improvement, still remains hypoxic on mobility.  9/19, 4 weeks in the hospital, discontinue COVID-19 airborne isolation.  Start standard isolation. Maintaining on 5 to 6 L of oxygen at rest, desaturates to 70s on mobility on 6 L. Lives alone, needs to maintain mobility on 6 L or less oxygen before discharge.  Assessment & Plan:   Active Problems:   Obstructive sleep apnea   Generalized anxiety disorder   Morbid obesity (HCC)   Hypertension   GERD (gastroesophageal reflux disease)   Acute hypoxemic respiratory failure due to COVID-19 [facility])  Acute hypoxemic respiratory failure due to COVID-19 pneumonia: Remains in the hospital with unsafe discharge, unable to maintain mobility on 6 L of oxygen. chest physiotherapy, incentive spirometry, deep breathing exercises, sputum induction, mucolytic's and bronchodilators. Supplemental oxygen to keep saturations more than 85 %. Covid directed therapy with , steroids, treated with prolonged steroids, will taper off with oral prednisone. remdesivir, completed 5  days of therapy Baricitinib, started 8/25-8/8, finished 2 weeks therapy. On albuterol inhaler.  Added steroid inhalers. On IV Lasix with good diuresis and some clinical response. We will recheck electrolytes.  COVID-19 Labs  No results for input(s): DDIMER, FERRITIN, LDH, CRP in the last 72 hours.  Lab Results  Component Value Date   SARSCOV2NAA POSITIVE (A) 04/05/2020   SpO2: 94 % O2 Flow Rate (L/min): 5 L/min FiO2 (%): 94 % (decreased HFNC to 12 LPM)  Mild intermittent asthma: stable , complicating recovery from pneumonia.  Obstructive sleep apnea, not using CPAP at home.  Complicating oxygen requirement.  Untreated sleep apnea.  Acute kidney injury: Improved.  Normalized.  Abnormal LFT: Improving.  Normalized.  Hepatic Function Latest Ref Rng & Units 04/28/2020 04/27/2020 04/26/2020  Total Protein 6.5 - 8.1 g/dL 6.6 6.3(L) 6.5  Albumin 3.5 - 5.0 g/dL 3.4(L) 3.1(L) 3.3(L)  AST 15 - 41 U/L _0 ALT 0 - 44 U/L 36 36 41  Alk Phosphatase 38 - 126 U/L 58 49 54  Total Bilirubin 0.3 - 1.2 mg/dL 0.5 0.6 0.7     DVT prophylaxis: Lovenox subcu   Code Status: full code  Family Communication: daughter Tenny Craw on the phone 9/19. Disposition Plan: Status is: Inpatient  Remains inpatient appropriate because:Inpatient level of care appropriate due to severity of illness   Dispo: The patient is from: Home              Anticipated d/c is to: home               Anticipated d/c date is: 2- 3 days, once oxygenation stable on mobility.  Patient currently is not medically stable to d/c.  Unsafe discharge plan.  Still on significant oxygen. Continues to drop saturation and not able to maintain on 6 L of oxygen on mobility.  Anticipate home when she can mobilize on 6 or less liters of oxygen.   Consultants:   None  Procedures:   None  Antimicrobials:  Antibiotics Given (last 72 hours)    None         Subjective: Seen and examined.  No overnight events.   Continues to have some cough.  Feels good at rest.  Yesterday walked around the hallway on 6 L of oxygen, became tachycardic and hypoxic but quickly recovered. Saturation dropped on mobility to bathroom.  Objective: Vitals:   05/04/20 0307 05/04/20 0507 05/04/20 0700 05/04/20 0852  BP:  113/69 115/73   Pulse:  (!) 110 89   Resp: _0 Temp:  98.8 F (37.1 C) 98.1 F (36.7 C)   TempSrc:  Oral Oral   SpO2:  91% 100% 94%  Weight:      Height:        Intake/Output Summary (Last 24 hours) at 05/04/2020 1145 Last data filed at 05/04/2020 0800 Gross per 24 hour  Intake --  Output 540 ml  Net -540 ml   Filed Weights   04/09/20 0550 04/12/20 1727 04/21/20 0756  Weight: 99.1 kg 95.6 kg 94.1 kg    Examination:  Physical Exam Constitutional:      Comments: Chronically sick looking.  Comfortable at rest.  Currently on 5 L oxygen.  Pulmonary:     Comments: Decreased bilateral air entry.  Some conducted airway sounds.  No other added sounds. Abdominal:     General: Bowel sounds are normal.     Palpations: Abdomen is soft.  Neurological:     General: No focal deficit present.     Mental Status: She is oriented to person, place, and time.     Comments: Generalized weakness.  No focal deficits.     Data Reviewed: I have personally reviewed following labs and imaging studies  CBC: Recent Labs  Lab 04/28/20 0512  WBC 11.9*  HGB 12.2  HCT 40.4  MCV 90.6  PLT 254   Basic Metabolic Panel: Recent Labs  Lab 04/28/20 0512  NA 136  K 4.2  CL 92*  CO2 31  GLUCOSE 134*  BUN 34*  CREATININE 0.83  CALCIUM 10.1   GFR: Estimated Creatinine Clearance: 72.7 mL/min (by C-G formula based on SCr of 0.83 mg/dL). Liver Function Tests: Recent Labs  Lab 04/28/20 0512  AST 17  ALT 36  ALKPHOS 58  BILITOT 0.5  PROT 6.6  ALBUMIN 3.4*   No results for input(s): LIPASE, AMYLASE in the last 168 hours. No results for input(s): AMMONIA in the last 168 hours. Coagulation  Profile: No results for input(s): INR, PROTIME in the last 168 hours. Cardiac Enzymes: No results for input(s): CKTOTAL, CKMB, CKMBINDEX, TROPONINI in the last 168 hours. BNP (last 3 results) No results for input(s): PROBNP in the last 8760 hours. HbA1C: No results for input(s): HGBA1C in the last 72 hours. CBG: Recent Labs  Lab 05/03/20 1231 05/03/20 1552 05/03/20 2121 05/04/20 0732 05/04/20 1128  GLUCAP 223* 187* 202* 115* 142*   Lipid Profile: No results for input(s): CHOL, HDL, LDLCALC, TRIG, CHOLHDL, LDLDIRECT in the last 72 hours. Thyroid Function Tests: No results for input(s): TSH, T4TOTAL, FREET4, T3FREE, THYROIDAB in the last 72 hours. Anemia Panel: No results  for input(s): VITAMINB12, FOLATE, FERRITIN, TIBC, IRON, RETICCTPCT in the last 72 hours. Sepsis Labs: No results for input(s): PROCALCITON, LATICACIDVEN in the last 168 hours.  No results found for this or any previous visit (from the past 240 hour(s)).       Radiology Studies: No results found.      Scheduled Meds: . vitamin C  500 mg Oral Daily  . dextromethorphan-guaiFENesin  1 tablet Oral BID  . enoxaparin (LOVENOX) injection  40 mg Subcutaneous Q24H  . feeding supplement ([employer] STANDARD 1.4)  325 mL Oral Daily  . furosemide  20 mg Intravenous BID  . Ipratropium-Albuterol  1 puff Inhalation TID  . linagliptin  5 mg Oral Daily  . mouth rinse  15 mL Mouth Rinse BID  . mometasone-formoterol  2 puff Inhalation BID  . multivitamin with minerals  1 tablet Oral Daily  . nystatin  5 mL Oral QID  . oxymetazoline  1 spray Each Nare BID  . pantoprazole  40 mg Oral Daily  . predniSONE  30 mg Oral Q breakfast   Followed by  . [START ON 05/06/2020] predniSONE  20 mg Oral Q breakfast   Followed by  . [START ON 05/08/2020] predniSONE  10 mg Oral Q breakfast   Followed by  . [START ON 05/10/2020] predniSONE  5 mg Oral Q breakfast  . zinc sulfate  220 mg Oral Daily   Continuous Infusions:   LOS: 29  days    Time spent: 30 minutes    Barb Merino, MD [facility] Pager [phone]

## 2020-05-04 NOTE — Progress Notes (Signed)
Nutrition Follow-up  RD working remotely.  DOCUMENTATION CODES:   Obesity unspecified  INTERVENTION:  - continue [employer] 1.4 po BID. - weigh patient today.  - will complete NFPE when feasible.   NUTRITION DIAGNOSIS:   Increased nutrient needs related to acute illness, catabolic illness ([id number] PNA) as evidenced by estimated needs. -ongoing  GOAL:   Patient will meet greater than or equal to 90% of their needs -met on average  MONITOR:   PO intake, Supplement acceptance, Labs, Weight trends  ASSESSMENT:   64 y.o. female with medical history of morbid obesity, OSA not on CPAP, GAD, GERD, and recent COVID-19 diagnosis at PCP office. She presented to the ED 8/23 having been found passed out and in respiratory distress at home. In the ED, she was febrile, tachycardic, tachypneic 78% on 15L NRB with hypoxia. CT angio chest showed no PE but did show extensive bilateral parenchymal infiltrates consistent with COVID-19 PNA. Remdesivir, steroids, and baricitinib started.  She has mostly been eating 75-100% over the past 5 days. [employer] [place] is ordered BID and she has been accepting this supplement ~90% of the time offered. She has not been weighed since 9/8.   Patient is now off of precautions for COVID.   Per notes: - recovering from COVID-19 infection and associated PNA - AKI--improved - LFTs have now normalized - anticipated d/c is in 2-3 days   Labs reviewed; CBGs: 115 and 142 mg/dl. Medications reviewed; 500 mg ascorbic acid/day, 20 mg IV lasix BID, 5 mg tradjenta/day, 1 tablet multivitamin with minerals/day, 40 mg oral protonix/day, prednisone taper, 220 mg zinc sulfate/day, 5 ml mycostatin QID.    NUTRITION - FOCUSED PHYSICAL EXAM:  unable to complete at this time.   Diet Order:   Diet Order            Diet Carb Modified Fluid consistency: Thin; Room service appropriate? Yes  Diet effective now                 EDUCATION NEEDS:   No education needs have been  identified at this time  Skin:  Skin Assessment: Reviewed RN Assessment  Last BM:  9/20 (type 1)  Height:   Ht Readings from Last 1 Encounters:  04/12/20 _0  (1.6 m)    Weight:   Wt Readings from Last 1 Encounters:  04/21/20 94.1 kg     Estimated Nutritional Needs:  Kcal:  1880-2075 kcal Protein:  100-110 grams Fluid:  >/= 2 L/day     Jarome Matin, MS, RD, LDN, CNSC Inpatient Clinical Dietitian RD pager # available in [facility]  After hours/weekend pager # available in [facility]

## 2020-05-04 NOTE — Progress Notes (Signed)
Occupational Therapy Treatment Patient Details Name: Tiffany Coleman MRN: [id number] DOB: Sep 02, 1955 Today's Date: 05/04/2020    History of present illness 64 y.o. female with a history of morbid obesity, OSA not on CPAP, GAD, GERD, and recent covid-19 diagnosis at PCP office who presented to the ED 8/23 having been found passed out and in respiratory distress at home.  Pt admitted for Acute hypoxemic respiratory failure due to COVID-19 pneumonia   OT comments  Pt progressing towards acute OT goals. Session limited by O2 in the upper 70s with seated activity even once O2 increased from 5 to 6L, HR 140 at highest observed. Pt motivated to continue with activity (toileting and sponge bath) though BSC brought to pt in lieu of walking to bathroom. Pt up in recliner at end of session on 6L HFNC with sats 87-88. D/c plan remains appropriate.    Follow Up Recommendations  Home health OT    Equipment Recommendations  Tub/shower seat    Recommendations for Other Services      Precautions / Restrictions Precautions Precautions: Fall Precaution Comments: monitor O2 sats and HR, currently on 5-6L HFNC Restrictions Weight Bearing Restrictions: No       Mobility Bed Mobility Overal bed mobility: Modified Independent                Transfers Overall transfer level: Needs assistance Equipment used: Rolling walker (2 wheeled);None Transfers: Stand Pivot Transfers Sit to Stand: Min guard Stand pivot transfers: Min guard       General transfer comment: stand-pivot to access BSC, pivotal steps to recliner. min guard.     Balance Overall balance assessment: No apparent balance deficits (not formally assessed)                                         ADL either performed or assessed with clinical judgement   ADL Overall ADL's : Needs assistance/impaired         Upper Body Bathing: Set up;Sitting   Lower Body Bathing: Minimal assistance;Sit to/from  stand;Sitting/lateral leans;Min guard           Toilet Transfer: Min guard;Stand-pivot;BSC   Toileting- Clothing Manipulation and Hygiene: Min guard;Sitting/lateral lean Toileting - Clothing Manipulation Details (indicate cue type and reason): sitting/lateral lean for pericare       General ADL Comments: Pt planned to walk to bathroom to void at start of session but O2 dropping into 70s and HR in 140s sitting EOB so SPT to [facility] [address]. Pt then completed UB/LB bathing and pericare before taking pivotal steps to sit up in recliner.  Began ec education and provided handout.     Vision       Perception     Praxis      Cognition Arousal/Alertness: Awake/alert Behavior During Therapy: WFL for tasks assessed/performed Overall Cognitive Status: Within Functional Limits for tasks assessed                                          Exercises Exercises: Other exercises Other Exercises Other Exercises: Issued level 1 theraband and provided HEP for general UB strengthening   Shoulder Instructions       General Comments received on 5L HFNC bumped up to 6L with EOB/OOB activity. O2 staying in the upper 70s, dropping occasionally into  low 70s and towards the end of session 67. Pt sitting up in recliner at end of session on 6L HFNC with sats 87. Pt report O2 at rest on 5L today has been around 87.     Pertinent Vitals/ Pain       Pain Assessment: No/denies pain  Home Living                                          Prior Functioning/Environment              Frequency  Min 2X/week        Progress Toward Goals  OT Goals(current goals can now be found in the care plan section)  Progress towards OT goals: Progressing toward goals  Acute Rehab OT Goals Patient Stated Goal: To get stronger and improve independence OT Goal Formulation: With patient Time For Goal Achievement: 05/12/20 Potential to Achieve Goals: Good ADL Goals Pt Will Transfer to  Toilet: with supervision;ambulating;regular height toilet;grab bars Pt Will Perform Toileting - Clothing Manipulation and hygiene: with supervision;sit to/from stand Pt/caregiver will Perform Home Exercise Program: Both right and left upper extremity;With theraband Additional ADL Goal #1: Patient will stand at sink to perform grooming as evidence of improving activity tolerance  Plan Discharge plan remains appropriate    Co-evaluation                 AM-PAC OT "6 Clicks" Daily Activity     Outcome Measure   Help from another person eating meals?: None Help from another person taking care of personal grooming?: A Little Help from another person toileting, which includes using toliet, bedpan, or urinal?: A Little Help from another person bathing (including washing, rinsing, drying)?: A Little Help from another person to put on and taking off regular upper body clothing?: A Little Help from another person to put on and taking off regular lower body clothing?: A Little 6 Click Score: 19    End of Session Equipment Utilized During Treatment: Oxygen  OT Visit Diagnosis: Muscle weakness (generalized) (M62.81)   Activity Tolerance Other (comment) (O2 dropping into 70s, HR 140 on BSC bumped up to 6L)   Patient Left in chair;with call bell/phone within reach   Nurse Communication Other (comment) (Nurse present for most of session)        Time: 1202-1232 OT Time Calculation (min): 30 min  Charges: OT General Charges $OT Visit: 1 Visit OT Treatments $Self Care/Home Management : 23-37 mins  Tyrone Schimke, OT Acute Rehabilitation Services Pager: [phone] Office: [phone]    Hortencia Pilar 05/04/2020, 12:47 PM

## 2020-05-04 NOTE — Plan of Care (Signed)
  Problem: Coping: °Goal: Psychosocial and spiritual needs will be supported °Outcome: Progressing °  °Problem: Respiratory: °Goal: Will maintain a patent airway °Outcome: Progressing °Goal: Complications related to the disease process, condition or treatment will be avoided or minimized °Outcome: Progressing °  °Problem: Health Behavior/Discharge Planning: °Goal: Ability to manage health-related needs will improve °Outcome: Progressing °  °Problem: Clinical Measurements: °Goal: Ability to maintain clinical measurements within normal limits will improve °Outcome: Progressing °Goal: Will remain free from infection °Outcome: Progressing °Goal: Diagnostic test results will improve °Outcome: Progressing °Goal: Respiratory complications will improve °Outcome: Progressing °Goal: Cardiovascular complication will be avoided °Outcome: Progressing °  °Problem: Activity: °Goal: Risk for activity intolerance will decrease °Outcome: Progressing °  °Problem: Coping: °Goal: Level of anxiety will decrease °Outcome: Progressing °  °Problem: Elimination: °Goal: Will not experience complications related to bowel motility °Outcome: Progressing °Goal: Will not experience complications related to urinary retention °Outcome: Progressing °  °Problem: Pain Managment: °Goal: General experience of comfort will improve °Outcome: Progressing °  °Problem: Safety: °Goal: Ability to remain free from injury will improve °Outcome: Progressing °  °Problem: Skin Integrity: °Goal: Risk for impaired skin integrity will decrease °Outcome: Progressing °  °

## 2020-05-05 LAB — BASIC METABOLIC PANEL
Anion gap: 11 (ref 5–15)
BUN: 26 mg/dL — ABNORMAL HIGH (ref 8–23)
CO2: 33 mmol/L — ABNORMAL HIGH (ref 22–32)
Calcium: 9.8 mg/dL (ref 8.9–10.3)
Chloride: 94 mmol/L — ABNORMAL LOW (ref 98–111)
Creatinine, Ser: 1.03 mg/dL — ABNORMAL HIGH (ref 0.44–1.00)
GFR calc Af Amer: >60 mL/min (ref >60)
GFR calc non Af Amer: 57 mL/min — ABNORMAL LOW (ref >60)
Glucose, Bld: 162 mg/dL — ABNORMAL HIGH (ref 70–99)
Potassium: 4.1 mmol/L (ref 3.5–5.1)
Sodium: 138 mmol/L (ref 135–145)

## 2020-05-05 LAB — GLUCOSE, CAPILLARY
Glucose-Capillary: 107 mg/dL — ABNORMAL HIGH (ref 70–99)
Glucose-Capillary: 131 mg/dL — ABNORMAL HIGH (ref 70–99)
Glucose-Capillary: 211 mg/dL — ABNORMAL HIGH (ref 70–99)
Glucose-Capillary: 205 mg/dL — ABNORMAL HIGH (ref 70–99)

## 2020-05-05 LAB — PHOSPHORUS: Phosphorus: 3.2 mg/dL (ref 2.5–4.6)

## 2020-05-05 LAB — MAGNESIUM: Magnesium: 2.2 mg/dL (ref 1.7–2.4)

## 2020-05-05 NOTE — Progress Notes (Signed)
Physical Therapy Treatment Patient Details Name: Tiffany Coleman MRN: [id number] DOB: Jul 16, 1956 Today's Date: 05/05/2020    History of Present Illness 64 y.o. female with a history of morbid obesity, OSA not on CPAP, GAD, GERD, and recent covid-19 diagnosis at PCP office who presented to the ED 8/23 having been found passed out and in respiratory distress at home.  Pt admitted for Acute hypoxemic respiratory failure due to COVID-19 pneumonia    PT Comments    Pt requesting to have BM so ambulated to/from bathroom.  SpO2 dropped to 75% on 8L HFNC with performing pericare and then ambulating to recliner.     Follow Up Recommendations  Home health PT;Supervision - Intermittent     Equipment Recommendations  Rolling walker with 5" wheels;3in1 (PT)    Recommendations for Other Services       Precautions / Restrictions Precautions Precautions: Fall Precaution Comments: monitor O2 sats and HR, currently on 6L HFNC    Mobility  Bed Mobility Overal bed mobility: Modified Independent                Transfers Overall transfer level: Needs assistance Equipment used: Rolling walker (2 wheeled) Transfers: Sit to/from Stand Sit to Stand: Min guard         General transfer comment: min/guard for safety  Ambulation/Gait Ambulation/Gait assistance: Min guard Gait Distance (Feet): 16 Feet Assistive device: Rolling walker (2 wheeled) Gait Pattern/deviations: Step-through pattern;Decreased stride length     General Gait Details: requested to use bathroom first for BM, SpO2 dropped to 80% on 6L O2 [place] and HR up to 140s bpm, cues for pursed lip breathing, SPO2 dropped to 75% on 8L with performing pericare and ambulating back to bed   Stairs             Wheelchair Mobility    Modified Rankin (Stroke Patients Only)       Balance                                            Cognition Arousal/Alertness: Awake/alert Behavior During  Therapy: WFL for tasks assessed/performed Overall Cognitive Status: Within Functional Limits for tasks assessed                                        Exercises      General Comments        Pertinent Vitals/Pain Pain Assessment: No/denies pain    Home Living                      Prior Function            PT Goals (current goals can now be found in the care plan section) Progress towards PT goals: Progressing toward goals    Frequency    Min 3X/week      PT Plan Current plan remains appropriate    Co-evaluation              AM-PAC PT "6 Clicks" Mobility   Outcome Measure  Help needed turning from your back to your side while in a flat bed without using bedrails?: A Little Help needed moving from lying on your back to sitting on the side of a flat bed without using bedrails?: A Little Help needed moving  to and from a bed to a chair (including a wheelchair)?: A Little Help needed standing up from a chair using your arms (e.g., wheelchair or bedside chair)?: A Little Help needed to walk in hospital room?: A Little Help needed climbing 3-5 steps with a railing? : A Lot 6 Click Score: 17    End of Session Equipment Utilized During Treatment: Oxygen Activity Tolerance: Patient limited by fatigue Patient left: with call bell/phone within reach;in chair Nurse Communication: Mobility status PT Visit Diagnosis: Difficulty in walking, not elsewhere classified (R26.2)     Time: 7505-1833 PT Time Calculation (min) (ACUTE ONLY): 24 min  Charges:  $Gait Training: 8-22 mins                     Tiffany Coleman, DPT Acute Rehabilitation Services Pager: [phone] Office: [phone]  Tiffany Coleman 05/05/2020, 3:46 PM

## 2020-05-05 NOTE — Progress Notes (Signed)
Despite being down to 6L O2, patient continues to refuse nocturnal CPAP. Equipment not in room at this time.

## 2020-05-05 NOTE — Progress Notes (Signed)
PROGRESS NOTE    Tiffany Coleman  WUJ:[id number] DOB: [birth year] DOA: 04/05/2020 PCP: Nolene Ebbs, MD    Brief Narrative:  Patient was admitted to the hospital working diagnosis of acute hypoxic respiratory failure due to SARS COVID-19 viral pneumonia.  64 year old female with past medical history for obesity class III, GERD, restrictive sleep apnea.  Patient tested positive for COVID-19 03/28/20, her symptoms were consistent with loss of taste and smell, poor appetite, cough, dyspnea, nausea, vomiting and diarrhea.  As an outpatient patient was treated with prednisone and azithromycin.  At home she had persistent symptoms of hypoxemia.  EMS was called and her oxygen saturation was down to 70s on room air.  On her initial physical examination she was febrile 104 F, tachycardic 118, tachypneic 38 bpm, oxygenation was 78% on 15 L nonrebreather.  Her lungs had rhonchi bilaterally along with rales, heart S1-S2, present, tachycardic, soft abdomen, no lower extremity edema.   Her chest radiograph had diffuse interstitial infiltrates, more prominent in the right upper lobe, but also present on right lower lobe, left upper lobe and left lower lobe.  Patient had a prolonged hospital stay, she has received steroids, MRCP need and remdesivir.  Continue to have high oxygen requirements.   Assessment & Plan:   Principal Problem:   Acute hypoxemic respiratory failure due to COVID-19 [facility]) Active Problems:   Obstructive sleep apnea   Generalized anxiety disorder   Morbid obesity (HCC)   Hypertension   GERD (gastroesophageal reflux disease)   1. Acute hypoxic respiratory failure due to SARS COVID 19 viral pneumonia.   RR: 18  Pulse oxymetry: 94%  Fi02: 4L/ min per [place]   Patient continue to improve, but not yet back to baseline. Continue to wean down oxygen supplementation. Patient has competed remdesivir and barcitinib. Continue with prednisone taper.  PT and PT, out of bed to chair.   Patient had furosemide for non cardiogenic pulmonary edema.   2. Asthma. No signs of acute exacerbation.   3. AKI. Clinically resolved.      Status is: Inpatient  Remains inpatient appropriate because inpatient therapies   Dispo:  Patient From: Home  Planned Disposition: Home with Health Care Svc  Expected discharge date:   Medically stable for discharge: No   DVT prophylaxis: Enoxaparin   Code Status:   full  Family Communication:  No family at the bedside      Nutrition Status: Nutrition Problem: Increased nutrient needs Etiology: acute illness, catabolic illness (NFAOZ-30 PNA) Signs/Symptoms: estimated needs Interventions: MVI, Other (Comment) [employer] 1.4 po; Prosource Plus)     Subjective: Patient continue to be very weal and deconditioned, positive dyspnea on exertion.   Objective: Vitals:   05/04/20 1401 05/04/20 2112 05/05/20 0457 05/05/20 0803  BP:  113/71 113/75   Pulse:  (!) 108 88   Resp:  20 18   Temp:  98.9 F (37.2 C) 98.9 F (37.2 C)   TempSrc:  Oral Oral   SpO2: 94% 98% 98% 92%  Weight:      Height:        Intake/Output Summary (Last 24 hours) at 05/05/2020 1426 Last data filed at 05/04/2020 1706 Gross per 24 hour  Intake --  Output 950 ml  Net -950 ml   Filed Weights   04/12/20 1727 04/21/20 0756 05/04/20 1151  Weight: 95.6 kg 94.1 kg 92.1 kg    Examination:   General: Not in pain, but resting dyspnea, deconditioned,  Neurology: Awake and alert, non focal  E ENT: no pallor, no icterus, oral mucosa moist Cardiovascular: No JVD. S1-S2 present, rhythmic, no gallops, rubs, or murmurs. No lower extremity edema. Pulmonary: positive breath sounds bilaterally,  Gastrointestinal. Abdomen soft and non tender Skin. No rashes Musculoskeletal: no joint deformities     Data Reviewed: I have personally reviewed following labs and imaging studies  CBC: No results for input(s): WBC, NEUTROABS, HGB, HCT, MCV, PLT in the last 168  hours. Basic Metabolic Panel: Recent Labs  Lab 05/05/20 0435  NA 138  K 4.1  CL 94*  CO2 33*  GLUCOSE 162*  BUN 26*  CREATININE 1.03*  CALCIUM 9.8  MG 2.2  PHOS 3.2   GFR: Estimated Creatinine Clearance: 57.9 mL/min (A) (by C-G formula based on SCr of 1.03 mg/dL (H)). Liver Function Tests: No results for input(s): AST, ALT, ALKPHOS, BILITOT, PROT, ALBUMIN in the last 168 hours. No results for input(s): LIPASE, AMYLASE in the last 168 hours. No results for input(s): AMMONIA in the last 168 hours. Coagulation Profile: No results for input(s): INR, PROTIME in the last 168 hours. Cardiac Enzymes: No results for input(s): CKTOTAL, CKMB, CKMBINDEX, TROPONINI in the last 168 hours. BNP (last 3 results) No results for input(s): PROBNP in the last 8760 hours. HbA1C: No results for input(s): HGBA1C in the last 72 hours. CBG: Recent Labs  Lab 05/04/20 1128 05/04/20 1611 05/04/20 2115 05/05/20 0752 05/05/20 1154  GLUCAP 142* 222* 187* 107* 131*   Lipid Profile: No results for input(s): CHOL, HDL, LDLCALC, TRIG, CHOLHDL, LDLDIRECT in the last 72 hours. Thyroid Function Tests: No results for input(s): TSH, T4TOTAL, FREET4, T3FREE, THYROIDAB in the last 72 hours. Anemia Panel: No results for input(s): VITAMINB12, FOLATE, FERRITIN, TIBC, IRON, RETICCTPCT in the last 72 hours.    Radiology Studies: I have reviewed all of the imaging during this hospital visit personally     Scheduled Meds: . vitamin C  500 mg Oral Daily  . dextromethorphan-guaiFENesin  1 tablet Oral BID  . enoxaparin (LOVENOX) injection  40 mg Subcutaneous Q24H  . feeding supplement ([employer] STANDARD 1.4)  325 mL Oral Daily  . furosemide  20 mg Intravenous BID  . Ipratropium-Albuterol  1 puff Inhalation BID  . linagliptin  5 mg Oral Daily  . mouth rinse  15 mL Mouth Rinse BID  . mometasone-formoterol  2 puff Inhalation BID  . multivitamin with minerals  1 tablet Oral Daily  . nystatin  5 mL Oral  QID  . oxymetazoline  1 spray Each Nare BID  . pantoprazole  40 mg Oral Daily  . [START ON 05/06/2020] predniSONE  20 mg Oral Q breakfast   Followed by  . [START ON 05/08/2020] predniSONE  10 mg Oral Q breakfast   Followed by  . [START ON 05/10/2020] predniSONE  5 mg Oral Q breakfast  . zinc sulfate  220 mg Oral Daily   Continuous Infusions:   LOS: 30 days        Avi Archuleta Gerome Apley, MD

## 2020-05-06 ENCOUNTER — Inpatient Hospital Stay (HOSPITAL_COMMUNITY): Payer: Medicare Other

## 2020-05-06 LAB — GLUCOSE, CAPILLARY
Glucose-Capillary: 159 mg/dL — ABNORMAL HIGH (ref 70–99)
Glucose-Capillary: 147 mg/dL — ABNORMAL HIGH (ref 70–99)
Glucose-Capillary: 226 mg/dL — ABNORMAL HIGH (ref 70–99)
Glucose-Capillary: 200 mg/dL — ABNORMAL HIGH (ref 70–99)

## 2020-05-06 MED ORDER — PREDNISONE 20 MG PO TABS
20.0000 mg | ORAL_TABLET | Freq: Every day | ORAL | Status: DC
  Administered 2020-05-07 – 2020-05-11 (×5): 20 mg via ORAL
  Filled 2020-05-06 (×5): qty 1

## 2020-05-06 MED ORDER — HYDROCOD POLST-CPM POLST ER 10-8 MG/5ML PO SUER
5.0000 mL | Freq: Two times a day (BID) | ORAL | Status: DC
  Administered 2020-05-06 – 2020-05-11 (×10): 5 mL via ORAL
  Filled 2020-05-06 (×10): qty 5

## 2020-05-06 MED ORDER — FUROSEMIDE 40 MG PO TABS
40.0000 mg | ORAL_TABLET | Freq: Every day | ORAL | Status: DC
  Administered 2020-05-06 – 2020-05-09 (×4): 40 mg via ORAL
  Filled 2020-05-06 (×4): qty 1

## 2020-05-06 NOTE — Progress Notes (Signed)
Continues to decline nocturnal CPAP. Order changed to prn

## 2020-05-06 NOTE — Plan of Care (Signed)
Patient up to chair, ambulatory in room and walked in hallway with PT (see PT note).  Patient maintains oxygen saturation in the 90s on 6 liters, drops down to 70's with exertion.

## 2020-05-06 NOTE — Progress Notes (Signed)
Physical Therapy Treatment Patient Details Name: Tiffany Coleman MRN: [id number] DOB: [birth year] Today's Date: 05/06/2020    History of Present Illness 64 y.o. female with a history of morbid obesity, OSA not on CPAP, GAD, GERD, and recent covid-19 diagnosis at PCP office who presented to the ED 8/23 having been found passed out and in respiratory distress at home.  Pt admitted for Acute hypoxemic respiratory failure due to COVID-19 pneumonia    PT Comments    Pt eager and motivated to ambulate.  Pt required at least 8L HFNC and SPO2 dropped to 76% upon returning to room.  Pt continues to require cues for taking rest breaks and pursed lip breathing.    Follow Up Recommendations  Home health PT;Supervision - Intermittent     Equipment Recommendations  Rolling walker with 5" wheels;3in1 (PT)    Recommendations for Other Services       Precautions / Restrictions Precautions Precautions: Fall Precaution Comments: monitor O2 sats and HR, currently on 6L HFNC    Mobility  Bed Mobility               General bed mobility comments: pt up in recliner  Transfers Overall transfer level: Needs assistance Equipment used: Rolling walker (2 wheeled) Transfers: Sit to/from Stand Sit to Stand: Min guard         General transfer comment: min/guard for safety  Ambulation/Gait Ambulation/Gait assistance: Min guard Gait Distance (Feet): 150 Feet Assistive device: Rolling walker (2 wheeled) Gait Pattern/deviations: Step-through pattern;Decreased stride length     General Gait Details: pt ambulated on 6L HFNC however SPO2 dropped to 83% after 60 feet, increased to 8L and SPO2 86-89% however dropped to 76% upon returning to room; HR mostly 120s however elevated to 140s end of ambulation; pt left on 7L HFNC end of session for recovery (RN notified and aware of sats with ambulation)   Stairs             Wheelchair Mobility    Modified Rankin (Stroke Patients  Only)       Balance                                            Cognition Arousal/Alertness: Awake/alert Behavior During Therapy: WFL for tasks assessed/performed Overall Cognitive Status: Within Functional Limits for tasks assessed                                        Exercises      General Comments        Pertinent Vitals/Pain Pain Assessment: No/denies pain    Home Living                      Prior Function            PT Goals (current goals can now be found in the care plan section) Progress towards PT goals: Progressing toward goals    Frequency    Min 3X/week      PT Plan Current plan remains appropriate    Co-evaluation              AM-PAC PT "6 Clicks" Mobility   Outcome Measure  Help needed turning from your back to your side while in a flat bed without using  bedrails?: None Help needed moving from lying on your back to sitting on the side of a flat bed without using bedrails?: A Little Help needed moving to and from a bed to a chair (including a wheelchair)?: A Little Help needed standing up from a chair using your arms (Coleman.g., wheelchair or bedside chair)?: A Little Help needed to walk in hospital room?: A Little Help needed climbing 3-5 steps with a railing? : A Little 6 Click Score: 19    End of Session Equipment Utilized During Treatment: Oxygen Activity Tolerance: Patient tolerated treatment well Patient left: with call bell/phone within reach;in chair Nurse Communication: Mobility status PT Visit Diagnosis: Difficulty in walking, not elsewhere classified (R26.2)     Time: 3672-5500 PT Time Calculation (min) (ACUTE ONLY): 23 min  Charges:  $Gait Training: 8-22 mins                    Arlyce Dice, DPT Acute Rehabilitation Services Pager: [phone] Office: [phone]  Tiffany Coleman 05/06/2020, 3:28 PM

## 2020-05-06 NOTE — Care Management Important Message (Signed)
Important Message  Patient Details IM Letter given to the Patient Name: Tiffany Coleman MRN: [id number] Date of Birth: 09-22-1955   Medicare Important Message Given:  Yes     Kerin Salen 05/06/2020, 10:26 AM

## 2020-05-06 NOTE — Progress Notes (Signed)
PROGRESS NOTE    Tiffany Coleman  EEF:[id number] DOB: February 09, 1956 DOA: 04/05/2020 PCP: Nolene Ebbs, MD    Brief Narrative:  Patient was admitted to the hospital working diagnosis of acute hypoxic respiratory failure due to SARS COVID-19 viral pneumonia.  64 year old female with past medical history for obesity class III, GERD, restrictive sleep apnea.  Patient tested positive for COVID-19 03/28/20, her symptoms were consistent with loss of taste and smell, poor appetite, cough, dyspnea, nausea, vomiting and diarrhea.  As an outpatient patient was treated with prednisone and azithromycin.  At home she had persistent symptoms of hypoxemia.  EMS was called and her oxygen saturation was down to 70s on room air.  On her initial physical examination she was febrile 104 F, tachycardic 118, tachypneic 38 bpm, oxygenation was 78% on 15 L nonrebreather.  Her lungs had rhonchi bilaterally along with rales, heart S1-S2, present, tachycardic, soft abdomen, no lower extremity edema.   Her chest radiograph had diffuse interstitial infiltrates, more prominent in the right upper lobe, but also present on right lower lobe, left upper lobe and left lower lobe.  Patient had a prolonged hospital stay, she has received steroids, MRCP need and remdesivir.  Continue to have high oxygen requirements.    Assessment & Plan:   Principal Problem:   Acute hypoxemic respiratory failure due to COVID-19 [facility]) Active Problems:   Obstructive sleep apnea   Generalized anxiety disorder   Morbid obesity (HCC)   Hypertension   GERD (gastroesophageal reflux disease)    1. Acute hypoxic respiratory failure due to SARS COVID 19 viral pneumonia.  Sp remdesivir 5/5 and baricitinib 14/14.   RR: 20  Pulse oxymetry: 94 %  Fi02: 6 L/ HFNC   Patient continue to have dyspnea with minimal efforts, now using more than 5 L/ min supplemental oxygen.  Chest film personally reviewed with improved infiltrates but  continue to have interstitial changes.   Continue prednisone slow taper, now on 20 mg daily. Resume diuresis for non cardiogenic pulmonary edema with furosemide 40 mg daily to target a negative fluid balance. Continue with bronchodilator therapy.   Plan to discharge home with home health when further improvement in oxygen requirements.  2. Asthma. No clinical signs of acute exacerbation. Continue with dulera.   3. AKI. Patient is tolerating po well.   4. HTN. Stable blood pressure with systolic 883 mmHg.   5, GERD. On pantoprazole with good toleration.   6. Obesity class 2. Calculated BMI is 35,9. Follow up as outpatient     Status is: Inpatient  Remains inpatient appropriate because:Inpatient level of care appropriate due to severity of illness   Dispo:  Patient From: Home  Planned Disposition: Home with Health Care Svc  Expected discharge date: 05/08/20  Medically stable for discharge: No   DVT prophylaxis: Enoxaparin   Code Status:   full  Family Communication:       Nutrition Status: Nutrition Problem: Increased nutrient needs Etiology: acute illness, catabolic illness (GPQDI-26 PNA) Signs/Symptoms: estimated needs Interventions: MVI, Other (Comment) [employer] 1.4 po; Pro[employer] Plus)      Subjective: Patient continue to have dyspnea, worse with exertion, no nausea or vomiting, no chest pain, continue to be very deconditioned.   Objective: Vitals:   05/06/20 0618 05/06/20 0747 05/06/20 0838 05/06/20 0850  BP: 106/69     Pulse: 98     Resp: 20     Temp: 98 F (36.7 C)     TempSrc: Oral  SpO2: 92% 95% (!) 82% 94%  Weight:      Height:        Intake/Output Summary (Last 24 hours) at 05/06/2020 0912 Last data filed at 05/05/2020 1753 Gross per 24 hour  Intake 320 ml  Output 1000 ml  Net -680 ml   Filed Weights   04/12/20 1727 04/21/20 0756 05/04/20 1151  Weight: 95.6 kg 94.1 kg 92.1 kg    Examination:   General: deconditioned and dyspnea  at rest.  Neurology: Awake and alert, non focal  E ENT: mild pallor, no icterus, oral mucosa moist Cardiovascular: No JVD. S1-S2 present, rhythmic, no gallops, rubs, or murmurs. No lower extremity edema. Pulmonary: positive breath sounds bilaterally, no wheezing.  Gastrointestinal. Abdomen protuberant, soft and non tender Skin. No rashes Musculoskeletal: no joint deformities     Data Reviewed: I have personally reviewed following labs and imaging studies  CBC: No results for input(s): WBC, NEUTROABS, HGB, HCT, MCV, PLT in the last 168 hours. Basic Metabolic Panel: Recent Labs  Lab 05/05/20 0435  NA 138  K 4.1  CL 94*  CO2 33*  GLUCOSE 162*  BUN 26*  CREATININE 1.03*  CALCIUM 9.8  MG 2.2  PHOS 3.2   GFR: Estimated Creatinine Clearance: 57.9 mL/min (A) (by C-G formula based on SCr of 1.03 mg/dL (H)). Liver Function Tests: No results for input(s): AST, ALT, ALKPHOS, BILITOT, PROT, ALBUMIN in the last 168 hours. No results for input(s): LIPASE, AMYLASE in the last 168 hours. No results for input(s): AMMONIA in the last 168 hours. Coagulation Profile: No results for input(s): INR, PROTIME in the last 168 hours. Cardiac Enzymes: No results for input(s): CKTOTAL, CKMB, CKMBINDEX, TROPONINI in the last 168 hours. BNP (last 3 results) No results for input(s): PROBNP in the last 8760 hours. HbA1C: No results for input(s): HGBA1C in the last 72 hours. CBG: Recent Labs  Lab 05/05/20 0752 05/05/20 1154 05/05/20 1640 05/05/20 2110 05/06/20 0801  GLUCAP 107* 131* 211* 205* 159*   Lipid Profile: No results for input(s): CHOL, HDL, LDLCALC, TRIG, CHOLHDL, LDLDIRECT in the last 72 hours. Thyroid Function Tests: No results for input(s): TSH, T4TOTAL, FREET4, T3FREE, THYROIDAB in the last 72 hours. Anemia Panel: No results for input(s): VITAMINB12, FOLATE, FERRITIN, TIBC, IRON, RETICCTPCT in the last 72 hours.    Radiology Studies: I have reviewed all of the imaging  during this hospital visit personally     Scheduled Meds: . vitamin C  500 mg Oral Daily  . dextromethorphan-guaiFENesin  1 tablet Oral BID  . enoxaparin (LOVENOX) injection  40 mg Subcutaneous Q24H  . feeding supplement ([employer] STANDARD 1.4)  325 mL Oral Daily  . Ipratropium-Albuterol  1 puff Inhalation BID  . linagliptin  5 mg Oral Daily  . mouth rinse  15 mL Mouth Rinse BID  . mometasone-formoterol  2 puff Inhalation BID  . multivitamin with minerals  1 tablet Oral Daily  . nystatin  5 mL Oral QID  . oxymetazoline  1 spray Each Nare BID  . pantoprazole  40 mg Oral Daily  . predniSONE  20 mg Oral Q breakfast   Followed by  . [START ON 05/08/2020] predniSONE  10 mg Oral Q breakfast   Followed by  . [START ON 05/10/2020] predniSONE  5 mg Oral Q breakfast  . zinc sulfate  220 mg Oral Daily   Continuous Infusions:   LOS: 31 days        Ridgely Anastacio Gerome Apley, MD

## 2020-05-06 NOTE — Plan of Care (Signed)
  Problem: Coping: °Goal: Psychosocial and spiritual needs will be supported °Outcome: Progressing °  °Problem: Respiratory: °Goal: Will maintain a patent airway °Outcome: Progressing °Goal: Complications related to the disease process, condition or treatment will be avoided or minimized °Outcome: Progressing °  °Problem: Health Behavior/Discharge Planning: °Goal: Ability to manage health-related needs will improve °Outcome: Progressing °  °Problem: Clinical Measurements: °Goal: Ability to maintain clinical measurements within normal limits will improve °Outcome: Progressing °Goal: Will remain free from infection °Outcome: Progressing °Goal: Diagnostic test results will improve °Outcome: Progressing °Goal: Respiratory complications will improve °Outcome: Progressing °Goal: Cardiovascular complication will be avoided °Outcome: Progressing °  °Problem: Activity: °Goal: Risk for activity intolerance will decrease °Outcome: Progressing °  °Problem: Coping: °Goal: Level of anxiety will decrease °Outcome: Progressing °  °Problem: Elimination: °Goal: Will not experience complications related to bowel motility °Outcome: Progressing °Goal: Will not experience complications related to urinary retention °Outcome: Progressing °  °Problem: Pain Managment: °Goal: General experience of comfort will improve °Outcome: Progressing °  °Problem: Safety: °Goal: Ability to remain free from injury will improve °Outcome: Progressing °  °Problem: Skin Integrity: °Goal: Risk for impaired skin integrity will decrease °Outcome: Progressing °  °

## 2020-05-07 LAB — BASIC METABOLIC PANEL WITH GFR
Anion gap: 12 (ref 5–15)
BUN: 26 mg/dL — ABNORMAL HIGH (ref 8–23)
CO2: 31 mmol/L (ref 22–32)
Calcium: 10 mg/dL (ref 8.9–10.3)
Chloride: 94 mmol/L — ABNORMAL LOW (ref 98–111)
Creatinine, Ser: 1.25 mg/dL — ABNORMAL HIGH (ref 0.44–1.00)
GFR calc Af Amer: 52 mL/min — ABNORMAL LOW
GFR calc non Af Amer: 45 mL/min — ABNORMAL LOW
Glucose, Bld: 210 mg/dL — ABNORMAL HIGH (ref 70–99)
Potassium: 5.1 mmol/L (ref 3.5–5.1)
Sodium: 137 mmol/L (ref 135–145)

## 2020-05-07 LAB — GLUCOSE, CAPILLARY
Glucose-Capillary: 107 mg/dL — ABNORMAL HIGH (ref 70–99)
Glucose-Capillary: 176 mg/dL — ABNORMAL HIGH (ref 70–99)

## 2020-05-07 MED ORDER — IPRATROPIUM-ALBUTEROL 20-100 MCG/ACT IN AERS
1.0000 | INHALATION_SPRAY | Freq: Four times a day (QID) | RESPIRATORY_TRACT | Status: DC | PRN
  Administered 2020-05-10: 1 via RESPIRATORY_TRACT
  Filled 2020-05-07: qty 4

## 2020-05-07 NOTE — Plan of Care (Signed)
  Problem: Coping: Goal: Psychosocial and spiritual needs will be supported Outcome: Progressing   Problem: Respiratory: Goal: Will maintain a patent airway Outcome: Progressing Goal: Complications related to the disease process, condition or treatment will be avoided or minimized Outcome: Progressing   Problem: Health Behavior/Discharge Planning: Goal: Ability to manage health-related needs will improve Outcome: Progressing   Problem: Clinical Measurements: Goal: Respiratory complications will improve Outcome: Progressing   Problem: Activity: Goal: Risk for activity intolerance will decrease Outcome: Progressing   Problem: Coping: Goal: Level of anxiety will decrease Outcome: Progressing

## 2020-05-07 NOTE — Progress Notes (Signed)
Occupational Therapy Treatment Patient Details Name: Tiffany Coleman MRN: [id number] DOB: [birth year] Today's Date: 05/07/2020    History of present illness 64 y.o. female with a history of morbid obesity, OSA not on CPAP, GAD, GERD, and recent covid-19 diagnosis at PCP office who presented to the ED 8/23 having been found passed out and in respiratory distress at home.  Pt admitted for Acute hypoxemic respiratory failure due to COVID-19 pneumonia   OT comments  Patient seated in recliner on 7 L HFNC when therapist entered the room. Patietn's o2 sat 96% on room air and HR 107. Patient reports her breathing is "weaker" today compared to yesterday. Patient performed toilet transfer and toileting with supervision - using hand hold for transfer. O2 sats dropped to 80-81% but recovered within a couple minutes to above 90% with rest. Patient stood with hand hold only to perform ambulating/side stepping with min assist as patient unsteady without walker. Patient's oxygen dropped to 81% again with approx 1 minute of standing but once again recovered quickly. Attempted ear probe to monitor sats but unable to stay positioned on lobe. Finger probe with good pleth signal reporting o2 sats down to 80%. Therapist encouraged continues use of flutter valve and incentive spirometry. Cont POC   Follow Up Recommendations  Home health OT    Equipment Recommendations  Tub/shower seat    Recommendations for Other Services      Precautions / Restrictions Precautions Precautions: Fall Precaution Comments: monitor O2 sats and HR, currently on 6L HFNC Restrictions Weight Bearing Restrictions: No       Mobility Bed Mobility               General bed mobility comments: pt up in recliner  Transfers Overall transfer level: Needs assistance Equipment used: None Transfers: Sit to/from Stand Sit to Stand: Min guard Stand pivot transfers: Min assist       General transfer comment: Requires min  assist for steadying to take side steps by bed and recliner with only a hand hold.    Balance Overall balance assessment: Needs assistance Sitting-balance support: No upper extremity supported;Feet supported Sitting balance-Leahy Scale: Good     Standing balance support: Single extremity supported Standing balance-Leahy Scale: Fair                             ADL either performed or assessed with clinical judgement   ADL Overall ADL's : Needs assistance/impaired                         Toilet Transfer: Psychologist, counselling Details (indicate cue type and reason): Performed stand pivot to her right from recliner. Required hand holds. Toileting- Clothing Manipulation and Hygiene: Set up;Min guard;Sit to/from stand Toileting - Clothing Manipulation Details (indicate cue type and reason): performed toileting with min guard. required hand hold to steady herself.             Vision Patient Visual Report: No change from baseline     Perception     Praxis      Cognition Arousal/Alertness: Awake/alert Behavior During Therapy: WFL for tasks assessed/performed Overall Cognitive Status: Within Functional Limits for tasks assessed                                          Exercises  Shoulder Instructions       General Comments      Pertinent Vitals/ Pain       Pain Assessment: No/denies pain  Home Living                                          Prior Functioning/Environment              Frequency  Min 2X/week        Progress Toward Goals  OT Goals(current goals can now be found in the care plan section)  Progress towards OT goals: Progressing toward goals  Acute Rehab OT Goals Patient Stated Goal: To get stronger and improve independence OT Goal Formulation: With patient Time For Goal Achievement: 05/12/20 Potential to Achieve Goals: Good  Plan  (o2 sats)     Co-evaluation                 AM-PAC OT "6 Clicks" Daily Activity     Outcome Measure   Help from another person eating meals?: None Help from another person taking care of personal grooming?: A Little Help from another person toileting, which includes using toliet, bedpan, or urinal?: A Little Help from another person bathing (including washing, rinsing, drying)?: A Little Help from another person to put on and taking off regular upper body clothing?: A Little Help from another person to put on and taking off regular lower body clothing?: A Little 6 Click Score: 19    End of Session Equipment Utilized During Treatment: Oxygen  OT Visit Diagnosis: Muscle weakness (generalized) (M62.81)   Activity Tolerance Patient tolerated treatment well   Patient Left in chair;with call bell/phone within reach;with nursing/sitter in room   Nurse Communication Mobility status        Time: 7680-8811 OT Time Calculation (min): 17 min  Charges: OT General Charges $OT Visit: 1 Visit OT Treatments $Self Care/Home Management : 8-22 mins  [name], OTR/L [place]  Office [phone] Pager: [phone]    Lenward Chancellor 05/07/2020, 3:27 PM

## 2020-05-07 NOTE — Progress Notes (Addendum)
PROGRESS NOTE    Tiffany Coleman  [id number]:[id number] DOB: November 18, 1955 DOA: 04/05/2020 PCP: Nolene Ebbs, MD    Brief Narrative:  Patient was admitted to the hospital working diagnosis of acute hypoxic respiratory failure due to SARS COVID-19 viral pneumonia.  64 year old female with past medical history for obesity class III, GERD, restrictive sleep apnea. Patient tested positive for COVID-19 03/28/20,her symptoms were consistent with loss of taste and smell, poor appetite, cough, dyspnea, nausea, vomiting and diarrhea. As an outpatient patient was treated with prednisone and azithromycin. At home she had persistent symptoms of hypoxemia. EMS was called and her oxygen saturation was down to 70s on room air. On her initial physical examination she was febrile 104 F, tachycardic 118, tachypneic 38 bpm, oxygenation was 78% on 15 L nonrebreather. Her lungs had rhonchi bilaterally along with rales, heart S1-S2, present, tachycardic, soft abdomen, no lower extremity edema.  Her chest radiograph had diffuse interstitial infiltrates, more prominent in therightupper lobe, butalso present on right lower lobe, left upper lobeandleft lower lobe.  CT chest on 08/23 with diffuse bilateral infiltrates, no evidence of pulmonary embolism.   Patient had a prolonged hospital stay, she has received steroids,baricitinib and remdesivir. Continue to have high oxygen requirements.   Assessment & Plan:   Principal Problem:   Acute hypoxemic respiratory failure due to COVID-19 [employer]) Active Problems:   Obstructive sleep apnea   Generalized anxiety disorder   Morbid obesity (HCC)   Hypertension   GERD (gastroesophageal reflux disease)    1. Acute hypoxic respiratory failure due to SARS COVID 19 viral pneumonia/ extensive lung injury secondary to viral pneumonia. Sp remdesivir 5/5 and baricitinib 14/14.   RR: 20  Pulse oxymetry:  94 to 97% Fi02: 7 L/ min HFNC  Persistent dyspnea  continue on high oxygen requirements.  Medical therapy with prednisone 20 mg daily and daily furosemide. Continue with airway clearing techniques, bronchodilators and mobility with PT and OT. Patient with extensive lung injury, likely prolonged course of hypoxemic respiratory failure.   2. Asthma. On dulera. No signs of exacerbation.   3. AKI. Resolved. Patient has been diuresed with furosemide, will check on electrolytes.   4. HTN. Controlled.   5, GERD. Continue with pantoprazole, no nausea or vomiting.    6. Obesity class 2. Calculated BMI is 35,9. Follow up as outpatient    Status is: Inpatient  Remains inpatient appropriate because:Inpatient level of care appropriate due to severity of illness   Dispo:  Patient From: Home  Planned Disposition: Home with Health Care Svc  Expected discharge date: 05/09/20  Medically stable for discharge: No   DVT prophylaxis: Enoxaparin   Code Status:   full  Family Communication:  I spoke over the phone with the patient's daughter about patient's  condition, plan of care, prognosis and all questions were addressed.     Nutrition Status: Nutrition Problem: Increased nutrient needs Etiology: acute illness, catabolic illness (EVOJJ-00 PNA) Signs/Symptoms: estimated needs Interventions: MVI, Other (Comment) [employer] 1.4 po; [employer] Plus)       Subjective: Patient continue to have dyspnea, worse with exertion with positive oxygen desaturation on ambulation. Continue to be very weak and deconditioned, this am with anxiety and tremors.   Objective: Vitals:   05/06/20 1225 05/06/20 1924 05/06/20 2033 05/07/20 0626  BP: (!) 106/59  113/71 120/75  Pulse: (!) 102  93 92  Resp: _0 Temp: 98.6 F (37 C)  98.4 F (36.9 C) 98 F (36.7 C)  TempSrc:  Oral Oral  SpO2: 94% 97% 99% 94%  Weight:      Height:        Intake/Output Summary (Last 24 hours) at 05/07/2020 1011 Last data filed at 05/06/2020 2100 Gross per 24  hour  Intake --  Output 400 ml  Net -400 ml   Filed Weights   04/12/20 1727 04/21/20 0756 05/04/20 1151  Weight: 95.6 kg 94.1 kg 92.1 kg    Examination:   General:deconditioned, positive dyspnea at rest. Neurology: Awake and alert, non focal.   E ENT: no pallor, no icterus, oral mucosa moist Cardiovascular: No JVD. S1-S2 present, rhythmic, no gallops, rubs, or murmurs. No lower extremity edema. Pulmonary: positive breath sounds bilaterally, bilateral rales. Gastrointestinal. Abdomen soft and non tender Skin. No rashes Musculoskeletal: no joint deformities     Data Reviewed: I have personally reviewed following labs and imaging studies  CBC: No results for input(s): WBC, NEUTROABS, HGB, HCT, MCV, PLT in the last 168 hours. Basic Metabolic Panel: Recent Labs  Lab 05/05/20 0435  NA 138  K 4.1  CL 94*  CO2 33*  GLUCOSE 162*  BUN 26*  CREATININE 1.03*  CALCIUM 9.8  MG 2.2  PHOS 3.2   GFR: Estimated Creatinine Clearance: 57.9 mL/min (A) (by C-G formula based on SCr of 1.03 mg/dL (H)). Liver Function Tests: No results for input(s): AST, ALT, ALKPHOS, BILITOT, PROT, ALBUMIN in the last 168 hours. No results for input(s): LIPASE, AMYLASE in the last 168 hours. No results for input(s): AMMONIA in the last 168 hours. Coagulation Profile: No results for input(s): INR, PROTIME in the last 168 hours. Cardiac Enzymes: No results for input(s): CKTOTAL, CKMB, CKMBINDEX, TROPONINI in the last 168 hours. BNP (last 3 results) No results for input(s): PROBNP in the last 8760 hours. HbA1C: No results for input(s): HGBA1C in the last 72 hours. CBG: Recent Labs  Lab 05/06/20 0801 05/06/20 1203 05/06/20 1703 05/06/20 2030 05/07/20 0759  GLUCAP 159* 147* 226* 200* 107*   Lipid Profile: No results for input(s): CHOL, HDL, LDLCALC, TRIG, CHOLHDL, LDLDIRECT in the last 72 hours. Thyroid Function Tests: No results for input(s): TSH, T4TOTAL, FREET4, T3FREE, THYROIDAB in the  last 72 hours. Anemia Panel: No results for input(s): VITAMINB12, FOLATE, FERRITIN, TIBC, IRON, RETICCTPCT in the last 72 hours.    Radiology Studies: I have reviewed all of the imaging during this hospital visit personally     Scheduled Meds: . vitamin C  500 mg Oral Daily  . chlorpheniramine-HYDROcodone  5 mL Oral Q12H  . enoxaparin (LOVENOX) injection  40 mg Subcutaneous Q24H  . feeding supplement ([employer] STANDARD 1.4)  325 mL Oral Daily  . furosemide  40 mg Oral Daily  . Ipratropium-Albuterol  1 puff Inhalation BID  . mouth rinse  15 mL Mouth Rinse BID  . mometasone-formoterol  2 puff Inhalation BID  . multivitamin with minerals  1 tablet Oral Daily  . nystatin  5 mL Oral QID  . oxymetazoline  1 spray Each Nare BID  . pantoprazole  40 mg Oral Daily  . predniSONE  20 mg Oral Q breakfast  . zinc sulfate  220 mg Oral Daily   Continuous Infusions:   LOS: 32 days        Teghan Philbin Gerome Apley, MD

## 2020-05-08 LAB — BASIC METABOLIC PANEL
Anion gap: 9 (ref 5–15)
BUN: 23 mg/dL (ref 8–23)
CO2: 33 mmol/L — ABNORMAL HIGH (ref 22–32)
Calcium: 9.9 mg/dL (ref 8.9–10.3)
Chloride: 96 mmol/L — ABNORMAL LOW (ref 98–111)
Creatinine, Ser: 0.86 mg/dL (ref 0.44–1.00)
GFR calc Af Amer: >60 mL/min (ref >60)
GFR calc non Af Amer: >60 mL/min (ref >60)
Glucose, Bld: 146 mg/dL — ABNORMAL HIGH (ref 70–99)
Potassium: 4.5 mmol/L (ref 3.5–5.1)
Sodium: 138 mmol/L (ref 135–145)

## 2020-05-08 LAB — GLUCOSE, CAPILLARY: Glucose-Capillary: 111 mg/dL — ABNORMAL HIGH (ref 70–99)

## 2020-05-08 NOTE — Progress Notes (Signed)
PROGRESS NOTE    Mahli Glahn  PFX:[id number] DOB: 1956-08-14 DOA: 04/05/2020 PCP: Nolene Ebbs, MD    Brief Narrative:  Patient was admitted to the hospital working diagnosis of acute hypoxic respiratory failure due to SARS COVID-19 viral pneumonia.  64 year old female with past medical history for obesity class III, GERD, restrictive sleep apnea. Patient tested positive for COVID-19 03/28/20,her symptoms were consistent with loss of taste and smell, poor appetite, cough, dyspnea, nausea, vomiting and diarrhea. As an outpatient patient was treated with prednisone and azithromycin. At home she had persistent symptoms of hypoxemia. EMS was called and her oxygen saturation was down to 70s on room air. On her initial physical examination she was febrile 104 F, tachycardic 118, tachypneic 38 bpm, oxygenation was 78% on 15 L nonrebreather. Her lungs had rhonchi bilaterally along with rales, heart S1-S2, present, tachycardic, soft abdomen, no lower extremity edema.  Her chest radiograph had diffuse interstitial infiltrates, more prominent in therightupper lobe, butalso present on right lower lobe, left upper lobeandleft lower lobe.  CT chest on 08/23 with diffuse bilateral infiltrates, no evidence of pulmonary embolism.   Patient had a prolonged hospital stay, she has received steroids,baricitinib and remdesivir. Continue to have high oxygen requirements.   Assessment & Plan:   Principal Problem:   Acute hypoxemic respiratory failure due to COVID-19 [facility]) Active Problems:   Obstructive sleep apnea   Generalized anxiety disorder   Morbid obesity (HCC)   Hypertension   GERD (gastroesophageal reflux disease)    1. Acute hypoxic respiratory failure due to SARS COVID 19 viral pneumonia/ extensive lung injury secondary to viral pneumonia. Sp remdesivir 5/5 and baricitinib 14/14.  RR: 22 Pulse oxymetry: 96%  Fi02: 7L/min per [place]  Patient continue to  improve dyspnea, out of bed and ambulating with physical therapy.   Decrease supplemental oxygen to 5 L today and continue close oxymetry monitoring, keep 02 saturation more than 88% at rest.   On prednisone 20 mg daily (slow taper), continue diuresis with furosemide 40 mg to keep negative fluid balance, for non cardiogenic pulmonary edema,  Tentative discharge home with home 02 and home health services on 05/11/20.   2. Asthma. No acute exacerbation. On dulera.   3. AKI. Stable renal function with serum cr today at 0,86 and K at 4,5 with bicarbonate at 33. Continue with furosemide for now.   4. HTN. Blood pressure continue to be well controlled.   5, GERD. Continue with pantoprazole with good toleration.   6. Obesity class 2.  BMI is 35,9.   Status is: Inpatient  Remains inpatient appropriate because:Inpatient level of care appropriate due to severity of illness   Dispo:  Patient From: Home  Planned Disposition: Home with Health Care Svc  Expected discharge date: 05/11/20  Medically stable for discharge: No   DVT prophylaxis: Enoxaparin  Code Status:   full  Family Communication:  No family at the bedside      Nutrition Status: Nutrition Problem: Increased nutrient needs Etiology: acute illness, catabolic illness ([id number]-42 PNA) Signs/Symptoms: estimated needs Interventions: MVI, Other (Comment) [employer] 1.4 po; [employer] Plus)     Subjective: Patient is feeling better, dyspnea has been improving, but not yet back to baseline, has been out of bed to chair and ambulating with physical therapy.   Objective: Vitals:   05/07/20 1831 05/07/20 2103 05/08/20 0516 05/08/20 0852  BP:  118/73 108/65   Pulse:  89 (!) 103   Resp:  _0 Temp:  98 F (36.7 C) 98 F (36.7 C)   TempSrc:  Oral Oral   SpO2: 92% 100% 96%   Weight:      Height:        Intake/Output Summary (Last 24 hours) at 05/08/2020 0853 Last data filed at 05/07/2020 1216 Gross per 24 hour    Intake 160 ml  Output --  Net 160 ml   Filed Weights   04/12/20 1727 04/21/20 0756 05/04/20 1151  Weight: 95.6 kg 94.1 kg 92.1 kg    Examination:   General: Not in pain or dyspnea, deconditioned  Neurology: Awake and alert, non focal  E ENT: no pallor, no icterus, oral mucosa moist Cardiovascular: No JVD. S1-S2 present, rhythmic, no gallops, rubs, or murmurs. No lower extremity edema. Pulmonary: positive breath sounds bilaterally, no rhonchi or rales. Gastrointestinal. Abdomen soft and non tender Skin. No rashes Musculoskeletal: no joint deformities     Data Reviewed: I have personally reviewed following labs and imaging studies  CBC: No results for input(s): WBC, NEUTROABS, HGB, HCT, MCV, PLT in the last 168 hours. Basic Metabolic Panel: Recent Labs  Lab 05/05/20 0435 05/07/20 1430 05/08/20 0401  NA 138 137 138  K 4.1 5.1 4.5  CL 94* 94* 96*  CO2 33* 31 33*  GLUCOSE 162* 210* 146*  BUN 26* 26* 23  CREATININE 1.03* 1.25* 0.86  CALCIUM 9.8 10.0 9.9  MG 2.2  --   --   PHOS 3.2  --   --    GFR: Estimated Creatinine Clearance: 69.4 mL/min (by C-G formula based on SCr of 0.86 mg/dL). Liver Function Tests: No results for input(s): AST, ALT, ALKPHOS, BILITOT, PROT, ALBUMIN in the last 168 hours. No results for input(s): LIPASE, AMYLASE in the last 168 hours. No results for input(s): AMMONIA in the last 168 hours. Coagulation Profile: No results for input(s): INR, PROTIME in the last 168 hours. Cardiac Enzymes: No results for input(s): CKTOTAL, CKMB, CKMBINDEX, TROPONINI in the last 168 hours. BNP (last 3 results) No results for input(s): PROBNP in the last 8760 hours. HbA1C: No results for input(s): HGBA1C in the last 72 hours. CBG: Recent Labs  Lab 05/06/20 1703 05/06/20 2030 05/07/20 0759 05/07/20 1150 05/08/20 0831  GLUCAP 226* 200* 107* 176* 111*   Lipid Profile: No results for input(s): CHOL, HDL, LDLCALC, TRIG, CHOLHDL, LDLDIRECT in the last 72  hours. Thyroid Function Tests: No results for input(s): TSH, T4TOTAL, FREET4, T3FREE, THYROIDAB in the last 72 hours. Anemia Panel: No results for input(s): VITAMINB12, FOLATE, FERRITIN, TIBC, IRON, RETICCTPCT in the last 72 hours.    Radiology Studies: I have reviewed all of the imaging during this hospital visit personally     Scheduled Meds: . vitamin C  500 mg Oral Daily  . chlorpheniramine-HYDROcodone  5 mL Oral Q12H  . enoxaparin (LOVENOX) injection  40 mg Subcutaneous Q24H  . feeding supplement ([employer] STANDARD 1.4)  325 mL Oral Daily  . furosemide  40 mg Oral Daily  . mouth rinse  15 mL Mouth Rinse BID  . mometasone-formoterol  2 puff Inhalation BID  . multivitamin with minerals  1 tablet Oral Daily  . oxymetazoline  1 spray Each Nare BID  . pantoprazole  40 mg Oral Daily  . predniSONE  20 mg Oral Q breakfast  . zinc sulfate  220 mg Oral Daily   Continuous Infusions:   LOS: 33 days        Zalea Pete Gerome Apley, MD

## 2020-05-08 NOTE — Plan of Care (Signed)
  Problem: Coping: °Goal: Psychosocial and spiritual needs will be supported °Outcome: Progressing °  °Problem: Respiratory: °Goal: Will maintain a patent airway °Outcome: Progressing °Goal: Complications related to the disease process, condition or treatment will be avoided or minimized °Outcome: Progressing °  °Problem: Health Behavior/Discharge Planning: °Goal: Ability to manage health-related needs will improve °Outcome: Progressing °  °Problem: Clinical Measurements: °Goal: Ability to maintain clinical measurements within normal limits will improve °Outcome: Progressing °Goal: Will remain free from infection °Outcome: Progressing °Goal: Diagnostic test results will improve °Outcome: Progressing °Goal: Respiratory complications will improve °Outcome: Progressing °Goal: Cardiovascular complication will be avoided °Outcome: Progressing °  °Problem: Activity: °Goal: Risk for activity intolerance will decrease °Outcome: Progressing °  °Problem: Coping: °Goal: Level of anxiety will decrease °Outcome: Progressing °  °Problem: Elimination: °Goal: Will not experience complications related to bowel motility °Outcome: Progressing °Goal: Will not experience complications related to urinary retention °Outcome: Progressing °  °Problem: Pain Managment: °Goal: General experience of comfort will improve °Outcome: Progressing °  °Problem: Safety: °Goal: Ability to remain free from injury will improve °Outcome: Progressing °  °Problem: Skin Integrity: °Goal: Risk for impaired skin integrity will decrease °Outcome: Progressing °  °

## 2020-05-08 NOTE — Progress Notes (Addendum)
Physical Therapy Treatment Patient Details Name: Tiffany Coleman MRN: [id number] DOB: 27-Mar-1956 Today's Date: 05/08/2020    History of Present Illness 64 y.o. female with a history of morbid obesity, OSA not on CPAP, GAD, GERD, and recent covid-19 diagnosis at PCP office who presented to the ED 8/23 having been found passed out and in respiratory distress at home.  Pt admitted for Acute hypoxemic respiratory failure due to COVID-19 pneumonia    PT Comments    O2: 72% on 6L HFNC with ambulation; 90%  end of session at rest.    Follow Up Recommendations  Home health PT;Supervision - Intermittent     Equipment Recommendations  Rolling walker with 5" wheels;3in1 (PT)    Recommendations for Other Services       Precautions / Restrictions Precautions Precautions: Fall Precaution Comments: monitor O2 sats and HR, currently on  HFNC    Mobility  Bed Mobility Overal bed mobility: Modified Independent                Transfers Overall transfer level: Needs assistance Equipment used: Rolling walker (2 wheeled) Transfers: Sit to/from Stand Sit to Stand: Supervision            Ambulation/Gait Ambulation/Gait assistance: Min guard Gait Distance (Feet): 200 Feet Assistive device: Rolling walker (2 wheeled) Gait Pattern/deviations: Step-through pattern;Decreased stride length     General Gait Details: 72% on 6L HFNC, dyspnea 3/4.   Stairs             Wheelchair Mobility    Modified Rankin (Stroke Patients Only)       Balance                                            Cognition Arousal/Alertness: Awake/alert Behavior During Therapy: WFL for tasks assessed/performed Overall Cognitive Status: Within Functional Limits for tasks assessed                                        Exercises      General Comments        Pertinent Vitals/Pain Pain Assessment: No/denies pain    Home Living                       Prior Function            PT Goals (current goals can now be found in the care plan section) Progress towards PT goals: Progressing toward goals    Frequency    Min 3X/week      PT Plan Current plan remains appropriate    Co-evaluation              AM-PAC PT "6 Clicks" Mobility   Outcome Measure  Help needed turning from your back to your side while in a flat bed without using bedrails?: None Help needed moving from lying on your back to sitting on the side of a flat bed without using bedrails?: None Help needed moving to and from a bed to a chair (including a wheelchair)?: A Little Help needed standing up from a chair using your arms (e.g., wheelchair or bedside chair)?: A Little Help needed to walk in hospital room?: A Little Help needed climbing 3-5 steps with a railing? : A Little 6 Click Score:  20    End of Session Equipment Utilized During Treatment: Oxygen Activity Tolerance: Patient tolerated treatment well Patient left: in bed;with call bell/phone within reach;with bed alarm set   PT Visit Diagnosis: Difficulty in walking, not elsewhere classified (R26.2)     Time: 9024-0973 PT Time Calculation (min) (ACUTE ONLY): 15 min  Charges:  $Gait Training: 8-22 mins                         Doreatha Massed, PT Acute Rehabilitation  Office: [phone] Pager: [phone]

## 2020-05-09 LAB — BASIC METABOLIC PANEL
Anion gap: 10 (ref 5–15)
BUN: 26 mg/dL — ABNORMAL HIGH (ref 8–23)
CO2: 34 mmol/L — ABNORMAL HIGH (ref 22–32)
Calcium: 10.2 mg/dL (ref 8.9–10.3)
Chloride: 98 mmol/L (ref 98–111)
Creatinine, Ser: 0.97 mg/dL (ref 0.44–1.00)
GFR calc Af Amer: >60 mL/min (ref >60)
GFR calc non Af Amer: >60 mL/min (ref >60)
Glucose, Bld: 123 mg/dL — ABNORMAL HIGH (ref 70–99)
Potassium: 4.5 mmol/L (ref 3.5–5.1)
Sodium: 142 mmol/L (ref 135–145)

## 2020-05-09 NOTE — Plan of Care (Signed)
Problem: Coping: Goal: Psychosocial and spiritual needs will be supported Outcome: Progressing   Problem: Respiratory: Goal: Will maintain a patent airway Outcome: Progressing Goal: Complications related to the disease process, condition or treatment will be avoided or minimized Outcome: Progressing   Problem: Health Behavior/Discharge Planning: Goal: Ability to manage health-related needs will improve Outcome: Progressing   Problem: Clinical Measurements: Goal: Ability to maintain clinical measurements within normal limits will improve Outcome: Progressing Goal: Will remain free from infection Outcome: Progressing Goal: Diagnostic test results will improve Outcome: Progressing Goal: Respiratory complications will improve Outcome: Progressing Goal: Cardiovascular complication will be avoided Outcome: Progressing   Problem: Activity: Goal: Risk for activity intolerance will decrease Outcome: Progressing   Problem: Coping: Goal: Level of anxiety will decrease Outcome: Progressing   Problem: Elimination: Goal: Will not experience complications related to bowel motility Outcome: Progressing Goal: Will not experience complications related to urinary retention Outcome: Progressing   Problem: Pain Managment: Goal: General experience of comfort will improve Outcome: Progressing   Problem: Safety: Goal: Ability to remain free from injury will improve Outcome: Progressing   Problem: Skin Integrity: Goal: Risk for impaired skin integrity will decrease Outcome: Progressing

## 2020-05-09 NOTE — Progress Notes (Addendum)
Occupational Therapy Treatment Patient Details Name: Tiffany Coleman MRN: [id number] DOB: 1955/12/31 Today's Date: 05/09/2020    History of present illness 64 y.o. female with a history of morbid obesity, OSA not on CPAP, GAD, GERD, and recent covid-19 diagnosis at PCP office who presented to the ED 8/23 having been found passed out and in respiratory distress at home.  Pt admitted for Acute hypoxemic respiratory failure due to COVID-19 pneumonia   OT comments  Patient supine in bed on 5 L HFNC when therapist entered the room. Patient performed toileting with BSC on 5 liters and o2 sat dropped to 69%. After recovery patient ambulated 18 feet on 8L HFNC with o2 sat dropping to 79% and HR 127. Returned to seated position to recover to 90%. Patient ambulated 20 feet on 10L and o2 sat mantained at 93% and HR 123. On third ambulation patient walked approx 200 feet on 10 L with sat drop to 97% and HR up to 134. Returned to seated position to recover back to 90%. Verbal cues provided to work on diaphragmatic breathing and limit talking with ambulation. Patient returned to supine and 5L. Patient reports "My breathing feels better after that walk."   Follow Up Recommendations  Home health OT    Equipment Recommendations  Tub/shower seat    Recommendations for Other Services      Precautions / Restrictions Precautions Precautions: Fall Precaution Comments: monitor O2 sats and HR, currently on  HFNC       Mobility Bed Mobility Overal bed mobility: Modified Independent                Transfers Overall transfer level: Needs assistance Equipment used: Rolling walker (2 wheeled) Transfers: Sit to/from [facility] Sit to Stand: Supervision         General transfer comment: supervision with RW    Balance Overall balance assessment: Mild deficits observed, not formally tested                                         ADL either performed or  assessed with clinical judgement   ADL                           Toilet Transfer: Psychologist, counselling Details (indicate cue type and reason): Stand pivot from bed to bsc Toileting- Clothing Manipulation and Hygiene: Supervision/safety;Sit to/from stand               Vision Patient Visual Report: No change from baseline     Perception     Praxis      Cognition Arousal/Alertness: Awake/alert Behavior During Therapy: WFL for tasks assessed/performed Overall Cognitive Status: Within Functional Limits for tasks assessed                                          Exercises Other Exercises Other Exercises: 3 ambulations with varying levels of oxygen to monitor sat response.   Shoulder Instructions       General Comments      Pertinent Vitals/ Pain       Pain Assessment: No/denies pain  Home Living  Prior Functioning/Environment              Frequency  Min 2X/week        Progress Toward Goals  OT Goals(current goals can now be found in the care plan section)  Progress towards OT goals: Progressing toward goals  Acute Rehab OT Goals Patient Stated Goal: To get stronger and improve independence OT Goal Formulation: With patient Time For Goal Achievement: 05/12/20 Potential to Achieve Goals: Good  Plan Discharge plan remains appropriate    Co-evaluation                 AM-PAC OT "6 Clicks" Daily Activity     Outcome Measure   Help from another person eating meals?: None Help from another person taking care of personal grooming?: A Little Help from another person toileting, which includes using toliet, bedpan, or urinal?: A Little Help from another person bathing (including washing, rinsing, drying)?: A Little Help from another person to put on and taking off regular upper body clothing?: None Help from another person to put on  and taking off regular lower body clothing?: A Little 6 Click Score: 20    End of Session Equipment Utilized During Treatment: Oxygen;Rolling walker  OT Visit Diagnosis: Muscle weakness (generalized) (M62.81)   Activity Tolerance Patient tolerated treatment well   Patient Left with call bell/phone within reach;with nursing/sitter in room;in bed   Nurse Communication Mobility status        Time: 8257-4935 OT Time Calculation (min): 22 min  Charges: OT General Charges $OT Visit: 1 Visit OT Treatments $Therapeutic Activity: 8-22 mins  [name], OTR/L [place]  Office [phone] Pager: [phone]    Lenward Chancellor 05/09/2020, 1:21 PM

## 2020-05-09 NOTE — Plan of Care (Signed)
  Problem: Clinical Measurements: Goal: Diagnostic test results will improve Outcome: Progressing   Problem: Pain Managment: Goal: General experience of comfort will improve Outcome: Progressing   Problem: Skin Integrity: Goal: Risk for impaired skin integrity will decrease Outcome: Progressing   Problem: Clinical Measurements: Goal: Respiratory complications will improve Outcome: Progressing   Problem: Clinical Measurements: Goal: Cardiovascular complication will be avoided Outcome: Progressing

## 2020-05-09 NOTE — Progress Notes (Signed)
PROGRESS NOTE    Tiffany Coleman  [id number]:[id number] DOB: [birth year] DOA: 04/05/2020 PCP: Nolene Ebbs, MD    Brief Narrative:  Patient was admitted to the hospital working diagnosis of acute hypoxic respiratory failure due to SARS COVID-19 viral pneumonia.  64 year old female with past medical history for obesity class III, GERD, restrictive sleep apnea. Patient tested positive for COVID-19 03/28/20,her symptoms were consistent with loss of taste and smell, poor appetite, cough, dyspnea, nausea, vomiting and diarrhea. As an outpatient patient was treated with prednisone and azithromycin. At home she had persistent symptoms of hypoxemia. EMS was called and her oxygen saturation was down to 70s on room air. On her initial physical examination she was febrile 104 F, tachycardic 118, tachypneic 38 bpm, oxygenation was 78% on 15 L nonrebreather. Her lungs had rhonchi bilaterally along with rales, heart S1-S2, present, tachycardic, soft abdomen, no lower extremity edema.  Her chest radiograph had diffuse interstitial infiltrates, more prominent in therightupper lobe, butalso present on right lower lobe, left upper lobeandleft lower lobe.  CT chest on 08/23 with diffuse bilateral infiltrates, no evidence of pulmonary embolism.  Patient had a prolonged hospital stay, she has received steroids,baricitiniband remdesivir. Continue to have high oxygen requirements.   Assessment & Plan:   Principal Problem:   Acute hypoxemic respiratory failure due to COVID-19 [facility]) Active Problems:   Obstructive sleep apnea   Generalized anxiety disorder   Morbid obesity (HCC)   Hypertension   GERD (gastroesophageal reflux disease)   1. Acute hypoxic respiratory failure due to SARS COVID 19 viral pneumonia/ extensive lung injury secondary to viral pneumonia. Sp remdesivir 5/5 and baricitinib 14/14.  RR: 20 Pulse oxymetry: 96%  Fi02: 5L/min per [place]  Patient continue to have  dyspnea and oxygen desaturation on ambulation. Most of the time has been on 5 L/min per [place].   Continue with slow taper of prednisone 20 mg daily. Clinically volume has improved, will discontinue furosemide for now. Continue to plan for a tentative discharge home with home 02 and home health services on 05/11/20.   2. Asthma. No exacerbation. Continue with dulera.   3. AKI. Renal function with serum cr at 0,97 with K at 4,5 and bicarbonate at 34. Hold on furosemide for now.    4. HTN. Blood pressure 116/72 mmHg. Hold on furosemide for now.   5, GERD. On pantoprazole  6. Obesity class 2.  BMI is 35,9. Continue mobility per PT and OT.    Status is: Inpatient  Remains inpatient appropriate because:Inpatient level of care appropriate due to severity of illness   Dispo:  Patient From: Home  Planned Disposition: Home with Health Care Svc  Expected discharge date: 05/11/20  Medically stable for discharge: No    DVT prophylaxis: Enoxaparin   Code Status:   full  Family Communication:  I spoke over the phone with the patient's daughter about patient's  condition, plan of care, prognosis and all questions were addressed.     Nutrition Status: Nutrition Problem: Increased nutrient needs Etiology: acute illness, catabolic illness ([id number] PNA) Signs/Symptoms: estimated needs Interventions: MVI, Other (Comment) [employer] 1.4 po; Prosource Plus)       Subjective: Patient continue to have dyspnea on exertion, no nausea or vomiting, no chest pain. At rest dyspnea has been improving, she is tolerating po well.   Objective: Vitals:   05/08/20 1304 05/08/20 1935 05/08/20 2107 05/09/20 0543  BP: 127/73  134/85 109/79  Pulse: (!) 102  (!) 104 94  Resp: 20  20 20  Temp: 98.2 F (36.8 C)  98.8 F (37.1 C) 98 F (36.7 C)  TempSrc: Oral  Oral Oral  SpO2: 92% 94% 98% 96%  Weight:      Height:        Intake/Output Summary (Last 24 hours) at 05/09/2020 0915 Last data  filed at 05/08/2020 1700 Gross per 24 hour  Intake 1320 ml  Output --  Net 1320 ml   Filed Weights   04/12/20 1727 04/21/20 0756 05/04/20 1151  Weight: 95.6 kg 94.1 kg 92.1 kg    Examination:   General: Not in pain. Mild dyspnea at rest. Deconditioned Neurology: Awake and alert, non focal  E ENT: no pallor, no icterus, oral mucosa moist Cardiovascular: No JVD. S1-S2 present, rhythmic, no gallops, rubs, or murmurs. No lower extremity edema. Pulmonary: positive breath sounds bilaterally, no wheezing, or rhonchi, mild rales bilaterally.  Gastrointestinal. Abdomen soft and non tender Skin. No rashes Musculoskeletal: no joint deformities     Data Reviewed: I have personally reviewed following labs and imaging studies  CBC: No results for input(s): WBC, NEUTROABS, HGB, HCT, MCV, PLT in the last 168 hours. Basic Metabolic Panel: Recent Labs  Lab 05/05/20 0435 05/07/20 1430 05/08/20 0401 05/09/20 0434  NA 138 137 138 142  K 4.1 5.1 4.5 4.5  CL 94* 94* 96* 98  CO2 33* 31 33* 34*  GLUCOSE 162* 210* 146* 123*  BUN 26* 26* 23 26*  CREATININE 1.03* 1.25* 0.86 0.97  CALCIUM 9.8 10.0 9.9 10.2  MG 2.2  --   --   --   PHOS 3.2  --   --   --    GFR: Estimated Creatinine Clearance: 61.5 mL/min (by C-G formula based on SCr of 0.97 mg/dL). Liver Function Tests: No results for input(s): AST, ALT, ALKPHOS, BILITOT, PROT, ALBUMIN in the last 168 hours. No results for input(s): LIPASE, AMYLASE in the last 168 hours. No results for input(s): AMMONIA in the last 168 hours. Coagulation Profile: No results for input(s): INR, PROTIME in the last 168 hours. Cardiac Enzymes: No results for input(s): CKTOTAL, CKMB, CKMBINDEX, TROPONINI in the last 168 hours. BNP (last 3 results) No results for input(s): PROBNP in the last 8760 hours. HbA1C: No results for input(s): HGBA1C in the last 72 hours. CBG: Recent Labs  Lab 05/06/20 1703 05/06/20 2030 05/07/20 0759 05/07/20 1150  05/08/20 0831  GLUCAP 226* 200* 107* 176* 111*   Lipid Profile: No results for input(s): CHOL, HDL, LDLCALC, TRIG, CHOLHDL, LDLDIRECT in the last 72 hours. Thyroid Function Tests: No results for input(s): TSH, T4TOTAL, FREET4, T3FREE, THYROIDAB in the last 72 hours. Anemia Panel: No results for input(s): VITAMINB12, FOLATE, FERRITIN, TIBC, IRON, RETICCTPCT in the last 72 hours.    Radiology Studies: I have reviewed all of the imaging during this hospital visit personally     Scheduled Meds: . vitamin C  500 mg Oral Daily  . chlorpheniramine-HYDROcodone  5 mL Oral Q12H  . enoxaparin (LOVENOX) injection  40 mg Subcutaneous Q24H  . feeding supplement ([employer] STANDARD 1.4)  325 mL Oral Daily  . furosemide  40 mg Oral Daily  . mouth rinse  15 mL Mouth Rinse BID  . mometasone-formoterol  2 puff Inhalation BID  . multivitamin with minerals  1 tablet Oral Daily  . oxymetazoline  1 spray Each Nare BID  . pantoprazole  40 mg Oral Daily  . predniSONE  20 mg Oral Q breakfast  . zinc sulfate  220  mg Oral Daily   Continuous Infusions:   LOS: 34 days        Marelly Wehrman Gerome Apley, MD \

## 2020-05-10 NOTE — TOC Progression Note (Signed)
Transition of Care [facility]) - Progression Note    Patient Details  Name: Tiffany Coleman MRN: [id number] Date of Birth: August 01, 1956  Transition of Care [facility]) CM/SW Contact  Purcell Mouton, RN Phone Number: 05/10/2020, 10:55 AM  Clinical Narrative:    Pt discharging home with [facility] and [employer] for O2. Referral called to in house reps.    Expected Discharge Plan: Home/Self Care Barriers to Discharge: Continued Medical Work up  Expected Discharge Plan and Services Expected Discharge Plan: Home/Self Care   Discharge Planning Services: CM Consult   Living arrangements for the past 2 months: Apartment                                       Social Determinants of Health (SDOH) Interventions    Readmission Risk Interventions No flowsheet data found.

## 2020-05-10 NOTE — Progress Notes (Signed)
Physical Therapy Treatment Patient Details Name: Tiffany Coleman MRN: [id number] DOB: 1956/08/13 Today's Date: 05/10/2020    History of Present Illness 64 y.o. female with a history of morbid obesity, OSA not on CPAP, GAD, GERD, and recent covid-19 diagnosis at PCP office who presented to the ED 8/23 having been found passed out and in respiratory distress at home.  Pt admitted for Acute hypoxemic respiratory failure due to COVID-19 pneumonia    PT Comments    O2 70% on 6L [place] with ambulation. 91% 4L [place] at rest.    Follow Up Recommendations  Home health PT;Supervision - Intermittent     Equipment Recommendations  Rolling walker with 5" wheels;3in1 (PT)    Recommendations for Other Services       Precautions / Restrictions Precautions Precautions: Fall Precaution Comments: monitor O2 sats and HR Restrictions Weight Bearing Restrictions: No    Mobility  Bed Mobility Overal bed mobility: Modified Independent                Transfers Overall transfer level: Needs assistance Equipment used: Rolling walker (2 wheeled) Transfers: Sit to/from Stand Sit to Stand: Supervision Stand pivot transfers: Supervision       General transfer comment: stand pivot, bed<>bsc  Ambulation/Gait Ambulation/Gait assistance: Min guard Gait Distance (Feet): 250 Feet Assistive device: Rolling walker (2 wheeled) Gait Pattern/deviations: Step-through pattern;Decreased stride length     General Gait Details: 70% on 6L [place] O2 with ambulation.   Stairs             Wheelchair Mobility    Modified Rankin (Stroke Patients Only)       Balance                                            Cognition Arousal/Alertness: Awake/alert Behavior During Therapy: WFL for tasks assessed/performed Overall Cognitive Status: Within Functional Limits for tasks assessed                                        Exercises      General Comments         Pertinent Vitals/Pain Pain Assessment: No/denies pain    Home Living                      Prior Function            PT Goals (current goals can now be found in the care plan section) Progress towards PT goals: Progressing toward goals    Frequency    Min 3X/week      PT Plan Current plan remains appropriate    Co-evaluation              AM-PAC PT "6 Clicks" Mobility   Outcome Measure  Help needed turning from your back to your side while in a flat bed without using bedrails?: None Help needed moving from lying on your back to sitting on the side of a flat bed without using bedrails?: None Help needed moving to and from a bed to a chair (including a wheelchair)?: A Little Help needed standing up from a chair using your arms (e.g., wheelchair or bedside chair)?: A Little Help needed to walk in hospital room?: A Little Help needed climbing 3-5 steps with a railing? :  A Little 6 Click Score: 20    End of Session Equipment Utilized During Treatment: Oxygen Activity Tolerance:  (dyspneic with activity) Patient left: in chair;with call bell/phone within reach   PT Visit Diagnosis: Difficulty in walking, not elsewhere classified (R26.2)     Time: 1583-0940 PT Time Calculation (min) (ACUTE ONLY): 20 min  Charges:  $Gait Training: 8-22 mins                         Doreatha Massed, PT Acute Rehabilitation  Office: [phone] Pager: [phone]

## 2020-05-10 NOTE — Progress Notes (Signed)
Nutrition Follow-up  DOCUMENTATION CODES:   Obesity unspecified  INTERVENTION:  - continue [employer] 1.4 po once/day.  NUTRITION DIAGNOSIS:   Increased nutrient needs related to acute illness, catabolic illness (COVID-19 PNA) as evidenced by estimated needs. -ongoing despite infectious period being past.   GOAL:   Patient will meet greater than or equal to 90% of their needs -met   MONITOR:   PO intake, Supplement acceptance, Labs, Weight trends  ASSESSMENT:   64 y.o. female with medical history of morbid obesity, OSA not on CPAP, GAD, GERD, and recent COVID-19 diagnosis at PCP office. She presented to the ED 8/23 having been found passed out and in respiratory distress at home. In the ED, she was febrile, tachycardic, tachypneic 78% on 15L [place] with hypoxia. CT angio chest showed no PE but did show extensive bilateral parenchymal infiltrates consistent with COVID-19 PNA. Remdesivir, steroids, and baricitinib started.  Patient continues to eat 100% of all meals. She has been accepting [employer] >90% of the time offered.   She was last weighed on 9/21 and weight had been trending down since admission on 8/23.  Per notes: - acute hypoxic respiratory failure d/t recent COVID-19 infection (this admission) with extensive lung injury - AKI with diuretic currently on hold - expected d/c date of 9/28   Labs reviewed; CBG: 111 mg/dl, BUN: 26 mg/dl. Medications reviewed; 500 mg ascorbic acid/day, 1 tablet multivitamin with minerals/day, 40 mg oral protonix/day, 20 mg deltasone/day, 220 mg zinc sulfate/day.      NUTRITION - FOCUSED PHYSICAL EXAM:  completed; no muscle or fat depletions, mild edema to BLE.   Diet Order:   Diet Order            Diet Carb Modified Fluid consistency: Thin; Room service appropriate? Yes  Diet effective now                 EDUCATION NEEDS:   No education needs have been identified at this time  Skin:  Skin Assessment: Reviewed RN  Assessment  Last BM:  9/26 (type 4 x1 and type 5 x1)  Height:   Ht Readings from Last 1 Encounters:  04/12/20 5' 3" (1.6 m)    Weight:   Wt Readings from Last 1 Encounters:  05/04/20 92.1 kg    Estimated Nutritional Needs:  Kcal:  1880-2075 kcal Protein:  100-110 grams Fluid:  >/= 2 L/day      , MS, RD, LDN, CNSC Inpatient Clinical Dietitian RD pager # available in AMION  After hours/weekend pager # available in AMION  

## 2020-05-10 NOTE — Progress Notes (Signed)
PROGRESS NOTE    Tiffany Coleman  WNU:[id number] DOB: January 23, 1956 DOA: 04/05/2020 PCP: Nolene Ebbs, MD    Brief Narrative:  Patient was admitted to the hospital working diagnosis of acute hypoxic respiratory failure due to SARS COVID-19 viral pneumonia.  64 year old female with past medical history for obesity class III, GERD, restrictive sleep apnea. Patient tested positive for COVID-19 03/28/20,her symptoms were consistent with loss of taste and smell, poor appetite, cough, dyspnea, nausea, vomiting and diarrhea. As an outpatient patient was treated with prednisone and azithromycin. At home she had persistent symptoms of hypoxemia. EMS was called and her oxygen saturation was down to 70s on room air. On her initial physical examination she was febrile 104 F, tachycardic 118, tachypneic 38 bpm, oxygenation was 78% on 15 L nonrebreather. Her lungs had rhonchi bilaterally along with rales, heart S1-S2, present, tachycardic, soft abdomen, no lower extremity edema.  Her chest radiograph had diffuse interstitial infiltrates, more prominent in therightupper lobe, butalso present on right lower lobe, left upper lobeandleft lower lobe.  CT chest on 08/23 with diffuse bilateral infiltrates, no evidence of pulmonary embolism.  Patient had a prolonged hospital stay, she has received steroids,baricitiniband remdesivir. Continue to have high oxygen requirements.  Slowly improving dyspnea and oxygen requirements, now down to 4 to 5 L per min per [place].  Plan for discharge home in am with home health services.    Assessment & Plan:   Principal Problem:   Acute hypoxemic respiratory failure due to COVID-19 [facility]) Active Problems:   Obstructive sleep apnea   Generalized anxiety disorder   Morbid obesity (HCC)   Hypertension   GERD (gastroesophageal reflux disease)   1. Acute hypoxic respiratory failure due to SARS COVID 19 viral pneumonia/ extensive lung injury  secondary to viral pneumonia. Sp remdesivir 5/5 and baricitinib 14/14.  RR:20 Pulse oxymetry:96 to 98% Fi02:4 to 5L/min per [place]  Continue slow recovery of hypoxemia, now down to 4 and 5 L per min per . Patient will need a slow taper of prednisone and outpatient follow up with post [place] clinic with pulmonary. Continue to encourage out of bed to chair and mobility with physical therapy.   2. Asthma. No clinical signs of exacerbation. Ondulera.   3. AKI.Patient tolerating po well, no furosemide has been discontinued.   4. HTN.stable blood pressure   5, GERD.Continue with pantoprazole  6. Obesity class 2. BMI is 35,9.     Status is: Inpatient  Remains inpatient appropriate because:Inpatient level of care appropriate due to severity of illness   Dispo:  Patient From: Home  Planned Disposition: Home with Health Care Svc  Expected discharge date: 05/11/20  Medically stable for discharge: No   DVT prophylaxis: Enoxaparin   Code Status:   full  Family Communication:  I spoke with her daughter yesterday.      Nutrition Status: Nutrition Problem: Increased nutrient needs Etiology: acute illness, catabolic illness (IHKVQ-25 PNA) Signs/Symptoms: estimated needs Interventions: MVI, Other (Comment) [employer] 1.4 po; Prosource Plus)     Subjective: Patient slowly feeling better, but not yet back to her baseline, no nausea or vomiting, continue to have dyspnea on exertion.   Objective: Vitals:   05/10/20 0351 05/10/20 0831 05/10/20 1403 05/10/20 1405  BP: 119/78  123/74   Pulse: 86  (!) 113 (!) 110  Resp: 20  20   Temp: 98.1 F (36.7 C)  97.9 F (36.6 C)   TempSrc: Oral  Oral   SpO2: 97% 95% 98%   Weight:  Height:        Intake/Output Summary (Last 24 hours) at 05/10/2020 1603 Last data filed at 05/09/2020 1750 Gross per 24 hour  Intake 720 ml  Output --  Net 720 ml   Filed Weights   04/12/20 1727 04/21/20 0756 05/04/20 1151  Weight:  95.6 kg 94.1 kg 92.1 kg    Examination:   General: Not in pain, deconditioned and positive dyspnea at rest.  Neurology: Awake and alert, non focal  E ENT: no pallor, no icterus, oral mucosa moist Cardiovascular: No JVD. S1-S2 present, rhythmic, no gallops, rubs, or murmurs. No lower extremity edema. Pulmonary: positive breath sounds bilaterally, with no wheezing,or  rhonchi  Gastrointestinal. Abdomen soft and non tender Skin. No rashes Musculoskeletal: no joint deformities     Data Reviewed: I have personally reviewed following labs and imaging studies  CBC: No results for input(s): WBC, NEUTROABS, HGB, HCT, MCV, PLT in the last 168 hours. Basic Metabolic Panel: Recent Labs  Lab 05/05/20 0435 05/07/20 1430 05/08/20 0401 05/09/20 0434  NA 138 137 138 142  K 4.1 5.1 4.5 4.5  CL 94* 94* 96* 98  CO2 33* 31 33* 34*  GLUCOSE 162* 210* 146* 123*  BUN 26* 26* 23 26*  CREATININE 1.03* 1.25* 0.86 0.97  CALCIUM 9.8 10.0 9.9 10.2  MG 2.2  --   --   --   PHOS 3.2  --   --   --    GFR: Estimated Creatinine Clearance: 61.5 mL/min (by C-G formula based on SCr of 0.97 mg/dL). Liver Function Tests: No results for input(s): AST, ALT, ALKPHOS, BILITOT, PROT, ALBUMIN in the last 168 hours. No results for input(s): LIPASE, AMYLASE in the last 168 hours. No results for input(s): AMMONIA in the last 168 hours. Coagulation Profile: No results for input(s): INR, PROTIME in the last 168 hours. Cardiac Enzymes: No results for input(s): CKTOTAL, CKMB, CKMBINDEX, TROPONINI in the last 168 hours. BNP (last 3 results) No results for input(s): PROBNP in the last 8760 hours. HbA1C: No results for input(s): HGBA1C in the last 72 hours. CBG: Recent Labs  Lab 05/06/20 1703 05/06/20 2030 05/07/20 0759 05/07/20 1150 05/08/20 0831  GLUCAP 226* 200* 107* 176* 111*   Lipid Profile: No results for input(s): CHOL, HDL, LDLCALC, TRIG, CHOLHDL, LDLDIRECT in the last 72 hours. Thyroid Function  Tests: No results for input(s): TSH, T4TOTAL, FREET4, T3FREE, THYROIDAB in the last 72 hours. Anemia Panel: No results for input(s): VITAMINB12, FOLATE, FERRITIN, TIBC, IRON, RETICCTPCT in the last 72 hours.    Radiology Studies: I have reviewed all of the imaging during this hospital visit personally     Scheduled Meds: . vitamin C  500 mg Oral Daily  . chlorpheniramine-HYDROcodone  5 mL Oral Q12H  . enoxaparin (LOVENOX) injection  40 mg Subcutaneous Q24H  . feeding supplement ([employer] STANDARD 1.4)  325 mL Oral Daily  . mouth rinse  15 mL Mouth Rinse BID  . mometasone-formoterol  2 puff Inhalation BID  . multivitamin with minerals  1 tablet Oral Daily  . pantoprazole  40 mg Oral Daily  . predniSONE  20 mg Oral Q breakfast   Continuous Infusions:   LOS: 35 days        Tiffany Travaglini Gerome Apley, MD

## 2020-05-10 NOTE — Plan of Care (Signed)
  Problem: Respiratory: Goal: Will maintain a patent airway Outcome: Progressing   Problem: Activity: Goal: Risk for activity intolerance will decrease Outcome: Progressing   Problem: Coping: Goal: Level of anxiety will decrease Outcome: Progressing

## 2020-05-10 NOTE — Plan of Care (Signed)
  Problem: Coping: Goal: Psychosocial and spiritual needs will be supported Outcome: Progressing   Problem: Respiratory: Goal: Will maintain a patent airway Outcome: Progressing Goal: Complications related to the disease process, condition or treatment will be avoided or minimized Outcome: Progressing   Problem: Health Behavior/Discharge Planning: Goal: Ability to manage health-related needs will improve Outcome: Progressing   Problem: Clinical Measurements: Goal: Ability to maintain clinical measurements within normal limits will improve Outcome: Progressing Goal: Will remain free from infection Outcome: Progressing Goal: Diagnostic test results will improve Outcome: Progressing Goal: Respiratory complications will improve Outcome: Progressing Goal: Cardiovascular complication will be avoided Outcome: Progressing   Problem: Activity: Goal: Risk for activity intolerance will decrease Outcome: Progressing   Problem: Coping: Goal: Level of anxiety will decrease Outcome: Progressing   Problem: Elimination: Goal: Will not experience complications related to bowel motility Outcome: Progressing Goal: Will not experience complications related to urinary retention Outcome: Progressing   Problem: Pain Managment: Goal: General experience of comfort will improve Outcome: Progressing   Problem: Safety: Goal: Ability to remain free from injury will improve Outcome: Progressing   Problem: Skin Integrity: Goal: Risk for impaired skin integrity will decrease Outcome: Progressing

## 2020-05-10 NOTE — Progress Notes (Signed)
Physical Therapy Treatment Patient Details Name: Tiffany Coleman MRN: [id number] DOB: [birth year] Today's Date: 05/10/2020    History of Present Illness 64 y.o. female with a history of morbid obesity, OSA not on CPAP, GAD, GERD, and recent covid-19 diagnosis at PCP office who presented to the ED 8/23 having been found passed out and in respiratory distress at home.  Pt admitted for Acute hypoxemic respiratory failure due to COVID-19 pneumonia    PT Comments    Assisted pt on/off bsc at her request. She politely declined ambulation at this time. She requested PT check back again in a little while.    Follow Up Recommendations  Home health PT;Supervision - Intermittent     Equipment Recommendations  Rolling walker with 5" wheels;3in1 (PT)    Recommendations for Other Services       Precautions / Restrictions Precautions Precautions: Fall Precaution Comments: monitor O2 sats and HR Restrictions Weight Bearing Restrictions: No    Mobility  Bed Mobility Overal bed mobility: Modified Independent                Transfers Overall transfer level: Needs assistance     Sit to Stand: Supervision Stand pivot transfers: Supervision       General transfer comment: stand pivot, bed<>bsc  Ambulation/Gait             General Gait Details: deferred-pt requested PT check back later   Stairs             Wheelchair Mobility    Modified Rankin (Stroke Patients Only)       Balance                                            Cognition Arousal/Alertness: Awake/alert Behavior During Therapy: WFL for tasks assessed/performed Overall Cognitive Status: Within Functional Limits for tasks assessed                                        Exercises      General Comments        Pertinent Vitals/Pain Pain Assessment: No/denies pain    Home Living                      Prior Function            PT  Goals (current goals can now be found in the care plan section) Progress towards PT goals: Progressing toward goals    Frequency    Min 3X/week      PT Plan Current plan remains appropriate    Co-evaluation              AM-PAC PT "6 Clicks" Mobility   Outcome Measure  Help needed turning from your back to your side while in a flat bed without using bedrails?: None Help needed moving from lying on your back to sitting on the side of a flat bed without using bedrails?: None Help needed moving to and from a bed to a chair (including a wheelchair)?: A Little Help needed standing up from a chair using your arms (e.g., wheelchair or bedside chair)?: A Little Help needed to walk in hospital room?: A Little Help needed climbing 3-5 steps with a railing? : A Little 6 Click Score: 20  End of Session Equipment Utilized During Treatment: Oxygen Activity Tolerance:  (dyspneic with activity) Patient left: in bed;with call bell/phone within reach;with bed alarm set   PT Visit Diagnosis: Difficulty in walking, not elsewhere classified (R26.2)     Time: 6943-7005 PT Time Calculation (min) (ACUTE ONLY): 8 min  Charges:  $Gait Training: 8-22 mins                        Doreatha Massed, PT Acute Rehabilitation  Office: [phone] Pager: [phone]

## 2020-05-10 NOTE — Progress Notes (Signed)
Patient up to [facility] for bath, she is increasingly short  of breath and has to stop and take breaks will activity to catch her breath. She continues on 5L Nasal cannula. Bethann Punches RN

## 2020-05-11 MED ORDER — ACETAMINOPHEN 325 MG PO TABS: 650.0000 mg | ORAL_TABLET | Freq: Four times a day (QID) | ORAL | Status: DC | PRN

## 2020-05-11 MED ORDER — PREDNISONE 10 MG PO TABS: ORAL_TABLET | ORAL | 0 refills | Status: DC

## 2020-05-11 MED ORDER — KATE FARMS STANDARD 1.4 PO LIQD: 325.0000 mL | Freq: Every day | ORAL | 0 refills | Status: AC

## 2020-05-11 MED ORDER — GUAIFENESIN-DM 100-10 MG/5ML PO SYRP: 5.0000 mL | ORAL_SOLUTION | Freq: Four times a day (QID) | ORAL | 0 refills | Status: DC | PRN

## 2020-05-11 NOTE — TOC Transition Note (Signed)
Transition of Care [facility]) - CM/SW Discharge Note   Patient Details  Name: Tiffany Coleman MRN: [id number] Date of Birth: 27-Jun-1956  Transition of Care [facility]) CM/SW Contact:  Ross Ludwig, LCSW Phone Number: 05/11/2020, 12:35 PM   Clinical Narrative:     Patient will be going home with home health through [facility].  CSW signing off please reconsult with any other social work needs, home health agency has been notified of planned discharge.  CSW was informed that patient will need EMS transport home due to oxygen.  CSW was also informed that patient's family will not be able to pick up equipment in patient's room until later in the day.  CSW discussed with nurse that patient's equipment can be placed behind the nurse's station or in the conference room.    Final next level of care: [place] Barriers to Discharge: Barriers Resolved   Patient Goals and CMS Choice Patient states their goals for this hospitalization and ongoing recovery are:: To return back home with home health. CMS Medicare.gov Compare Post Acute Care list provided to:: Patient Choice offered to / list presented to : Patient  Discharge Placement                       Discharge Plan and Services   Discharge Planning Services: CM Consult            DME Arranged: 3-N-1, Oxygen DME Agency: [employer] Date DME Agency Contacted: 05/10/20 Time DME Agency Contacted: 1232 Representative spoke with at DME Agency: [place]: PT [place]: [place] Date [place]: 05/10/20 Time [place]: 1234 Representative spoke with at [place]: [place] ([place]) Interventions     Readmission Risk Interventions No flowsheet data found.

## 2020-05-11 NOTE — Plan of Care (Addendum)
Discharge orders given to patient and all questions were answered. Patient O2 tanks, 3in1 BSC and all belongings were gathered and labeled. Family will come pick that up late this evening. Patient is being transported home via [place].

## 2020-05-11 NOTE — Discharge Summary (Signed)
Physician Discharge Summary  Tiffany Coleman YTK:[id number] DOB: 1956/01/13 DOA: 04/05/2020  PCP: Nolene Ebbs, MD  Admit date: 04/05/2020 Discharge date: 05/11/2020  Admitted From: Home  Disposition:  Home   Recommendations for Outpatient Follow-up and new medication changes:  1. Follow up with Dr. Jeanie Cooks in 7 days.  2. Continue slow taper of steroids. 3. Follow up with post COVID pulmonary clinic.   Home Health: yes   Equipment/Devices: home 02, rolling walker and bedside commode.   Discharge Condition: stable  CODE STATUS: full  Diet recommendation: heart healthy   Brief/Interim Summary: Patient was admitted to the hospital working diagnosis of acute hypoxic respiratory failure due to SARS COVID-19 viral pneumonia.  64 year old female with past medical history for obesity class III, GERD, and obstructive sleep apnea. Patient tested positive for COVID-19 03/28/20,her symptoms were consistent with loss of taste and smell, poor appetite, cough, dyspnea, nausea, vomiting and diarrhea. As an outpatient patient was treated with prednisone and azithromycin. At home she had persistent symptoms of hypoxemia. EMS was called and her oxygen saturation was down to the 70s on room air. On her initial physical examination she was febrile 104 F, tachycardic 118, tachypneic 38 bpm, oxygenation was 78% on 15 L nonrebreather. Her lungs had rhonchi bilaterally along with rales, heart S1-S2, present, tachycardic, soft abdomen, no lower extremity edema.  Her chest radiograph had diffuse interstitial infiltrates, more prominent in therightupper lobe, butalso present on right lower lobe, left upper lobeandleft lower lobe.  CT chest on 08/23 with diffuse bilateral infiltrates, no evidence of pulmonary embolism.  Patient had a prolonged hospital stay, she has received steroids,baricitiniband remdesivir. She continue to have high oxygen requirements.  Through the course of her  hospitalization slowly improving dyspnea and oxygen requirements, now down to 4 to 5 L per min per [place].   Patient will need slow taper of steroids, for persistent lung injury due to viral pneumonia.   1.  Acute hypoxic respiratory failure due to SARS COVID-19 viral pneumonia, complicated by extensive lung injury.  Patient slowly has been improving in terms of her symptoms and oxygenation.  She did develop noncardiogenic pulmonary edema and received diuresis with good toleration.  Further work-up with echocardiography showed a preserved RV and LV function.  Currently using 4 to 5 L/min per nasal cannula. Patient will continue steroid therapy at home with prednisone, slow taper.  Patient will benefit for a close follow-up as an outpatient with a pulse, with pulmonary clinic.  Patient will continue antitussive agents and bronchodilators.  2.  Asthma.  No signs of acute exacerbation, continue Dulera.  3.  Acute kidney injury.  During her hospitalization she did develop acute kidney injury, peak creatinine 1.25. At discharge sodium 142, potassium 4.5, chloride 98, bicarb 34, glucose 123, BUN 26, creatinine 0.97.  4.  Hypertension.  Her blood pressure remained stable.  5.  GERD.  Continue pantoprazole.  6.  Obesity class II/ OSA.  Calculated BMI 35.9, follow-up as an outpatient. Continue Cpap as outpatient.    Discharge Diagnoses:  Principal Problem:   Acute hypoxemic respiratory failure due to COVID-19 [facility]) Active Problems:   Obstructive sleep apnea   Generalized anxiety disorder   Morbid obesity ([place])   Hypertension   GERD (gastroesophageal reflux disease)    Discharge Instructions   Allergies as of 05/11/2020      Reactions   Latex Hives, Itching   Penicillins Hives, Itching, Other (See Comments)   Has patient had a PCN reaction causing immediate  rash, facial/tongue/throat swelling, SOB or lightheadedness with hypotension: Yes Has patient had a PCN reaction causing severe rash  involving mucus membranes or skin necrosis: Unk Has patient had a PCN reaction that required hospitalization: Unk Has patient had a PCN reaction occurring within the last 10 years: No If all of the above answers are "NO", then may proceed with Cephalosporin use.   Tape Hives, Itching, Other (See Comments)   Coban wrap, in particular   Codeine Itching      Medication List    STOP taking these medications   Cetirizine HCl 10 MG Caps Commonly known as: ZyrTEC Allergy   diphenhydrAMINE 25 MG tablet Commonly known as: Benadryl   fluticasone 50 MCG/ACT nasal spray Commonly known as: FLONASE   ipratropium 0.06 % nasal spray Commonly known as: Atrovent   omeprazole 20 MG capsule Commonly known as: PRILOSEC     TAKE these medications   acetaminophen 325 MG tablet Commonly known as: TYLENOL Take 2 tablets (650 mg total) by mouth every 6 (six) hours as needed for mild pain or headache (fever >/= 101). What changed:   how much to take  reasons to take this   albuterol 108 (90 Base) MCG/ACT inhaler Commonly known as: VENTOLIN HFA Inhale 2 puffs into the lungs every 6 (six) hours as needed for wheezing or shortness of breath.   albuterol (2.5 MG/3ML) 0.083% nebulizer solution Commonly known as: PROVENTIL Take 3 mLs (2.5 mg total) by nebulization every 6 (six) hours as needed for wheezing or shortness of breath.   budesonide-formoterol 160-4.5 MCG/ACT inhaler Commonly known as: SYMBICORT Inhale 2 puffs into the lungs 2 (two) times daily.   feeding supplement (KATE FARMS STANDARD 1.4) Liqd liquid Take 325 mLs by mouth daily.   gabapentin 300 MG capsule Commonly known as: NEURONTIN Take 300 mg by mouth 3 (three) times daily as needed (nerve pain).   guaiFENesin-dextromethorphan 100-10 MG/5ML syrup Commonly known as: ROBITUSSIN DM Take 5 mLs by mouth every 6 (six) hours as needed for cough.   predniSONE 10 MG tablet Commonly known as: DELTASONE Take 2 tablets daily for  one week, then take 1 tablet daily for one week, then take half tablet daily for one week.   zolpidem 10 MG tablet Commonly known as: AMBIEN Take 10 mg by mouth at bedtime as needed for sleep.            Durable Medical Equipment  (From admission, onward)         Start     Ordered   05/10/20 1054  For home use only DME oxygen  Once       Question Answer Comment  Length of Need 6 Months   Mode or (Route) Nasal cannula   Liters per Minute 5   Frequency Continuous (stationary and portable oxygen unit needed)   Oxygen conserving device Yes   Oxygen delivery system Gas      05/10/20 1053   05/10/20 1042  For home use only DME Bedside commode  Once       Question:  Patient needs a bedside commode to treat with the following condition  Answer:  Fear for personal safety   05/10/20 1042   05/10/20 1041  For home use only DME 4 wheeled rolling walker with seat  Once       Question:  Patient needs a walker to treat with the following condition  Answer:  Fear for personal safety   05/10/20 1042   05/09/20 1308  For home use only DME Shower stool  Once        05/09/20 1307          Follow-up Information    [facility] Follow up.   Specialty: [place] Why: Please call the above number if you have any questions or concerns about home physical therapy. Contact information: [address] [place] [employer] [place] [address] [phone]        [facility] Follow up.   Why: [facility] will follow you for home Oxygen. If you have any problems or concerns call the above nume.  Contact information: [address] [place] [address] [phone]              Allergies  Allergen Reactions  . Latex Hives and Itching  . Penicillins Hives, Itching and Other (See Comments)    Has patient had a PCN reaction causing immediate rash, facial/tongue/throat swelling, SOB or lightheadedness with hypotension: Yes Has patient had a PCN reaction causing  severe rash involving mucus membranes or skin necrosis: Unk Has patient had a PCN reaction that required hospitalization: Unk Has patient had a PCN reaction occurring within the last 10 years: No If all of the above answers are "NO", then may proceed with Cephalosporin use.   . Tape Hives, Itching and Other (See Comments)    Coban wrap, in particular  . Codeine Itching       Procedures/Studies: DG Chest 1 View  Result Date: 05/06/2020 CLINICAL DATA:  COVID.  Syncope. EXAM: CHEST  1 VIEW COMPARISON:  04/22/2020 FINDINGS: Patchy bilateral airspace opacities again noted, stable since prior study. Low lung volumes. Heart is borderline in size. No visible effusions or pneumothorax. IMPRESSION: Extensive patchy bilateral airspace opacities, not significantly changed since prior study. Electronically Signed   By: Rolm Baptise M.D.   On: 05/06/2020 10:04   DG CHEST PORT 1 VIEW  Result Date: 04/22/2020 CLINICAL DATA:  Respiratory failure.  COVID-19 positive EXAM: PORTABLE CHEST 1 VIEW COMPARISON:  April 05, 2020 FINDINGS: There is widespread airspace opacity bilaterally, slightly less than on previous study overall. Heart size and pulmonary vascularity are normal. No adenopathy. No bone lesions. IMPRESSION: There remains widespread airspace opacity bilaterally, somewhat less pronounced than on prior study, particularly in the left upper lobe. Suspect persistent atypical organism multifocal pneumonia. Heart size within normal limits.  No adenopathy appreciable. Electronically Signed   By: Lowella Grip III M.D.   On: 04/22/2020 08:07   ECHOCARDIOGRAM COMPLETE  Result Date: 04/24/2020    ECHOCARDIOGRAM REPORT   Patient Name:   Tiffany Coleman Date of Exam: 04/24/2020 Medical Rec #:  [id number]                Height:       63.0 in Accession #:    [id number]               Weight:       207.5 lb Date of Birth:  05-Jul-1956                BSA:          1.964 m Patient Age:    51 years                  BP:           110/74 mmHg Patient Gender: F  HR:           88 bpm. Exam Location:  Inpatient Procedure: 2D Echo Indications:    75 CHF  History:        Patient has no prior history of Echocardiogram examinations.                 Risk Factors:Current Smoker and Hypertension. Covid +.  Sonographer:    Jannett Celestine RDCS (AE) Referring Phys: [id number] SENDIL K [facility]  Sonographer Comments: Image acquisition challenging due to patient body habitus. IMPRESSIONS  1. Left ventricular ejection fraction, by estimation, is 60 to 65%. The left ventricle has normal function. The left ventricle has no regional wall motion abnormalities. There is moderate concentric left ventricular hypertrophy. Left ventricular diastolic parameters were normal.  2. Right ventricular systolic function is normal. The right ventricular size is normal. Tricuspid regurgitation signal is inadequate for assessing PA pressure.  3. The pericardial effusion is anterior to the right ventricle.  4. The mitral valve is normal in structure. No evidence of mitral valve regurgitation. No evidence of mitral stenosis.  5. The aortic valve is tricuspid. Aortic valve regurgitation is not visualized. Mild aortic valve sclerosis is present, with no evidence of aortic valve stenosis.  6. The inferior vena cava is normal in size with greater than 50% respiratory variability, suggesting right atrial pressure of 3 mmHg. FINDINGS  Left Ventricle: Left ventricular ejection fraction, by estimation, is 60 to 65%. The left ventricle has normal function. The left ventricle has no regional wall motion abnormalities. The left ventricular internal cavity size was normal in size. There is  moderate concentric left ventricular hypertrophy. Left ventricular diastolic parameters were normal. Normal left ventricular filling pressure. Right Ventricle: The right ventricular size is normal. No increase in right ventricular wall thickness. Right ventricular systolic  function is normal. Tricuspid regurgitation signal is inadequate for assessing PA pressure. Left Atrium: Left atrial size was normal in size. Right Atrium: Right atrial size was normal in size. Pericardium: Trivial pericardial effusion is present. The pericardial effusion is anterior to the right ventricle. Mitral Valve: The mitral valve is normal in structure. No evidence of mitral valve regurgitation. No evidence of mitral valve stenosis. Tricuspid Valve: The tricuspid valve is normal in structure. Tricuspid valve regurgitation is not demonstrated. No evidence of tricuspid stenosis. Aortic Valve: The aortic valve is tricuspid. Aortic valve regurgitation is not visualized. Mild aortic valve sclerosis is present, with no evidence of aortic valve stenosis. Pulmonic Valve: The pulmonic valve was normal in structure. Pulmonic valve regurgitation is trivial. No evidence of pulmonic stenosis. Aorta: The aortic root is normal in size and structure. Venous: The inferior vena cava is normal in size with greater than 50% respiratory variability, suggesting right atrial pressure of 3 mmHg. IAS/Shunts: No atrial level shunt detected by color flow Doppler.  LEFT VENTRICLE PLAX 2D LVIDd:         4.00 cm  Diastology LVIDs:         2.70 cm  LV e' lateral:   12.70 cm/s LV PW:         1.40 cm  LV E/e' lateral: 4.5 LV IVS:        1.40 cm LVOT diam:     2.10 cm LV SV:         52 LV SV Index:   26 LVOT Area:     3.46 cm  LEFT ATRIUM         Index LA diam:  3.30 cm 1.68 cm/m  AORTIC VALVE LVOT Vmax:   93.30 cm/s LVOT Vmean:  65.900 cm/s LVOT VTI:    0.150 m  AORTA Ao Root diam: 3.20 cm MITRAL VALVE MV Area (PHT): 2.69 cm    SHUNTS MV Decel Time: 282 msec    Systemic VTI:  0.15 m MV E velocity: 56.60 cm/s  Systemic Diam: 2.10 cm MV A velocity: 66.80 cm/s MV E/A ratio:  0.85 Fransico Him MD Electronically signed by Fransico Him MD Signature Date/Time: 04/24/2020/4:11:40 PM    Final        Subjective: Patient's symptoms have  been improving, improve oxygen requirements, no nausea or vomiting, no chest pain.   Discharge Exam: Vitals:   05/10/20 2201 05/11/20 0511  BP: 122/79 118/79  Pulse: 100 87  Resp: 20 (!) 24  Temp: 98.2 F (36.8 C) 98.2 F (36.8 C)  SpO2: 99% 97%   Vitals:   05/10/20 1405 05/10/20 2014 05/10/20 2201 05/11/20 0511  BP:   122/79 118/79  Pulse: (!) 110  100 87  Resp:   20 (!) 24  Temp:   98.2 F (36.8 C) 98.2 F (36.8 C)  TempSrc:      SpO2:  96% 99% 97%  Weight:      Height:        General: no chest pain.  Neurology: Awake and alert, non focal  E ENT: no pallor, no icterus, oral mucosa moist Cardiovascular: No JVD. S1-S2 present, rhythmic, no gallops, rubs, or murmurs. No lower extremity edema. Pulmonary: positive breath sounds bilaterally,, no wheezing, or rhonchi, scattered rales. Gastrointestinal. Abdomen soft and non tender Skin. No rashes Musculoskeletal: no joint deformities   The results of significant diagnostics from this hospitalization (including imaging, microbiology, ancillary and laboratory) are listed below for reference.     Microbiology: No results found for this or any previous visit (from the past 240 hour(s)).   Labs: BNP (last 3 results) Recent Labs    04/24/20 0531  BNP 638.7*   Basic Metabolic Panel: Recent Labs  Lab 05/05/20 0435 05/07/20 1430 05/08/20 0401 05/09/20 0434  NA 138 137 138 142  K 4.1 5.1 4.5 4.5  CL 94* 94* 96* 98  CO2 33* 31 33* 34*  GLUCOSE 162* 210* 146* 123*  BUN 26* 26* 23 26*  CREATININE 1.03* 1.25* 0.86 0.97  CALCIUM 9.8 10.0 9.9 10.2  MG 2.2  --   --   --   PHOS 3.2  --   --   --    Liver Function Tests: No results for input(s): AST, ALT, ALKPHOS, BILITOT, PROT, ALBUMIN in the last 168 hours. No results for input(s): LIPASE, AMYLASE in the last 168 hours. No results for input(s): AMMONIA in the last 168 hours. CBC: No results for input(s): WBC, NEUTROABS, HGB, HCT, MCV, PLT in the last 168  hours. Cardiac Enzymes: No results for input(s): CKTOTAL, CKMB, CKMBINDEX, TROPONINI in the last 168 hours. BNP: Invalid input(s): POCBNP CBG: Recent Labs  Lab 05/06/20 1703 05/06/20 2030 05/07/20 0759 05/07/20 1150 05/08/20 0831  GLUCAP 226* 200* 107* 176* 111*   D-Dimer No results for input(s): DDIMER in the last 72 hours. Hgb A1c No results for input(s): HGBA1C in the last 72 hours. Lipid Profile No results for input(s): CHOL, HDL, LDLCALC, TRIG, CHOLHDL, LDLDIRECT in the last 72 hours. Thyroid function studies No results for input(s): TSH, T4TOTAL, T3FREE, THYROIDAB in the last 72 hours.  Invalid input(s): FREET3 Anemia work up No results for  input(s): VITAMINB12, FOLATE, FERRITIN, TIBC, IRON, RETICCTPCT in the last 72 hours. Urinalysis    Component Value Date/Time   COLORURINE YELLOW 07/25/2019 1343   APPEARANCEUR CLEAR 07/25/2019 1343   APPEARANCEUR Cloudy (A) 04/25/2019 1132   LABSPEC 1.018 07/25/2019 1343   PHURINE 5.0 07/25/2019 1343   GLUCOSEU NEGATIVE 07/25/2019 1343   HGBUR NEGATIVE 07/25/2019 1343   BILIRUBINUR NEGATIVE 07/25/2019 1343   BILIRUBINUR Negative 04/25/2019 1132   KETONESUR NEGATIVE 07/25/2019 1343   PROTEINUR NEGATIVE 07/25/2019 1343   NITRITE NEGATIVE 07/25/2019 1343   LEUKOCYTESUR NEGATIVE 07/25/2019 1343   Sepsis Labs Invalid input(s): PROCALCITONIN,  WBC,  LACTICIDVEN Microbiology No results found for this or any previous visit (from the past 240 hour(s)).   Time coordinating discharge: 45 minutes  SIGNED:   Tawni Millers, MD  [facility] 05/11/2020, 8:57 AM

## 2020-05-26 ENCOUNTER — Emergency Department (HOSPITAL_COMMUNITY): Payer: Medicare Other

## 2020-05-26 ENCOUNTER — Inpatient Hospital Stay (HOSPITAL_COMMUNITY)
Admission: EM | Admit: 2020-05-26 | Discharge: 2020-05-31 | DRG: 193 | Disposition: A | Discharge status: Skilled Nursing Facility | Payer: Medicare Other | Attending: Internal Medicine | Admitting: Internal Medicine

## 2020-05-26 ENCOUNTER — Encounter (HOSPITAL_COMMUNITY): Payer: Self-pay | Admitting: Student

## 2020-05-26 ENCOUNTER — Other Ambulatory Visit: Payer: Self-pay

## 2020-05-26 DIAGNOSIS — J9601 Acute respiratory failure with hypoxia: Secondary | ICD-10-CM | POA: Diagnosis present

## 2020-05-26 DIAGNOSIS — Z86010 Personal history of colonic polyps: Secondary | ICD-10-CM

## 2020-05-26 DIAGNOSIS — Z800 Family history of malignant neoplasm of digestive organs: Secondary | ICD-10-CM

## 2020-05-26 DIAGNOSIS — J9621 Acute and chronic respiratory failure with hypoxia: Secondary | ICD-10-CM | POA: Diagnosis present

## 2020-05-26 DIAGNOSIS — Z20822 Contact with and (suspected) exposure to covid-19: Secondary | ICD-10-CM | POA: Diagnosis present

## 2020-05-26 DIAGNOSIS — Z6836 Body mass index (BMI) 36.0-36.9, adult: Secondary | ICD-10-CM

## 2020-05-26 DIAGNOSIS — Z7952 Long term (current) use of systemic steroids: Secondary | ICD-10-CM

## 2020-05-26 DIAGNOSIS — R030 Elevated blood-pressure reading, without diagnosis of hypertension: Secondary | ICD-10-CM | POA: Diagnosis present

## 2020-05-26 DIAGNOSIS — D72829 Elevated white blood cell count, unspecified: Secondary | ICD-10-CM | POA: Diagnosis not present

## 2020-05-26 DIAGNOSIS — Z91040 Latex allergy status: Secondary | ICD-10-CM

## 2020-05-26 DIAGNOSIS — J9620 Acute and chronic respiratory failure, unspecified whether with hypoxia or hypercapnia: Secondary | ICD-10-CM

## 2020-05-26 DIAGNOSIS — F17210 Nicotine dependence, cigarettes, uncomplicated: Secondary | ICD-10-CM | POA: Diagnosis present

## 2020-05-26 DIAGNOSIS — J189 Pneumonia, unspecified organism: Secondary | ICD-10-CM | POA: Diagnosis not present

## 2020-05-26 DIAGNOSIS — Z9049 Acquired absence of other specified parts of digestive tract: Secondary | ICD-10-CM

## 2020-05-26 DIAGNOSIS — Z79899 Other long term (current) drug therapy: Secondary | ICD-10-CM

## 2020-05-26 DIAGNOSIS — U099 Post covid-19 condition, unspecified: Secondary | ICD-10-CM | POA: Diagnosis present

## 2020-05-26 DIAGNOSIS — J45909 Unspecified asthma, uncomplicated: Secondary | ICD-10-CM | POA: Diagnosis present

## 2020-05-26 DIAGNOSIS — G4733 Obstructive sleep apnea (adult) (pediatric): Secondary | ICD-10-CM | POA: Diagnosis present

## 2020-05-26 DIAGNOSIS — Z880 Allergy status to penicillin: Secondary | ICD-10-CM

## 2020-05-26 DIAGNOSIS — Z8616 Personal history of COVID-19: Secondary | ICD-10-CM

## 2020-05-26 DIAGNOSIS — Z833 Family history of diabetes mellitus: Secondary | ICD-10-CM

## 2020-05-26 DIAGNOSIS — Y92239 Unspecified place in hospital as the place of occurrence of the external cause: Secondary | ICD-10-CM | POA: Diagnosis not present

## 2020-05-26 DIAGNOSIS — Z83438 Family history of other disorder of lipoprotein metabolism and other lipidemia: Secondary | ICD-10-CM

## 2020-05-26 DIAGNOSIS — Z885 Allergy status to narcotic agent status: Secondary | ICD-10-CM

## 2020-05-26 DIAGNOSIS — Z90711 Acquired absence of uterus with remaining cervical stump: Secondary | ICD-10-CM

## 2020-05-26 DIAGNOSIS — T380X5A Adverse effect of glucocorticoids and synthetic analogues, initial encounter: Secondary | ICD-10-CM | POA: Diagnosis not present

## 2020-05-26 DIAGNOSIS — Z8249 Family history of ischemic heart disease and other diseases of the circulatory system: Secondary | ICD-10-CM

## 2020-05-26 DIAGNOSIS — D649 Anemia, unspecified: Secondary | ICD-10-CM | POA: Diagnosis present

## 2020-05-26 LAB — CBC WITH DIFFERENTIAL/PLATELET
Abs Immature Granulocytes: 0.08 10*3/uL — ABNORMAL HIGH (ref 0.00–0.07)
Basophils Absolute: 0 10*3/uL (ref 0.0–0.1)
Basophils Relative: 0 %
Eosinophils Absolute: 0.5 10*3/uL (ref 0.0–0.5)
Eosinophils Relative: 5 %
HCT: 35.4 % — ABNORMAL LOW (ref 36.0–46.0)
Hemoglobin: 11 g/dL — ABNORMAL LOW (ref 12.0–15.0)
Immature Granulocytes: 1 %
Lymphocytes Relative: 13 %
Lymphs Abs: 1.3 10*3/uL (ref 0.7–4.0)
MCH: 27.9 pg (ref 26.0–34.0)
MCHC: 31.1 g/dL (ref 30.0–36.0)
MCV: 89.8 fL (ref 80.0–100.0)
Monocytes Absolute: 0.9 10*3/uL (ref 0.1–1.0)
Monocytes Relative: 9 %
Neutro Abs: 7.2 10*3/uL (ref 1.7–7.7)
Neutrophils Relative %: 72 %
Platelets: 192 10*3/uL (ref 150–400)
RBC: 3.94 MIL/uL (ref 3.87–5.11)
RDW: 16.5 % — ABNORMAL HIGH (ref 11.5–15.5)
WBC: 10.1 10*3/uL (ref 4.0–10.5)
nRBC: 0 % (ref 0.0–0.2)

## 2020-05-26 LAB — BASIC METABOLIC PANEL
Anion gap: 12 (ref 5–15)
BUN: 11 mg/dL (ref 8–23)
CO2: 30 mmol/L (ref 22–32)
Calcium: 9.9 mg/dL (ref 8.9–10.3)
Chloride: 99 mmol/L (ref 98–111)
Creatinine, Ser: 0.81 mg/dL (ref 0.44–1.00)
GFR, Estimated: >60 mL/min (ref >60)
Glucose, Bld: 119 mg/dL — ABNORMAL HIGH (ref 70–99)
Potassium: 3.9 mmol/L (ref 3.5–5.1)
Sodium: 141 mmol/L (ref 135–145)

## 2020-05-26 LAB — D-DIMER, QUANTITATIVE: D-Dimer, Quant: 1.55 ug/mL-FEU — ABNORMAL HIGH (ref 0.00–0.50)

## 2020-05-26 LAB — BRAIN NATRIURETIC PEPTIDE: B Natriuretic Peptide: 45.7 pg/mL (ref 0.0–100.0)

## 2020-05-26 LAB — TROPONIN I (HIGH SENSITIVITY)
Troponin I (High Sensitivity): 8 ng/L (ref <18)
Troponin I (High Sensitivity): 8 ng/L (ref <18)

## 2020-05-26 MED ORDER — IPRATROPIUM-ALBUTEROL 0.5-2.5 (3) MG/3ML IN SOLN
3.0000 mL | Freq: Once | RESPIRATORY_TRACT | Status: AC
  Administered 2020-05-26: 3 mL via RESPIRATORY_TRACT
  Filled 2020-05-26: qty 3

## 2020-05-26 MED ORDER — IOHEXOL 350 MG/ML SOLN
75.0000 mL | Freq: Once | INTRAVENOUS | Status: AC | PRN
  Administered 2020-05-26: 75 mL via INTRAVENOUS

## 2020-05-26 MED ORDER — ACETAMINOPHEN 325 MG PO TABS: 650.0000 mg | ORAL_TABLET | Freq: Four times a day (QID) | ORAL | Status: DC | PRN

## 2020-05-26 MED ORDER — ACETAMINOPHEN 500 MG PO TABS
1000.0000 mg | ORAL_TABLET | Freq: Once | ORAL | Status: AC
  Administered 2020-05-26: 1000 mg via ORAL
  Filled 2020-05-26: qty 2

## 2020-05-26 NOTE — ED Notes (Signed)
Assumed care of patient at this time, nad noted, sr up x2, bed locked and low, call bell w/I reach.  Will continue to monitor.  Humidifier bottle has been provided for the patient at this time.

## 2020-05-26 NOTE — ED Notes (Signed)
Patient transported to X-ray.

## 2020-05-26 NOTE — ED Notes (Addendum)
Pt is upset because after going to X-ray she was wet because no one plugged in the purewick.  Pt said the X-ray tech came in and just put a sheet under her and left her in a wet brief.  Pt was also upset because the COVID sign is on her door and feels like people will treat her differently if they think she has COVID.  Pt said she does not have COVID and they have yet to test her for it.  Pt said the only way she could have gotten COVID is from the hospital because she was hospitalized and then went home.  Pt was also hungry.  This writer changed the pt's brief, plugged in the [place] and put a chuck under pt.  This Probation officer also told charge about pt's concerns and brought pt a [place] sandwich, crackers, and apple sauce.

## 2020-05-26 NOTE — ED Notes (Signed)
Patient transported to CT. 

## 2020-05-26 NOTE — ED Triage Notes (Signed)
Patient BIB [facility] c/o hypoxia.  Patient's oxygen sats were in the lower 80s on room air, patient placed on 5L [place] with no improvement.  Fire/rescue placed on NRB at 10L with improvement in sats. Patient SOB has been getting worse in the last 2 weeks especially with exertion.  Patient does not use home oxygen.  Had Covid in August.  Vitals were 130/62  96 HR  16 RR  97% NRB at 10L  122 CBG

## 2020-05-26 NOTE — ED Provider Notes (Signed)
[facility] DEPT Provider Note   CSN: [id number] Arrival date & time: 05/26/20  1237     History Chief Complaint  Patient presents with  . Shortness of Breath  . hypoxia  . Back Pain    Tiffany Coleman is a 64 y.o. female.  Presents ER with concern for shortness of breath.  She reports that she had a bad case of COVID-19, discharged home on 5 L nasal cannula.  She reports that she has suffered from chronic cough and shortness of breath since her Covid illness however over the past couple days to couple weeks she has had significant worsening of her shortness of breath.  Today felt like she could not catch her breath and called EMS.  She has significant dyspnea with any exertion.  No associated chest pain.  HPI     Past Medical History:  Diagnosis Date  . Anemia yrs ago  . Arthritis    knees  . Bronchitis    hx  . Complication of anesthesia    trouble breathing when wake up needs breathing tx when wakes up  . Hx of colonic polyps 2015  . Obstructive sleep apnea 06/02/2016   No CPAP used  . Recurrent ventral hernia 09/08/2013  . Umbilical hernia     Patient Active Problem List   Diagnosis Date Noted  . Acute hypoxemic respiratory failure due to COVID-19 ([place]) 04/05/2020  . Elevated BP without diagnosis of hypertension 04/25/2019  . 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 Teeth missing 05/30/2017  . GERD (gastroesophageal reflux disease) 05/30/2017  . Hypertension 03/08/2017  . Epigastric pain 03/06/2017  . Seasonal allergies 01/19/2017  . Morbid obesity ([place]) 01/19/2017  . Generalized anxiety disorder 10/05/2016  . History of colonic polyps 06/03/2016  . Obstructive sleep apnea 06/02/2016  . Recurrent ventral hernia 09/08/2013    Past Surgical History:  Procedure Laterality Date  . ABDOMINAL HYSTERECTOMY     partial  . BREAST SURGERY Bilateral    cyst removed  . COLON SURGERY    . COLONOSCOPY WITH PROPOFOL N/A  05/29/2017   Procedure: COLONOSCOPY WITH PROPOFOL;  Surgeon: Wilford Corner, MD;  Location: [facility];  Service: Endoscopy;  Laterality: N/A;  . FINGER SURGERY     right pinkie x 3  . HERNIA REPAIR    . KNEE SURGERY Right 2017  . PARTIAL COLECTOMY Left ~2004   for obstruction  . right foot surgery  yrs ago  . stomach tumur     [id number] benign     OB History    Gravida  7   Para      Term      Preterm      AB  3   Living  3     SAB  2   TAB  1   Ectopic      Multiple      Live Births  4           Family History  Problem Relation Age of Onset  . Deep vein thrombosis Mother   . Diabetes Mother   . Hypertension Mother   . Varicose Veins Mother   . Cancer Father        colon  . Hypertension Sister   . Hyperlipidemia Brother   . Hypertension Daughter   . Heart attack Son   . Cancer Maternal Uncle     Social History   Tobacco Use  . Smoking status: Current Some Day Smoker  Years: 20.00    Types: Cigarettes  . Smokeless tobacco: Never Used  . Tobacco comment: smokes 2-3 cigs per day   Vaping Use  . Vaping Use: Never used  Substance Use Topics  . Alcohol use: Yes    Comment: Occ  . Drug use: No    Home Medications Prior to Admission medications   Medication Sig Start Date End Date Taking? Authorizing Provider  acetaminophen (TYLENOL) 325 MG tablet Take 2 tablets (650 mg total) by mouth every 6 (six) hours as needed for mild pain or headache (fever >/= 101). 05/11/20  Yes Arrien, Jimmy Picket, MD  albuterol (PROVENTIL HFA;VENTOLIN HFA) 108 (90 Base) MCG/ACT inhaler Inhale 2 puffs into the lungs every 6 (six) hours as needed for wheezing or shortness of breath. 01/19/17  Yes Minus Liberty, MD  gabapentin (NEURONTIN) 300 MG capsule Take 300 mg by mouth 3 (three) times daily as needed (nerve pain).  07/17/19  Yes [provider]  guaiFENesin-dextromethorphan (ROBITUSSIN DM) 100-10 MG/5ML syrup Take 5 mLs by mouth every 6  (six) hours as needed for cough. 05/11/20  Yes Arrien, Jimmy Picket, MD  Nutritional Supplements (FEEDING SUPPLEMENT, KATE FARMS STANDARD 1.4,) LIQD liquid Take 325 mLs by mouth daily. 05/11/20 06/10/20 Yes Arrien, Jimmy Picket, MD  predniSONE (DELTASONE) 10 MG tablet Take 2 tablets daily for one week, then take 1 tablet daily for one week, then take half tablet daily for one week. 05/11/20  Yes Arrien, Jimmy Picket, MD  zolpidem (AMBIEN) 10 MG tablet Take 10 mg by mouth at bedtime as needed for sleep.  07/18/19  Yes [provider]  albuterol (PROVENTIL) (2.5 MG/3ML) 0.083% nebulizer solution Take 3 mLs (2.5 mg total) by nebulization every 6 (six) hours as needed for wheezing or shortness of breath. Patient not taking: Reported on 05/26/2020 02/08/19   Ok Edwards, PA-C  budesonide-formoterol [facility]) 160-4.5 MCG/ACT inhaler Inhale 2 puffs into the lungs 2 (two) times daily. Patient not taking: Reported on 05/26/2020    [provider]    Allergies    Latex, Penicillins, Tape, and Codeine  Review of Systems   Review of Systems  Constitutional: Negative for chills and fever.  HENT: Negative for ear pain and sore throat.   Eyes: Negative for pain and visual disturbance.  Respiratory: Positive for cough and shortness of breath.   Cardiovascular: Negative for chest pain and palpitations.  Gastrointestinal: Negative for abdominal pain and vomiting.  Genitourinary: Negative for dysuria and hematuria.  Musculoskeletal: Negative for arthralgias and back pain.  Skin: Negative for color change and rash.  Neurological: Negative for seizures and syncope.  All other systems reviewed and are negative.   Physical Exam Updated Vital Signs BP (!) 147/88   Pulse 99   Temp 98.1 F (36.7 C) (Oral)   Resp (!) 32   Ht 5' 3.5" (1.613 m)   Wt 95.3 kg   SpO2 96%   BMI 36.62 kg/m   Physical Exam Vitals and nursing note reviewed.  Constitutional:      General: She is not in  acute distress.    Appearance: She is well-developed.  HENT:     Head: Normocephalic and atraumatic.  Eyes:     Conjunctiva/sclera: Conjunctivae normal.  Cardiovascular:     Rate and Rhythm: Regular rhythm. Tachycardia present.     Heart sounds: No murmur heard.   Pulmonary:     Effort: Pulmonary effort is normal. No respiratory distress.     Breath sounds: Normal breath  sounds.     Comments: Somewhat tachypneic, breath sounds clear on 15 L nonrebreather, able to speak in full sentences Abdominal:     Palpations: Abdomen is soft.     Tenderness: There is no abdominal tenderness.  Musculoskeletal:     Cervical back: Neck supple.     Right lower leg: No tenderness. No edema.     Left lower leg: No tenderness. No edema.  Skin:    General: Skin is warm and dry.     Capillary Refill: Capillary refill takes less than 2 seconds.  Neurological:     General: No focal deficit present.     Mental Status: She is alert.  Psychiatric:        Mood and Affect: Mood normal.     ED Results / Procedures / Treatments   Labs (all labs ordered are listed, but only abnormal results are displayed) Labs Reviewed  CBC WITH DIFFERENTIAL/PLATELET - Abnormal; Notable for the following components:      Result Value   Hemoglobin 11.0 (*)    HCT 35.4 (*)    RDW 16.5 (*)    Abs Immature Granulocytes 0.08 (*)    All other components within normal limits  BASIC METABOLIC PANEL - Abnormal; Notable for the following components:   Glucose, Bld 119 (*)    All other components within normal limits  D-DIMER, QUANTITATIVE (NOT AT [facility]) - Abnormal; Notable for the following components:   D-Dimer, Quant 1.55 (*)    All other components within normal limits  BRAIN NATRIURETIC PEPTIDE  TROPONIN I (HIGH SENSITIVITY)  TROPONIN I (HIGH SENSITIVITY)    EKG EKG Interpretation  Date/Time:  Wednesday May 26 2020 13:10:28 EDT Ventricular Rate:  91 PR Interval:    QRS Duration: 81 QT Interval:  343 QTC  Calculation: 422 R Axis:   33 Text Interpretation: Sinus rhythm Confirmed by Madalyn Rob [phone]) on 05/26/2020 2:03:50 PM   Radiology DG Chest 2 View  Result Date: 05/26/2020 CLINICAL DATA:  Hypoxia. EXAM: CHEST - 2 VIEW COMPARISON:  Chest x-ray dated May 06, 2020. FINDINGS: Unchanged mild cardiomegaly. Normal pulmonary vascularity. Patchy bilateral airspace opacities are not significantly changed. No pneumothorax or large pleural effusion. No acute osseous abnormality. IMPRESSION: 1. Unchanged patchy bilateral airspace disease. Electronically Signed   By: Titus Dubin M.D.   On: 05/26/2020 14:40    Procedures Ultrasound ED Echo  Date/Time: 05/26/2020 5:00 PM Performed by: Lucrezia Starch, MD Authorized by: Lucrezia Starch, MD   Procedure details:    Indications: dyspnea     Views: parasternal long axis view, parasternal short axis view and apical 4 chamber view     Images: archived     Limitations:  Body habitus Findings:    Pericardium: no pericardial effusion     LV Function: normal (>50% EF)     RV Diameter: normal   Impression:    Impression: normal     Impression comment:  Difficulty due to body habitus but grossly normal echo with no large pericardial effusion   (including critical care time)  Medications Ordered in ED Medications  ipratropium-albuterol (DUONEB) 0.5-2.5 (3) MG/3ML nebulizer solution 3 mL (3 mLs Nebulization Given 05/26/20 1451)  iohexol (OMNIPAQUE) 350 MG/ML injection 75 mL (75 mLs Intravenous Contrast Given 05/26/20 1636)    ED Course  I have reviewed the triage vital signs and the nursing notes.  Pertinent labs & imaging results that were available during my care of the patient were reviewed by me  and considered in my medical decision making (see chart for details).    MDM Rules/Calculators/A&P                         64 year old lady who presented to the emergency room with concern for shortness of breath.  Patient  initially requiring significant amount of oxygen 15 L nonrebreather however she was able to be titrated down to 5 L nasal cannula.  Suspect related to her recent Covid diagnosis.  Concern for possible complication.  EKG without obvious ischemic change, troponin within normal limits, doubt ACS.  No obvious pericardial effusion on bedside echocardiogram.  D-dimer is elevated, will check PE study.    Signed out to Dr. Billy Fischer pending reassessment and CT imaging.  Final Clinical Impression(s) / ED Diagnoses Final diagnoses:  Acute on chronic respiratory failure, unspecified whether with hypoxia or hypercapnia ([place])  History of COVID-19    Rx / DC Orders ED Discharge Orders    None       Lucrezia Starch, MD 05/26/20 1702

## 2020-05-27 ENCOUNTER — Encounter (HOSPITAL_COMMUNITY): Payer: Self-pay | Admitting: Internal Medicine

## 2020-05-27 DIAGNOSIS — T380X5A Adverse effect of glucocorticoids and synthetic analogues, initial encounter: Secondary | ICD-10-CM | POA: Diagnosis not present

## 2020-05-27 DIAGNOSIS — D72829 Elevated white blood cell count, unspecified: Secondary | ICD-10-CM | POA: Diagnosis not present

## 2020-05-27 DIAGNOSIS — Z885 Allergy status to narcotic agent status: Secondary | ICD-10-CM | POA: Diagnosis not present

## 2020-05-27 DIAGNOSIS — Z8616 Personal history of COVID-19: Secondary | ICD-10-CM

## 2020-05-27 DIAGNOSIS — Z91040 Latex allergy status: Secondary | ICD-10-CM | POA: Diagnosis not present

## 2020-05-27 DIAGNOSIS — D649 Anemia, unspecified: Secondary | ICD-10-CM | POA: Diagnosis present

## 2020-05-27 DIAGNOSIS — Z833 Family history of diabetes mellitus: Secondary | ICD-10-CM | POA: Diagnosis not present

## 2020-05-27 DIAGNOSIS — J9601 Acute respiratory failure with hypoxia: Secondary | ICD-10-CM | POA: Diagnosis present

## 2020-05-27 DIAGNOSIS — Z7952 Long term (current) use of systemic steroids: Secondary | ICD-10-CM | POA: Diagnosis not present

## 2020-05-27 DIAGNOSIS — F17210 Nicotine dependence, cigarettes, uncomplicated: Secondary | ICD-10-CM | POA: Diagnosis present

## 2020-05-27 DIAGNOSIS — Z20822 Contact with and (suspected) exposure to covid-19: Secondary | ICD-10-CM | POA: Diagnosis present

## 2020-05-27 DIAGNOSIS — J45909 Unspecified asthma, uncomplicated: Secondary | ICD-10-CM | POA: Diagnosis present

## 2020-05-27 DIAGNOSIS — R030 Elevated blood-pressure reading, without diagnosis of hypertension: Secondary | ICD-10-CM | POA: Diagnosis present

## 2020-05-27 DIAGNOSIS — Z83438 Family history of other disorder of lipoprotein metabolism and other lipidemia: Secondary | ICD-10-CM | POA: Diagnosis not present

## 2020-05-27 DIAGNOSIS — Z880 Allergy status to penicillin: Secondary | ICD-10-CM | POA: Diagnosis not present

## 2020-05-27 DIAGNOSIS — J189 Pneumonia, unspecified organism: Secondary | ICD-10-CM | POA: Diagnosis present

## 2020-05-27 DIAGNOSIS — Y92239 Unspecified place in hospital as the place of occurrence of the external cause: Secondary | ICD-10-CM | POA: Diagnosis not present

## 2020-05-27 DIAGNOSIS — Z8249 Family history of ischemic heart disease and other diseases of the circulatory system: Secondary | ICD-10-CM | POA: Diagnosis not present

## 2020-05-27 DIAGNOSIS — U099 Post covid-19 condition, unspecified: Secondary | ICD-10-CM | POA: Diagnosis present

## 2020-05-27 DIAGNOSIS — Z9049 Acquired absence of other specified parts of digestive tract: Secondary | ICD-10-CM | POA: Diagnosis not present

## 2020-05-27 DIAGNOSIS — Z86010 Personal history of colonic polyps: Secondary | ICD-10-CM | POA: Diagnosis not present

## 2020-05-27 DIAGNOSIS — Z6836 Body mass index (BMI) 36.0-36.9, adult: Secondary | ICD-10-CM | POA: Diagnosis not present

## 2020-05-27 DIAGNOSIS — Z800 Family history of malignant neoplasm of digestive organs: Secondary | ICD-10-CM | POA: Diagnosis not present

## 2020-05-27 DIAGNOSIS — J9621 Acute and chronic respiratory failure with hypoxia: Secondary | ICD-10-CM | POA: Diagnosis present

## 2020-05-27 DIAGNOSIS — Z90711 Acquired absence of uterus with remaining cervical stump: Secondary | ICD-10-CM | POA: Diagnosis not present

## 2020-05-27 DIAGNOSIS — G4733 Obstructive sleep apnea (adult) (pediatric): Secondary | ICD-10-CM | POA: Diagnosis present

## 2020-05-27 LAB — BASIC METABOLIC PANEL
Anion gap: 8 (ref 5–15)
BUN: 14 mg/dL (ref 8–23)
CO2: 29 mmol/L (ref 22–32)
Calcium: 9.4 mg/dL (ref 8.9–10.3)
Chloride: 102 mmol/L (ref 98–111)
Creatinine, Ser: 0.72 mg/dL (ref 0.44–1.00)
GFR, Estimated: >60 mL/min (ref >60)
Glucose, Bld: 111 mg/dL — ABNORMAL HIGH (ref 70–99)
Potassium: 4.3 mmol/L (ref 3.5–5.1)
Sodium: 139 mmol/L (ref 135–145)

## 2020-05-27 LAB — CBC
HCT: 33.9 % — ABNORMAL LOW (ref 36.0–46.0)
Hemoglobin: 10.4 g/dL — ABNORMAL LOW (ref 12.0–15.0)
MCH: 27.7 pg (ref 26.0–34.0)
MCHC: 30.7 g/dL (ref 30.0–36.0)
MCV: 90.4 fL (ref 80.0–100.0)
Platelets: 198 10*3/uL (ref 150–400)
RBC: 3.75 MIL/uL — ABNORMAL LOW (ref 3.87–5.11)
RDW: 16.4 % — ABNORMAL HIGH (ref 11.5–15.5)
WBC: 8.9 10*3/uL (ref 4.0–10.5)
nRBC: 0 % (ref 0.0–0.2)

## 2020-05-27 LAB — C-REACTIVE PROTEIN: CRP: 3.8 mg/dL — ABNORMAL HIGH (ref <1.0)

## 2020-05-27 LAB — FERRITIN: Ferritin: 164 ng/mL (ref 11–307)

## 2020-05-27 LAB — LACTATE DEHYDROGENASE: LDH: 199 U/L — ABNORMAL HIGH (ref 98–192)

## 2020-05-27 MED ORDER — HYDROCODONE-HOMATROPINE 5-1.5 MG/5ML PO SYRP
5.0000 mL | ORAL_SOLUTION | Freq: Four times a day (QID) | ORAL | Status: DC | PRN
  Administered 2020-05-27: 5 mL via ORAL
  Filled 2020-05-27: qty 5

## 2020-05-27 MED ORDER — SENNA 8.6 MG PO TABS: 1.0000 | ORAL_TABLET | Freq: Every evening | ORAL | Status: DC | PRN

## 2020-05-27 MED ORDER — IPRATROPIUM-ALBUTEROL 0.5-2.5 (3) MG/3ML IN SOLN: 3.0000 mL | Freq: Three times a day (TID) | RESPIRATORY_TRACT | Status: DC

## 2020-05-27 MED ORDER — ALBUTEROL SULFATE (2.5 MG/3ML) 0.083% IN NEBU
2.5000 mg | INHALATION_SOLUTION | Freq: Three times a day (TID) | RESPIRATORY_TRACT | Status: DC
  Filled 2020-05-27: qty 3

## 2020-05-27 MED ORDER — LORATADINE 10 MG PO TABS
10.0000 mg | ORAL_TABLET | Freq: Every day | ORAL | Status: DC
  Administered 2020-05-27 – 2020-05-31 (×5): 10 mg via ORAL
  Filled 2020-05-27 (×4): qty 1

## 2020-05-27 MED ORDER — BUDESONIDE 0.5 MG/2ML IN SUSP
0.5000 mg | Freq: Two times a day (BID) | RESPIRATORY_TRACT | Status: DC
  Administered 2020-05-27 – 2020-05-31 (×8): 0.5 mg via RESPIRATORY_TRACT
  Filled 2020-05-27 (×9): qty 2

## 2020-05-27 MED ORDER — HYDRALAZINE HCL 20 MG/ML IJ SOLN: 10.0000 mg | INTRAMUSCULAR | Status: DC | PRN

## 2020-05-27 MED ORDER — ENOXAPARIN SODIUM 40 MG/0.4ML ~~LOC~~ SOLN
40.0000 mg | SUBCUTANEOUS | Status: DC
  Administered 2020-05-27 – 2020-05-31 (×5): 40 mg via SUBCUTANEOUS
  Filled 2020-05-27 (×5): qty 0.4

## 2020-05-27 MED ORDER — ALBUTEROL SULFATE (2.5 MG/3ML) 0.083% IN NEBU
2.5000 mg | INHALATION_SOLUTION | RESPIRATORY_TRACT | Status: DC
  Administered 2020-05-27: 2.5 mg via RESPIRATORY_TRACT
  Filled 2020-05-27: qty 3

## 2020-05-27 MED ORDER — PANTOPRAZOLE SODIUM 40 MG PO TBEC
40.0000 mg | DELAYED_RELEASE_TABLET | Freq: Every day | ORAL | Status: DC
  Administered 2020-05-28 – 2020-05-31 (×4): 40 mg via ORAL
  Filled 2020-05-27 (×4): qty 1

## 2020-05-27 MED ORDER — ALBUTEROL SULFATE HFA 108 (90 BASE) MCG/ACT IN AERS
2.0000 | INHALATION_SPRAY | Freq: Three times a day (TID) | RESPIRATORY_TRACT | Status: DC
  Administered 2020-05-27: 2 via RESPIRATORY_TRACT
  Filled 2020-05-27: qty 6.7

## 2020-05-27 MED ORDER — FLUTICASONE PROPIONATE 50 MCG/ACT NA SUSP
2.0000 | Freq: Every day | NASAL | Status: DC
  Administered 2020-05-27 – 2020-05-31 (×5): 2 via NASAL
  Filled 2020-05-27: qty 16

## 2020-05-27 MED ORDER — POLYETHYLENE GLYCOL 3350 17 G PO PACK
17.0000 g | PACK | Freq: Every day | ORAL | Status: DC
  Administered 2020-05-27 – 2020-05-30 (×3): 17 g via ORAL
  Filled 2020-05-27 (×5): qty 1

## 2020-05-27 MED ORDER — METHYLPREDNISOLONE SODIUM SUCC 125 MG IJ SOLR
40.0000 mg | Freq: Two times a day (BID) | INTRAMUSCULAR | Status: DC
  Administered 2020-05-27 – 2020-05-28 (×2): 40 mg via INTRAVENOUS
  Filled 2020-05-27 (×2): qty 2

## 2020-05-27 MED ORDER — GUAIFENESIN ER 600 MG PO TB12
1200.0000 mg | ORAL_TABLET | Freq: Two times a day (BID) | ORAL | Status: DC
  Administered 2020-05-27 – 2020-05-30 (×7): 1200 mg via ORAL
  Filled 2020-05-27 (×9): qty 2

## 2020-05-27 MED ORDER — IPRATROPIUM-ALBUTEROL 0.5-2.5 (3) MG/3ML IN SOLN: 3.0000 mL | RESPIRATORY_TRACT | Status: DC | PRN

## 2020-05-27 MED ORDER — IPRATROPIUM-ALBUTEROL 0.5-2.5 (3) MG/3ML IN SOLN
3.0000 mL | Freq: Three times a day (TID) | RESPIRATORY_TRACT | Status: DC
  Administered 2020-05-27 – 2020-05-30 (×8): 3 mL via RESPIRATORY_TRACT
  Filled 2020-05-27 (×9): qty 3

## 2020-05-27 MED ORDER — METHYLPREDNISOLONE SODIUM SUCC 125 MG IJ SOLR
40.0000 mg | Freq: Every day | INTRAMUSCULAR | Status: DC
  Administered 2020-05-27 (×2): 40 mg via INTRAVENOUS
  Filled 2020-05-27 (×2): qty 2

## 2020-05-27 NOTE — TOC Progression Note (Signed)
Transition of Care [facility]) - Progression Note    Patient Details  Name: Tiffany Coleman MRN: [id number] Date of Birth: March 30, 1956  [facility] CM/SW Contact  Purcell Mouton, RN Phone Number: 05/27/2020, 1:56 PM  Clinical Narrative:     [facility] faxed for SNF placement.   Expected Discharge Plan: [place] Barriers to Discharge: No Barriers Identified  Expected Discharge Plan and Services Expected Discharge Plan: [place] arrangements for the past 2 months: Single Family Home                                       Social Determinants of Health (SDOH) Interventions    Readmission Risk Interventions No flowsheet data found.

## 2020-05-27 NOTE — Plan of Care (Signed)

## 2020-05-27 NOTE — Progress Notes (Signed)
SATURATION QUALIFICATIONS: (This note is used to comply with regulatory documentation for home oxygen)  Patient Saturations on Room Air at Rest = 84%  Patient Saturations on Room Air while Ambulating =  %  Patient Saturations on 6 Liters of oxygen while Ambulating = 85-87%  Please briefly explain why patient needs home oxygen: At 6 L on nasal cannula at rest Sats are 95-97% while ambulating Sats are 85-87%. Unable to tolerated lying or walking without supplemental oxygen, Sats will drop below 80%

## 2020-05-27 NOTE — Plan of Care (Signed)
Pt has no s/s of sepsis at this time. Pt ambulated in the hallway.  Problem: Education: Goal: Knowledge of General Education information will improve Description: Including pain rating scale, medication(s)/side effects and non-pharmacologic comfort measures Outcome: Progressing   Problem: Health Behavior/Discharge Planning: Goal: Ability to manage health-related needs will improve Outcome: Progressing   Problem: Clinical Measurements: Goal: Ability to maintain clinical measurements within normal limits will improve Outcome: Progressing Goal: Will remain free from infection Outcome: Progressing Goal: Diagnostic test results will improve Outcome: Progressing Goal: Respiratory complications will improve Outcome: Progressing Goal: Cardiovascular complication will be avoided Outcome: Progressing   Problem: Activity: Goal: Risk for activity intolerance will decrease Outcome: Progressing   Problem: Nutrition: Goal: Adequate nutrition will be maintained Outcome: Progressing   Problem: Coping: Goal: Level of anxiety will decrease Outcome: Progressing   Problem: Elimination: Goal: Will not experience complications related to bowel motility Outcome: Progressing Goal: Will not experience complications related to urinary retention Outcome: Progressing   Problem: Pain Managment: Goal: General experience of comfort will improve Outcome: Progressing   Problem: Safety: Goal: Ability to remain free from injury will improve Outcome: Progressing   Problem: Skin Integrity: Goal: Risk for impaired skin integrity will decrease Outcome: Progressing

## 2020-05-27 NOTE — Consult Note (Signed)
NAME:  Tiffany Coleman, MRN:  [id number], DOB:  11-26-1955, LOS: 0 ADMISSION DATE:  05/26/2020, CONSULTATION DATE:  05/27/2020 REFERRING MD:  Cline Cools MD, CHIEF COMPLAINT: Post COVID-19, hypoxic respiratory failure  Brief History   64 year old with history of asthma, morbid obesity Recent hospitalization for COVID-19 infection with prolonged hospital stay and discharged on May 11, 2020 Admitted back with hypoxic respiratory failure.  No PE on CTA but imaging shows extensive bilateral infiltrates.  Past Medical History    has a past medical history of Anemia (yrs ago), Arthritis, Bronchitis, Complication of anesthesia, colonic polyps (2015), Obstructive sleep apnea (06/02/2016), Recurrent ventral hernia (10/27/4006), and Umbilical hernia.  Significant Hospital Events   10/13- Admit  Consults:    Procedures:    Significant Diagnostic Tests:  CTA 05/26/2020-no PE, diffuse bilateral interstitial infiltrates and groundglass opacities.  Overall improved compared to prior CT. I have reviewed the images personally.  Micro Data:    Antimicrobials:    Interim history/subjective:    Objective   Blood pressure 138/83, pulse (!) 104, temperature 98.1 F (36.7 C), resp. rate 15, height 5' 3.5" (1.613 m), weight 95.3 kg, SpO2 95 %.        Intake/Output Summary (Last 24 hours) at 05/27/2020 1714 Last data filed at 05/27/2020 6761 Gross per 24 hour  Intake --  Output 400 ml  Net -400 ml   Filed Weights   05/26/20 1315  Weight: 95.3 kg    Examination: Blood pressure 138/83, pulse (!) 104, temperature 98.1 F (36.7 C), resp. rate 15, height 5' 3.5" (1.613 m), weight 95.3 kg, SpO2 95 %. Gen:      No acute distress HEENT:  EOMI, sclera anicteric Neck:     No masses; no thyromegaly Lungs:    Clear to auscultation bilaterally; normal respiratory effort CV:         Regular rate and rhythm; no murmurs Abd:      + bowel sounds; soft, non-tender; no palpable  masses, no distension Ext:    No edema; adequate peripheral perfusion Skin:      Warm and dry; no rash Neuro: alert and oriented x 3 Psych: normal mood and affect  Resolved Hospital Problem list     Assessment & Plan:  Acute hypoxic respiratory failure secondary to post Covid pneumonitis No clear evidence of superimposed infection Continue supplemental oxygen Agree with seroids.  She will need a prolonged taper Follow-up in clinic for monitoring for development of post Covid fibrosis.   Best practice:  Per primary team  Labs   CBC: Recent Labs  Lab 05/26/20 1319 05/27/20 0433  WBC 10.1 8.9  NEUTROABS 7.2  --   HGB 11.0* 10.4*  HCT 35.4* 33.9*  MCV 89.8 90.4  PLT 192 950    Basic Metabolic Panel: Recent Labs  Lab 05/26/20 1319 05/27/20 0433  NA 141 139  K 3.9 4.3  CL 99 102  CO2 30 29  GLUCOSE 119* 111*  BUN 11 14  CREATININE 0.81 0.72  CALCIUM 9.9 9.4   GFR: Estimated Creatinine Clearance: 76.8 mL/min (by C-G formula based on SCr of 0.72 mg/dL). Recent Labs  Lab 05/26/20 1319 05/27/20 0433  WBC 10.1 8.9    Liver Function Tests: No results for input(s): AST, ALT, ALKPHOS, BILITOT, PROT, ALBUMIN in the last 168 hours. No results for input(s): LIPASE, AMYLASE in the last 168 hours. No results for input(s): AMMONIA in the last 168 hours.  ABG    Component Value Date/Time  HCO3 26.4 04/05/2020 1443   TCO2 30 10/12/2016 2114   O2SAT 38.5 04/05/2020 1443     Coagulation Profile: No results for input(s): INR, PROTIME in the last 168 hours.  Cardiac Enzymes: No results for input(s): CKTOTAL, CKMB, CKMBINDEX, TROPONINI in the last 168 hours.  HbA1C: Hgb A1c MFr Bld  Date/Time Value Ref Range Status  04/25/2020 06:21 AM 6.7 (H) 4.8 - 5.6 % Final    Comment:    (NOTE) Pre diabetes:          5.7%-6.4%  Diabetes:              >6.4%  Glycemic control for   <7.0% adults with diabetes   04/25/2019 11:31 AM 5.5 4.8 - 5.6 % Final    Comment:              Prediabetes: 5.7 - 6.4          Diabetes: >6.4          Glycemic control for adults with diabetes: <7.0     CBG: No results for input(s): GLUCAP in the last 168 hours.  Review of Systems:   REVIEW OF SYSTEMS:   All negative; except for those that are bolded, which indicate positives.  Constitutional: weight loss, weight gain, night sweats, fevers, chills, fatigue, weakness.  HEENT: headaches, sore throat, sneezing, nasal congestion, post nasal drip, difficulty swallowing, tooth/dental problems, visual complaints, visual changes, ear aches. Neuro: difficulty with speech, weakness, numbness, ataxia. CV:  chest pain, orthopnea, PND, swelling in lower extremities, dizziness, palpitations, syncope.  Resp: cough, hemoptysis, dyspnea, wheezing. GI: heartburn, indigestion, abdominal pain, nausea, vomiting, diarrhea, constipation, change in bowel habits, loss of appetite, hematemesis, melena, hematochezia.  GU: dysuria, change in color of urine, urgency or frequency, flank pain, hematuria. MSK: joint pain or swelling, decreased range of motion. Psych: change in mood or affect, depression, anxiety, suicidal ideations, homicidal ideations. Skin: rash, itching, bruising.  Past Medical History  She,  has a past medical history of Anemia (yrs ago), Arthritis, Bronchitis, Complication of anesthesia, colonic polyps (2015), Obstructive sleep apnea (06/02/2016), Recurrent ventral hernia (4/59/9774), and Umbilical hernia.   Surgical History    Past Surgical History:  Procedure Laterality Date  . ABDOMINAL HYSTERECTOMY     partial  . BREAST SURGERY Bilateral    cyst removed  . COLON SURGERY    . COLONOSCOPY WITH PROPOFOL N/A 05/29/2017   Procedure: COLONOSCOPY WITH PROPOFOL;  Surgeon: Wilford Corner, MD;  Location: [facility];  Service: Endoscopy;  Laterality: N/A;  . FINGER SURGERY     right pinkie x 3  . HERNIA REPAIR    . KNEE SURGERY Right 2017  . PARTIAL COLECTOMY Left ~2004    for obstruction  . right foot surgery  yrs ago  . stomach tumur     [id number] benign     Social History   reports that she has been smoking cigarettes. She has smoked for the past 20.00 years. She has never used smokeless tobacco. She reports current alcohol use. She reports that she does not use drugs.   Family History   Her family history includes Cancer in her father and maternal uncle; Deep vein thrombosis in her mother; Diabetes in her mother; Heart attack in her son; Hyperlipidemia in her brother; Hypertension in her daughter, mother, and sister; Varicose Veins in her mother.   Allergies Allergies  Allergen Reactions  . Latex Hives and Itching  . Penicillins Hives, Itching and Other (See Comments)  Has patient had a PCN reaction causing immediate rash, facial/tongue/throat swelling, SOB or lightheadedness with hypotension: Yes Has patient had a PCN reaction causing severe rash involving mucus membranes or skin necrosis: Unk Has patient had a PCN reaction that required hospitalization: Unk Has patient had a PCN reaction occurring within the last 10 years: No If all of the above answers are "NO", then may proceed with Cephalosporin use.   . Tape Hives, Itching and Other (See Comments)    Coban wrap, in particular  . Codeine Itching     Home Medications  Prior to Admission medications   Medication Sig Start Date End Date Taking? Authorizing Provider  acetaminophen (TYLENOL) 325 MG tablet Take 2 tablets (650 mg total) by mouth every 6 (six) hours as needed for mild pain or headache (fever >/= 101). 05/11/20  Yes Arrien, Jimmy Picket, MD  albuterol (PROVENTIL HFA;VENTOLIN HFA) 108 (90 Base) MCG/ACT inhaler Inhale 2 puffs into the lungs every 6 (six) hours as needed for wheezing or shortness of breath. 01/19/17  Yes Minus Liberty, MD  gabapentin (NEURONTIN) 300 MG capsule Take 300 mg by mouth 3 (three) times daily as needed (nerve pain).  07/17/19  Yes [provider]  guaiFENesin-dextromethorphan (ROBITUSSIN DM) 100-10 MG/5ML syrup Take 5 mLs by mouth every 6 (six) hours as needed for cough. 05/11/20  Yes Arrien, Jimmy Picket, MD  Nutritional Supplements (FEEDING SUPPLEMENT, KATE FARMS STANDARD 1.4,) LIQD liquid Take 325 mLs by mouth daily. 05/11/20 06/10/20 Yes Arrien, Jimmy Picket, MD  predniSONE (DELTASONE) 10 MG tablet Take 2 tablets daily for one week, then take 1 tablet daily for one week, then take half tablet daily for one week. 05/11/20  Yes Arrien, Jimmy Picket, MD  zolpidem (AMBIEN) 10 MG tablet Take 10 mg by mouth at bedtime as needed for sleep.  07/18/19  Yes [provider]  albuterol (PROVENTIL) (2.5 MG/3ML) 0.083% nebulizer solution Take 3 mLs (2.5 mg total) by nebulization every 6 (six) hours as needed for wheezing or shortness of breath. Patient not taking: Reported on 05/26/2020 02/08/19   Ok Edwards, PA-C  budesonide-formoterol [facility]) 160-4.5 MCG/ACT inhaler Inhale 2 puffs into the lungs 2 (two) times daily. Patient not taking: Reported on 05/26/2020    [provider]     Critical care time:    Marshell Garfinkel MD [facility] Please see [email or web address] for pager details.  05/27/2020, 5:20 PM

## 2020-05-27 NOTE — NC FL2 (Signed)
[place] LEVEL OF CARE SCREENING TOOL     IDENTIFICATION  Patient Name: Tiffany Coleman Birthdate: [birth year] Sex: female Admission Date (Current Location): 05/26/2020  [facility] and [place] Number:      Facility and Address:  [facility],  [place], [place]      Provider Number: [id number]  Attending Physician Name and Address:  Eugenie Filler, MD  Relative Name and Phone Number:  Burman Freestone [phone]    Current Level of Care: Hospital Recommended Level of Care: [place] Prior Approval Number:    Date Approved/Denied:   PASRR Number: [id number]  Discharge Plan: SNF    Current Diagnoses: Patient Active Problem List   Diagnosis Date Noted  . Acute respiratory failure with hypoxemia ([place]) 05/27/2020  . Acute respiratory failure with hypoxia ([place]) 05/26/2020  . Acute hypoxemic respiratory failure due to COVID-19 ([place]) 04/05/2020  . Elevated BP without diagnosis of hypertension 04/25/2019  . 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 Teeth missing 05/30/2017  . GERD (gastroesophageal reflux disease) 05/30/2017  . Hypertension 03/08/2017  . Epigastric pain 03/06/2017  . Seasonal allergies 01/19/2017  . Morbid obesity ([place]) 01/19/2017  . Generalized anxiety disorder 10/05/2016  . History of colonic polyps 06/03/2016  . Obstructive sleep apnea 06/02/2016  . Recurrent ventral hernia 09/08/2013    Orientation RESPIRATION BLADDER Height & Weight     Self, Time, Situation, Place  O2 (6L O2 [place]) Continent Weight: 95.3 kg Height:  5' 3.5" (161.3 cm)  BEHAVIORAL SYMPTOMS/MOOD NEUROLOGICAL BOWEL NUTRITION STATUS      Continent Diet (Regular)  AMBULATORY STATUS COMMUNICATION OF NEEDS Skin   Extensive Assist Verbally                         Personal Care Assistance Level of Assistance  Bathing, Feeding, Dressing, Total care Bathing Assistance: Limited assistance Feeding assistance:  Independent Dressing Assistance: Limited assistance Total Care Assistance: Limited assistance   Functional Limitations Info  Sight, Hearing Sight Info: Adequate Hearing Info: Impaired (glasses)      SPECIAL CARE FACTORS FREQUENCY  PT (By licensed PT), OT (By licensed OT)     PT Frequency: x5 week OT Frequency: x5 week            Contractures Contractures Info: Not present    Additional Factors Info  Code Status, Allergies Code Status Info: FULL Allergies Info: Latex, Penicillins, Tape, Codeine           Current Medications (05/27/2020):  This is the current hospital active medication list Current Facility-Administered Medications  Medication Dose Route Frequency Provider Last Rate Last Admin  . acetaminophen (TYLENOL) tablet 650 mg  650 mg Oral Q6H PRN Rise Patience, MD      . albuterol (VENTOLIN HFA) 108 (90 Base) MCG/ACT inhaler 2 puff  2 puff Inhalation TID Eugenie Filler, MD      . enoxaparin (LOVENOX) injection 40 mg  40 mg Subcutaneous Q24H Rise Patience, MD   40 mg at 05/27/20 0910  . hydrALAZINE (APRESOLINE) injection 10 mg  10 mg Intravenous Q4H PRN Rise Patience, MD      . methylPREDNISolone sodium succinate (SOLU-MEDROL) 125 mg/2 mL injection 40 mg  40 mg Intravenous Daily Rise Patience, MD   40 mg at 05/27/20 2536     Discharge Medications: Please see discharge summary for a list of discharge medications.  Relevant Imaging Results:  Relevant Lab Results:  Additional Information [phone]  Purcell Mouton, RN

## 2020-05-27 NOTE — TOC Progression Note (Signed)
Transition of Care [facility]) - Progression Note    Patient Details  Name: Tiffany Coleman MRN: [id number] Date of Birth: 05-27-1956  Transition of Care [facility]) CM/SW Contact  Purcell Mouton, RN Phone Number: 05/27/2020, 11:59 AM  Clinical Narrative:     Spoke with pt who asked to go to SNF for Rehab [facility]. Pt will need PT/OT eval and insurance authorization.   Expected Discharge Plan: [place] Barriers to Discharge: No Barriers Identified  Expected Discharge Plan and Services Expected Discharge Plan: [place] arrangements for the past 2 months: Single Family Home                                       Social Determinants of Health (SDOH) Interventions    Readmission Risk Interventions No flowsheet data found.

## 2020-05-27 NOTE — Progress Notes (Signed)
Pt attempted CPAP with Nasal mask and was unable to tolerate.  Pt does not wish to try FFM at this time.  Pt has requested RT to remove CPAP machine from room.  RT to monitor as needed.

## 2020-05-27 NOTE — Progress Notes (Signed)
PROGRESS NOTE    Tiffany Coleman  ZSW:[id number] DOB: December 04, 1955 DOA: 05/26/2020 PCP: Nolene Ebbs, MD   Chief Complaint  Patient presents with   Shortness of Breath   hypoxia   Back Pain    Brief Narrative:  Patient is a 64 year old female history of asthma not on any medications, recently hospitalized for COVID-19 infection resulting in acute respiratory failure, O2 dependent now, extensive hospital stay and discharged home on May 11, 2020 approximately 3 weeks ago on 5 L oxygen presented back to the ED with persistent shortness of breath since discharge, cough, shortness of breath on minimal exertion, chest tightness.  Patient seen in the ED noted to be hypoxic requiring 6 L oxygen.  CT angiogram chest negative for PE but did show extensive bilateral infiltrates consistent with sequelae of Covid infection.  Patient admitted for acute respiratory failure with recent Covid infection and placed empirically on IV Solu-Medrol as well as albuterol MDI.   Assessment & Plan:   Principal Problem:   Acute respiratory failure with hypoxia (HCC) Active Problems:   Morbid obesity ([place])  1 acute respiratory failure with hypoxia with recent Covid 19 infection Patient presented with shortness of breath on minimal exertion.  Patient recently hospitalized for COVID-19 infection.  CT angiogram chest done negative for PE however showed bilateral infiltrates consistent with sequelae of Covid infection.  Patient noted on presentation to have some mild expiratory wheezing.  Patient placed on IV steroids as well as albuterol MDIs.  Will continue IV steroids, placed on scheduled duo nebs, Pulmicort, Mucinex, Claritin, Flonase, PPI, Hycodan as needed.  Discontinue scheduled albuterol MDI.  Wean O2 to keep sats > 92%.  Incentive spirometry.  Flutter valve.  Check a procalcitonin levels in the morning and if elevated will place on antibiotics.  Consult pulmonary critical care medicine for  further evaluation and management.  Follow.  2.  Elevated blood pressure reading Patient noted not to be on any antihypertensives prior to admission. bp improved.  Continue hydralazine as needed.  3.  Morbid obesity  4.  History of asthma Not on any medications.  See #1.  5.  Anemia Patient with no overt bleeding.  Check an anemia panel.  Follow H&H.  Transfusion threshold hemoglobin < 7.  6.  OSA CPAP nightly.  DVT prophylaxis: Lovenox Code Status: Full Family Communication: Updated patient.  No family at bedside. Disposition:   Status is: Inpatient    Dispo: The patient is from: Home              Anticipated d/c is to: SNF              Anticipated d/c date is: 2 to 3 days.              Patient currently on IV steroids, complains of worsening shortness of breath, not stable for discharge.       Consultants:   Pulmonary pending  Procedures:   CT angiogram chest 05/26/2020  Chest x-ray 05/26/2020  Antimicrobials:  None   Subjective: Patient laying in bed.  Stated has been been up and ambulating to see whether shortness of breath on minimal exertion has improved.  Denies any chest pain.  Still with complaints of a cough.  Patient states she feels she is going to require SNF this time around and should have accepted SNF during last admission.  Objective: Vitals:   05/26/20 2345 05/27/20 0311 05/27/20 0407 05/27/20 0628  BP: (!) 156/91 130/80  (!) 142/99  Pulse: 96 97  84  Resp: _0 Temp: 99.4 F (37.4 C) 99.1 F (37.3 C)  98.1 F (36.7 C)  TempSrc: Oral     SpO2: 100% 100% 99% 95%  Weight:      Height:        Intake/Output Summary (Last 24 hours) at 05/27/2020 1155 Last data filed at 05/27/2020 7673 Gross per 24 hour  Intake --  Output 400 ml  Net -400 ml   Filed Weights   05/26/20 1315  Weight: 95.3 kg    Examination:  General exam: Appears calm and comfortable  Respiratory system: Scattered coarse breath sounds diffusely.  Fair  air movement.  No wheezing noted.  No crackles noted.  Cardiovascular system: S1 & S2 heard, RRR. No JVD, murmurs, rubs, gallops or clicks. No pedal edema. Gastrointestinal system: Abdomen is nondistended, soft and nontender. No organomegaly or masses felt. Normal bowel sounds heard. Central nervous system: Alert and oriented. No focal neurological deficits. Extremities: Symmetric 5 x 5 power. Skin: No rashes, lesions or ulcers Psychiatry: Judgement and insight appear normal. Mood & affect appropriate.     Data Reviewed: I have personally reviewed following labs and imaging studies  CBC: Recent Labs  Lab 05/26/20 1319 05/27/20 0433  WBC 10.1 8.9  NEUTROABS 7.2  --   HGB 11.0* 10.4*  HCT 35.4* 33.9*  MCV 89.8 90.4  PLT 192 419    Basic Metabolic Panel: Recent Labs  Lab 05/26/20 1319 05/27/20 0433  NA 141 139  K 3.9 4.3  CL 99 102  CO2 30 29  GLUCOSE 119* 111*  BUN 11 14  CREATININE 0.81 0.72  CALCIUM 9.9 9.4    GFR: Estimated Creatinine Clearance: 76.8 mL/min (by C-G formula based on SCr of 0.72 mg/dL).  Liver Function Tests: No results for input(s): AST, ALT, ALKPHOS, BILITOT, PROT, ALBUMIN in the last 168 hours.  CBG: No results for input(s): GLUCAP in the last 168 hours.   No results found for this or any previous visit (from the past 240 hour(s)).       Radiology Studies: DG Chest 2 View  Result Date: 05/26/2020 CLINICAL DATA:  Hypoxia. EXAM: CHEST - 2 VIEW COMPARISON:  Chest x-ray dated May 06, 2020. FINDINGS: Unchanged mild cardiomegaly. Normal pulmonary vascularity. Patchy bilateral airspace opacities are not significantly changed. No pneumothorax or large pleural effusion. No acute osseous abnormality. IMPRESSION: 1. Unchanged patchy bilateral airspace disease. Electronically Signed   By: Titus Dubin M.D.   On: 05/26/2020 14:40   CT Angio Chest PE W and/or Wo Contrast  Result Date: 05/26/2020 CLINICAL DATA:  64 year old female with  recent COVID. Concern for pulmonary embolism. EXAM: CT ANGIOGRAPHY CHEST WITH CONTRAST TECHNIQUE: Multidetector CT imaging of the chest was performed using the standard protocol during bolus administration of intravenous contrast. Multiplanar CT image reconstructions and MIPs were obtained to evaluate the vascular anatomy. CONTRAST:  14m OMNIPAQUE IOHEXOL 350 MG/ML SOLN COMPARISON:  Chest CT dated 04/05/2020. FINDINGS: Cardiovascular: Borderline cardiomegaly. No pericardial effusion. The thoracic aorta is unremarkable. The origins of the great vessels of the aortic arch appear patent. Evaluation of the pulmonary arteries is very limited due to respiratory motion artifact. No large or central pulmonary artery embolus identified. Mediastinum/Nodes: Top-normal hilar and mediastinal lymph nodes, likely reactive. The esophagus and the thyroid gland are grossly unremarkable. No mediastinal fluid collection. Lungs/Pleura: Diffuse bilateral interstitial and streaky densities as well as ground-glass airspace opacities primarily involving the lung bases in keeping  with sequela of COVID-19 pneumonia. Overall improved aeration of the lungs compared to prior CT. There is no pleural effusion pneumothorax. The central airways are patent. Upper Abdomen: No acute abnormality. Musculoskeletal: Degenerative changes of the spine. No acute osseous pathology. Review of the MIP images confirms the above findings. IMPRESSION: 1. No CT evidence of central pulmonary artery embolus. 2. Diffuse bilateral interstitial and streaky densities as well as ground-glass airspace opacities primarily involving the lung bases in keeping with sequela of COVID-19 pneumonia. Overall improved aeration of the lungs compared to prior CT. Electronically Signed   By: Anner Crete M.D.   On: 05/26/2020 20:20        Scheduled Meds:  albuterol  2 puff Inhalation TID   enoxaparin (LOVENOX) injection  40 mg Subcutaneous Q24H   methylPREDNISolone  (SOLU-MEDROL) injection  40 mg Intravenous Daily   Continuous Infusions:   LOS: 0 days    Time spent: 35 minutes  No charge.    Irine Seal, MD [facility]   To contact the attending provider between 7A-7P or the covering provider during after hours 7P-7A, please log into the web site [email or web address] and access using universal [place] password for that web site. If you do not have the password, please call the hospital operator.  05/27/2020, 11:55 AM

## 2020-05-27 NOTE — H&P (Addendum)
History and Physical    Tiffany Coleman EZM:[id number] DOB: 01-23-1956 DOA: 05/26/2020  PCP: Nolene Ebbs, MD  Patient coming from: Home.  Chief Complaint: Shortness of breath.  HPI: Tiffany Coleman is a 64 y.o. female with history of asthma presently not on any medications who was recently admitted for COVID-19 infection resulting in respiratory failure and had extensive hospital stay was discharged home on May 11, 2020 about 3 weeks ago on 5 L oxygen presents to the ER because of persistent shortness of breath since discharge.  Patient states she gets short of breath with minimal movement.  Has been having some cough and chest tightness.  Denies any fever chills.  Has not noticed any leg swelling.  ED Course: In the ER patient was hypoxic requiring 6 L oxygen and CT angiogram of the chest was negative for PE did show bilateral infiltrates consistent with sequela Covid infection.  EKG was showing normal sinus rhythm BNP was unremarkable I sent his troponins were negative.  Hemoglobin was around 11.  Patient admitted for acute respiratory failure with recent Covid infection.  Review of Systems: As per HPI, rest all negative.     Past Medical History:  Diagnosis Date  . Anemia yrs ago  . Arthritis    knees  . Bronchitis    hx  . Complication of anesthesia    trouble breathing when wake up needs breathing tx when wakes up  . Hx of colonic polyps 2015  . Obstructive sleep apnea 06/02/2016   No CPAP used  . Recurrent ventral hernia 09/08/2013  . Umbilical hernia     Past Surgical History:  Procedure Laterality Date  . ABDOMINAL HYSTERECTOMY     partial  . BREAST SURGERY Bilateral    cyst removed  . COLON SURGERY    . COLONOSCOPY WITH PROPOFOL N/A 05/29/2017   Procedure: COLONOSCOPY WITH PROPOFOL;  Surgeon: Wilford Corner, MD;  Location: [facility]COPY;  Service: Endoscopy;  Laterality: N/A;  . FINGER SURGERY     right pinkie x 3  . HERNIA REPAIR      . KNEE SURGERY Right 2017  . PARTIAL COLECTOMY Left ~2004   for obstruction  . right foot surgery  yrs ago  . stomach tumur     [id number] benign     reports that she has been smoking cigarettes. She has smoked for the past 20.00 years. She has never used smokeless tobacco. She reports current alcohol use. She reports that she does not use drugs.  Allergies  Allergen Reactions  . Latex Hives and Itching  . Penicillins Hives, Itching and Other (See Comments)    Has patient had a PCN reaction causing immediate rash, facial/tongue/throat swelling, SOB or lightheadedness with hypotension: Yes Has patient had a PCN reaction causing severe rash involving mucus membranes or skin necrosis: Unk Has patient had a PCN reaction that required hospitalization: Unk Has patient had a PCN reaction occurring within the last 10 years: No If all of the above answers are "NO", then may proceed with Cephalosporin use.   . Tape Hives, Itching and Other (See Comments)    Coban wrap, in particular  . Codeine Itching    Family History  Problem Relation Age of Onset  . Deep vein thrombosis Mother   . Diabetes Mother   . Hypertension Mother   . Varicose Veins Mother   . Cancer Father        colon  . Hypertension Sister   . Hyperlipidemia Brother   .  Hypertension Daughter   . Heart attack Son   . Cancer Maternal Uncle     Prior to Admission medications   Medication Sig Start Date End Date Taking? Authorizing Provider  acetaminophen (TYLENOL) 325 MG tablet Take 2 tablets (650 mg total) by mouth every 6 (six) hours as needed for mild pain or headache (fever >/= 101). 05/11/20  Yes Arrien, Jimmy Picket, MD  albuterol (PROVENTIL HFA;VENTOLIN HFA) 108 (90 Base) MCG/ACT inhaler Inhale 2 puffs into the lungs every 6 (six) hours as needed for wheezing or shortness of breath. 01/19/17  Yes Minus Liberty, MD  gabapentin (NEURONTIN) 300 MG capsule Take 300 mg by mouth 3 (three) times daily as needed  (nerve pain).  07/17/19  Yes [provider]  guaiFENesin-dextromethorphan (ROBITUSSIN DM) 100-10 MG/5ML syrup Take 5 mLs by mouth every 6 (six) hours as needed for cough. 05/11/20  Yes Arrien, Jimmy Picket, MD  Nutritional Supplements (FEEDING SUPPLEMENT, KATE FARMS STANDARD 1.4,) LIQD liquid Take 325 mLs by mouth daily. 05/11/20 06/10/20 Yes Arrien, Jimmy Picket, MD  predniSONE (DELTASONE) 10 MG tablet Take 2 tablets daily for one week, then take 1 tablet daily for one week, then take half tablet daily for one week. 05/11/20  Yes Arrien, Jimmy Picket, MD  zolpidem (AMBIEN) 10 MG tablet Take 10 mg by mouth at bedtime as needed for sleep.  07/18/19  Yes [provider]  albuterol (PROVENTIL) (2.5 MG/3ML) 0.083% nebulizer solution Take 3 mLs (2.5 mg total) by nebulization every 6 (six) hours as needed for wheezing or shortness of breath. Patient not taking: Reported on 05/26/2020 02/08/19   Ok Edwards, PA-C  budesonide-formoterol [facility]) 160-4.5 MCG/ACT inhaler Inhale 2 puffs into the lungs 2 (two) times daily. Patient not taking: Reported on 05/26/2020    [provider]    Physical Exam: Constitutional: Moderately built and nourished. Vitals:   05/26/20 2200 05/26/20 2230 05/26/20 2303 05/26/20 2345  BP: 118/86 (!) 169/93 (!) 151/93 (!) 156/91  Pulse: 96 98 (!) 102 96  Resp: (!) 27 (!) _0 Temp:    99.4 F (37.4 C)  TempSrc:    Oral  SpO2: 99% 96% 95% 100%  Weight:      Height:       Eyes: Anicteric no pallor. ENMT: No discharge from the ears eyes nose or mouth. Neck: No JVD appreciated no mass felt. Respiratory: Mild expiratory wheeze and no crepitations. Cardiovascular: S1-S2 heard. Abdomen: Soft nontender bowel sounds present. Musculoskeletal: No edema. Skin: No rash. Neurologic: Alert awake oriented to time place and person.  Moves all extremities. Psychiatric: Appears normal.  Normal affect.   Labs on Admission: I have personally  reviewed following labs and imaging studies  CBC: Recent Labs  Lab 05/26/20 1319  WBC 10.1  NEUTROABS 7.2  HGB 11.0*  HCT 35.4*  MCV 89.8  PLT 734   Basic Metabolic Panel: Recent Labs  Lab 05/26/20 1319  NA 141  K 3.9  CL 99  CO2 30  GLUCOSE 119*  BUN 11  CREATININE 0.81  CALCIUM 9.9   GFR: Estimated Creatinine Clearance: 75.8 mL/min (by C-G formula based on SCr of 0.81 mg/dL). Liver Function Tests: No results for input(s): AST, ALT, ALKPHOS, BILITOT, PROT, ALBUMIN in the last 168 hours. No results for input(s): LIPASE, AMYLASE in the last 168 hours. No results for input(s): AMMONIA in the last 168 hours. Coagulation Profile: No results for input(s): INR, PROTIME in the last 168 hours. Cardiac Enzymes: No results  for input(s): CKTOTAL, CKMB, CKMBINDEX, TROPONINI in the last 168 hours. BNP (last 3 results) No results for input(s): PROBNP in the last 8760 hours. HbA1C: No results for input(s): HGBA1C in the last 72 hours. CBG: No results for input(s): GLUCAP in the last 168 hours. Lipid Profile: No results for input(s): CHOL, HDL, LDLCALC, TRIG, CHOLHDL, LDLDIRECT in the last 72 hours. Thyroid Function Tests: No results for input(s): TSH, T4TOTAL, FREET4, T3FREE, THYROIDAB in the last 72 hours. Anemia Panel: No results for input(s): VITAMINB12, FOLATE, FERRITIN, TIBC, IRON, RETICCTPCT in the last 72 hours. Urine analysis:    Component Value Date/Time   COLORURINE YELLOW 07/25/2019 [place] 07/25/2019 1343   APPEARANCEUR Cloudy (A) 04/25/2019 1132   LABSPEC 1.018 07/25/2019 1343   PHURINE 5.0 07/25/2019 1343   GLUCOSEU NEGATIVE 07/25/2019 1343   HGBUR NEGATIVE 07/25/2019 1343   BILIRUBINUR NEGATIVE 07/25/2019 1343   BILIRUBINUR Negative 04/25/2019 [place] 07/25/2019 1343   PROTEINUR NEGATIVE 07/25/2019 1343   NITRITE NEGATIVE 07/25/2019 1343   LEUKOCYTESUR NEGATIVE 07/25/2019 1343   Sepsis  Labs: _0 (procalcitonin:4,lacticidven:4) )No results found for this or any previous visit (from the past 240 hour(s)).   Radiological Exams on Admission: DG Chest 2 View  Result Date: 05/26/2020 CLINICAL DATA:  Hypoxia. EXAM: CHEST - 2 VIEW COMPARISON:  Chest x-ray dated May 06, 2020. FINDINGS: Unchanged mild cardiomegaly. Normal pulmonary vascularity. Patchy bilateral airspace opacities are not significantly changed. No pneumothorax or large pleural effusion. No acute osseous abnormality. IMPRESSION: 1. Unchanged patchy bilateral airspace disease. Electronically Signed   By: Titus Dubin M.D.   On: 05/26/2020 14:40   CT Angio Chest PE W and/or Wo Contrast  Result Date: 05/26/2020 CLINICAL DATA:  64 year old female with recent COVID. Concern for pulmonary embolism. EXAM: CT ANGIOGRAPHY CHEST WITH CONTRAST TECHNIQUE: Multidetector CT imaging of the chest was performed using the standard protocol during bolus administration of intravenous contrast. Multiplanar CT image reconstructions and MIPs were obtained to evaluate the vascular anatomy. CONTRAST:  79m OMNIPAQUE IOHEXOL 350 MG/ML SOLN COMPARISON:  Chest CT dated 04/05/2020. FINDINGS: Cardiovascular: Borderline cardiomegaly. No pericardial effusion. The thoracic aorta is unremarkable. The origins of the great vessels of the aortic arch appear patent. Evaluation of the pulmonary arteries is very limited due to respiratory motion artifact. No large or central pulmonary artery embolus identified. Mediastinum/Nodes: Top-normal hilar and mediastinal lymph nodes, likely reactive. The esophagus and the thyroid gland are grossly unremarkable. No mediastinal fluid collection. Lungs/Pleura: Diffuse bilateral interstitial and streaky densities as well as ground-glass airspace opacities primarily involving the lung bases in keeping with sequela of COVID-19 pneumonia. Overall improved aeration of the lungs compared to prior CT. There is no pleural  effusion pneumothorax. The central airways are patent. Upper Abdomen: No acute abnormality. Musculoskeletal: Degenerative changes of the spine. No acute osseous pathology. Review of the MIP images confirms the above findings. IMPRESSION: 1. No CT evidence of central pulmonary artery embolus. 2. Diffuse bilateral interstitial and streaky densities as well as ground-glass airspace opacities primarily involving the lung bases in keeping with sequela of COVID-19 pneumonia. Overall improved aeration of the lungs compared to prior CT. Electronically Signed   By: AAnner CreteM.D.   On: 05/26/2020 20:20    EKG: Independently reviewed.  Normal sinus rhythm.  Assessment/Plan Principal Problem:   Acute respiratory failure with hypoxia (HCC) Active Problems:   Morbid obesity ([place]    1. Acute respiratory failure with hypoxia with recent Covid infection with  CT scan showing bilateral infiltrates consistent with sequela of Covid infection.  On my exam patient does have mild expiratory wheeze for which I placed patient on nebulizer and steroids.  Closely follow respiratory status.  Patient also has elevated blood pressure reading which could be contributing to patient's symptoms.  2D echo done and April 24, 2020 was showing EF of 60 to 65%.  May consider pulmonary consult in the morning. 2. Elevated blood pressure reading presently not on any antihypertensives we will keep patient on as needed IV hydralazine and follow blood pressure trends.  Probably contributing to patient's symptoms. 3. Morbid obesity will need counseling. 4. Anemia  -follow CBC. 5. History of asthma presently not on any medications.  See #1.   DVT prophylaxis: Lovenox. Code Status: Full code. Family Communication: Discussed with patient. Disposition Plan: To be determined. Consults called: None. Admission status: Observation.   Rise Patience MD [facility] Pager [phone].  If 7PM-7AM, please contact  night-coverage [email or web address] Password [email or web address]  05/27/2020, 2:08 AM

## 2020-05-28 DIAGNOSIS — J189 Pneumonia, unspecified organism: Principal | ICD-10-CM

## 2020-05-28 DIAGNOSIS — R030 Elevated blood-pressure reading, without diagnosis of hypertension: Secondary | ICD-10-CM | POA: Diagnosis not present

## 2020-05-28 DIAGNOSIS — J9601 Acute respiratory failure with hypoxia: Secondary | ICD-10-CM | POA: Diagnosis not present

## 2020-05-28 DIAGNOSIS — Z8616 Personal history of covid-19: Secondary | ICD-10-CM | POA: Diagnosis not present

## 2020-05-28 LAB — RENAL FUNCTION PANEL
Albumin: 3.4 g/dL — ABNORMAL LOW (ref 3.5–5.0)
Anion gap: 8 (ref 5–15)
BUN: 19 mg/dL (ref 8–23)
CO2: 29 mmol/L (ref 22–32)
Calcium: 10.3 mg/dL (ref 8.9–10.3)
Chloride: 103 mmol/L (ref 98–111)
Creatinine, Ser: 0.82 mg/dL (ref 0.44–1.00)
GFR, Estimated: >60 mL/min (ref >60)
Glucose, Bld: 190 mg/dL — ABNORMAL HIGH (ref 70–99)
Phosphorus: 4.3 mg/dL (ref 2.5–4.6)
Potassium: 4.7 mmol/L (ref 3.5–5.1)
Sodium: 140 mmol/L (ref 135–145)

## 2020-05-28 LAB — CBC
HCT: 33.9 % — ABNORMAL LOW (ref 36.0–46.0)
Hemoglobin: 10.6 g/dL — ABNORMAL LOW (ref 12.0–15.0)
MCH: 27.8 pg (ref 26.0–34.0)
MCHC: 31.3 g/dL (ref 30.0–36.0)
MCV: 89 fL (ref 80.0–100.0)
Platelets: 225 10*3/uL (ref 150–400)
RBC: 3.81 MIL/uL — ABNORMAL LOW (ref 3.87–5.11)
RDW: 16.3 % — ABNORMAL HIGH (ref 11.5–15.5)
WBC: 15.4 10*3/uL — ABNORMAL HIGH (ref 4.0–10.5)
nRBC: 0 % (ref 0.0–0.2)

## 2020-05-28 LAB — PROCALCITONIN: Procalcitonin: <0.1 ng/mL

## 2020-05-28 LAB — MAGNESIUM: Magnesium: 2 mg/dL (ref 1.7–2.4)

## 2020-05-28 MED ORDER — METHYLPREDNISOLONE SODIUM SUCC 125 MG IJ SOLR
40.0000 mg | Freq: Every day | INTRAMUSCULAR | Status: DC
  Administered 2020-05-29: 40 mg via INTRAVENOUS
  Filled 2020-05-28: qty 2

## 2020-05-28 MED ORDER — ZOLPIDEM TARTRATE 5 MG PO TABS
5.0000 mg | ORAL_TABLET | Freq: Once | ORAL | Status: AC
  Administered 2020-05-28: 5 mg via ORAL
  Filled 2020-05-28: qty 1

## 2020-05-28 MED ORDER — FUROSEMIDE 10 MG/ML IJ SOLN
40.0000 mg | Freq: Two times a day (BID) | INTRAMUSCULAR | Status: DC
  Administered 2020-05-28 – 2020-05-29 (×2): 40 mg via INTRAVENOUS
  Filled 2020-05-28 (×2): qty 4

## 2020-05-28 NOTE — Progress Notes (Signed)
Nutrition Brief Note  Patient identified on the Malnutrition Screening Tool (MST) Report  No weight loss noted per weight records.  Wt Readings from Last 15 Encounters:  05/28/20 97.8 kg  05/04/20 92.1 kg  04/25/19 99.4 kg  11/02/17 99.2 kg  07/12/17 96.1 kg  05/29/17 97.3 kg  05/29/17 95.7 kg  03/06/17 95.7 kg  01/19/17 95.1 kg  10/04/16 97.1 kg  06/19/16 98.1 kg  06/02/16 98.3 kg  08/02/15 94.3 kg  07/27/15 96.2 kg  10/01/14 96.3 kg    Body mass index is 37.61 kg/m. Patient meets criteria for obesity based on current BMI.   Current diet order is regular. Labs and medications reviewed.   No nutrition interventions warranted at this time. If nutrition issues arise, please consult RD.   Clayton Bibles, MS, RD, LDN Inpatient Clinical Dietitian Contact information available via Amion

## 2020-05-28 NOTE — Progress Notes (Signed)
PROGRESS NOTE    Tiffany Coleman  JIR:[id number] DOB: Apr 28, 1956 DOA: 05/26/2020 PCP: Nolene Ebbs, MD   Chief Complaint  Patient presents with  . Shortness of Breath  . hypoxia  . Back Pain    Brief Narrative:  Patient is a 64 year old female history of asthma not on any medications, recently hospitalized for COVID-19 infection resulting in acute respiratory failure, O2 dependent now, extensive hospital stay and discharged home on May 11, 2020 approximately 3 weeks ago on 5 L oxygen presented back to the ED with persistent shortness of breath since discharge, cough, shortness of breath on minimal exertion, chest tightness.  Patient seen in the ED noted to be hypoxic requiring 6 L oxygen.  CT angiogram chest negative for PE but did show extensive bilateral infiltrates consistent with sequelae of Covid infection.  Patient admitted for acute respiratory failure with recent Covid infection and placed empirically on IV Solu-Medrol as well as albuterol MDI.   Assessment & Plan:   Principal Problem:   Acute respiratory failure with hypoxia (HCC) Active Problems:   Pneumonitis: Post Covid pneumonitis   Morbid obesity (HCC)   Elevated BP without diagnosis of hypertension   Acute respiratory failure with hypoxemia (HCC)   Anemia   Asthma   History of COVID-19  1 acute respiratory failure with hypoxia secondary to post Covid pneumonitis. Patient presented with shortness of breath on minimal exertion.  Patient recently hospitalized for COVID-19 infection.  CT angiogram chest done negative for PE however showed bilateral infiltrates consistent with sequelae of Covid infection.  Patient noted on presentation to have some mild expiratory wheezing.  Patient placed on IV steroids as well as albuterol MDIs. Patient with some clinical improvement however still per RN with shortness of breath on minimal exertion with increased O2 requirements on exertion. Change IV Solu-Medrol to 40  mg daily and transition to oral prednisone in the next 1 to 2 days. Continue Pulmicort, Mucinex, Claritin, Flonase, PPI, Hycodan as needed. Will place on Lasix 40 mg IV every 12 hours for now and monitor urine output. Wean O2 to keep sats greater than 92%. Incentive spirometer, flutter valve. Patient has been assessed by PCCM who are recommending continuation of supplemental oxygen, prolonged slow steroid taper, follow-up pain pulmonary clinic for monitoring for development of post Covid fibrosis. Procalcitonin levels were negative and as such no need for antibiotics at this time. Supportive care. Follow.  2.  Elevated blood pressure reading Patient noted not to be on any antihypertensives prior to admission. Will place patient on IV Lasix secondary to problem #1. Hydralazine as needed. Will need outpatient follow-up.  3.  Morbid obesity  4.  History of asthma Not on any medications.  See #1.  5.  Anemia Patient with no overt bleeding.  Check an anemia panel.  Follow H&H.  Transfusion threshold hemoglobin < 7.  6.  OSA CPAP nightly.  DVT prophylaxis: Lovenox Code Status: Full Family Communication: Updated patient.  No family at bedside. Disposition:   Status is: Inpatient    Dispo: The patient is from: Home              Anticipated d/c is to: SNF              Anticipated d/c date is: 1 to 2 days              Patient currently on IV steroids, IV Lasix, shortness of breath on minimal exertion, not stable for discharge.  Consultants:   Pulmonary: Dr. Vaughan Browner 05/27/2020  Procedures:   CT angiogram chest 05/26/2020  Chest x-ray 05/26/2020  Antimicrobials:  None   Subjective: Patient states some improvement with cough and breathing after being placed on nebulizer treatments. Patient still with significant shortness of breath on minimal exertion per RN. Patient denies any chest pain.  Objective: Vitals:   05/28/20 0847 05/28/20 1120 05/28/20 1312 05/28/20 1412  BP:    (!) 146/81   Pulse:   86   Resp:   (!) 21   Temp:   99 F (37.2 C)   TempSrc:   Oral   SpO2: 95%  95% 96%  Weight:  97.8 kg    Height:        Intake/Output Summary (Last 24 hours) at 05/28/2020 1802 Last data filed at 05/28/2020 1526 Gross per 24 hour  Intake 640 ml  Output 3125 ml  Net -2485 ml   Filed Weights   05/26/20 1315 05/28/20 1120  Weight: 95.3 kg 97.8 kg    Examination:  General exam: NAD Respiratory system: Decreased coarse breath sounds diffusely. Fair air movement. No wheezing noted. Cardiovascular system: Regular rate rhythm no murmurs rubs or gallops. No JVD. No lower extremity edema.  Gastrointestinal system: Abdomen is soft, nontender, nondistended, positive bowel sounds. No rebound. No guarding. Central nervous system: Alert and oriented. No focal neurological deficits. Extremities: Symmetric 5 x 5 power. Skin: No rashes, lesions or ulcers Psychiatry: Judgement and insight appear normal. Mood & affect appropriate.     Data Reviewed: I have personally reviewed following labs and imaging studies  CBC: Recent Labs  Lab 05/26/20 1319 05/27/20 0433 05/28/20 0446  WBC 10.1 8.9 15.4*  NEUTROABS 7.2  --   --   HGB 11.0* 10.4* 10.6*  HCT 35.4* 33.9* 33.9*  MCV 89.8 90.4 89.0  PLT 192 198 702    Basic Metabolic Panel: Recent Labs  Lab 05/26/20 1319 05/27/20 0433 05/28/20 0446  NA 141 139 140  K 3.9 4.3 4.7  CL 99 102 103  CO2 _0 GLUCOSE 119* 111* 190*  BUN _1 CREATININE 0.81 0.72 0.82  CALCIUM 9.9 9.4 10.3  MG  --   --  2.0  PHOS  --   --  4.3    GFR: Estimated Creatinine Clearance: 76 mL/min (by C-G formula based on SCr of 0.82 mg/dL).  Liver Function Tests: Recent Labs  Lab 05/28/20 0446  ALBUMIN 3.4*    CBG: No results for input(s): GLUCAP in the last 168 hours.   No results found for this or any previous visit (from the past 240 hour(s)).       Radiology Studies: CT Angio Chest PE W and/or Wo  Contrast  Result Date: 05/26/2020 CLINICAL DATA:  64 year old female with recent COVID. Concern for pulmonary embolism. EXAM: CT ANGIOGRAPHY CHEST WITH CONTRAST TECHNIQUE: Multidetector CT imaging of the chest was performed using the standard protocol during bolus administration of intravenous contrast. Multiplanar CT image reconstructions and MIPs were obtained to evaluate the vascular anatomy. CONTRAST:  72m OMNIPAQUE IOHEXOL 350 MG/ML SOLN COMPARISON:  Chest CT dated 04/05/2020. FINDINGS: Cardiovascular: Borderline cardiomegaly. No pericardial effusion. The thoracic aorta is unremarkable. The origins of the great vessels of the aortic arch appear patent. Evaluation of the pulmonary arteries is very limited due to respiratory motion artifact. No large or central pulmonary artery embolus identified. Mediastinum/Nodes: Top-normal hilar and mediastinal lymph nodes, likely reactive. The esophagus and the thyroid gland are  grossly unremarkable. No mediastinal fluid collection. Lungs/Pleura: Diffuse bilateral interstitial and streaky densities as well as ground-glass airspace opacities primarily involving the lung bases in keeping with sequela of COVID-19 pneumonia. Overall improved aeration of the lungs compared to prior CT. There is no pleural effusion pneumothorax. The central airways are patent. Upper Abdomen: No acute abnormality. Musculoskeletal: Degenerative changes of the spine. No acute osseous pathology. Review of the MIP images confirms the above findings. IMPRESSION: 1. No CT evidence of central pulmonary artery embolus. 2. Diffuse bilateral interstitial and streaky densities as well as ground-glass airspace opacities primarily involving the lung bases in keeping with sequela of COVID-19 pneumonia. Overall improved aeration of the lungs compared to prior CT. Electronically Signed   By: Anner Crete M.D.   On: 05/26/2020 20:20        Scheduled Meds: . budesonide (PULMICORT) nebulizer solution   0.5 mg Nebulization BID  . enoxaparin (LOVENOX) injection  40 mg Subcutaneous Q24H  . fluticasone  2 spray Each Nare Daily  . furosemide  40 mg Intravenous Q12H  . guaiFENesin  1,200 mg Oral BID  . ipratropium-albuterol  3 mL Nebulization TID  . loratadine  10 mg Oral Daily  . [START ON 05/29/2020] methylPREDNISolone (SOLU-MEDROL) injection  40 mg Intravenous Daily  . pantoprazole  40 mg Oral Q0600  . polyethylene glycol  17 g Oral Daily   Continuous Infusions:   LOS: 1 day    Time spent: 35 minutes  No charge.    Irine Seal, MD [facility]   To contact the attending provider between 7A-7P or the covering provider during after hours 7P-7A, please log into the web site [email or web address] and access using universal [place] password for that web site. If you do not have the password, please call the hospital operator.  05/28/2020, 6:02 PM

## 2020-05-28 NOTE — Plan of Care (Signed)

## 2020-05-28 NOTE — Evaluation (Signed)
Occupational Therapy Evaluation Patient Details Name: Tiffany Coleman MRN: [id number] DOB: 01-24-1956 Today's Date: 05/28/2020    History of Present Illness 64 y.o. female with a history of morbid obesity, OSA not on CPAP, GAD, GERD, and recent covid-19 diagnosis at PCP office and had previous admission for Acute hypoxemic respiratory failure due to COVID-19 pneumonia.  Pt admitted back 05/26/20 with hypoxic respiratory failure.  No PE on CTA but imaging shows extensive bilateral infiltrates   Clinical Impression   Patient is currently requiring assistance with ADLs including minimal assist to min guard with bathing, toileting, and dressing, and Min guard for functional mobility using RW with desaturation of O2 to 89% on 6L via [place] with very light activity, all of which is below patient's typical baseline of being Independent prior to previous hospitalization.  During this evaluation, patient was limited by impaired activity tolerance and unsteadiness on feet, which has the potential to impact patient's safety and independence during functional mobility, as well as performance for ADLs. [place] "6-clicks" Daily Activity Inpatient Short Form score of 19/24 indicates a 42.80% ADL impairment this session. Patient lives with her 1 year old grandson, who is able to provide minimal assistance as he has school and work.  Pt's daughter is 4 houses down and assists as needed with B/IADLs but also works.   Patient demonstrates good rehab potential, and should benefit from continued skilled occupational therapy services while in acute care to maximize safety, independence and quality of life at home.  Continued occupational therapy services in a SNF setting prior to return home is recommended.  ?   Follow Up Recommendations  SNF    Equipment Recommendations  Tub/shower bench    Recommendations for Other Services       Precautions / Restrictions Precautions Precautions:  Fall Precaution Comments: monitor O2 sats and HR Restrictions Weight Bearing Restrictions: No      Mobility Bed Mobility Overal bed mobility: Needs Assistance Bed Mobility: Sit to Supine     Supine to sit: Supervision;HOB elevated Sit to supine: Supervision;HOB elevated   General bed mobility comments: increased time and effort, pt on 6L O2 [place] upon entering room, SpO2 at 89%.  Scooting along EOB laterally with supervision.  Transfers Overall transfer level: Needs assistance Equipment used: Rolling walker (2 wheeled) Transfers: Stand Pivot Transfers Sit to Stand: Min guard Stand pivot transfers: Min guard       General transfer comment: min/guard for safety, increased to 8L O2 [place] for mobility    Balance Overall balance assessment: Needs assistance Sitting-balance support: Single extremity supported Sitting balance-Leahy Scale: Good     Standing balance support: Bilateral upper extremity supported Standing balance-Leahy Scale: Poor                             ADL either performed or assessed with clinical judgement   ADL Overall ADL's : Needs assistance/impaired Eating/Feeding: Independent   Grooming: Set up;Sitting   Upper Body Bathing: Set up;Sitting   Lower Body Bathing: Minimal assistance;Sit to/from stand;Sitting/lateral leans;Min guard   Upper Body Dressing : Set up;Sitting   Lower Body Dressing: Set up;Sit to/from stand;Minimal assistance Lower Body Dressing Details (indicate cue type and reason): Pt reports that her daughter may have ordered the "hip kit" AE, but has not yet received. Pt reports that she learned how to use all AE for LB ADLs at previous admission. Toilet Transfer: Magazine features editor Details (indicate cue  type and reason): Stand pivot from recliner to [place] and Hygiene: Min guard;Sitting/lateral lean;Sit to/from stand       Functional mobility during ADLs: Min guard General ADL  Comments: Pt on 6L. SpO2 at 89% after pivot from recliner to EOB with recovery to 90% without increased O2.  HR: 126.     Vision Baseline Vision/History: No visual deficits Patient Visual Report: No change from baseline Vision Assessment?: No apparent visual deficits     Perception     Praxis      Pertinent Vitals/Pain Pain Assessment: No/denies pain     Hand Dominance Right   Extremity/Trunk Assessment Upper Extremity Assessment Upper Extremity Assessment: Overall WFL for tasks assessed   Lower Extremity Assessment Lower Extremity Assessment: Generalized weakness   Cervical / Trunk Assessment Cervical / Trunk Assessment: Normal   Communication Communication Communication: No difficulties   Cognition Arousal/Alertness: Awake/alert Behavior During Therapy: WFL for tasks assessed/performed Overall Cognitive Status: Within Functional Limits for tasks assessed                                 General Comments: A&Ox4. Pleasant   General Comments       Exercises     Shoulder Instructions      Home Living Family/patient expects to be discharged to:: Skilled nursing facility Living Arrangements: Other relatives Available Help at Discharge: Family Type of Home: House       Home Layout: Two level;1/2 bath on main level;Able to live on main level with bedroom/bathroom Alternate Level Stairs-Number of Steps: flight Alternate Level Stairs-Rails: None Bathroom Shower/Tub: Tub/shower unit (Upstairs only. Pt has been sponge bathing in main floor 1/2 bath since d/c home.)   Bathroom Toilet: Standard     Home Equipment: Walker - 4 wheels;Bedside commode   Additional Comments: plans to stay downstairs during day with 1/2 bathroom, have family assist up to bedroom when needed. Reports typically perform sink bath. Sleeps in recliners on main floor with BSC next to her. works in the Forensic psychologist at [employer]. - from previous admission, pt now plans on SNF       Prior Functioning/Environment Level of Independence: Independent with assistive device(s)        Comments: pt has been using Rollator and requiring at least 5L O2 [place] upon d/c home from last admission, often bumping to 6L PRN; pt reports difficulty performing tasks at home, fatigues quickly and has bumped up oxygen due to dyspnea with activity at home        OT Problem List: Decreased activity tolerance;Cardiopulmonary status limiting activity;Obesity      OT Treatment/Interventions: Self-care/ADL training;Therapeutic exercise;Energy conservation;DME and/or AE instruction;Patient/family education;Therapeutic activities    OT Goals(Current goals can be found in the care plan section) Acute Rehab OT Goals Patient Stated Goal: Walk independently with good O2 saturation. OT Goal Formulation: With patient Time For Goal Achievement: 06/11/20 Potential to Achieve Goals: Good ADL Goals Pt Will Perform Grooming: standing;with modified independence (with SpO2 >90%) Pt Will Perform Lower Body Dressing: with adaptive equipment;with modified independence;sitting/lateral leans;sit to/from stand Pt Will Transfer to Toilet: with modified independence;bedside commode Pt Will Perform Toileting - Clothing Manipulation and hygiene: with modified independence;with adaptive equipment Additional ADL Goal #1: Pt will identify at least 3 energy conservation strategies to employ at home in order to optimize function and avoid rehospitalization. Additional ADL Goal #2: Pt will tolerate BUE AROM HEP x8-10 reps  of 2 sets with rest break in b/t sitting EOB with SpO2 over 90% and RPE no greater than 5.  OT Frequency: Min 2X/week   Barriers to D/C:    Family members work. Pt alone during the day.       Co-evaluation              AM-PAC OT "6 Clicks" Daily Activity     Outcome Measure Help from another person eating meals?: None Help from another person taking care of personal grooming?: A Little Help  from another person toileting, which includes using toliet, bedpan, or urinal?: A Little Help from another person bathing (including washing, rinsing, drying)?: A Little Help from another person to put on and taking off regular upper body clothing?: A Little Help from another person to put on and taking off regular lower body clothing?: A Little 6 Click Score: 19   End of Session Equipment Utilized During Treatment: Oxygen;Rolling walker Nurse Communication: Mobility status  Activity Tolerance: Patient limited by fatigue Patient left: with call bell/phone within reach;with nursing/sitter in room;in bed  OT Visit Diagnosis: Unsteadiness on feet (R26.81)                Time: 4462-8638 OT Time Calculation (min): 16 min Charges:  OT General Charges $OT Visit: 1 Visit OT Evaluation $OT Eval Low Complexity: 1 Low  Jacier Gladu, OT Acute Rehab Services Office: [phone] 05/28/2020   Julien Girt 05/28/2020, 2:15 PM

## 2020-05-28 NOTE — Evaluation (Signed)
Physical Therapy Evaluation Patient Details Name: Tiffany Coleman MRN: [id number] DOB: 10-08-1955 Today's Date: 05/28/2020   History of Present Illness  64 y.o. female with a history of morbid obesity, OSA not on CPAP, GAD, GERD, and recent covid-19 diagnosis at PCP office and had previous admission for Acute hypoxemic respiratory failure due to COVID-19 pneumonia.  Pt admitted back 05/26/20 with hypoxic respiratory failure.  No PE on CTA but imaging shows extensive bilateral infiltrates  Clinical Impression  Pt admitted with above diagnosis.  Pt currently with functional limitations due to the deficits listed below (see PT Problem List). Pt will benefit from skilled PT to increase their independence and safety with mobility to allow discharge to the venue listed below.  Pt reports she was struggling upon d/c home after previous admission.  Pt states she had her daughter walking with her to manage oxygen setting as she was becoming more SOB with activity.  Pt with limited endurance, drop in saturations with exertion, and increased oxygen demands.  Pt very pleasant and willing to participate.  Pt agreeable to SNF.     Follow Up Recommendations SNF    Equipment Recommendations  None recommended by PT    Recommendations for Other Services       Precautions / Restrictions Precautions Precautions: Fall Precaution Comments: monitor O2 sats and HR      Mobility  Bed Mobility Overal bed mobility: Needs Assistance Bed Mobility: Supine to Sit     Supine to sit: Supervision;HOB elevated     General bed mobility comments: increased time and effort, pt on 6L O2 [place] upon entering room and SPO2 96% at rest, dropped to 76% with only bed mobility and required at least 3 minutes to recover to 89%  Transfers Overall transfer level: Needs assistance Equipment used: Rolling walker (2 wheeled) Transfers: Sit to/from Stand Sit to Stand: Min guard         General transfer comment:  min/guard for safety, increased to 8L O2 [place] for mobility  Ambulation/Gait Ambulation/Gait assistance: Min guard Gait Distance (Feet): 120 Feet Assistive device: Rolling walker (2 wheeled) Gait Pattern/deviations: Step-through pattern;Decreased stride length Gait velocity: decr   General Gait Details: SpO2 dropped to 84% on 8L O2 [place], improved to upper 80s with cue for rest breaks and breathing  Stairs            Wheelchair Mobility    Modified Rankin (Stroke Patients Only)       Balance                                             Pertinent Vitals/Pain Pain Assessment: No/denies pain    Home Living Family/patient expects to be discharged to:: Skilled nursing facility Living Arrangements: Other relatives Available Help at Discharge: Family Type of Home: House       Home Layout: Two level   Additional Comments: plans to stay downstairs during day with bathroom, have family assist up to bedroom when needed. Reports typically perform sink bath.  works in Geologist, engineering at [employer]. - from previous admission, pt now plans on SNF    Prior Function Level of Independence: Independent with assistive device(s)         Comments: pt has been using RW and requiring at least 5L O2 [place] upon d/c home from last admission; pt reports difficulty performing tasks at home, fatigues  quickly and has bumped up oxygen due to dyspnea with activity at home     Hand Dominance   Dominant Hand: Right    Extremity/Trunk Assessment        Lower Extremity Assessment Lower Extremity Assessment: Generalized weakness    Cervical / Trunk Assessment Cervical / Trunk Assessment: Normal  Communication   Communication: No difficulties  Cognition Arousal/Alertness: Awake/alert Behavior During Therapy: WFL for tasks assessed/performed Overall Cognitive Status: Within Functional Limits for tasks assessed                                        General  Comments      Exercises     Assessment/Plan    PT Assessment Patient needs continued PT services  PT Problem List Decreased mobility;Decreased activity tolerance;Cardiopulmonary status limiting activity;Decreased balance;Decreased knowledge of use of DME       PT Treatment Interventions Gait training;DME instruction;Therapeutic exercise;Functional mobility training;Therapeutic activities;Patient/family education;Balance training    PT Goals (Current goals can be found in the Care Plan section)  Acute Rehab PT Goals PT Goal Formulation: With patient Time For Goal Achievement: 06/11/20 Potential to Achieve Goals: Good    Frequency Min 2X/week   Barriers to discharge        Co-evaluation               AM-PAC PT "6 Clicks" Mobility  Outcome Measure Help needed turning from your back to your side while in a flat bed without using bedrails?: None Help needed moving from lying on your back to sitting on the side of a flat bed without using bedrails?: A Little Help needed moving to and from a bed to a chair (including a wheelchair)?: A Little Help needed standing up from a chair using your arms (e.g., wheelchair or bedside chair)?: A Little Help needed to walk in hospital room?: A Little Help needed climbing 3-5 steps with a railing? : A Little 6 Click Score: 19    End of Session Equipment Utilized During Treatment: Oxygen Activity Tolerance: Patient limited by fatigue Patient left: in chair;with call bell/phone within reach Nurse Communication: Mobility status PT Visit Diagnosis: Difficulty in walking, not elsewhere classified (R26.2)    Time: 5189-8421 PT Time Calculation (min) (ACUTE ONLY): 19 min   Charges:   PT Evaluation $PT Eval Low Complexity: 1 Low        Kati PT, DPT Acute Rehabilitation Services Pager: [phone] Office: [phone]  York Ram E 05/28/2020, 11:26 AM

## 2020-05-28 NOTE — TOC Progression Note (Signed)
Transition of Care [facility]) - Progression Note    Patient Details  Name: Tiffany Coleman MRN: [id number] Date of Birth: 03-28-1956  Transition of Care [facility]) CM/SW Contact  Purcell Mouton, RN Phone Number: 05/28/2020, 11:48 AM  Clinical Narrative:    Pt accepted bed at [facility]. Bed is ready for today or tomorrow. Insurance Ref [id number].   Expected Discharge Plan: [place] Barriers to Discharge: No Barriers Identified  Expected Discharge Plan and Services Expected Discharge Plan: [place] arrangements for the past 2 months: Single Family Home                                       Social Determinants of Health (SDOH) Interventions    Readmission Risk Interventions No flowsheet data found.

## 2020-05-29 DIAGNOSIS — D649 Anemia, unspecified: Secondary | ICD-10-CM | POA: Diagnosis not present

## 2020-05-29 DIAGNOSIS — Z8616 Personal history of covid-19: Secondary | ICD-10-CM | POA: Diagnosis not present

## 2020-05-29 DIAGNOSIS — J9601 Acute respiratory failure with hypoxia: Secondary | ICD-10-CM | POA: Diagnosis not present

## 2020-05-29 DIAGNOSIS — R030 Elevated blood-pressure reading, without diagnosis of hypertension: Secondary | ICD-10-CM | POA: Diagnosis not present

## 2020-05-29 LAB — SARS CORONAVIRUS 2 BY RT PCR (HOSPITAL ORDER, PERFORMED IN ~~LOC~~ HOSPITAL LAB): SARS Coronavirus 2: NEGATIVE

## 2020-05-29 LAB — BASIC METABOLIC PANEL
Anion gap: 11 (ref 5–15)
BUN: 20 mg/dL (ref 8–23)
CO2: 31 mmol/L (ref 22–32)
Calcium: 10.3 mg/dL (ref 8.9–10.3)
Chloride: 98 mmol/L (ref 98–111)
Creatinine, Ser: 0.93 mg/dL (ref 0.44–1.00)
GFR, Estimated: >60 mL/min (ref >60)
Glucose, Bld: 150 mg/dL — ABNORMAL HIGH (ref 70–99)
Potassium: 3.8 mmol/L (ref 3.5–5.1)
Sodium: 140 mmol/L (ref 135–145)

## 2020-05-29 LAB — PHOSPHORUS: Phosphorus: 4.7 mg/dL — ABNORMAL HIGH (ref 2.5–4.6)

## 2020-05-29 LAB — CBC
HCT: 36.4 % (ref 36.0–46.0)
Hemoglobin: 11.5 g/dL — ABNORMAL LOW (ref 12.0–15.0)
MCH: 28 pg (ref 26.0–34.0)
MCHC: 31.6 g/dL (ref 30.0–36.0)
MCV: 88.8 fL (ref 80.0–100.0)
Platelets: 231 10*3/uL (ref 150–400)
RBC: 4.1 MIL/uL (ref 3.87–5.11)
RDW: 16.6 % — ABNORMAL HIGH (ref 11.5–15.5)
WBC: 16.6 10*3/uL — ABNORMAL HIGH (ref 4.0–10.5)
nRBC: 0.1 % (ref 0.0–0.2)

## 2020-05-29 LAB — MAGNESIUM: Magnesium: 2 mg/dL (ref 1.7–2.4)

## 2020-05-29 MED ORDER — PREDNISONE 20 MG PO TABS
40.0000 mg | ORAL_TABLET | Freq: Every day | ORAL | Status: DC
  Administered 2020-05-30 – 2020-05-31 (×2): 40 mg via ORAL
  Filled 2020-05-29 (×2): qty 2

## 2020-05-29 MED ORDER — FUROSEMIDE 10 MG/ML IJ SOLN
40.0000 mg | Freq: Two times a day (BID) | INTRAMUSCULAR | Status: DC
  Administered 2020-05-29 – 2020-05-30 (×2): 40 mg via INTRAVENOUS
  Filled 2020-05-29 (×2): qty 4

## 2020-05-29 NOTE — Plan of Care (Signed)
Pt had no s/s of sepsis this shift. No c/o pain or discomfort. Pt rested most shift and tolerated Lasix.  Problem: Education: Goal: Knowledge of General Education information will improve Description: Including pain rating scale, medication(s)/side effects and non-pharmacologic comfort measures Outcome: Progressing   Problem: Health Behavior/Discharge Planning: Goal: Ability to manage health-related needs will improve Outcome: Progressing   Problem: Clinical Measurements: Goal: Ability to maintain clinical measurements within normal limits will improve Outcome: Progressing Goal: Will remain free from infection Outcome: Progressing Goal: Diagnostic test results will improve Outcome: Progressing Goal: Respiratory complications will improve Outcome: Progressing Goal: Cardiovascular complication will be avoided Outcome: Progressing   Problem: Activity: Goal: Risk for activity intolerance will decrease Outcome: Progressing   Problem: Nutrition: Goal: Adequate nutrition will be maintained Outcome: Progressing   Problem: Coping: Goal: Level of anxiety will decrease Outcome: Progressing   Problem: Elimination: Goal: Will not experience complications related to bowel motility Outcome: Progressing Goal: Will not experience complications related to urinary retention Outcome: Progressing   Problem: Pain Managment: Goal: General experience of comfort will improve Outcome: Progressing   Problem: Safety: Goal: Ability to remain free from injury will improve Outcome: Progressing   Problem: Skin Integrity: Goal: Risk for impaired skin integrity will decrease Outcome: Progressing

## 2020-05-29 NOTE — Progress Notes (Signed)
PROGRESS NOTE    Tiffany Coleman  SWH:[id number] DOB: Nov 02, 1955 DOA: 05/26/2020 PCP: Nolene Ebbs, MD   Chief Complaint  Patient presents with  . Shortness of Breath  . hypoxia  . Back Pain    Brief Narrative:  Patient is a 64 year old female history of asthma not on any medications, recently hospitalized for COVID-19 infection resulting in acute respiratory failure, O2 dependent now, extensive hospital stay and discharged home on May 11, 2020 approximately 3 weeks ago on 5 L oxygen presented back to the ED with persistent shortness of breath since discharge, cough, shortness of breath on minimal exertion, chest tightness.  Patient seen in the ED noted to be hypoxic requiring 6 L oxygen.  CT angiogram chest negative for PE but did show extensive bilateral infiltrates consistent with sequelae of Covid infection.  Patient admitted for acute respiratory failure with recent Covid infection and placed empirically on IV Solu-Medrol as well as albuterol MDI.   Assessment & Plan:   Principal Problem:   Acute respiratory failure with hypoxia (HCC) Active Problems:   Pneumonitis: Post Covid pneumonitis   Morbid obesity (HCC)   Elevated BP without diagnosis of hypertension   Acute respiratory failure with hypoxemia (HCC)   Anemia   Asthma   History of COVID-19  1 acute respiratory failure with hypoxia secondary to post Covid pneumonitis. Patient presented with shortness of breath on minimal exertion.  Patient recently hospitalized for COVID-19 infection.  CT angiogram chest done negative for PE however showed bilateral infiltrates consistent with sequelae of Covid infection.  Patient noted on presentation to have some mild expiratory wheezing.  Patient placed on IV steroids as well as albuterol MDIs. Patient improving clinically after being started on IV Lasix with good diuresis.  Patient with a urine output of 4 L over the past 24 hours.  Patient is -5.9 L during this  hospitalization. Continue IV Solu-Medrol 40 mg daily and transition to oral prednisone 40 mg tomorrow with slow taper. Continue Pulmicort, Mucinex, Claritin, Flonase, PPI, Hycodan as needed.  Continue Lasix 40 mg IV every 12 hours and follow urine output.  Wean O2 to sats greater than 92%.  Continue pulmonary toilet with incentive spirometry and flutter valve. Patient has been assessed by PCCM who are recommending continuation of supplemental oxygen, prolonged slow steroid taper, follow-up pain pulmonary clinic for monitoring for development of post Covid fibrosis. Procalcitonin levels were negative and as such no need for antibiotics at this time. Supportive care. Follow.  2.  Elevated blood pressure reading Patient noted not to be on any antihypertensives prior to admission.  Patient placed on IV Lasix secondary to problem #1.  Blood pressure improved.  Hydralazine as needed.  Outpatient follow-up.   3.  Morbid obesity  4.  History of asthma Not on any medications.  See #1.  5.  Anemia Patient with no overt bleeding.  Hemoglobin stable at 11.5.  Transfusion threshold hemoglobin <7.  6.  OSA CPAP nightly.  7.  Leukocytosis Secondary to steroids.  No need for antibiotics at this time.  Follow.  DVT prophylaxis: Lovenox Code Status: Full Family Communication: Updated patient.  No family at bedside. Disposition:   Status is: Inpatient    Dispo: The patient is from: Home              Anticipated d/c is to: SNF              Anticipated d/c date is: 05/31/2020  Patient currently on IV steroids, IV Lasix, not stable for discharge.         Consultants:   Pulmonary: Dr. Vaughan Browner 05/27/2020  Procedures:   CT angiogram chest 05/26/2020  Chest x-ray 05/26/2020  Antimicrobials:  None   Subjective: Patient sleeping but arousable.  States breathing has improved significantly with diuresis.  States cough is also improving.  Denies any chest pain.    Objective: Vitals:   05/28/20 2155 05/29/20 0527 05/29/20 0749 05/29/20 0752  BP:  126/87    Pulse:  94    Resp:  (!) 22    Temp:      TempSrc:      SpO2: 97% 100% 96% 96%  Weight:      Height:        Intake/Output Summary (Last 24 hours) at 05/29/2020 1021 Last data filed at 05/29/2020 0657 Gross per 24 hour  Intake --  Output 4050 ml  Net -4050 ml   Filed Weights   05/26/20 1315 05/28/20 1120  Weight: 95.3 kg 97.8 kg    Examination:  General exam: NAD Respiratory system: Decreased coarse breath sounds diffusely.  Some scattered coarse breath sounds.  Fair air movement.  No wheezing.  Speaking in full sentences. Cardiovascular system: RRR no murmurs rubs or gallops.  No JVD.  No lower extremity edema.   Gastrointestinal system: Abdomen is soft, nontender, nondistended, positive bowel sounds.  No rebound.  No guarding.   Central nervous system: Alert and oriented. No focal neurological deficits. Extremities: Symmetric 5 x 5 power. Skin: No rashes, lesions or ulcers Psychiatry: Judgement and insight appear normal. Mood & affect appropriate.     Data Reviewed: I have personally reviewed following labs and imaging studies  CBC: Recent Labs  Lab 05/26/20 1319 05/27/20 0433 05/28/20 0446 05/29/20 0526  WBC 10.1 8.9 15.4* 16.6*  NEUTROABS 7.2  --   --   --   HGB 11.0* 10.4* 10.6* 11.5*  HCT 35.4* 33.9* 33.9* 36.4  MCV 89.8 90.4 89.0 88.8  PLT 192 198 225 039    Basic Metabolic Panel: Recent Labs  Lab 05/26/20 1319 05/27/20 0433 05/28/20 0446 05/29/20 0526  NA 141 139 140 140  K 3.9 4.3 4.7 3.8  CL 99 102 103 98  CO2 _0 GLUCOSE 119* 111* 190* 150*  BUN _1 CREATININE 0.81 0.72 0.82 0.93  CALCIUM 9.9 9.4 10.3 10.3  MG  --   --  2.0 2.0  PHOS  --   --  4.3 4.7*    GFR: Estimated Creatinine Clearance: 67 mL/min (by C-G formula based on SCr of 0.93 mg/dL).  Liver Function Tests: Recent Labs  Lab 05/28/20 0446  ALBUMIN 3.4*     CBG: No results for input(s): GLUCAP in the last 168 hours.   No results found for this or any previous visit (from the past 240 hour(s)).       Radiology Studies: No results found.      Scheduled Meds: . budesonide (PULMICORT) nebulizer solution  0.5 mg Nebulization BID  . enoxaparin (LOVENOX) injection  40 mg Subcutaneous Q24H  . fluticasone  2 spray Each Nare Daily  . furosemide  40 mg Intravenous Q12H  . guaiFENesin  1,200 mg Oral BID  . ipratropium-albuterol  3 mL Nebulization TID  . loratadine  10 mg Oral Daily  . methylPREDNISolone (SOLU-MEDROL) injection  40 mg Intravenous Daily  . pantoprazole  40 mg Oral Q0600  . polyethylene  glycol  17 g Oral Daily   Continuous Infusions:   LOS: 2 days    Time spent: 35 minutes    Irine Seal, MD [facility]   To contact the attending provider between 7A-7P or the covering provider during after hours 7P-7A, please log into the web site [email or web address] and access using universal [place] password for that web site. If you do not have the password, please call the hospital operator.  05/29/2020, 10:21 AM

## 2020-05-30 DIAGNOSIS — Z8616 Personal history of covid-19: Secondary | ICD-10-CM | POA: Diagnosis not present

## 2020-05-30 DIAGNOSIS — D649 Anemia, unspecified: Secondary | ICD-10-CM | POA: Diagnosis not present

## 2020-05-30 DIAGNOSIS — J9601 Acute respiratory failure with hypoxia: Secondary | ICD-10-CM | POA: Diagnosis not present

## 2020-05-30 DIAGNOSIS — R030 Elevated blood-pressure reading, without diagnosis of hypertension: Secondary | ICD-10-CM | POA: Diagnosis not present

## 2020-05-30 LAB — BASIC METABOLIC PANEL
Anion gap: 15 (ref 5–15)
BUN: 27 mg/dL — ABNORMAL HIGH (ref 8–23)
CO2: 30 mmol/L (ref 22–32)
Calcium: 10 mg/dL (ref 8.9–10.3)
Chloride: 92 mmol/L — ABNORMAL LOW (ref 98–111)
Creatinine, Ser: 1.05 mg/dL — ABNORMAL HIGH (ref 0.44–1.00)
GFR, Estimated: 55 mL/min — ABNORMAL LOW (ref >60)
Glucose, Bld: 137 mg/dL — ABNORMAL HIGH (ref 70–99)
Potassium: 4.8 mmol/L (ref 3.5–5.1)
Sodium: 137 mmol/L (ref 135–145)

## 2020-05-30 LAB — CBC WITH DIFFERENTIAL/PLATELET
Abs Immature Granulocytes: 0.15 10*3/uL — ABNORMAL HIGH (ref 0.00–0.07)
Basophils Absolute: 0.1 10*3/uL (ref 0.0–0.1)
Basophils Relative: 0 %
Eosinophils Absolute: 0.2 10*3/uL (ref 0.0–0.5)
Eosinophils Relative: 1 %
HCT: 39.8 % (ref 36.0–46.0)
Hemoglobin: 12.4 g/dL (ref 12.0–15.0)
Immature Granulocytes: 1 %
Lymphocytes Relative: 19 %
Lymphs Abs: 2.8 10*3/uL (ref 0.7–4.0)
MCH: 27.8 pg (ref 26.0–34.0)
MCHC: 31.2 g/dL (ref 30.0–36.0)
MCV: 89.2 fL (ref 80.0–100.0)
Monocytes Absolute: 1.1 10*3/uL — ABNORMAL HIGH (ref 0.1–1.0)
Monocytes Relative: 7 %
Neutro Abs: 10.6 10*3/uL — ABNORMAL HIGH (ref 1.7–7.7)
Neutrophils Relative %: 72 %
Platelets: 259 10*3/uL (ref 150–400)
RBC: 4.46 MIL/uL (ref 3.87–5.11)
RDW: 16.7 % — ABNORMAL HIGH (ref 11.5–15.5)
WBC: 14.9 10*3/uL — ABNORMAL HIGH (ref 4.0–10.5)
nRBC: 0 % (ref 0.0–0.2)

## 2020-05-30 LAB — MAGNESIUM: Magnesium: 1.8 mg/dL (ref 1.7–2.4)

## 2020-05-30 MED ORDER — SORBITOL 70 % SOLN
30.0000 mL | Freq: Once | Status: AC
  Administered 2020-05-30: 30 mL via ORAL
  Filled 2020-05-30: qty 30

## 2020-05-30 MED ORDER — SENNOSIDES-DOCUSATE SODIUM 8.6-50 MG PO TABS
1.0000 | ORAL_TABLET | Freq: Two times a day (BID) | ORAL | Status: DC
  Administered 2020-05-30 – 2020-05-31 (×3): 1 via ORAL
  Filled 2020-05-30 (×2): qty 1

## 2020-05-30 MED ORDER — IPRATROPIUM-ALBUTEROL 0.5-2.5 (3) MG/3ML IN SOLN
3.0000 mL | Freq: Two times a day (BID) | RESPIRATORY_TRACT | Status: DC
  Administered 2020-05-30 – 2020-05-31 (×2): 3 mL via RESPIRATORY_TRACT
  Filled 2020-05-30 (×2): qty 3

## 2020-05-30 MED ORDER — MAGNESIUM SULFATE 2 GM/50ML IV SOLN
2.0000 g | Freq: Once | INTRAVENOUS | Status: AC
  Administered 2020-05-30: 2 g via INTRAVENOUS
  Filled 2020-05-30: qty 50

## 2020-05-30 NOTE — Progress Notes (Signed)
PROGRESS NOTE    Tiffany Coleman  VQM:[id number] DOB: 11/02/1955 DOA: 05/26/2020 PCP: Nolene Ebbs, MD   Chief Complaint  Patient presents with  . Shortness of Breath  . hypoxia  . Back Pain    Brief Narrative:  Patient is a 64 year old female history of asthma not on any medications, recently hospitalized for COVID-19 infection resulting in acute respiratory failure, O2 dependent now, extensive hospital stay and discharged home on May 11, 2020 approximately 3 weeks ago on 5 L oxygen presented back to the ED with persistent shortness of breath since discharge, cough, shortness of breath on minimal exertion, chest tightness.  Patient seen in the ED noted to be hypoxic requiring 6 L oxygen.  CT angiogram chest negative for PE but did show extensive bilateral infiltrates consistent with sequelae of Covid infection.  Patient admitted for acute respiratory failure with recent Covid infection and placed empirically on IV Solu-Medrol as well as albuterol MDI.   Assessment & Plan:   Principal Problem:   Acute respiratory failure with hypoxia (HCC) Active Problems:   Pneumonitis: Post Covid pneumonitis   Morbid obesity (HCC)   Elevated BP without diagnosis of hypertension   Acute respiratory failure with hypoxemia (HCC)   Anemia   Asthma   History of COVID-19  1 acute respiratory failure with hypoxia secondary to post Covid pneumonitis. Patient presented with shortness of breath on minimal exertion.  Patient recently hospitalized for COVID-19 infection.  CT angiogram chest done negative for PE however showed bilateral infiltrates consistent with sequelae of Covid infection.  Patient noted on presentation to have some mild expiratory wheezing.  Patient placed on IV steroids as well as albuterol MDIs.  Patient started on IV Lasix with clinical improvement and good diuresis.  Urine output of 2.5 L over the past 24 hours.  Patient is -7.56 L during this hospitalization.    Transition from IV Solu-Medrol to oral prednisone 40 mg daily with slow taper.  Discontinue IV Lasix.  Continue Pulmicort, Mucinex, Claritin, Flonase, PPI, Hycodan as needed. Wean O2 to keep sats greater than 92%.  Continue pulmonary toilet with incentive spirometry and flutter valve. Patient has been assessed by PCCM who are recommending continuation of supplemental oxygen, prolonged slow steroid taper, follow-up pain pulmonary clinic for monitoring for development of post Covid fibrosis. Procalcitonin levels were negative and as such no need for antibiotics at this time. Supportive care. Follow.  2.  Elevated blood pressure reading Patient noted not to be on any antihypertensives prior to admission.  Patient placed on IV Lasix secondary to problem #1.  Improvement with blood pressure.  Continue hydralazine as needed.  Outpatient follow-up.   3.  Morbid obesity  4.  History of asthma Not on any medications.  See #1.  5.  Anemia Patient with no overt bleeding.  Hemoglobin stable at 12.4.  Transfusion threshold hemoglobin <7.  6.  OSA CPAP nightly.  7.  Leukocytosis Secondary to steroids.  No need for antibiotics at this time.  Leukocytosis trending down.  Follow.  DVT prophylaxis: Lovenox Code Status: Full Family Communication: Updated patient.  No family at bedside. Disposition:   Status is: Inpatient    Dispo: The patient is from: Home              Anticipated d/c is to: SNF              Anticipated d/c date is: 05/31/2020              Patient  currently being transitioned to oral prednisone.  On IV Lasix.  Not stable for discharge.       Consultants:   Pulmonary: Dr. Vaughan Browner 05/27/2020  Procedures:   CT angiogram chest 05/26/2020  Chest x-ray 05/26/2020  Antimicrobials:  None   Subjective: Patient sitting on bedside commode.  States breathing has improved with diuresis.  Feels cough is improving.  Feeling better than admission.  Denies any chest pain.  No  abdominal pain.  Complaining of constipation.  Good urine output per patient.  Objective: Vitals:   05/29/20 1357 05/29/20 2234 05/30/20 0646 05/30/20 0827  BP:  109/82 116/78   Pulse:  99 82   Resp:  20 20   Temp:  99.1 F (37.3 C) 98.1 F (36.7 C)   TempSrc:      SpO2: 96% 100% 100% 96%  Weight:      Height:        Intake/Output Summary (Last 24 hours) at 05/30/2020 0957 Last data filed at 05/30/2020 0500 Gross per 24 hour  Intake --  Output 2500 ml  Net -2500 ml   Filed Weights   05/26/20 1315 05/28/20 1120  Weight: 95.3 kg 97.8 kg    Examination:  General exam: NAD Respiratory system: Decreased coarse breath sounds diffusely.  Fair air movement.  No wheezing.  Speaking in full sentences. Cardiovascular system: Regular rate and rhythm no murmurs rubs or gallops.  No JVD.  No lower extremity edema. Gastrointestinal system: Abdomen is soft, nontender, nondistended, positive bowel sounds.  No rebound.  No guarding.  Central nervous system: Alert and oriented. No focal neurological deficits. Extremities: Symmetric 5 x 5 power. Skin: No rashes, lesions or ulcers Psychiatry: Judgement and insight appear normal. Mood & affect appropriate.     Data Reviewed: I have personally reviewed following labs and imaging studies  CBC: Recent Labs  Lab 05/26/20 1319 05/27/20 0433 05/28/20 0446 05/29/20 0526  WBC 10.1 8.9 15.4* 16.6*  NEUTROABS 7.2  --   --   --   HGB 11.0* 10.4* 10.6* 11.5*  HCT 35.4* 33.9* 33.9* 36.4  MCV 89.8 90.4 89.0 88.8  PLT 192 198 225 891    Basic Metabolic Panel: Recent Labs  Lab 05/26/20 1319 05/27/20 0433 05/28/20 0446 05/29/20 0526 05/30/20 0426  NA 141 139 140 140 137  K 3.9 4.3 4.7 3.8 4.8  CL 99 102 103 98 92*  CO2 _0 GLUCOSE 119* 111* 190* 150* 137*  BUN _1 27*  CREATININE 0.81 0.72 0.82 0.93 1.05*  CALCIUM 9.9 9.4 10.3 10.3 10.0  MG  --   --  2.0 2.0 1.8  PHOS  --   --  4.3 4.7*  --      GFR: Estimated Creatinine Clearance: 59.3 mL/min (A) (by C-G formula based on SCr of 1.05 mg/dL (H)).  Liver Function Tests: Recent Labs  Lab 05/28/20 0446  ALBUMIN 3.4*    CBG: No results for input(s): GLUCAP in the last 168 hours.   Recent Results (from the past 240 hour(s))  SARS Coronavirus 2 by RT PCR (hospital order, performed in [facility] hospital lab) Nasopharyngeal Nasopharyngeal Swab     Status: None   Collection Time: 05/29/20  9:00 AM   Specimen: Nasopharyngeal Swab  Result Value Ref Range Status   SARS Coronavirus 2 NEGATIVE NEGATIVE Final    Comment: (NOTE) SARS-CoV-2 target nucleic acids are NOT DETECTED.  The SARS-CoV-2 RNA is generally detectable in upper and  lower respiratory specimens during the acute phase of infection. The lowest concentration of SARS-CoV-2 viral copies this assay can detect is 250 copies / mL. A negative result does not preclude SARS-CoV-2 infection and should not be used as the sole basis for treatment or other patient management decisions.  A negative result may occur with improper specimen collection / handling, submission of specimen other than nasopharyngeal swab, presence of viral mutation(s) within the areas targeted by this assay, and inadequate number of viral copies (<250 copies / mL). A negative result must be combined with clinical observations, patient history, and epidemiological information.  Fact Sheet for Patients:   StrictlyIdeas.no  Fact Sheet for Healthcare Providers: BankingDealers.co.za  This test is not yet approved or  cleared by the [place] FDA and has been authorized for detection and/or diagnosis of SARS-CoV-2 by FDA under an Emergency Use Authorization (EUA).  This EUA will remain in effect (meaning this test can be used) for the duration of the COVID-19 declaration under Section 564(b)(1) of the Act, 21 U.S.C. section 360bbb-3(b)(1), unless the  authorization is terminated or revoked sooner.  Performed at [facility] [place] [address], [place], [place] [address]          Radiology Studies: No results found.      Scheduled Meds: . budesonide (PULMICORT) nebulizer solution  0.5 mg Nebulization BID  . enoxaparin (LOVENOX) injection  40 mg Subcutaneous Q24H  . fluticasone  2 spray Each Nare Daily  . guaiFENesin  1,200 mg Oral BID  . ipratropium-albuterol  3 mL Nebulization BID  . loratadine  10 mg Oral Daily  . pantoprazole  40 mg Oral Q0600  . polyethylene glycol  17 g Oral Daily  . predniSONE  40 mg Oral QAC breakfast   Continuous Infusions: . magnesium sulfate bolus IVPB       LOS: 3 days    Time spent: 35 minutes    Irine Seal, MD [facility]   To contact the attending provider between 7A-7P or the covering provider during after hours 7P-7A, please log into the web site [email or web address] and access using universal [place] password for that web site. If you do not have the password, please call the hospital operator.  05/30/2020, 9:57 AM

## 2020-05-31 DIAGNOSIS — Z8616 Personal history of covid-19: Secondary | ICD-10-CM | POA: Diagnosis not present

## 2020-05-31 DIAGNOSIS — J9601 Acute respiratory failure with hypoxia: Secondary | ICD-10-CM | POA: Diagnosis not present

## 2020-05-31 DIAGNOSIS — R030 Elevated blood-pressure reading, without diagnosis of hypertension: Secondary | ICD-10-CM | POA: Diagnosis not present

## 2020-05-31 DIAGNOSIS — D649 Anemia, unspecified: Secondary | ICD-10-CM | POA: Diagnosis not present

## 2020-05-31 LAB — CBC WITH DIFFERENTIAL/PLATELET
Abs Immature Granulocytes: 0.17 10*3/uL — ABNORMAL HIGH (ref 0.00–0.07)
Basophils Absolute: 0 10*3/uL (ref 0.0–0.1)
Basophils Relative: 0 %
Eosinophils Absolute: 0.2 10*3/uL (ref 0.0–0.5)
Eosinophils Relative: 2 %
HCT: 36 % (ref 36.0–46.0)
Hemoglobin: 11.5 g/dL — ABNORMAL LOW (ref 12.0–15.0)
Immature Granulocytes: 1 %
Lymphocytes Relative: 13 %
Lymphs Abs: 2 10*3/uL (ref 0.7–4.0)
MCH: 28.2 pg (ref 26.0–34.0)
MCHC: 31.9 g/dL (ref 30.0–36.0)
MCV: 88.2 fL (ref 80.0–100.0)
Monocytes Absolute: 1.2 10*3/uL — ABNORMAL HIGH (ref 0.1–1.0)
Monocytes Relative: 8 %
Neutro Abs: 12 10*3/uL — ABNORMAL HIGH (ref 1.7–7.7)
Neutrophils Relative %: 76 %
Platelets: 237 10*3/uL (ref 150–400)
RBC: 4.08 MIL/uL (ref 3.87–5.11)
RDW: 16.2 % — ABNORMAL HIGH (ref 11.5–15.5)
WBC: 15.7 10*3/uL — ABNORMAL HIGH (ref 4.0–10.5)
nRBC: 0 % (ref 0.0–0.2)

## 2020-05-31 LAB — BASIC METABOLIC PANEL
Anion gap: 12 (ref 5–15)
BUN: 26 mg/dL — ABNORMAL HIGH (ref 8–23)
CO2: 30 mmol/L (ref 22–32)
Calcium: 9.8 mg/dL (ref 8.9–10.3)
Chloride: 95 mmol/L — ABNORMAL LOW (ref 98–111)
Creatinine, Ser: 0.77 mg/dL (ref 0.44–1.00)
GFR, Estimated: >60 mL/min (ref >60)
Glucose, Bld: 147 mg/dL — ABNORMAL HIGH (ref 70–99)
Potassium: 4.3 mmol/L (ref 3.5–5.1)
Sodium: 137 mmol/L (ref 135–145)

## 2020-05-31 LAB — MAGNESIUM: Magnesium: 2.3 mg/dL (ref 1.7–2.4)

## 2020-05-31 MED ORDER — BUDESONIDE-FORMOTEROL FUMARATE 160-4.5 MCG/ACT IN AERO: 2.0000 | INHALATION_SPRAY | Freq: Two times a day (BID) | RESPIRATORY_TRACT | 12 refills | Status: DC

## 2020-05-31 MED ORDER — POLYETHYLENE GLYCOL 3350 17 G PO PACK: 17.0000 g | PACK | Freq: Every day | ORAL | 0 refills | Status: DC

## 2020-05-31 MED ORDER — FLUTICASONE PROPIONATE 50 MCG/ACT NA SUSP: 2.0000 | Freq: Every day | NASAL | 2 refills | Status: AC

## 2020-05-31 MED ORDER — IPRATROPIUM-ALBUTEROL 0.5-2.5 (3) MG/3ML IN SOLN: 3.0000 mL | RESPIRATORY_TRACT | Status: DC | PRN

## 2020-05-31 MED ORDER — HYDROCODONE-HOMATROPINE 5-1.5 MG/5ML PO SYRP
5.0000 mL | ORAL_SOLUTION | Freq: Four times a day (QID) | ORAL | 0 refills | Status: DC | PRN
Start: 2020-05-31 — End: 2020-09-27

## 2020-05-31 MED ORDER — LORATADINE 10 MG PO TABS: 10.0000 mg | ORAL_TABLET | Freq: Every day | ORAL | Status: DC

## 2020-05-31 MED ORDER — PREDNISONE 10 MG PO TABS
10.0000 mg | ORAL_TABLET | Freq: Every day | ORAL | 1 refills | Status: DC
Start: 2020-06-01 — End: 2020-09-23

## 2020-05-31 MED ORDER — IPRATROPIUM-ALBUTEROL 0.5-2.5 (3) MG/3ML IN SOLN: 3.0000 mL | Freq: Two times a day (BID) | RESPIRATORY_TRACT | 2 refills | Status: DC

## 2020-05-31 MED ORDER — PANTOPRAZOLE SODIUM 40 MG PO TBEC
40.0000 mg | DELAYED_RELEASE_TABLET | Freq: Every day | ORAL | 2 refills | Status: DC
Start: 2020-06-01 — End: 2023-12-19

## 2020-05-31 MED ORDER — SENNOSIDES-DOCUSATE SODIUM 8.6-50 MG PO TABS: 1.0000 | ORAL_TABLET | Freq: Two times a day (BID) | ORAL | Status: DC

## 2020-05-31 MED ORDER — GUAIFENESIN ER 600 MG PO TB12: 1200.0000 mg | ORAL_TABLET | Freq: Two times a day (BID) | ORAL | 0 refills | Status: AC

## 2020-05-31 NOTE — Progress Notes (Signed)
Physical Therapy Treatment Patient Details Name: Tiffany Coleman MRN: [id number] DOB: [birth year] Today's Date: 05/31/2020    History of Present Illness 64 y.o. female with a history of morbid obesity, OSA not on CPAP, GAD, GERD, and recent covid-19 diagnosis at PCP office and had previous admission for Acute hypoxemic respiratory failure due to COVID-19 pneumonia.  Pt admitted back 05/26/20 with hypoxic respiratory failure.  No PE on CTA but imaging shows extensive bilateral infiltrates    PT Comments    Pt assisted with ambulating in hallway.  Pt continues to require at least 6L O2 [place] for exertional tasks as well as cues for rest breaks and pursed lip breathing.  Continue to recommend SNF upon d/c.   Follow Up Recommendations  SNF     Equipment Recommendations  None recommended by PT    Recommendations for Other Services       Precautions / Restrictions Precautions Precautions: Fall Precaution Comments: monitor O2 sats and HR    Mobility  Bed Mobility Overal bed mobility: Needs Assistance Bed Mobility: Sit to Supine     Supine to sit: Supervision;HOB elevated        Transfers Overall transfer level: Needs assistance Equipment used: Rolling walker (2 wheeled) Transfers: Sit to/from Stand Sit to Stand: Min guard         General transfer comment: min/guard for safety, pt on 5L O2 on arrival and SPO2 94% (rest)  Ambulation/Gait Ambulation/Gait assistance: Min guard Gait Distance (Feet): 200 Feet Assistive device: Rolling walker (2 wheeled) Gait Pattern/deviations: Step-through pattern;Decreased stride length     General Gait Details: SpO2 dropped to 86% on 5L O2 [place], applied 6L and SpO2 85-97% during ambulation, cues for standing rest breaks with pursed lip breathing, required at least 6 standing rest breaks   Stairs             Wheelchair Mobility    Modified Rankin (Stroke Patients Only)       Balance                                             Cognition Arousal/Alertness: Awake/alert Behavior During Therapy: WFL for tasks assessed/performed Overall Cognitive Status: Within Functional Limits for tasks assessed                                        Exercises      General Comments        Pertinent Vitals/Pain Pain Assessment: Faces Faces Pain Scale: Hurts little more Pain Location: lower back Pain Descriptors / Indicators: Sore Pain Intervention(s): Repositioned;Monitored during session    Home Living                      Prior Function            PT Goals (current goals can now be found in the care plan section) Progress towards PT goals: Progressing toward goals    Frequency    Min 2X/week      PT Plan Current plan remains appropriate    Co-evaluation              AM-PAC PT "6 Clicks" Mobility   Outcome Measure  Help needed turning from your back to your side while in a flat bed  without using bedrails?: None Help needed moving from lying on your back to sitting on the side of a flat bed without using bedrails?: A Little Help needed moving to and from a bed to a chair (including a wheelchair)?: A Little Help needed standing up from a chair using your arms (e.g., wheelchair or bedside chair)?: A Little Help needed to walk in hospital room?: A Little Help needed climbing 3-5 steps with a railing? : A Little 6 Click Score: 19    End of Session Equipment Utilized During Treatment: Oxygen Activity Tolerance: Patient tolerated treatment well Patient left: with call bell/phone within reach;in bed;with bed alarm set Nurse Communication: Mobility status PT Visit Diagnosis: Difficulty in walking, not elsewhere classified (R26.2)     Time: 2178-3754 PT Time Calculation (min) (ACUTE ONLY): 17 min  Charges:  $Gait Training: 8-22 mins                     Arlyce Dice, DPT Acute Rehabilitation Services Pager: [phone] Office:  [phone]  [place] Ram E 05/31/2020, 11:38 AM

## 2020-05-31 NOTE — TOC Progression Note (Signed)
Transition of Care [facility]) - Progression Note    Patient Details  Name: Tiffany Coleman MRN: [id number] Date of Birth: 1955/09/27  [facility]) CM/SW Contact  Purcell Mouton, RN Phone Number: 05/31/2020, 11:10 AM  Clinical Narrative:    Tiffany Coleman was called to transport pt to [facility]. RN/NT aware.    Expected Discharge Plan: [place] Barriers to Discharge: No Barriers Identified  Expected Discharge Plan and Services Expected Discharge Plan: [place] arrangements for the past 2 months: Single Family Home Expected Discharge Date: 05/31/20                                     Social Determinants of Health (SDOH) Interventions    Readmission Risk Interventions No flowsheet data found.

## 2020-05-31 NOTE — Discharge Summary (Signed)
Physician Discharge Summary  Tiffany Coleman YQM:[id number] DOB: 1956/03/12 DOA: 05/26/2020  PCP: Tiffany Ebbs, MD  Admit date: 05/26/2020 Discharge date: 05/31/2020  Time spent: 50 minutes  Recommendations for Outpatient Follow-up:  1. Patient was discharged to skilled nursing facility on oxygen 5 L nasal cannula. 2. Follow-up with Dr. Vaughan Coleman, pulmonary 06/18/2020 at 8:30 AM for follow-up on post Covid pneumonitis.   Discharge Diagnoses:  Principal Problem:   Acute respiratory failure with hypoxia (HCC) Active Problems:   Pneumonitis: Post Covid pneumonitis   Morbid obesity ([place])   Elevated BP without diagnosis of hypertension   Acute respiratory failure with hypoxemia (HCC)   Anemia   Asthma   History of COVID-19   Discharge Condition: Stable and improved  Diet recommendation: Heart healthy  Filed Weights   05/26/20 1315 05/28/20 1120  Weight: 95.3 kg 97.8 kg    History of present illness:  HPI per Dr. Hilda Coleman Tiffany Coleman is a 64 y.o. female with history of asthma presently not on any medications who was recently admitted for COVID-19 infection resulting in respiratory failure and had extensive hospital stay was discharged home on May 11, 2020 about 3 weeks ago on 5 L oxygen presented to the ER because of persistent shortness of breath since discharge.  Patient stated she gets short of breath with minimal movement.  Has been having some cough and chest tightness.  Denied any fever chills.  Has not noticed any leg swelling.  ED Course: In the ER patient was hypoxic requiring 6 L oxygen and CT angiogram of the chest was negative for PE did show bilateral infiltrates consistent with sequela Covid infection.  EKG was showing normal sinus rhythm BNP was unremarkable I sent his troponins were negative.  Hemoglobin was around 11.  Patient admitted for acute respiratory failure with recent Covid infection.  Hospital Course:  1 acute respiratory  failure with hypoxia secondary to post Covid pneumonitis. Patient presented with shortness of breath on minimal exertion.  Patient recently hospitalized for COVID-19 infection.  CT angiogram chest done negative for PE however showed bilateral infiltrates consistent with sequelae of Covid infection.  Patient noted on presentation to have some mild expiratory wheezing.  Patient placed on IV steroids as well as albuterol MDIs.  Patient started on IV Lasix with clinical improvement and good diuresis.   Patient was -7.56 L during this hospitalization.  Patient improved clinically.  Transitioned from IV Solu-Medrol to oral prednisone 40 mg daily with slow taper per pulmonary recommendations.  IV Lasix subsequently discontinued.  Patient maintained on Pulmicort, Mucinex, Claritin, Flonase, PPI, Hycodan as needed.  Patient also maintained on pulmonary toilet with incentive spirometry and flutter valve. Patient was assessed by PCCM who are recommended continuation of supplemental oxygen, prolonged slow steroid taper, follow-up pain pulmonary clinic for monitoring for development of post Covid fibrosis. Procalcitonin levels were negative and as such no need for antibiotics.  Patient improved clinically and will be discharged to skilled nursing facility on home O2.  Outpatient follow-up with pulmonary 06/18/2020.  2.  Elevated blood pressure reading Patient noted not to be on any antihypertensives prior to admission.  Patient placed on IV Lasix secondary to problem #1.  Improvement with blood pressure.    Patient maintained on hydralazine as needed.  Outpatient follow-up.   3.  Morbid obesity  4.  History of asthma Not on any medications.  See #1.  5.  Anemia Patient with no overt bleeding.  Hemoglobin at 11.5 by day of  discharge.  Outpatient follow-up.   6.  OSA CPAP nightly.  7.  Leukocytosis Secondary to steroids.  No need for antibiotics at this time.  Leukocytosis trended down.  Outpatient  follow-up.    Procedures:  CT angiogram chest 05/26/2020  Chest x-ray 05/26/2020   Consultations:  Pulmonary: Dr. Vaughan Coleman 05/27/2020  Discharge Exam: Vitals:   05/31/20 0633 05/31/20 0904  BP: 112/74   Pulse: 88   Resp: 20   Temp: 98.8 F (37.1 C)   SpO2: 100% 98%    General: NAD Cardiovascular: RRR Respiratory: Some scattered coarse breath sounds.  Discharge Instructions   Discharge Instructions    Diet - low sodium heart healthy   Complete by: As directed    Increase activity slowly   Complete by: As directed      Allergies as of 05/31/2020      Reactions   Latex Hives, Itching   Penicillins Hives, Itching, Other (See Comments)   Has patient had a PCN reaction causing immediate rash, facial/tongue/throat swelling, SOB or lightheadedness with hypotension: Yes Has patient had a PCN reaction causing severe rash involving mucus membranes or skin necrosis: Unk Has patient had a PCN reaction that required hospitalization: Unk Has patient had a PCN reaction occurring within the last 10 years: No If all of the above answers are "NO", then may proceed with Cephalosporin use.   Tape Hives, Itching, Other (See Comments)   Coban wrap, in particular   Codeine Itching      Medication List    TAKE these medications   acetaminophen 325 MG tablet Commonly known as: TYLENOL Take 2 tablets (650 mg total) by mouth every 6 (six) hours as needed for mild pain or headache (fever >/= 101). Notes to patient: Do not take more than 4000 mg total daily dose of Tylenol from all meds that contain Tylenol    albuterol 108 (90 Base) MCG/ACT inhaler Commonly known as: VENTOLIN HFA Inhale 2 puffs into the lungs every 6 (six) hours as needed for wheezing or shortness of breath. What changed: Another medication with the same name was removed. Continue taking this medication, and follow the directions you see here.   budesonide-formoterol 160-4.5 MCG/ACT inhaler Commonly known as:  SYMBICORT Inhale 2 puffs into the lungs 2 (two) times daily. What changed: when to take this   feeding supplement ([employer] STANDARD 1.4) Liqd liquid Take 325 mLs by mouth daily.   fluticasone 50 MCG/ACT nasal spray Commonly known as: FLONASE Place 2 sprays into both nostrils daily. Start taking on: June 01, 2020   gabapentin 300 MG capsule Commonly known as: NEURONTIN Take 300 mg by mouth 3 (three) times daily as needed (nerve pain).   guaiFENesin 600 MG 12 hr tablet Commonly known as: MUCINEX Take 2 tablets (1,200 mg total) by mouth 2 (two) times daily for 5 days.   guaiFENesin-dextromethorphan 100-10 MG/5ML syrup Commonly known as: ROBITUSSIN DM Take 5 mLs by mouth every 6 (six) hours as needed for cough.   HYDROcodone-homatropine 5-1.5 MG/5ML syrup Commonly known as: HYCODAN Take 5 mLs by mouth every 6 (six) hours as needed for cough.   ipratropium-albuterol 0.5-2.5 (3) MG/3ML Soln Commonly known as: DUONEB Take 3 mLs by nebulization every 2 (two) hours as needed.   ipratropium-albuterol 0.5-2.5 (3) MG/3ML Soln Commonly known as: DUONEB Take 3 mLs by nebulization 2 (two) times daily.   loratadine 10 MG tablet Commonly known as: CLARITIN Take 1 tablet (10 mg total) by mouth daily. Start taking on:  June 01, 2020   pantoprazole 40 MG tablet Commonly known as: PROTONIX Take 1 tablet (40 mg total) by mouth daily at 6 (six) AM. Start taking on: June 01, 2020   polyethylene glycol 17 g packet Commonly known as: MIRALAX / GLYCOLAX Take 17 g by mouth daily. Start taking on: June 01, 2020   predniSONE 10 MG tablet Commonly known as: DELTASONE Take 1-4 tablets (10-40 mg total) by mouth daily before breakfast. Take 40 mg daily x14 days, then 30 mg daily x14 days, then 20 mg daily x14 days, then 10 mg daily. Start taking on: June 01, 2020 What changed:   how much to take  how to take this  when to take this  additional instructions    senna-docusate 8.6-50 MG tablet Commonly known as: Senokot-S Take 1 tablet by mouth 2 (two) times daily.   zolpidem 10 MG tablet Commonly known as: AMBIEN Take 10 mg by mouth at bedtime as needed for sleep.      Allergies  Allergen Reactions  . Latex Hives and Itching  . Penicillins Hives, Itching and Other (See Comments)    Has patient had a PCN reaction causing immediate rash, facial/tongue/throat swelling, SOB or lightheadedness with hypotension: Yes Has patient had a PCN reaction causing severe rash involving mucus membranes or skin necrosis: Unk Has patient had a PCN reaction that required hospitalization: Unk Has patient had a PCN reaction occurring within the last 10 years: No If all of the above answers are "NO", then may proceed with Cephalosporin use.   . Tape Hives, Itching and Other (See Comments)    Coban wrap, in particular  . Codeine Itching    Follow-up Information    MD AT [facility] Follow up.        Marshell Garfinkel, MD Follow up on 06/18/2020.   Specialty: Pulmonary Disease Why: F/U at 830am Contact information: [address] [place] [address] [phone]                The results of significant diagnostics from this hospitalization (including imaging, microbiology, ancillary and laboratory) are listed below for reference.    Significant Diagnostic Studies: DG Chest 1 View  Result Date: 05/06/2020 CLINICAL DATA:  COVID.  Syncope. EXAM: CHEST  1 VIEW COMPARISON:  04/22/2020 FINDINGS: Patchy bilateral airspace opacities again noted, stable since prior study. Low lung volumes. Heart is borderline in size. No visible effusions or pneumothorax. IMPRESSION: Extensive patchy bilateral airspace opacities, not significantly changed since prior study. Electronically Signed   By: Rolm Baptise M.D.   On: 05/06/2020 10:04   DG Chest 2 View  Result Date: 05/26/2020 CLINICAL DATA:  Hypoxia. EXAM: CHEST - 2 VIEW COMPARISON:  Chest x-ray dated  May 06, 2020. FINDINGS: Unchanged mild cardiomegaly. Normal pulmonary vascularity. Patchy bilateral airspace opacities are not significantly changed. No pneumothorax or large pleural effusion. No acute osseous abnormality. IMPRESSION: 1. Unchanged patchy bilateral airspace disease. Electronically Signed   By: Titus Dubin M.D.   On: 05/26/2020 14:40   CT Angio Chest PE W and/or Wo Contrast  Result Date: 05/26/2020 CLINICAL DATA:  64 year old female with recent COVID. Concern for pulmonary embolism. EXAM: CT ANGIOGRAPHY CHEST WITH CONTRAST TECHNIQUE: Multidetector CT imaging of the chest was performed using the standard protocol during bolus administration of intravenous contrast. Multiplanar CT image reconstructions and MIPs were obtained to evaluate the vascular anatomy. CONTRAST:  58m OMNIPAQUE IOHEXOL 350 MG/ML SOLN COMPARISON:  Chest CT dated 04/05/2020. FINDINGS: Cardiovascular:  Borderline cardiomegaly. No pericardial effusion. The thoracic aorta is unremarkable. The origins of the great vessels of the aortic arch appear patent. Evaluation of the pulmonary arteries is very limited due to respiratory motion artifact. No large or central pulmonary artery embolus identified. Mediastinum/Nodes: Top-normal hilar and mediastinal lymph nodes, likely reactive. The esophagus and the thyroid gland are grossly unremarkable. No mediastinal fluid collection. Lungs/Pleura: Diffuse bilateral interstitial and streaky densities as well as ground-glass airspace opacities primarily involving the lung bases in keeping with sequela of COVID-19 pneumonia. Overall improved aeration of the lungs compared to prior CT. There is no pleural effusion pneumothorax. The central airways are patent. Upper Abdomen: No acute abnormality. Musculoskeletal: Degenerative changes of the spine. No acute osseous pathology. Review of the MIP images confirms the above findings. IMPRESSION: 1. No CT evidence of central pulmonary artery  embolus. 2. Diffuse bilateral interstitial and streaky densities as well as ground-glass airspace opacities primarily involving the lung bases in keeping with sequela of COVID-19 pneumonia. Overall improved aeration of the lungs compared to prior CT. Electronically Signed   By: Anner Crete M.D.   On: 05/26/2020 20:20    Microbiology: Recent Results (from the past 240 hour(s))  SARS Coronavirus 2 by RT PCR (hospital order, performed in [facility] hospital lab) Nasopharyngeal Nasopharyngeal Swab     Status: None   Collection Time: 05/29/20  9:00 AM   Specimen: Nasopharyngeal Swab  Result Value Ref Range Status   SARS Coronavirus 2 NEGATIVE NEGATIVE Final    Comment: (NOTE) SARS-CoV-2 target nucleic acids are NOT DETECTED.  The SARS-CoV-2 RNA is generally detectable in upper and lower respiratory specimens during the acute phase of infection. The lowest concentration of SARS-CoV-2 viral copies this assay can detect is 250 copies / mL. A negative result does not preclude SARS-CoV-2 infection and should not be used as the sole basis for treatment or other patient management decisions.  A negative result may occur with improper specimen collection / handling, submission of specimen other than nasopharyngeal swab, presence of viral mutation(s) within the areas targeted by this assay, and inadequate number of viral copies (<250 copies / mL). A negative result must be combined with clinical observations, patient history, and epidemiological information.  Fact Sheet for Patients:   StrictlyIdeas.no  Fact Sheet for Healthcare Providers: BankingDealers.co.za  This test is not yet approved or  cleared by the [place] FDA and has been authorized for detection and/or diagnosis of SARS-CoV-2 by FDA under an Emergency Use Authorization (EUA).  This EUA will remain in effect (meaning this test can be used) for the duration of the COVID-19  declaration under Section 564(b)(1) of the Act, 21 U.S.C. section 360bbb-3(b)(1), unless the authorization is terminated or revoked sooner.  Performed at [facility], [place] [address] [place], [place] [address]      Labs: Basic Metabolic Panel: Recent Labs  Lab 05/27/20 0433 05/28/20 0446 05/29/20 0526 05/30/20 0426 05/31/20 0425  NA 139 140 140 137 137  K 4.3 4.7 3.8 4.8 4.3  CL 102 103 98 92* 95*  CO2 _0 GLUCOSE 111* 190* 150* 137* 147*  BUN _1 27* 26*  CREATININE 0.72 0.82 0.93 1.05* 0.77  CALCIUM 9.4 10.3 10.3 10.0 9.8  MG  --  2.0 2.0 1.8 2.3  PHOS  --  4.3 4.7*  --   --    Liver Function Tests: Recent Labs  Lab 05/28/20 0446  ALBUMIN 3.4*  No results for input(s): LIPASE, AMYLASE in the last 168 hours. No results for input(s): AMMONIA in the last 168 hours. CBC: Recent Labs  Lab 05/26/20 1319 05/26/20 1319 05/27/20 0433 05/28/20 0446 05/29/20 0526 05/30/20 0826 05/31/20 0425  WBC 10.1   < > 8.9 15.4* 16.6* 14.9* 15.7*  NEUTROABS 7.2  --   --   --   --  10.6* 12.0*  HGB 11.0*   < > 10.4* 10.6* 11.5* 12.4 11.5*  HCT 35.4*   < > 33.9* 33.9* 36.4 39.8 36.0  MCV 89.8   < > 90.4 89.0 88.8 89.2 88.2  PLT 192   < > 198 225 231 259 237   < > = values in this interval not displayed.   Cardiac Enzymes: No results for input(s): CKTOTAL, CKMB, CKMBINDEX, TROPONINI in the last 168 hours. BNP: BNP (last 3 results) Recent Labs    04/24/20 0531 05/26/20 1319  BNP 134.6* 45.7    ProBNP (last 3 results) No results for input(s): PROBNP in the last 8760 hours.  CBG: No results for input(s): GLUCAP in the last 168 hours.     Signed:  Irine Seal MD.  [facility] 05/31/2020, 10:43 AM

## 2020-05-31 NOTE — Progress Notes (Signed)
Pt is discharged via EMS at this time to rehab, noted. Pt is A/O times 4, V.s stable and PIV removed and placed on oxygen via [place] 5L.

## 2020-05-31 NOTE — Care Management Important Message (Signed)
Important Message  Patient Details IM Letter given to the Patient Name: Tiffany Coleman MRN: [id number] Date of Birth: 1955/12/30   Medicare Important Message Given:  Yes     Kerin Salen 05/31/2020, 10:25 AM

## 2020-05-31 NOTE — Progress Notes (Signed)
Report giving to Magda Paganini, Therapist, sports at [employer], noted.

## 2020-05-31 NOTE — Plan of Care (Signed)
  Problem: Education: Goal: Knowledge of General Education information will improve Description: Including pain rating scale, medication(s)/side effects and non-pharmacologic comfort measures Outcome: Completed/Met   Problem: Health Behavior/Discharge Planning: Goal: Ability to manage health-related needs will improve Outcome: Completed/Met   Problem: Clinical Measurements: Goal: Ability to maintain clinical measurements within normal limits will improve Outcome: Completed/Met Goal: Will remain free from infection Outcome: Completed/Met Goal: Diagnostic test results will improve Outcome: Completed/Met Goal: Respiratory complications will improve Outcome: Completed/Met Goal: Cardiovascular complication will be avoided Outcome: Completed/Met   Problem: Activity: Goal: Risk for activity intolerance will decrease Outcome: Completed/Met   Problem: Nutrition: Goal: Adequate nutrition will be maintained Outcome: Completed/Met  No s/s of sepsis at this time.  Problem: Coping: Goal: Level of anxiety will decrease Outcome: Completed/Met   Problem: Elimination: Goal: Will not experience complications related to bowel motility Outcome: Completed/Met Goal: Will not experience complications related to urinary retention Outcome: Completed/Met   Problem: Pain Managment: Goal: General experience of comfort will improve Outcome: Completed/Met   Problem: Safety: Goal: Ability to remain free from injury will improve Outcome: Completed/Met   Problem: Skin Integrity: Goal: Risk for impaired skin integrity will decrease Outcome: Completed/Met

## 2020-06-18 ENCOUNTER — Encounter: Payer: Self-pay | Admitting: Pulmonary Disease

## 2020-06-18 ENCOUNTER — Other Ambulatory Visit: Payer: Self-pay

## 2020-06-18 ENCOUNTER — Ambulatory Visit (INDEPENDENT_AMBULATORY_CARE_PROVIDER_SITE_OTHER): Payer: Medicare Other | Admitting: Pulmonary Disease

## 2020-06-18 VITALS — BP 140/88 | HR 103 | Temp 98.3°F | Ht 63.5 in | Wt 228.6 lb

## 2020-06-18 DIAGNOSIS — Z8616 Personal history of COVID-19: Secondary | ICD-10-CM | POA: Diagnosis not present

## 2020-06-18 DIAGNOSIS — R0602 Shortness of breath: Secondary | ICD-10-CM | POA: Diagnosis not present

## 2020-06-18 MED ORDER — SULFAMETHOXAZOLE-TRIMETHOPRIM 800-160 MG PO TABS: 1.0000 | ORAL_TABLET | ORAL | 2 refills | Status: DC

## 2020-06-18 NOTE — Progress Notes (Signed)
Tiffany Coleman    [id number]    1956-07-13  Primary Care Physician:Avbuere, Christean Grief, MD  Referring Physician: Nolene Ebbs, [address] [place],  [place] [id number]  Chief complaint: Follow-up for asthma mild, post COVID-51  HPI: 64 year old with history of asthma, morbid obesity, OSA  Recent hospitalization for COVID-19 infection with prolonged hospital stay and discharged on 05/11/20 She was readmitted back to [facility] long hospital on 10/13 with hypoxic respiratory failure.  No PE on CTA but imaging shows extensive bilateral infiltrates. 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 She has since resumed it after Education officer, museum noticed the discrepancy and she is now at 20 mg a day. 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 No mold, hot tub, Jacuzzi Smoking history: Never smoker Travel history: Originally from [place]. No significant recent travel Relevant family history: No significant family issue of lung disease.   Outpatient Encounter Medications as of 06/18/2020  Medication Sig  . acetaminophen (TYLENOL) 325 MG tablet Take 2 tablets (650 mg total) by mouth every 6 (six) hours as needed for mild pain or headache (fever >/= 101).  Marland Kitchen albuterol (PROVENTIL HFA;VENTOLIN HFA) 108 (90 Base) MCG/ACT inhaler Inhale 2 puffs into the lungs every 6 (six) hours as needed for wheezing or shortness of breath.  . budesonide-formoterol (SYMBICORT) 160-4.5 MCG/ACT inhaler Inhale 2 puffs into the lungs 2 (two) times daily.  . fluticasone (FLONASE) 50 MCG/ACT nasal spray Place 2 sprays into both nostrils daily.  Marland Kitchen HYDROcodone-homatropine (HYCODAN) 5-1.5 MG/5ML syrup Take 5 mLs by mouth every 6 (six) hours as  needed for cough.  Marland Kitchen ipratropium-albuterol (DUONEB) 0.5-2.5 (3) MG/3ML SOLN Take 3 mLs by nebulization every 2 (two) hours as needed.  . pantoprazole (PROTONIX) 40 MG tablet Take 1 tablet (40 mg total) by mouth daily at 6 (six) AM.  . polyethylene glycol (MIRALAX / GLYCOLAX) 17 g packet Take 17 g by mouth daily.  . predniSONE (DELTASONE) 10 MG tablet Take 1-4 tablets (10-40 mg total) by mouth daily before breakfast. Take 40 mg daily x14 days, then 30 mg daily x14 days, then 20 mg daily x14 days, then 10 mg daily.  Marland Kitchen senna-docusate (SENOKOT-S) 8.6-50 MG tablet Take 1 tablet by mouth 2 (two) times daily.  Marland Kitchen zolpidem (AMBIEN) 10 MG tablet Take 10 mg by mouth at bedtime as needed for sleep.   Marland Kitchen gabapentin (NEURONTIN) 300 MG capsule Take 300 mg by mouth 3 (three) times daily as needed (nerve pain).  (Patient not taking: Reported on 06/18/2020)  . guaiFENesin-dextromethorphan (ROBITUSSIN DM) 100-10 MG/5ML syrup Take 5 mLs by mouth every 6 (six) hours as needed for cough. (Patient not taking: Reported on 06/18/2020)  . loratadine (CLARITIN) 10 MG tablet Take 1 tablet (10 mg total) by mouth daily. (Patient not taking: Reported on 06/18/2020)  . [DISCONTINUED] ipratropium-albuterol (DUONEB) 0.5-2.5 (3) MG/3ML SOLN Take 3 mLs by nebulization 2 (two) times daily.   No facility-administered encounter medications on file as of 06/18/2020.    Allergies as of 06/18/2020 - Review Complete 06/18/2020  Allergen Reaction Noted  . Latex Hives and Itching 08/18/2011  . Penicillins Hives, Itching, and Other (See Comments) 03/02/2013  . Tape Hives, Itching, and Other (See  Comments) 10/12/2016  . Codeine Itching 03/02/2013    Past Medical History:  Diagnosis Date  . Anemia yrs ago  . Arthritis    knees  . Bronchitis    hx  . Complication of anesthesia    trouble breathing when wake up needs breathing tx when wakes up  . Hx of colonic polyps 2015  . Obstructive sleep apnea 06/02/2016   No CPAP used  .  Recurrent ventral hernia 09/08/2013  . Umbilical hernia     Past Surgical History:  Procedure Laterality Date  . ABDOMINAL HYSTERECTOMY     partial  . BREAST SURGERY Bilateral    cyst removed  . COLON SURGERY    . COLONOSCOPY WITH PROPOFOL N/A 05/29/2017   Procedure: COLONOSCOPY WITH PROPOFOL;  Surgeon: Wilford Corner, MD;  Location: [facility] END[facility]COPY;  Service: Endoscopy;  Laterality: N/A;  . FINGER SURGERY     right pinkie x 3  . HERNIA REPAIR    . KNEE SURGERY Right 2017  . PARTIAL COLECTOMY Left ~2004   for obstruction  . right foot surgery  yrs ago  . stomach tumur     [id number] benign    Family History  Problem Relation Age of Onset  . Deep vein thrombosis Mother   . Diabetes Mother   . Hypertension Mother   . Varicose Veins Mother   . Cancer Father        colon  . Hypertension Sister   . Hyperlipidemia Brother   . Hypertension Daughter   . Heart attack Son   . Cancer Maternal Uncle     Social History   Socioeconomic History  . Marital status: Single    Spouse name: Not on file  . Number of children: Not on file  . Years of education: Not on file  . Highest education level: Not on file  Occupational History  . Not on file  Tobacco Use  . Smoking status: Former Smoker    Years: 20.00    Types: Cigarettes    Quit date: 05/26/2020    Years since quitting: 0.0  . Smokeless tobacco: Never Used  . Tobacco comment: smokes 2-3 cigs per day   Vaping Use  . Vaping Use: Never used  Substance and Sexual Activity  . Alcohol use: Yes    Comment: Occ  . Drug use: No  . Sexual activity: Not Currently  Other Topics Concern  . Not on file  Social History Narrative  . Not on file   Social Determinants of Health   Financial Resource Strain:   . Difficulty of Paying Living Expenses: Not on file  Food Insecurity:   . Worried About Charity fundraiser in the Last Year: Not on file  . Ran Out of Food in the Last Year: Not on file  Transportation Needs:   .  Lack of Transportation (Medical): Not on file  . Lack of Transportation (Non-Medical): Not on file  Physical Activity:   . Days of Exercise per Week: Not on file  . Minutes of Exercise per Session: Not on file  Stress:   . Feeling of Stress : Not on file  Social Connections:   . Frequency of Communication with Friends and Family: Not on file  . Frequency of Social Gatherings with Friends and Family: Not on file  . Attends Religious Services: Not on file  . Active Member of Clubs or Organizations: Not on file  . Attends Archivist Meetings: Not on file  .  Marital Status: Not on file  Intimate Partner Violence:   . Fear of Current or Ex-Partner: Not on file  . Emotionally Abused: Not on file  . Physically Abused: Not on file  . Sexually Abused: Not on file    Review of systems: Review of Systems  Constitutional: Negative for fever and chills.  HENT: Negative.   Eyes: Negative for blurred vision.  Respiratory: as per HPI  Cardiovascular: Negative for chest pain and palpitations.  Gastrointestinal: Negative for vomiting, diarrhea, blood per rectum. Genitourinary: Negative for dysuria, urgency, frequency and hematuria.  Musculoskeletal: Negative for myalgias, back pain and joint pain.  Skin: Negative for itching and rash.  Neurological: Negative for dizziness, tremors, focal weakness, seizures and loss of consciousness.  Endo/Heme/Allergies: Negative for environmental allergies.  Psychiatric/Behavioral: Negative for depression, suicidal ideas and hallucinations.  All other systems reviewed and are negative.  Physical Exam: Blood pressure 140/88, pulse (!) 103, temperature 98.3 F (36.8 C), temperature source Skin, height 5' 3.5" (1.613 m), weight 228 lb 9.6 oz (103.7 kg), SpO2 97 %. Gen:      No acute distress HEENT:  EOMI, sclera anicteric Neck:     No masses; no thyromegaly Lungs:    Clear to auscultation bilaterally; normal respiratory effort CV:         Regular  rate and rhythm; no murmurs Abd:      + bowel sounds; soft, non-tender; no palpable masses, no distension Ext:    No edema; adequate peripheral perfusion Skin:      Warm and dry; no rash Neuro: alert and oriented x 3 Psych: normal mood and affect  Data Reviewed: Imaging: CTA 05/26/2020-no PE, diffuse bilateral interstitial infiltrates and groundglass opacities.  Overall improved compared to prior CT.  PFTs:  Labs:  Assessment:  Post Covid pneumonitis Currently on steroids at 20 mg. We'll continue this for now as she did not tolerate coming off it earlier this week Reassess in 1 month with PFTs, 6-minute walk test and if she is doing better then initiate a slow taper Start Bactrim for PJP prophylaxis in the meantime She'll need high-res CT in 3 months for follow-up.  OSA Intolerant of CPAP and is not interested in retrying  Plan/Recommendations: Continue prednisone at 20 mg Start Bactrim PFTs, 6-minute walk test  Marshell Garfinkel MD [facility] 06/18/2020, 8:55 AM  CC: Nolene Ebbs, MD

## 2020-06-18 NOTE — Patient Instructions (Addendum)
I am glad you are starting to feel better Continue supplemental oxygen Continue prednisone at 20 mg.  We will send in a prescription for that Start Bactrim double strength 1 tablet 3 times a week  We will schedule you for pulmonary function test and 6-minute walk test in 1 month Follow-up in clinic after that

## 2020-06-21 ENCOUNTER — Other Ambulatory Visit: Payer: Self-pay | Admitting: Nurse Practitioner

## 2020-06-21 DIAGNOSIS — Z13820 Encounter for screening for osteoporosis: Secondary | ICD-10-CM

## 2020-07-13 ENCOUNTER — Encounter: Payer: Self-pay | Admitting: Adult Health

## 2020-07-13 ENCOUNTER — Ambulatory Visit (INDEPENDENT_AMBULATORY_CARE_PROVIDER_SITE_OTHER): Payer: Medicare Other

## 2020-07-13 ENCOUNTER — Ambulatory Visit (INDEPENDENT_AMBULATORY_CARE_PROVIDER_SITE_OTHER): Payer: Medicare Other | Admitting: Adult Health

## 2020-07-13 ENCOUNTER — Other Ambulatory Visit: Payer: Self-pay

## 2020-07-13 VITALS — BP 128/70 | HR 89 | Ht 63.5 in | Wt 225.4 lb

## 2020-07-13 DIAGNOSIS — R0602 Shortness of breath: Secondary | ICD-10-CM

## 2020-07-13 DIAGNOSIS — R609 Edema, unspecified: Secondary | ICD-10-CM

## 2020-07-13 DIAGNOSIS — J189 Pneumonia, unspecified organism: Secondary | ICD-10-CM

## 2020-07-13 DIAGNOSIS — J9611 Chronic respiratory failure with hypoxia: Secondary | ICD-10-CM | POA: Insufficient documentation

## 2020-07-13 NOTE — Assessment & Plan Note (Signed)
Patient has had a prolonged recovery from COVID-19 infection, pneumonia, post Covid pneumonitis.  She is on a slow prednisone taper.  Currently at 20 mg of prednisone.  PFTs and 6-minute walk test are pending.  We will check chest x-ray today as she has an increase in symptom burden.  O2 saturations are normal with no increased oxygen demands. We will continue at current dose of steroids.  Check labs with BMP and bmet  Mild diuresis for component of volume overload  Plan  Patient Instructions  Increase Lasix 44m 2 tabs for next 3  days then back to 243mdaily as needed for ankle edema.  Weight daily, keep log .  Chest xray and labs today .  Continue on Prednisone 201maily.  Continue on Oxygen 4l/m- keep O2 sats >90%.  Return in 3 days for PFT and 6 min walk test as planned and As needed   Please contact office for sooner follow up if symptoms do not improve or worsen or seek emergency care

## 2020-07-13 NOTE — Assessment & Plan Note (Signed)
Continue on oxygen.  O2 saturation girls are greater than 90%.  Her O2 saturations 100% on 4 L.  We will continue on 4 L.  On return evaluate and see if we can start to slowly titrate oxygen levels down.

## 2020-07-13 NOTE — Progress Notes (Signed)
_0  ID: Tiffany Coleman, female    DOB: 1956-04-21, 64 y.o.   MRN: [id number]  Chief Complaint  Patient presents with  . 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 No mold, hot tub, Jacuzzi Smoking history: Never smoker Travel history: Originally from [place]. No significant recent travel Relevant family history: No significant family issue of lung disease.   CTA 05/26/2020-no PE, diffuse bilateral interstitial infiltrates and groundglass opacities. Overall improved compared to prior CT.  07/13/2020 Follow up : Post Covid pneumonitis patient returns for a 1 month follow-up.  Patient was admitted in August with IRWER-15 infection complicated by Covid pneumonia, acute respiratory failure ,and post Covid pneumonitis.  She was treated with steroids, remdesivir,baricitinib.  She did require high flow oxygen.  She was discharged in September with oxygen at 4 to 5 L.  And a slow prednisone taper.  She was readmitted in October with worsening acute on chronic respiratory failure and post Covid pneumonitis.  CT chest was negative for PE but showed bilateral infiltrates.  She required IV diuresis for fluid overload.  She was treated with IV steroids and discharged on prednisone 40 mg.  She has been weaned down to 20 mg.  At last visit she was placed on P JP prophylaxis with Bactrim 3 days weekly. She was set up for a 6-minute walk test and pulmonary function testing unfortunately these were missed and rescheduled for later this week. Patient says since last visit she is feeling  better until 3 days ago.  She complains that she has started to retain more fluid ankles have been more swollen and her breathing has not been as good.  She denies any increased cough congestion.  Oxygen demands have not increased.  O2 saturations on 4 L today was 100%. She remains on prednisone 20 mg daily. She denies any hemoptysis chest pain orthopnea. Weight is up 10 pounds over the last 6 weeks. Patient did restart her Lasix at 20 mg over the last 2 days.  Feels that it has helped some.  Allergies  Allergen Reactions  . Latex Hives and Itching  . Penicillins Hives, Itching and Other (See Comments)    Has patient had a PCN reaction causing immediate rash, facial/tongue/throat swelling, SOB or lightheadedness with hypotension: Yes Has patient had a PCN reaction causing severe rash involving mucus membranes or skin necrosis: Unk Has patient had a PCN reaction that required hospitalization: Unk Has patient had a PCN reaction occurring within the last 10 years: No If all of the above answers are "NO", then may proceed with Cephalosporin use.   . Tape Hives, Itching and Other (See Comments)    Coban wrap, in particular  . Codeine Itching     There is no immunization history on file for this patient.  Past Medical History:  Diagnosis Date  . Anemia yrs ago  . Arthritis    knees  . Bronchitis    hx  . Complication of anesthesia    trouble breathing when wake up needs breathing tx when wakes up  . Hx of colonic polyps 2015  .  Obstructive sleep apnea 06/02/2016   No CPAP used  . Recurrent ventral hernia 09/08/2013  . Umbilical hernia     Tobacco History: Social History   Tobacco Use  Smoking Status Former Smoker  . Years: 20.00  . Types: Cigarettes  . Quit date: 05/26/2020  . Years since quitting: 0.1  Smokeless Tobacco Never Used  Tobacco Comment   smokes 2-3 cigs per day    Counseling given: Not Answered Comment: smokes 2-3 cigs per day    Outpatient Medications  Prior to Visit  Medication Sig Dispense Refill  . acetaminophen (TYLENOL) 325 MG tablet Take 2 tablets (650 mg total) by mouth every 6 (six) hours as needed for mild pain or headache (fever >/= 101).    Marland Kitchen albuterol (PROVENTIL HFA;VENTOLIN HFA) 108 (90 Base) MCG/ACT inhaler Inhale 2 puffs into the lungs every 6 (six) hours as needed for wheezing or shortness of breath. 1 Inhaler 2  . budesonide-formoterol (SYMBICORT) 160-4.5 MCG/ACT inhaler Inhale 2 puffs into the lungs 2 (two) times daily. 1 each 12  . fluticasone (FLONASE) 50 MCG/ACT nasal spray Place 2 sprays into both nostrils daily.  2  . furosemide (LASIX) 20 MG tablet Take 20 mg by mouth daily.    Marland Kitchen gabapentin (NEURONTIN) 300 MG capsule Take 300 mg by mouth 3 (three) times daily as needed (nerve pain).     Marland Kitchen guaiFENesin-dextromethorphan (ROBITUSSIN DM) 100-10 MG/5ML syrup Take 5 mLs by mouth every 6 (six) hours as needed for cough. 118 mL 0  . HYDROcodone-homatropine (HYCODAN) 5-1.5 MG/5ML syrup Take 5 mLs by mouth every 6 (six) hours as needed for cough. 120 mL 0  . ipratropium-albuterol (DUONEB) 0.5-2.5 (3) MG/3ML SOLN Take 3 mLs by nebulization every 2 (two) hours as needed. 360 mL   . loratadine (CLARITIN) 10 MG tablet Take 1 tablet (10 mg total) by mouth daily.    . pantoprazole (PROTONIX) 40 MG tablet Take 1 tablet (40 mg total) by mouth daily at 6 (six) AM. 30 tablet 2  . predniSONE (DELTASONE) 10 MG tablet Take 1-4 tablets (10-40 mg total) by mouth daily before breakfast. Take 40 mg daily x14 days, then 30 mg daily x14 days, then 20 mg daily x14 days, then 10 mg daily. 120 tablet 1  . sulfamethoxazole-trimethoprim (BACTRIM DS) 800-160 MG tablet Take 1 tablet by mouth 3 (three) times a week. 15 tablet 2  . zolpidem (AMBIEN) 10 MG tablet Take 10 mg by mouth at bedtime as needed for sleep.     . polyethylene glycol (MIRALAX / GLYCOLAX) 17 g packet Take 17 g by mouth daily. 14 each 0  . senna-docusate (SENOKOT-S) 8.6-50 MG tablet Take 1  tablet by mouth 2 (two) times daily.     No facility-administered medications prior to visit.     Review of Systems:   Constitutional:   No  weight loss, night sweats,  Fevers, chills,  +fatigue, or  lassitude.  HEENT:   No headaches,  Difficulty swallowing,  Tooth/dental problems, or  Sore throat,                No sneezing, itching, ear ache, nasal congestion, post nasal drip,   CV:  No chest pain,  Orthopnea, PND, cough swelling in lower extremities, anasarca, dizziness, palpitations, syncope.   GI  No heartburn, indigestion, abdominal pain, nausea, vomiting, diarrhea, change in bowel habits, loss of appetite, bloody stools.   Resp:    No chest wall deformity  Skin: no rash or lesions.  GU: no dysuria, change in color of urine, no urgency or frequency.  No flank pain, no hematuria   MS:  No joint pain or swelling.  No decreased range of motion.  No back pain.    Physical Exam  BP 128/70 (BP Location: Left Arm, Cuff Size: Large)   Pulse 89   Ht 5' 3.5" (1.613 m)   Wt 225 lb 6.4 oz (102.2 kg)   SpO2 100%   BMI 39.30 kg/m   GEN: A/Ox3; pleasant , NAD, on O2    HEENT:  [place]/AT,    NOSE-clear, THROAT-clear, no lesions, no postnasal drip or exudate noted.   NECK:  Supple w/ fair ROM; no JVD; normal carotid impulses w/o bruits; no thyromegaly or nodules palpated; no lymphadenopathy.    RESP  Clear  P & A; w/o, wheezes/ rales/ or rhonchi. no accessory muscle use, no dullness to percussion  CARD:  RRR, no m/r/g, tr -1 +  peripheral edema, pulses intact, no cyanosis or clubbing.  GI:   Soft & nt; nml bowel sounds;   Musco: Warm bil, no deformities or joint swelling noted.   Neuro: alert, no focal deficits noted.    Skin: Warm, no lesions or rashes    Lab Results:   Imaging: No results found.    No flowsheet data found.  No results found for: NITRICOXIDE      Assessment & Plan:   Pneumonitis: Post Covid pneumonitis Patient has had a prolonged  recovery from COVID-19 infection, pneumonia, post Covid pneumonitis.  She is on a slow prednisone taper.  Currently at 20 mg of prednisone.  PFTs and 6-minute walk test are pending.  We will check chest x-ray today as she has an increase in symptom burden.  O2 saturations are normal with no increased oxygen demands. We will continue at current dose of steroids.  Check labs with BMP and bmet  Mild diuresis for component of volume overload  Plan  Patient Instructions  Increase Lasix 32m 2 tabs for next 3  days then back to 232mdaily as needed for ankle edema.  Weight daily, keep log .  Chest xray and labs today .  Continue on Prednisone 2059maily.  Continue on Oxygen 4l/m- keep O2 sats >90%.  Return in 3 days for PFT and 6 min walk test as planned and As needed   Please contact office for sooner follow up if symptoms do not improve or worsen or seek emergency care        Chronic respiratory failure with hypoxia ([place]CC[place]inue on oxygen.  O2 saturation girls are greater than 90%.  Her O2 saturations 100% on 4 L.  We will continue on 4 L.  On return evaluate and see if we can start to slowly titrate oxygen levels down.  Edema Lower extremity edema possible component of diastolic dysfunction.  Patient says she did eat quite a bit of salt over the holidays.  Along with steroids may have a component of fluid retention. We will begin gentle diuresis.  Check labs including BMP and BNP.  Plan  Patient Instructions  Increase Lasix 31m32mtabs for next 3  days then back to 31mg61mly as needed for ankle edema.  Weight daily, keep log .  Chest xray and labs today .  Continue on Prednisone 31mg 67my.  Continue on Oxygen 4l/m- keep O2 sats >90%.  Return in 3 days for PFT and 6 min walk test as planned and As needed   Please  contact office for sooner follow up if symptoms do not improve or worsen or seek emergency care           Rexene Edison, NP 07/13/2020

## 2020-07-13 NOTE — Addendum Note (Signed)
Addended by: Suzzanne Cloud E on: 07/13/2020 04:03 PM   Modules accepted: Orders

## 2020-07-13 NOTE — Patient Instructions (Addendum)
Increase Lasix 73m 2 tabs for next 3  days then back to 241mdaily as needed for ankle edema.  Weight daily, keep log .  Chest xray and labs today .  Continue on Prednisone 2032maily.  Continue on Oxygen 4l/m- keep O2 sats >90%.  Return in 3 days for PFT and 6 min walk test as planned and As needed   Please contact office for sooner follow up if symptoms do not improve or worsen or seek emergency care

## 2020-07-13 NOTE — Assessment & Plan Note (Signed)
Lower extremity edema possible component of diastolic dysfunction.  Patient says she did eat quite a bit of salt over the holidays.  Along with steroids may have a component of fluid retention. We will begin gentle diuresis.  Check labs including BMP and BNP.  Plan  Patient Instructions  Increase Lasix 71m 2 tabs for next 3  days then back to 265mdaily as needed for ankle edema.  Weight daily, keep log .  Chest xray and labs today .  Continue on Prednisone 2022maily.  Continue on Oxygen 4l/m- keep O2 sats >90%.  Return in 3 days for PFT and 6 min walk test as planned and As needed   Please contact office for sooner follow up if symptoms do not improve or worsen or seek emergency care

## 2020-07-14 LAB — CBC WITH DIFFERENTIAL/PLATELET
Basophils Absolute: 0 10*3/uL (ref 0.0–0.1)
Basophils Relative: 0.4 % (ref 0.0–3.0)
Eosinophils Absolute: 0 10*3/uL (ref 0.0–0.7)
Eosinophils Relative: 0.1 % (ref 0.0–5.0)
HCT: 37.5 % (ref 36.0–46.0)
Hemoglobin: 12 g/dL (ref 12.0–15.0)
Lymphocytes Relative: 6.1 % — ABNORMAL LOW (ref 12.0–46.0)
Lymphs Abs: 0.7 10*3/uL (ref 0.7–4.0)
MCHC: 31.9 g/dL (ref 30.0–36.0)
MCV: 87.4 fl (ref 78.0–100.0)
Monocytes Absolute: 0.3 10*3/uL (ref 0.1–1.0)
Monocytes Relative: 3 % (ref 3.0–12.0)
Neutro Abs: 9.8 10*3/uL — ABNORMAL HIGH (ref 1.4–7.7)
Neutrophils Relative %: 90.4 % — ABNORMAL HIGH (ref 43.0–77.0)
Platelets: 245 10*3/uL (ref 150.0–400.0)
RBC: 4.29 Mil/uL (ref 3.87–5.11)
RDW: 15.8 % — ABNORMAL HIGH (ref 11.5–15.5)
WBC: 10.8 10*3/uL — ABNORMAL HIGH (ref 4.0–10.5)

## 2020-07-14 LAB — BASIC METABOLIC PANEL
BUN: 17 mg/dL (ref 6–23)
CO2: 34 mEq/L — ABNORMAL HIGH (ref 19–32)
Calcium: 10 mg/dL (ref 8.4–10.5)
Chloride: 97 mEq/L (ref 96–112)
Creatinine, Ser: 1.02 mg/dL (ref 0.40–1.20)
GFR: 57.29 mL/min — ABNORMAL LOW (ref >60.00)
Glucose, Bld: 129 mg/dL — ABNORMAL HIGH (ref 70–99)
Potassium: 4.1 mEq/L (ref 3.5–5.1)
Sodium: 140 mEq/L (ref 135–145)

## 2020-07-14 LAB — BRAIN NATRIURETIC PEPTIDE: Pro B Natriuretic peptide (BNP): 66 pg/mL (ref 0.0–100.0)

## 2020-07-16 ENCOUNTER — Ambulatory Visit: Payer: Medicare Other

## 2020-07-16 ENCOUNTER — Ambulatory Visit (INDEPENDENT_AMBULATORY_CARE_PROVIDER_SITE_OTHER): Payer: Medicare Other | Admitting: Pulmonary Disease

## 2020-07-16 ENCOUNTER — Encounter: Payer: Self-pay | Admitting: Pulmonary Disease

## 2020-07-16 ENCOUNTER — Other Ambulatory Visit: Payer: Self-pay

## 2020-07-16 VITALS — BP 130/80 | HR 91 | Temp 98.3°F | Ht 63.0 in | Wt 227.0 lb

## 2020-07-16 DIAGNOSIS — R0602 Shortness of breath: Secondary | ICD-10-CM | POA: Diagnosis not present

## 2020-07-16 DIAGNOSIS — J189 Pneumonia, unspecified organism: Secondary | ICD-10-CM

## 2020-07-16 NOTE — Patient Instructions (Addendum)
Reduce the prednisone to 10 mg a day for 2 weeks and then 5 mg a day for 2 weeks and then stop We will reschedule the PFTs and 6-minute walk test  We will also make sure that you have a high-resolution CT follow-up in 3 months and follow-up in clinic We will make referral to pulmonary rehab -Follow-up in clinic in 3 months.

## 2020-07-16 NOTE — Progress Notes (Signed)
Tiffany Coleman    [id number]    Jun 07, 1956  Primary Care Physician:Avbuere, Christean Grief, MD  Referring Physician: Nolene Ebbs, [address] [place],  [place] [address]  Chief complaint: Follow-up for asthma mild, post COVID-19  HPI: 64 year old with history of asthma, morbid obesity, OSA  Recent hospitalization for COVID-19 infection with prolonged hospital stay and discharged on 05/11/20 She was readmitted back to [facility] long hospital on 10/13 with hypoxic respiratory failure.  No PE on CTA but imaging shows extensive bilateral infiltrates. 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 She has since resumed it after Education officer, museum noticed the discrepancy and she is now at 20 mg a day. 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 No mold, hot tub, Jacuzzi Smoking history: Never smoker Travel history: Originally from [place]. No significant recent travel Relevant family history: No significant family issue of lung disease.  Interim history: Seen by Rexene Edison nurse practitioner on 11/30 for lower extremity edema increased shortness of breath.  Lasix was increased for a couple of days with improvement in symptoms.  Overall she continues to have gradual improvement since her admission for COVID-19. She was supposed to get 6-minute walk test and PFTs but arrived late and could not do the test.  This will need to be rescheduled.  Outpatient Encounter Medications as of 07/16/2020  Medication Sig  . acetaminophen (TYLENOL) 325 MG tablet Take 2 tablets (650 mg total) by mouth every 6 (six) hours as needed for mild pain or headache (fever >/= 101).  Marland Kitchen albuterol (PROVENTIL HFA;VENTOLIN HFA) 108 (90 Base)  MCG/ACT inhaler Inhale 2 puffs into the lungs every 6 (six) hours as needed for wheezing or shortness of breath.  . budesonide-formoterol (SYMBICORT) 160-4.5 MCG/ACT inhaler Inhale 2 puffs into the lungs 2 (two) times daily.  . fluticasone (FLONASE) 50 MCG/ACT nasal spray Place 2 sprays into both nostrils daily.  . furosemide (LASIX) 20 MG tablet Take 20 mg by mouth daily.  Marland Kitchen gabapentin (NEURONTIN) 300 MG capsule Take 300 mg by mouth 3 (three) times daily as needed (nerve pain).   Marland Kitchen guaiFENesin-dextromethorphan (ROBITUSSIN DM) 100-10 MG/5ML syrup Take 5 mLs by mouth every 6 (six) hours as needed for cough.  Marland Kitchen HYDROcodone-homatropine (HYCODAN) 5-1.5 MG/5ML syrup Take 5 mLs by mouth every 6 (six) hours as needed for cough.  Marland Kitchen ipratropium-albuterol (DUONEB) 0.5-2.5 (3) MG/3ML SOLN Take 3 mLs by nebulization every 2 (two) hours as needed.  . loratadine (CLARITIN) 10 MG tablet Take 1 tablet (10 mg total) by mouth daily.  . pantoprazole (PROTONIX) 40 MG tablet Take 1 tablet (40 mg total) by mouth daily at 6 (six) AM.  . predniSONE (DELTASONE) 10 MG tablet Take 1-4 tablets (10-40 mg total) by mouth daily before breakfast. Take 40 mg daily x14 days, then 30 mg daily x14 days, then 20 mg daily x14 days, then 10 mg daily.  Marland Kitchen sulfamethoxazole-trimethoprim (BACTRIM DS) 800-160 MG tablet Take 1 tablet by mouth 3 (three) times a week.  . zolpidem (AMBIEN) 10 MG tablet Take 10 mg by mouth at bedtime as needed for sleep.    No facility-administered encounter medications on file as of 07/16/2020.    Physical Exam: Blood pressure  130/80, pulse 91, temperature 98.3 F (36.8 C), temperature source Skin, height _0  (1.6 m), weight 227 lb (103 kg), SpO2 100 %. Gen:      No acute distress HEENT:  EOMI, sclera anicteric Neck:     No masses; no thyromegaly Lungs:    Clear to auscultation bilaterally; normal respiratory effort CV:         Regular rate and rhythm; no murmurs Abd:      + bowel sounds; soft,  non-tender; no palpable masses, no distension Ext:    No edema; adequate peripheral perfusion Skin:      Warm and dry; no rash Neuro: alert and oriented x 3 Psych: normal mood and affect  Data Reviewed: Imaging: CTA 05/26/2020-no PE, diffuse bilateral interstitial infiltrates and groundglass opacities.  Overall improved compared to prior CT. Chest x-ray 07/13/2020 -persistent bilateral pulmonary infiltrates.  I have reviewed the images personally.  PFTs:  Labs: CBC 07/13/2020-WBC 10.8, eos 0.1%, absolute eosinophil count 11 CMP 07/13/2020-normal except for slight increase in bicarb and glucose  Cardiac: Echocardiogram 04/24/2020-LVEF 60 to 65%, moderate concentric LVH, RV systolic size and function are normal.  Assessment:  Post Covid pneumonitis Currently on prednisone 20 mg with gradual improvement in symptoms. We will start tapering steroids to prednisone 10 mg a day for 2 weeks and then 5 mg a day for 2 weeks and then stop Continue Bactrim for P JP prophylaxis until she is completely off prednisone Reschedule PFTs and 6-minute walk test testing could not be completed today due to late arrival  Follow-up high-res CT in 3 months.  HFpEF Echo reviewed with EF 60-65%, LVH Continue Lasix 20 mg a day  OSA Intolerant of CPAP and is not interested in retrying  Plan/Recommendations: Start prednisone taper, continue Bactrim Re schedule PFTs, 6-minute walk test High-res CT in 3 months Continue Lasix  Marshell Garfinkel MD  Pulmonary and Critical Care 07/16/2020, 10:00 AM  CC: Nolene Ebbs, MD

## 2020-08-03 ENCOUNTER — Encounter (HOSPITAL_COMMUNITY): Payer: Self-pay | Admitting: *Deleted

## 2020-08-03 NOTE — Progress Notes (Signed)
Received referral from Dr. Vaughan Browner  for this pt to participate in pulmonary rehab with the the diagnosis of Shortness of breath - Acute Hypoxemic Failure due to Covid. Clinical review of pt follow up appt on 12/3 Pulmonary office note. Pt has upcoming procedures walk test 12/27 and PFT 12/29  Pt with Covid Risk Score 4. Pt appropriate for scheduling for Pulmonary rehab.  Will forward to support staff for scheduling and verification of insurance eligibility/benefits with pt consent. Cherre Huger, BSN Cardiac and Training and development officer

## 2020-08-04 ENCOUNTER — Encounter (HOSPITAL_COMMUNITY): Payer: Self-pay

## 2020-08-04 ENCOUNTER — Other Ambulatory Visit: Payer: Self-pay

## 2020-08-04 ENCOUNTER — Emergency Department (HOSPITAL_COMMUNITY)
Admission: EM | Admit: 2020-08-04 | Discharge: 2020-08-04 | Disposition: A | Discharge status: Home / Self Care | Payer: Medicare Other | Attending: Emergency Medicine | Admitting: Emergency Medicine

## 2020-08-04 ENCOUNTER — Emergency Department (HOSPITAL_COMMUNITY): Payer: Medicare Other

## 2020-08-04 DIAGNOSIS — R072 Precordial pain: Secondary | ICD-10-CM | POA: Insufficient documentation

## 2020-08-04 DIAGNOSIS — Z5321 Procedure and treatment not carried out due to patient leaving prior to being seen by health care provider: Secondary | ICD-10-CM | POA: Diagnosis not present

## 2020-08-04 LAB — CBC
HCT: 40 % (ref 36.0–46.0)
Hemoglobin: 12.5 g/dL (ref 12.0–15.0)
MCH: 27.8 pg (ref 26.0–34.0)
MCHC: 31.3 g/dL (ref 30.0–36.0)
MCV: 88.9 fL (ref 80.0–100.0)
Platelets: 234 10*3/uL (ref 150–400)
RBC: 4.5 MIL/uL (ref 3.87–5.11)
RDW: 13.1 % (ref 11.5–15.5)
WBC: 11.6 10*3/uL — ABNORMAL HIGH (ref 4.0–10.5)
nRBC: 0 % (ref 0.0–0.2)

## 2020-08-04 LAB — BASIC METABOLIC PANEL
Anion gap: 12 (ref 5–15)
BUN: 13 mg/dL (ref 8–23)
CO2: 30 mmol/L (ref 22–32)
Calcium: 9.9 mg/dL (ref 8.9–10.3)
Chloride: 98 mmol/L (ref 98–111)
Creatinine, Ser: 1.07 mg/dL — ABNORMAL HIGH (ref 0.44–1.00)
GFR, Estimated: 57 mL/min — ABNORMAL LOW (ref >60)
Glucose, Bld: 104 mg/dL — ABNORMAL HIGH (ref 70–99)
Potassium: 3.6 mmol/L (ref 3.5–5.1)
Sodium: 140 mmol/L (ref 135–145)

## 2020-08-04 LAB — TROPONIN I (HIGH SENSITIVITY): Troponin I (High Sensitivity): 10 ng/L (ref <18)

## 2020-08-04 NOTE — ED Notes (Signed)
Pt eloped from waiting area. Called 3X.

## 2020-08-04 NOTE — ED Triage Notes (Signed)
Pt reports sternal chest pressure on going for 2 days. Pt sts covid in august and is on 4L [place] baseline. They are working to titrate her off of it. Pt concerned for fluid around the heart.

## 2020-08-04 NOTE — ED Notes (Signed)
Attempted to replace oxygen tank and evaluate levels. Pt attaching home tank and leaving.

## 2020-08-09 ENCOUNTER — Ambulatory Visit: Payer: Medicare Other

## 2020-08-18 ENCOUNTER — Ambulatory Visit: Payer: Medicare Other

## 2020-08-18 ENCOUNTER — Other Ambulatory Visit: Payer: Self-pay

## 2020-08-18 DIAGNOSIS — Z8616 Personal history of COVID-19: Secondary | ICD-10-CM

## 2020-08-18 DIAGNOSIS — R0602 Shortness of breath: Secondary | ICD-10-CM

## 2020-08-18 NOTE — Progress Notes (Signed)
Six Minute Walk - 08/18/20 1437      Six Minute Walk   Medications taken before test (dose and time) NONE    Supplemental oxygen during test? Yes    O2 Flow Rate (L/min) 4 L/min    Type Continuous    Lap distance in meters  34 meters    Laps Completed 8    Partial lap (in meters) 0 meters    Baseline BP (sitting) 130/80    Baseline Heartrate 95    Baseline Dyspnea (Borg Scale) 0    Baseline Fatigue (Borg Scale) 2    Baseline SPO2 100 %      End of Test Values    BP (sitting) 140/80    Heartrate 126    Dyspnea (Borg Scale) 1    Fatigue (Borg Scale) 4    SPO2 95 %      2 Minutes Post Walk Values   BP (sitting) 130/80    Heartrate 107    SPO2 100 %    Stopped or paused before six minutes? No    Other Symptoms at end of exercise: --   No concerns/complaints     Interpretation   Distance completed 272 meters    Tech Comments: Patient walked slow pace with walker on 4 liters oxygen, Patient completed entire 6 minute walk without stopping. Expressed some shortness of breath but maintained good sats.

## 2020-08-23 ENCOUNTER — Telehealth (HOSPITAL_COMMUNITY): Payer: Self-pay

## 2020-08-23 NOTE — Telephone Encounter (Signed)
Pt insurance is active and benefits verified through [facility] Medicare Co-pay 0, DED 0/0 met, out of pocket $7,550/0 met, co-insurance 20%. no pre-authorization required. Passport, [place]/[employer] 08/23/2020_0 :24am, REF# [id number]

## 2020-08-28 ENCOUNTER — Other Ambulatory Visit (HOSPITAL_COMMUNITY)
Admission: RE | Admit: 2020-08-28 | Discharge: 2020-08-28 | Disposition: A | Discharge status: Home / Self Care | Payer: Medicare Other | Source: Ambulatory Visit | Attending: Pulmonary Disease | Admitting: Pulmonary Disease

## 2020-08-28 DIAGNOSIS — Z01812 Encounter for preprocedural laboratory examination: Secondary | ICD-10-CM | POA: Diagnosis present

## 2020-08-28 DIAGNOSIS — Z20822 Contact with and (suspected) exposure to covid-19: Secondary | ICD-10-CM | POA: Insufficient documentation

## 2020-08-28 LAB — SARS CORONAVIRUS 2 (TAT 6-24 HRS): SARS Coronavirus 2: NEGATIVE

## 2020-08-31 ENCOUNTER — Ambulatory Visit (INDEPENDENT_AMBULATORY_CARE_PROVIDER_SITE_OTHER): Payer: Medicare Other | Admitting: Pulmonary Disease

## 2020-08-31 ENCOUNTER — Other Ambulatory Visit: Payer: Self-pay

## 2020-08-31 DIAGNOSIS — Z8616 Personal history of COVID-19: Secondary | ICD-10-CM | POA: Diagnosis not present

## 2020-08-31 DIAGNOSIS — R0602 Shortness of breath: Secondary | ICD-10-CM

## 2020-08-31 LAB — PULMONARY FUNCTION TEST
DL/VA % pred: 90 %
DL/VA: 3.78 ml/min/mmHg/L
DLCO cor % pred: 45 %
DLCO cor: 8.67 ml/min/mmHg
DLCO unc % pred: 44 %
DLCO unc: 8.42 ml/min/mmHg
FEF 25-75 Post: 3.34 L/sec
FEF 25-75 Pre: 2.98 L/sec
FEF2575-%Change-Post: 12 %
FEF2575-%Pred-Post: 188 %
FEF2575-%Pred-Pre: 167 %
FEV1-%Change-Post: 0 %
FEV1-%Pred-Post: 69 %
FEV1-%Pred-Pre: 68 %
FEV1-Post: 1.28 L
FEV1-Pre: 1.28 L
FEV1FVC-%Change-Post: 0 %
FEV1FVC-%Pred-Pre: 122 %
FEV6-%Change-Post: 1 %
FEV6-%Pred-Post: 58 %
FEV6-%Pred-Pre: 58 %
FEV6-Post: 1.34 L
FEV6-Pre: 1.33 L
FEV6FVC-%Change-Post: 0 %
FEV6FVC-%Pred-Post: 103 %
FEV6FVC-%Pred-Pre: 103 %
FVC-%Change-Post: 1 %
FVC-%Pred-Post: 56 %
FVC-%Pred-Pre: 55 %
FVC-Post: 1.35 L
FVC-Pre: 1.33 L
Post FEV1/FVC ratio: 95 %
Post FEV6/FVC ratio: 100 %
Pre FEV1/FVC ratio: 96 %
Pre FEV6/FVC Ratio: 100 %

## 2020-08-31 NOTE — Progress Notes (Signed)
PFT done today. 

## 2020-09-01 ENCOUNTER — Telehealth (HOSPITAL_COMMUNITY): Payer: Self-pay | Admitting: *Deleted

## 2020-09-06 ENCOUNTER — Other Ambulatory Visit: Payer: Self-pay

## 2020-09-06 ENCOUNTER — Encounter: Payer: Self-pay | Admitting: Adult Health

## 2020-09-06 ENCOUNTER — Ambulatory Visit (INDEPENDENT_AMBULATORY_CARE_PROVIDER_SITE_OTHER): Payer: Medicare Other | Admitting: Adult Health

## 2020-09-06 VITALS — BP 122/84 | HR 76 | Temp 98.8°F | Ht 63.0 in | Wt 222.4 lb

## 2020-09-06 DIAGNOSIS — J9611 Chronic respiratory failure with hypoxia: Secondary | ICD-10-CM

## 2020-09-06 DIAGNOSIS — J302 Other seasonal allergic rhinitis: Secondary | ICD-10-CM

## 2020-09-06 DIAGNOSIS — B370 Candidal stomatitis: Secondary | ICD-10-CM | POA: Insufficient documentation

## 2020-09-06 DIAGNOSIS — J189 Pneumonia, unspecified organism: Secondary | ICD-10-CM

## 2020-09-06 DIAGNOSIS — J9601 Acute respiratory failure with hypoxia: Secondary | ICD-10-CM | POA: Diagnosis not present

## 2020-09-06 DIAGNOSIS — J45909 Unspecified asthma, uncomplicated: Secondary | ICD-10-CM

## 2020-09-06 DIAGNOSIS — G4733 Obstructive sleep apnea (adult) (pediatric): Secondary | ICD-10-CM

## 2020-09-06 MED ORDER — IPRATROPIUM-ALBUTEROL 0.5-2.5 (3) MG/3ML IN SOLN: 3.0000 mL | RESPIRATORY_TRACT | Status: DC | PRN

## 2020-09-06 MED ORDER — PREDNISONE 10 MG PO TABS: 10.0000 mg | ORAL_TABLET | Freq: Every day | ORAL | 0 refills | Status: DC

## 2020-09-06 MED ORDER — CLOTRIMAZOLE 10 MG MT TROC: 10.0000 mg | Freq: Every day | OROMUCOSAL | 0 refills | Status: DC

## 2020-09-06 MED ORDER — ALBUTEROL SULFATE HFA 108 (90 BASE) MCG/ACT IN AERS: 2.0000 | INHALATION_SPRAY | Freq: Four times a day (QID) | RESPIRATORY_TRACT | 2 refills | Status: AC | PRN

## 2020-09-06 NOTE — Assessment & Plan Note (Signed)
CPAP intolerant.

## 2020-09-06 NOTE — Patient Instructions (Addendum)
Stop Symbicort.  Begin Duoneb Twice daily.  Albuterol inhaler As needed   Activity as tolerated.  Mycelex troche five times daily for 1 week  Prednisone 34m daily until seen back in office  CT chest next week as planned  Pulmonary rehab as planned  Decrease Oxygen to 2l/m rest and 3l/m with activity .  Order for small O2 tanks.  Follow up with Dr. MVaughan Browneror Rhina Kramme NP in 2 weeks and As needed   Please contact office for sooner follow up if symptoms do not improve or worsen or seek emergency care

## 2020-09-06 NOTE — Assessment & Plan Note (Signed)
Post Covid pneumonitis/postinflammatory fibrosis.  High-resolution CT chest is pending for next week.  Patient's pulmonary function testing today shows moderate restriction and a decreased diffusing capacity. No perceived benefit with Symbicort.  She does have a smoking history   For now can use DuoNeb twice daily.  Stop Symbicort. We will continue on steroids for now.  On return visit after high-resolution CT chest can decide about a slow taper to off.  Suspect a lot of her ongoing symptoms are component of deconditioning as well.  Pulmonary rehab is a great idea for her. She has no increased oxygen mans in fact today oxygen mans are decreased.  Plan  Patient Instructions  Stop Symbicort.  Begin Duoneb Twice daily.  Albuterol inhaler As needed   Activity as tolerated.  Mycelex troche five times daily for 1 week  Prednisone 17m daily until seen back in office  CT chest next week as planned  Pulmonary rehab as planned  Decrease Oxygen to 2l/m rest and 3l/m with activity .  Order for small O2 tanks.  Follow up with Dr. MVaughan Browneror Liadan Guizar NP in 2 weeks and As needed   Please contact office for sooner follow up if symptoms do not improve or worsen or seek emergency care

## 2020-09-06 NOTE — Assessment & Plan Note (Signed)
Oral care discussed. Mycelex troche x1 week. Discontinue Symbicort for now  Plan  Patient Instructions  Stop Symbicort.  Begin Duoneb Twice daily.  Albuterol inhaler As needed   Activity as tolerated.  Mycelex troche five times daily for 1 week  Prednisone 34m daily until seen back in office  CT chest next week as planned  Pulmonary rehab as planned  Decrease Oxygen to 2l/m rest and 3l/m with activity .  Order for small O2 tanks.  Follow up with Dr. MVaughan Browneror Jackye Dever NP in 2 weeks and As needed   Please contact office for sooner follow up if symptoms do not improve or worsen or seek emergency care

## 2020-09-06 NOTE — Assessment & Plan Note (Signed)
Decreased O2 demands.  Will decrease oxygen to 2 L at rest and 3 L with activity to maintain O2 saturations greater than 88 to 90%

## 2020-09-06 NOTE — Progress Notes (Signed)
_0  ID: Tiffany Coleman, female    DOB: 21-Nov-1955, 65 y.o.   MRN: [id number]    Referring provider: Nolene Ebbs, MD  HPI: 65 yo female former smoker 05/2020 with a known history of asthma and OSA (CPAP intolerant).  03/2020 Covid 19 infection complicated by Covid Pneumonia , acute respiratory failure and post Covid pneumonitis (tx w/ steroids, Remdesivir, Baricitinib, high flow O2) . Required Oxygen at discharge. Readmitted in October with acute on chronic resp failure w/ post Covid Pnuemonitis and fluid overload. Treated with IV steroids and diuresis. Treated with slow steroid taper.   TEST/EVENTS :   09/06/2020 Acute OV : Cough  Patient presents for an acute office visit. Complains of of ongoing symptoms of shortness of breath decreased activity tolerance intermittent cough and sore throat and irritation and mouth.  Patient complains that she feels like that she has a lump in her throat and soreness especially all around her tongue.  She also feels that she has thrush.  As above patient had severe COVID-19 infection in August 2021.  This was complicated by Covid pneumonia, acute respiratory failure, post Covid pneumonitis she was admitted twice in the hospital in August and then readmitted in October.  She was felt to have a post Covid pneumonitis complicated by fluid overload.  During initial admission she did require treatment with remdesivir steroids high flow oxygen and Baricitinib.  She was continued on prolonged steroids for suspected post Covid pneumonitis as CT chest showed bilateral groundglass opacities. Patient denies any hemoptysis, chest pain, orthopnea. She was discharged on 4 L of oxygen.  O2 saturations have been good at home 95% or better.  Today in the office O2 saturation 100% on 4 L.  Walk test in the office shows O2 saturations maintained at 3 L with O2 saturations greater than 90%.  She was able to maintain O2 saturations greater than 90% at 2 L at  rest. Patient was seen last month in the office at that time was having worsening symptoms.  She was restarted on prednisone.  She is currently on 10 mg of prednisone.  Even though instructions were to slowly taper off.  Patient says she has been unable to taper off her prednisone as her symptoms have persisted.  She has been referred to pulmonary rehab.  She has a high-resolution CT chest pending for next week. PFTs were completed August 31, 2020 that showed moderate restriction with an FEV1 at 69%, ratio 95, FVC 56%, no significant bronchodilator response, TLC 40%, DLCO 44%. She does request a handicap sticker today.  Also a letter to her post office to help with a mailbox at her home as it is very difficult for her to get around with her oxygen tanks.  She also would like smaller portable tanks that are easier to transport and be more independent.  Patient does live alone.  Her daughter and grandson help her.  She was started on Symbicort in October but feels that it has not helped any and may be making her mouth and throat sore.   Allergies  Allergen Reactions  . Latex Hives and Itching  . Penicillins Hives, Itching and Other (See Comments)    Has patient had a PCN reaction causing immediate rash, facial/tongue/throat swelling, SOB or lightheadedness with hypotension: Yes Has patient had a PCN reaction causing severe rash involving mucus membranes or skin necrosis: Unk Has patient had a PCN reaction that required hospitalization: Unk Has patient had a PCN reaction occurring within the  last 10 years: No If all of the above answers are "NO", then may proceed with Cephalosporin use.   . Tape Hives, Itching and Other (See Comments)    Coban wrap, in particular  . Codeine Itching     There is no immunization history on file for this patient.  Past Medical History:  Diagnosis Date  . Anemia yrs ago  . Arthritis    knees  . Bronchitis    hx  . Complication of anesthesia    trouble  breathing when wake up needs breathing tx when wakes up  . Hx of colonic polyps 2015  . Obstructive sleep apnea 06/02/2016   No CPAP used  . Recurrent ventral hernia 09/08/2013  . Umbilical hernia     Tobacco History: Social History   Tobacco Use  Smoking Status Former Smoker  . Years: 20.00  . Types: Cigarettes  . Quit date: 05/26/2020  . Years since quitting: 0.2  Smokeless Tobacco Never Used  Tobacco Comment   smokes 2-3 cigs per day    Counseling given: Not Answered Comment: smokes 2-3 cigs per day    Outpatient Medications Prior to Visit  Medication Sig Dispense Refill  . acetaminophen (TYLENOL) 325 MG tablet Take 2 tablets (650 mg total) by mouth every 6 (six) hours as needed for mild pain or headache (fever >/= 101).    . budesonide-formoterol (SYMBICORT) 160-4.5 MCG/ACT inhaler Inhale 2 puffs into the lungs 2 (two) times daily. 1 each 12  . fluticasone (FLONASE) 50 MCG/ACT nasal spray Place 2 sprays into both nostrils daily.  2  . furosemide (LASIX) 20 MG tablet Take 20 mg by mouth daily.    Marland Kitchen gabapentin (NEURONTIN) 300 MG capsule Take 300 mg by mouth 3 (three) times daily as needed (nerve pain).     Marland Kitchen guaiFENesin-dextromethorphan (ROBITUSSIN DM) 100-10 MG/5ML syrup Take 5 mLs by mouth every 6 (six) hours as needed for cough. 118 mL 0  . HYDROcodone-homatropine (HYCODAN) 5-1.5 MG/5ML syrup Take 5 mLs by mouth every 6 (six) hours as needed for cough. 120 mL 0  . loratadine (CLARITIN) 10 MG tablet Take 1 tablet (10 mg total) by mouth daily.    . pantoprazole (PROTONIX) 40 MG tablet Take 1 tablet (40 mg total) by mouth daily at 6 (six) AM. 30 tablet 2  . predniSONE (DELTASONE) 10 MG tablet Take 1-4 tablets (10-40 mg total) by mouth daily before breakfast. Take 40 mg daily x14 days, then 30 mg daily x14 days, then 20 mg daily x14 days, then 10 mg daily. 120 tablet 1  . sulfamethoxazole-trimethoprim (BACTRIM DS) 800-160 MG tablet Take 1 tablet by mouth 3 (three) times a week.  15 tablet 2  . zolpidem (AMBIEN) 10 MG tablet Take 10 mg by mouth at bedtime as needed for sleep.     Marland Kitchen albuterol (PROVENTIL HFA;VENTOLIN HFA) 108 (90 Base) MCG/ACT inhaler Inhale 2 puffs into the lungs every 6 (six) hours as needed for wheezing or shortness of breath. 1 Inhaler 2  . ipratropium-albuterol (DUONEB) 0.5-2.5 (3) MG/3ML SOLN Take 3 mLs by nebulization every 2 (two) hours as needed. 360 mL    No facility-administered medications prior to visit.     Review of Systems:   Constitutional:   No  weight loss, night sweats,  Fevers, chills,  +fatigue, or  lassitude.  HEENT:   No headaches,  Difficulty swallowing,  Tooth/dental problems, or  Sore throat,  No sneezing, itching, ear ache, nasal congestion, post nasal drip,   CV:  No chest pain,  Orthopnea, PND, swelling in lower extremities, anasarca, dizziness, palpitations, syncope.   GI  No heartburn, indigestion, abdominal pain, nausea, vomiting, diarrhea, change in bowel habits, loss of appetite, bloody stools.   Resp:    No chest wall deformity  Skin: no rash or lesions.  GU: no dysuria, change in color of urine, no urgency or frequency.  No flank pain, no hematuria   MS:  No joint pain or swelling.  No decreased range of motion.  No back pain.    Physical Exam  BP 122/84 (BP Location: Right Arm, Cuff Size: Normal)   Pulse 76   Temp 98.8 F (37.1 C) (Oral)   Ht _0  (1.6 m)   Wt 222 lb 6.4 oz (100.9 kg)   SpO2 100%   BMI 39.40 kg/m   GEN: A/Ox3; pleasant , NAD, on oxygen   HEENT:  [place]/AT,   NOSE-clear, THROAT-clear, no lesions, no postnasal drip or exudate noted.  Few scattered white patches along tongue and posterior pharynx.  NECK:  Supple w/ fair ROM; no JVD; normal carotid impulses w/o bruits; no thyromegaly or nodules palpated; no lymphadenopathy.    RESP  Clear  P & A; w/o, wheezes/ rales/ or rhonchi. no accessory muscle use, no dullness to percussion  CARD:  RRR, no m/r/g, no peripheral  edema, pulses intact, no cyanosis or clubbing.  GI:   Soft & nt; nml bowel sounds; no organomegaly or masses detected.   Musco: Warm bil, no deformities or joint swelling noted.   Neuro: alert, no focal deficits noted.    Skin: Warm, no lesions or rashes    Lab Results:  CBC  BMET  Imaging: No results found.    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  High-resolution CT chest is pending for next week.  Patient's pulmonary function testing today shows moderate restriction and a decreased diffusing capacity. No perceived benefit with Symbicort.  She does have a smoking history   For now can use DuoNeb twice daily.  Stop Symbicort. We will continue on steroids for now.  On return visit after high-resolution CT chest can decide about a slow taper to off.  Suspect a lot of her ongoing symptoms are component of deconditioning as well.  Pulmonary rehab is a great idea for her. She has no increased oxygen mans in fact today oxygen mans are decreased.  Plan  Patient Instructions  Stop Symbicort.  Begin Duoneb Twice daily.  Albuterol inhaler As needed   Activity as tolerated.  Mycelex troche five times daily for 1 week  Prednisone 51m daily until seen back in office  CT chest next week as planned  Pulmonary rehab as planned  Decrease Oxygen to 2l/m rest and 3l/m with activity .  Order for small O2 tanks.  Follow up with Dr. MVaughan Browneror [place] NP in  2 weeks and As needed   Please contact office for  sooner follow up if symptoms do not improve or worsen or seek emergency care          Chronic respiratory failure with hypoxia (HCC) Decreased O2 demands.  Will decrease oxygen to 2 L at rest and 3 L with activity to maintain O2 saturations greater than 88 to 90%  Obstructive sleep apnea CPAP intolerant.  Oral candidiasis Oral care discussed. Mycelex troche x1 week. Discontinue Symbicort for now  Plan  Patient Instructions  Stop Symbicort.  Begin Duoneb Twice daily.  Albuterol inhaler As needed   Activity as tolerated.  Mycelex troche five times daily for 1 week  Prednisone 37m daily until seen back in office  CT chest next week as planned  Pulmonary rehab as planned  Decrease Oxygen to 2l/m rest and 3l/m with activity .  Order for small O2 tanks.  Follow up with Dr. MVaughan Browneror Dejha King NP in 2 weeks and As needed   Please contact office for sooner follow up if symptoms do not improve or worsen or seek emergency care             TRexene Edison NP 09/06/2020

## 2020-09-16 ENCOUNTER — Other Ambulatory Visit: Payer: Medicare Other

## 2020-09-22 ENCOUNTER — Telehealth: Payer: Self-pay | Admitting: Adult Health

## 2020-09-22 NOTE — Telephone Encounter (Signed)
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  High-resolution CT chest is pending for next week.  Patient's pulmonary function testing today shows moderate restriction and a decreased diffusing capacity. No perceived benefit with Symbicort.  She does have a smoking history   For now can use DuoNeb twice daily.  Stop Symbicort. We will continue on steroids for now.  On return visit after high-resolution CT chest can decide about a slow taper to off.  Suspect a lot of her ongoing symptoms are component of deconditioning as well.  Pulmonary rehab is a great idea for her. She has no increased oxygen mans in fact today oxygen mans are decreased.  Plan Patient Instructions  Stop Symbicort.  Begin Duoneb Twice daily.  Albuterol inhaler As needed   Activity as tolerated.  Mycelex troche five times daily for 1 week  Prednisone 54m daily until seen back in office  CT chest next week as planned  Pulmonary rehab as planned  Decrease Oxygen to 2l/m rest and 3l/m with activity .  Order for small O2 tanks.  Follow up with Dr. MVaughan Browneror Parrett NP in 2 weeks and As needed   Please contact office for sooner follow up if symptoms do not improve or worsen or seek emergency care  Chronic respiratory failure with hypoxia ([place] Decreased O2 demands.  Will decrease oxygen to 2 L at rest and 3 L with activity to maintain O2 saturations greater than 88 to 90%  Obstructive sleep apnea CPAP intolerant.  Oral candidiasis Oral care discussed. Mycelex troche x1 week. Discontinue Symbicort for now   Called and spoke with pt who stated she ran out of the prednisone about 2 days ago. Pt stated since running out of it, she has not been feeling like herself. Pt states that she has been a little more SOB since being out of the medication. Pt states that her O2 sats have dropped to the low 80s on 3L O2. Pt also states  that she has been feeling shaky and also states her skin is breaking out almost like it has a rash on it; states it is mainly on her face.  Pt states that she is doing the duoneb twice a day and has not been using the Symbicort as directed by TP.  Due to pt's symptoms and also since she did run out of the prednisone prior to her upcoming appt, pt wants to know what could be recommended if we might be able to send Rx to the pharmacy for her. Also, pt has been having problems trying to get her O2 tanks refilled so she has enough to be able to bring with her to the doctor when she comes. Due to this and also due to concerns about possibly not having transportation for her upcoming appt with TP 2/14, pt wants to know if it would be okay to change that OV to a televisit.  Dr. MVaughan Browner please advise.

## 2020-09-23 MED ORDER — PREDNISONE 10 MG PO TABS: 10.0000 mg | ORAL_TABLET | Freq: Every day | ORAL | 0 refills | Status: DC

## 2020-09-23 NOTE — Telephone Encounter (Signed)
Pease renew the prednisone 10 mg/day till she can be revaluated and CT scan (scheduled for2/18) completed and reviewed.  Ok to convert upcoming visit to tele

## 2020-09-23 NOTE — Telephone Encounter (Signed)
Called spoke with patient Let her know Dr. Matilde Bash recommendations Verified pharmacy  Orders placed Nothing further needed

## 2020-09-27 ENCOUNTER — Other Ambulatory Visit: Payer: Self-pay

## 2020-09-27 ENCOUNTER — Ambulatory Visit (INDEPENDENT_AMBULATORY_CARE_PROVIDER_SITE_OTHER): Payer: Medicare Other | Admitting: Adult Health

## 2020-09-27 ENCOUNTER — Telehealth: Payer: Self-pay | Admitting: *Deleted

## 2020-09-27 DIAGNOSIS — J189 Pneumonia, unspecified organism: Secondary | ICD-10-CM

## 2020-09-27 MED ORDER — PREDNISONE 10 MG PO TABS: 10.0000 mg | ORAL_TABLET | Freq: Every day | ORAL | 1 refills | Status: DC

## 2020-09-27 MED ORDER — IPRATROPIUM-ALBUTEROL 0.5-2.5 (3) MG/3ML IN SOLN: 3.0000 mL | Freq: Four times a day (QID) | RESPIRATORY_TRACT | 5 refills | Status: DC | PRN

## 2020-09-27 NOTE — Progress Notes (Signed)
Virtual Visit via Telephone Note  I connected with Tiffany Coleman on 09/27/20 at 10:30 AM EST by telephone and verified that I am speaking with the correct person using two identifiers.  Location: Patient: Home  Provider: Office    I discussed the limitations, risks, security and privacy concerns of performing an evaluation and management service by telephone and the availability of in person appointments. I also discussed with the patient that there may be a patient responsible charge related to this service. The patient expressed understanding and agreed to proceed.   History of Present Illness: 65 year old female former smoker quit in October 2021 with a known history of asthma and obstructive sleep apnea with CPAP intolerance Patient had COVID-19 infection in August 7829 complicated by Covid pneumonia, acute respiratory failure and post Covid pneumonitis.  She required treatment with steroids, remdesivir high flow oxygen and Baricitinib ).  Treatment with IV steroids and diuresis.  And was discharged on a slow prednisone taper.  Today's televisit is a 1 month follow-up for slow to resolve COPD exacerbation and ongoing respiratory symptoms from COVID-19.  Last visit patient complained of ongoing cough shortness of breath.  She has had 2 hospitalizations since severe Covid infection in August 2021.  She was found to have post Covid pneumonitis and decompensated heart failure.  CT chest showed bilateral groundglass opacities.  She has been discharged on oxygen 4 L.  Last visit patient was recommended to decrease oxygen to 2 L at rest and 3 L with activity.  She had been having more trouble since discontinuing steroids.  She had been restarted on prednisone.  And referred to pulmonary rehab.  Pulmonary function testing August 31, 2020 showed moderate restriction with an FEV1 at 69% ratio is 95 FVC 56% no significant bronchodilator response and a DLCO at 44%.  An order was also sent for  smaller portable tanks to help her with her independence.  She has an upcoming high-resolution CT chest later this week.  She was also started on DuoNeb twice daily.  Treated for thrush with Mycelex thrush.   Since last visit patient is feeling  Says she ran out of prednisone for 4 days, due to pharmacy issues. Says she is now back on prednisone , starting to feel better.  Has some nasal drainage and dry cough .  Remains on Oxygen 2l/m rest and 3l/m , O2 sats 99%. Has not gotten any portable tanks.  Starts pulmonary rehab tomorrow.   Past Medical History:  Diagnosis Date  . Anemia yrs ago  . Arthritis    knees  . Bronchitis    hx  . Complication of anesthesia    trouble breathing when wake up needs breathing tx when wakes up  . Hx of colonic polyps 2015  . Obstructive sleep apnea 06/02/2016   No CPAP used  . Recurrent ventral hernia 09/08/2013  . Umbilical hernia    Current Outpatient Medications on File Prior to Visit  Medication Sig Dispense Refill  . albuterol (VENTOLIN HFA) 108 (90 Base) MCG/ACT inhaler Inhale 2 puffs into the lungs every 6 (six) hours as needed for wheezing or shortness of breath. 1 each 2  . clotrimazole (MYCELEX) 10 MG troche Take 1 tablet (10 mg total) by mouth 5 (five) times daily. 35 Troche 0  . furosemide (LASIX) 20 MG tablet Take 20 mg by mouth daily.    Marland Kitchen gabapentin (NEURONTIN) 300 MG capsule Take 300 mg by mouth 3 (three) times daily as needed (nerve pain).     Marland Kitchen  ipratropium-albuterol (DUONEB) 0.5-2.5 (3) MG/3ML SOLN Take 3 mLs by nebulization every 2 (two) hours as needed. 360 mL   . pantoprazole (PROTONIX) 40 MG tablet Take 1 tablet (40 mg total) by mouth daily at 6 (six) AM. 30 tablet 2  . predniSONE (DELTASONE) 10 MG tablet Take 1 tablet (10 mg total) by mouth daily with breakfast. 20 tablet 0  . zolpidem (AMBIEN) 10 MG tablet Take 10 mg by mouth at bedtime as needed for sleep.     Marland Kitchen acetaminophen (TYLENOL) 325 MG tablet Take 2 tablets (650 mg total)  by mouth every 6 (six) hours as needed for mild pain or headache (fever >/= 101). (Patient not taking: Reported on 09/27/2020)    . fluticasone (FLONASE) 50 MCG/ACT nasal spray Place 2 sprays into both nostrils daily. (Patient not taking: Reported on 09/27/2020)  2   No current facility-administered medications on file prior to visit.      Observations/Objective: Patient speaks in full sentences. No audible distress.     Assessment and Plan: Post Covid pneumonitis/postinflammatory fibrosis-patient appears to have steroid responsiveness with perceived clinical benefit. High-resolution CT chest is pending later this week. Follow-up on those results. She is to continue on prednisone 10 mg daily until seen back in the office in 4 weeks. If improving on return will begin slow taper to off.  Continue on pulmonary hygiene regimen  Pulmonary rehab.   O2 RF- no increased O2 demands. Order for POC  eval on return.   Plan  Patient Instructions  Continue on Duoneb Twice daily.  Albuterol inhaler As needed   Activity as tolerated.  Prednisone 32m daily until seen back in office  CT chest this week as planned  Pulmonary rehab as planned this week  Decrease Oxygen to 2l/m rest and 3l/m with activity .  Order for POC  Follow up with Dr. MVaughan Browneror Parrett NP in 4-6  weeks and As needed   Please contact office for sooner follow up if symptoms do not improve or worsen or seek emergency care       Follow Up Instructions: Follow up in 4 weeks and As needed      I discussed the assessment and treatment plan with the patient. The patient was provided an opportunity to ask questions and all were answered. The patient agreed with the plan and demonstrated an understanding of the instructions.   The patient was advised to call back or seek an in-person evaluation if the symptoms worsen or if the condition fails to improve as anticipated.  I provided 25 minutes of non-face-to-face time during this  encounter.   TRexene Edison NP

## 2020-09-27 NOTE — Telephone Encounter (Signed)
Called and left vm for patient to return call to schedule f/u with Dr. Vaughan Browner for 4-6 weeks.  Dr. Vaughan Browner has openings starting March 16th.  Will await return call from patient to schedule.

## 2020-09-27 NOTE — Patient Instructions (Signed)
Continue on Duoneb Twice daily.  Albuterol inhaler As needed   Activity as tolerated.  Prednisone 58m daily until seen back in office  CT chest this week as planned  Pulmonary rehab as planned this week  Decrease Oxygen to 2l/m rest and 3l/m with activity .  Order for POC  Follow up with Dr. MVaughan Browneror Janiel Crisostomo NP in 4-6  weeks and As needed   Please contact office for sooner follow up if symptoms do not improve or worsen or seek emergency care

## 2020-09-28 ENCOUNTER — Telehealth (HOSPITAL_COMMUNITY): Payer: Self-pay | Admitting: *Deleted

## 2020-10-01 ENCOUNTER — Ambulatory Visit
Admission: RE | Admit: 2020-10-01 | Discharge: 2020-10-01 | Disposition: A | Discharge status: Home / Self Care | Payer: Medicare Other | Source: Ambulatory Visit | Attending: Pulmonary Disease | Admitting: Pulmonary Disease

## 2020-10-01 ENCOUNTER — Encounter (HOSPITAL_COMMUNITY): Payer: Self-pay

## 2020-10-01 ENCOUNTER — Other Ambulatory Visit: Payer: Self-pay

## 2020-10-01 ENCOUNTER — Encounter (HOSPITAL_COMMUNITY)
Admission: RE | Admit: 2020-10-01 | Discharge: 2020-10-01 | Disposition: A | Discharge status: Home / Self Care | Payer: Medicare Other | Source: Ambulatory Visit | Attending: Pulmonary Disease | Admitting: Pulmonary Disease

## 2020-10-01 VITALS — BP 130/84 | HR 81 | Ht 63.0 in | Wt 233.7 lb

## 2020-10-01 DIAGNOSIS — R0602 Shortness of breath: Secondary | ICD-10-CM

## 2020-10-01 DIAGNOSIS — U071 COVID-19: Secondary | ICD-10-CM | POA: Insufficient documentation

## 2020-10-01 DIAGNOSIS — Z87891 Personal history of nicotine dependence: Secondary | ICD-10-CM | POA: Insufficient documentation

## 2020-10-01 DIAGNOSIS — J9601 Acute respiratory failure with hypoxia: Secondary | ICD-10-CM | POA: Diagnosis not present

## 2020-10-01 NOTE — Progress Notes (Signed)
Pulmonary Individual Treatment Plan  Patient Details  Name: Tiffany Coleman MRN: [id number] Date of Birth: 30-Jul-1956 Referring Provider:   April Manson Pulmonary Rehab Walk Test from 10/01/2020 in [place]  Referring Provider Dr. Vaughan Browner      Initial Encounter Date:  Flowsheet Row Pulmonary Rehab Walk Test from 10/01/2020 in [place]  Date 10/01/20      Visit Diagnosis: Acute hypoxemic respiratory failure due to COVID-19 [facility])  Patient's Home Medications on Admission:   Current Outpatient Medications:  .  acetaminophen (TYLENOL) 325 MG tablet, Take 2 tablets (650 mg total) by mouth every 6 (six) hours as needed for mild pain or headache (fever >/= 101). (Patient not taking: Reported on 09/27/2020), Disp: , Rfl:  .  albuterol (VENTOLIN HFA) 108 (90 Base) MCG/ACT inhaler, Inhale 2 puffs into the lungs every 6 (six) hours as needed for wheezing or shortness of breath., Disp: 1 each, Rfl: 2 .  clotrimazole (MYCELEX) 10 MG troche, Take 1 tablet (10 mg total) by mouth 5 (five) times daily., Disp: 35 Troche, Rfl: 0 .  fluticasone (FLONASE) 50 MCG/ACT nasal spray, Place 2 sprays into both nostrils daily. (Patient not taking: Reported on 09/27/2020), Disp: , Rfl: 2 .  furosemide (LASIX) 20 MG tablet, Take 20 mg by mouth daily., Disp: , Rfl:  .  gabapentin (NEURONTIN) 300 MG capsule, Take 300 mg by mouth 3 (three) times daily as needed (nerve pain). , Disp: , Rfl:  .  ipratropium-albuterol (DUONEB) 0.5-2.5 (3) MG/3ML SOLN, Take 3 mLs by nebulization every 6 (six) hours as needed., Disp: 360 mL, Rfl: 5 .  pantoprazole (PROTONIX) 40 MG tablet, Take 1 tablet (40 mg total) by mouth daily at 6 (six) AM., Disp: 30 tablet, Rfl: 2 .  predniSONE (DELTASONE) 10 MG tablet, Take 1 tablet (10 mg total) by mouth daily with breakfast., Disp: 20 tablet, Rfl: 0 .  predniSONE (DELTASONE) 10 MG tablet, Take 1 tablet (10 mg total) by mouth  daily with breakfast., Disp: 30 tablet, Rfl: 1 .  zolpidem (AMBIEN) 10 MG tablet, Take 10 mg by mouth at bedtime as needed for sleep. , Disp: , Rfl:   Past Medical History: Past Medical History:  Diagnosis Date  . Anemia yrs ago  . Arthritis    knees  . Bronchitis    hx  . Complication of anesthesia    trouble breathing when wake up needs breathing tx when wakes up  . Hx of colonic polyps 2015  . Obstructive sleep apnea 06/02/2016   No CPAP used  . Recurrent ventral hernia 09/08/2013  . Umbilical hernia     Tobacco Use: Social History   Tobacco Use  Smoking Status Former Smoker  . Years: 20.00  . Types: Cigarettes  . Quit date: 05/26/2020  . Years since quitting: 0.3  Smokeless Tobacco Never Used  Tobacco Comment   smokes 2-3 cigs per day     Labs: Recent Review Flowsheet Data    Labs for ITP Cardiac and Pulmonary Rehab Latest Ref Rng & Units 01/19/2017 11/02/2017 04/25/2019 04/05/2020 04/25/2020   Trlycerides <150 mg/dL - - - 148 -   Hemoglobin A1c 4.8 - 5.6 % 5.5 5.9 5.5 - 6.7(H)   HCO3 20.0 - 28.0 mmol/L - - - 26.4 -   TCO2 0 - 100 mmol/L - - - - -   O2SAT % - - - 38.5 -      Capillary Blood Glucose: Lab Results  Component Value Date   GLUCAP 111 (H) 05/08/2020   GLUCAP 176 (H) 05/07/2020   GLUCAP 107 (H) 05/07/2020   GLUCAP 200 (H) 05/06/2020   GLUCAP 226 (H) 05/06/2020     Pulmonary Assessment Scores:  Pulmonary Assessment Scores    Row Name 10/01/20 1439         ADL UCSD   ADL Phase Entry     SOB Score total 54           CAT Score   CAT Score 30           mMRC Score   mMRC Score 4           UCSD: Self-administered rating of dyspnea associated with activities of daily living (ADLs) 6-point scale (0 = "not at all" to 5 = "maximal or unable to do because of breathlessness")  Scoring Scores range from 0 to 120.  Minimally important difference is 5 units  CAT: CAT can identify the health impairment of COPD patients and is better correlated  with disease progression.  CAT has a scoring range of zero to 40. The CAT score is classified into four groups of low (less than 10), medium (10 - 20), high (21-30) and very high (31-40) based on the impact level of disease on health status. A CAT score over 10 suggests significant symptoms.  A worsening CAT score could be explained by an exacerbation, poor medication adherence, poor inhaler technique, or progression of COPD or comorbid conditions.  CAT MCID is 2 points  mMRC: mMRC (Modified Medical Research Council) Dyspnea Scale is used to assess the degree of baseline functional disability in patients of respiratory disease due to dyspnea. No minimal important difference is established. A decrease in score of 1 point or greater is considered a positive change.   Pulmonary Function Assessment:  Pulmonary Function Assessment - 10/01/20 1053      Breath   Shortness of Breath Yes;Limiting activity;Fear of Shortness of Breath           Exercise Target Goals: Exercise Program Goal: Individual exercise prescription set using results from initial 6 min walk test and THRR while considering  patient's activity barriers and safety.   Exercise Prescription Goal: Initial exercise prescription builds to 30-45 minutes a day of aerobic activity, 2-3 days per week.  Home exercise guidelines will be given to patient during program as part of exercise prescription that the participant will acknowledge.  Activity Barriers & Risk Stratification:  Activity Barriers & Cardiac Risk Stratification - 10/01/20 1051      Activity Barriers & Cardiac Risk Stratification   Activity Barriers Deconditioning;Shortness of Breath;Assistive Device;Balance Concerns           6 Minute Walk:  6 Minute Walk    Row Name 10/01/20 1410         6 Minute Walk   Phase Initial     Distance 950 feet     Walk Time 6 minutes     # of Rest Breaks 0     MPH 1.79     METS 2.31     RPE 11     Perceived Dyspnea  2      VO2 Peak 8.09     Symptoms Yes (comment)     Comments chronic back pain and shortness of breath     Resting HR 81 bpm     Resting BP 130/84     Resting Oxygen Saturation  99 %  Exercise Oxygen Saturation  during 6 min walk 83 %     Max Ex. HR 127 bpm     Max Ex. BP 166/86     2 Minute Post BP 142/80           Interval HR   1 Minute HR 106     2 Minute HR 110     3 Minute HR 114     4 Minute HR 113     5 Minute HR 124     6 Minute HR 127     2 Minute Post HR 80     Interval Heart Rate? Yes           Interval Oxygen   Interval Oxygen? Yes     Baseline Oxygen Saturation % 99 %     1 Minute Oxygen Saturation % 88 %     1 Minute Liters of Oxygen 3 L     2 Minute Oxygen Saturation % 83 %     2 Minute Liters of Oxygen 4 L     3 Minute Oxygen Saturation % 91 %     3 Minute Liters of Oxygen 4 L     4 Minute Oxygen Saturation % 90 %     4 Minute Liters of Oxygen 4 L     5 Minute Oxygen Saturation % 87 %     5 Minute Liters of Oxygen 4 L     6 Minute Oxygen Saturation % 84 %     6 Minute Liters of Oxygen 4 L     2 Minute Post Oxygen Saturation % 100 %     2 Minute Post Liters of Oxygen 4 L            Oxygen Initial Assessment:  Oxygen Initial Assessment - 10/01/20 1052      Home Oxygen   Home Oxygen Device E-Tanks;Home Concentrator    Sleep Oxygen Prescription Continuous    Liters per minute 3    Home Exercise Oxygen Prescription Continuous    Liters per minute 3    Home Resting Oxygen Prescription Continuous    Liters per minute 3    Compliance with Home Oxygen Use Yes      Initial 6 min Walk   Oxygen Used Continuous    Liters per minute 4      Program Oxygen Prescription   Program Oxygen Prescription Continuous    Liters per minute 4    Comments Pt may need to be on 5 liters during exercise based off of 6 minute walk test. On 4L, oxygen saturation dropped to 84% in the last minute of walk test.      Intervention   Short Term Goals To learn and exhibit  compliance with exercise, home and travel O2 prescription;To learn and understand importance of monitoring SPO2 with pulse oximeter and demonstrate accurate use of the pulse oximeter.;To learn and understand importance of maintaining oxygen saturations>88%;To learn and demonstrate proper use of respiratory medications;To learn and demonstrate proper pursed lip breathing techniques or other breathing techniques.    Long  Term Goals Exhibits compliance with exercise, home and travel O2 prescription;Verbalizes importance of monitoring SPO2 with pulse oximeter and return demonstration;Maintenance of O2 saturations>88%;Exhibits proper breathing techniques, such as pursed lip breathing or other method taught during program session;Compliance with respiratory medication;Demonstrates proper use of MDI's           Oxygen Re-Evaluation:   Oxygen Discharge (Final Oxygen Re-Evaluation):  Initial Exercise Prescription:  Initial Exercise Prescription - 10/01/20 1400      Date of Initial Exercise RX and Referring Provider   Date 10/01/20    Referring Provider Dr. Vaughan Browner    Expected Discharge Date 11/25/20      Oxygen   Oxygen Continuous    Liters 4-5      NuStep   Level 1    SPM 80    Minutes 30      Prescription Details   Frequency (times per week) 2    Duration Progress to 45 minutes of aerobic exercise without signs/symptoms of physical distress      Intensity   THRR 40-80% of Max Heartrate 62-123    Ratings of Perceived Exertion 11-13    Perceived Dyspnea 0-4      Progression   Progression Continue to progress workloads to maintain intensity without signs/symptoms of physical distress.      Resistance Training   Training Prescription Yes    Weight orange bands    Reps 10-15           Perform Capillary Blood Glucose checks as needed.  Exercise Prescription Changes:   Exercise Comments:   Exercise Goals and Review:  Exercise Goals    Row Name 10/01/20 1409              Exercise Goals   Increase Physical Activity Yes       Intervention Provide advice, education, support and counseling about physical activity/exercise needs.;Develop an individualized exercise prescription for aerobic and resistive training based on initial evaluation findings, risk stratification, comorbidities and participant's personal goals.       Expected Outcomes Short Term: Attend rehab on a regular basis to increase amount of physical activity.;Long Term: Add in home exercise to make exercise part of routine and to increase amount of physical activity.;Long Term: Exercising regularly at least 3-5 days a week.       Increase Strength and Stamina Yes       Intervention Provide advice, education, support and counseling about physical activity/exercise needs.;Develop an individualized exercise prescription for aerobic and resistive training based on initial evaluation findings, risk stratification, comorbidities and participant's personal goals.       Expected Outcomes Short Term: Increase workloads from initial exercise prescription for resistance, speed, and METs.;Short Term: Perform resistance training exercises routinely during rehab and add in resistance training at home;Long Term: Improve cardiorespiratory fitness, muscular endurance and strength as measured by increased METs and functional capacity (6MWT)       Able to understand and use rate of perceived exertion (RPE) scale Yes       Intervention Provide education and explanation on how to use RPE scale       Expected Outcomes Short Term: Able to use RPE daily in rehab to express subjective intensity level;Long Term:  Able to use RPE to guide intensity level when exercising independently       Able to understand and use Dyspnea scale Yes       Intervention Provide education and explanation on how to use Dyspnea scale       Expected Outcomes Short Term: Able to use Dyspnea scale daily in rehab to express subjective sense of shortness  of breath during exertion;Long Term: Able to use Dyspnea scale to guide intensity level when exercising independently       Knowledge and understanding of Target Heart Rate Range (THRR) Yes       Intervention Provide education and explanation of  THRR including how the numbers were predicted and where they are located for reference       Expected Outcomes Long Term: Able to use THRR to govern intensity when exercising independently;Short Term: Able to state/look up THRR;Short Term: Able to use daily as guideline for intensity in rehab       Understanding of Exercise Prescription Yes       Intervention Provide education, explanation, and written materials on patient's individual exercise prescription       Expected Outcomes Short Term: Able to explain program exercise prescription;Long Term: Able to explain home exercise prescription to exercise independently              Exercise Goals Re-Evaluation :   Discharge Exercise Prescription (Final Exercise Prescription Changes):   Nutrition:  Target Goals: Understanding of nutrition guidelines, daily intake of sodium <1540m, cholesterol <2048m calories 30% from fat and 7% or less from saturated fats, daily to have 5 or more servings of fruits and vegetables.  Biometrics:  Pre Biometrics - 10/01/20 1409      Pre Biometrics   Grip Strength 25 kg            Nutrition Therapy Plan and Nutrition Goals:   Nutrition Assessments:  MEDIFICTS Score Key:  ?70 Need to make dietary changes   40-70 Heart Healthy Diet  ? 40 Therapeutic Level Cholesterol Diet   Picture Your Plate Scores:  <4<61nhealthy dietary pattern with much room for improvement.  41-50 Dietary pattern unlikely to meet recommendations for good health and room for improvement.  51-60 More healthful dietary pattern, with some room for improvement.   >60 Healthy dietary pattern, although there may be some specific behaviors that could be improved.    Nutrition  Goals Re-Evaluation:   Nutrition Goals Discharge (Final Nutrition Goals Re-Evaluation):   Psychosocial: Target Goals: Acknowledge presence or absence of significant depression and/or stress, maximize coping skills, provide positive support system. Participant is able to verbalize types and ability to use techniques and skills needed for reducing stress and depression.  Initial Review & Psychosocial Screening:  Initial Psych Review & Screening - 10/01/20 1054      Initial Review   Current issues with Current Sleep Concerns      Family Dynamics   Good Support System? No   Has 3 kids that live local but they have their own lives   Strains Illness and family care strain    Concerns No support system    Comments Has 3 kids that live close but they are busy with their own lives and they dont help much      Barriers   Psychosocial barriers to participate in program The patient should benefit from training in stress management and relaxation.      Screening Interventions   Interventions Encouraged to exercise    Expected Outcomes Long Term Goal: Stressors or current issues are controlled or eliminated.;Long Term goal: The participant improves quality of Life and PHQ9 Scores as seen by post scores and/or verbalization of changes           Quality of Life Scores:  Scores of 19 and below usually indicate a poorer quality of life in these areas.  A difference of  2-3 points is a clinically meaningful difference.  A difference of 2-3 points in the total score of the Quality of Life Index has been associated with significant improvement in overall quality of life, self-image, physical symptoms, and general health in studies assessing change  in quality of life.  PHQ-9: Recent Review Flowsheet Data    Depression screen [facility] 2/9 10/01/2020 11/02/2017 05/29/2017 03/06/2017 01/19/2017   Decreased Interest 0 0 0 0 0   Down, Depressed, Hopeless 2 0 0 0 1    PHQ - 2 Score 2 0 0 0 1   Altered sleeping 2  - - - -   Tired, decreased energy 3 - - - -   Change in appetite 0 - - - -   Feeling bad or failure about yourself  0 - - - -   Trouble concentrating 0 - - - -   Moving slowly or fidgety/restless 0 - - - -   Suicidal thoughts 0 - - - -   PHQ-9 Score 7 - - - -   Difficult doing work/chores Somewhat difficult - - - -     Interpretation of Total Score  Total Score Depression Severity:  1-4 = Minimal depression, 5-9 = Mild depression, 10-14 = Moderate depression, 15-19 = Moderately severe depression, 20-27 = Severe depression   Psychosocial Evaluation and Intervention:   Psychosocial Re-Evaluation:   Psychosocial Discharge (Final Psychosocial Re-Evaluation):   Education: Education Goals: Education classes will be provided on a weekly basis, covering required topics. Participant will state understanding/return demonstration of topics presented.  Learning Barriers/Preferences:  Learning Barriers/Preferences - 10/01/20 1057      Learning Barriers/Preferences   Learning Barriers Inability to learn new things   Has a learning disability   Learning Preferences Skilled Demonstration;Individual Instruction           Education Topics: Risk Factor Reduction:  -Group instruction that is supported by a PowerPoint presentation. Instructor discusses the definition of a risk factor, different risk factors for pulmonary disease, and how the heart and lungs work together.     Nutrition for Pulmonary Patient:  -Group instruction provided by PowerPoint slides, verbal discussion, and written materials to support subject matter. The instructor gives an explanation and review of healthy diet recommendations, which includes a discussion on weight management, recommendations for fruit and vegetable consumption, as well as protein, fluid, caffeine, fiber, sodium, sugar, and alcohol. Tips for eating when patients are short of breath are discussed.   Pursed Lip Breathing:  -Group instruction that is  supported by demonstration and informational handouts. Instructor discusses the benefits of pursed lip and diaphragmatic breathing and detailed demonstration on how to preform both.     Oxygen Safety:  -Group instruction provided by PowerPoint, verbal discussion, and written material to support subject matter. There is an overview of "What is Oxygen" and "Why do we need it".  Instructor also reviews how to create a safe environment for oxygen use, the importance of using oxygen as prescribed, and the risks of noncompliance. There is a brief discussion on traveling with oxygen and resources the patient may utilize.   Oxygen Equipment:  -Group instruction provided by [facility] Staff utilizing handouts, written materials, and equipment demonstrations.   Signs and Symptoms:  -Group instruction provided by written material and verbal discussion to support subject matter. Warning signs and symptoms of infection, stroke, and heart attack are reviewed and when to call the physician/911 reinforced. Tips for preventing the spread of infection discussed.   Advanced Directives:  -Group instruction provided by verbal instruction and written material to support subject matter. Instructor reviews Advanced Directive laws and proper instruction for filling out document.   Pulmonary Video:  -Group video education that reviews the importance of medication and oxygen compliance,  exercise, good nutrition, pulmonary hygiene, and pursed lip and diaphragmatic breathing for the pulmonary patient.   Exercise for the Pulmonary Patient:  -Group instruction that is supported by a PowerPoint presentation. Instructor discusses benefits of exercise, core components of exercise, frequency, duration, and intensity of an exercise routine, importance of utilizing pulse oximetry during exercise, safety while exercising, and options of places to exercise outside of rehab.     Pulmonary Medications:  -Verbally interactive  group education provided by instructor with focus on inhaled medications and proper administration.   Anatomy and Physiology of the Respiratory System and Intimacy:  -Group instruction provided by PowerPoint, verbal discussion, and written material to support subject matter. Instructor reviews respiratory cycle and anatomical components of the respiratory system and their functions. Instructor also reviews differences in obstructive and restrictive respiratory diseases with examples of each. Intimacy, Sex, and Sexuality differences are reviewed with a discussion on how relationships can change when diagnosed with pulmonary disease. Common sexual concerns are reviewed.   MD DAY -A group question and answer session with a medical doctor that allows participants to ask questions that relate to their pulmonary disease state.   OTHER EDUCATION -Group or individual verbal, written, or video instructions that support the educational goals of the pulmonary rehab program.   Holiday Eating Survival Tips:  -Group instruction provided by PowerPoint slides, verbal discussion, and written materials to support subject matter. The instructor gives patients tips, tricks, and techniques to help them not only survive but enjoy the holidays despite the onslaught of food that accompanies the holidays.   Knowledge Questionnaire Score:   Core Components/Risk Factors/Patient Goals at Admission:  Personal Goals and Risk Factors at Admission - 10/01/20 1418      Core Components/Risk Factors/Patient Goals on Admission    Weight Management Yes;Obesity    Intervention Weight Management: Develop a combined nutrition and exercise program designed to reach desired caloric intake, while maintaining appropriate intake of nutrient and fiber, sodium and fats, and appropriate energy expenditure required for the weight goal.;Weight Management: Provide education and appropriate resources to help participant work on and attain  dietary goals.;Weight Management/Obesity: Establish reasonable short term and long term weight goals.;Obesity: Provide education and appropriate resources to help participant work on and attain dietary goals.    Improve shortness of breath with ADL's Yes    Intervention Provide education, individualized exercise plan and daily activity instruction to help decrease symptoms of SOB with activities of daily living.    Expected Outcomes Short Term: Improve cardiorespiratory fitness to achieve a reduction of symptoms when performing ADLs;Long Term: Be able to perform more ADLs without symptoms or delay the onset of symptoms           Core Components/Risk Factors/Patient Goals Review:    Core Components/Risk Factors/Patient Goals at Discharge (Final Review):    ITP Comments:   Comments:

## 2020-10-01 NOTE — Progress Notes (Signed)
Mckaila arrived today for her pulmonary rehab orientation/walk test appt.  Pt complains of back pain worsen when she takes a deep breath.  This discomfort radiates to her shoulders and around her trunk. Visually inspected her trunk and back area for any lesions or rash.  Pt had shingles some time ago. Ascultation of her lungs rales and rhonchi dispersed throughout with very little air movement. Ot ib 3LNC with o2 saturation 99%.  Pt complains of feeling swollen.  Pt with swelling in her hands (unknown if this is her baseline)  Ankles with no swelling but has pedal edema.  Pt admits that she has missed her diuretic - Lasix 20 mg for at least 2 weeks.  Questioned pt on why.  Pt with no financial inability to purchase her medications.  Ahja thought that since she was feeling "better" she didn't need to take it and forgot she even had the medication until she was going through a second bag of medications.  Pt is a poor historian of her medical issues and often answers in the affirmative during this consultation.  Pt ambulated about 50 feet to weigh with audible difficulty with her breathing and increase of pain in her back.  Pt weight 106 kg/233.6 lbs.  Pt reports this is up from her normal weight in the 220 range  Pt weighed at the doctors office on 1/24 - 222.  Called and left message with Dr. Jeanie Cooks answering service requesting urgent call back for further instructions. Cherre Huger, BSN Cardiac and Training and development officer

## 2020-10-01 NOTE — Progress Notes (Signed)
No response from message left with answering service one hour ago.  Called back and was transferred to voicemail.  Left urgent message for return call on instructions on how best to proceed. Cherre Huger, BSN Cardiac and Training and development officer

## 2020-10-01 NOTE — Progress Notes (Signed)
Tiffany Coleman 65 y.o. female Pulmonary Rehab Orientation Note This patient who was referred to Pulmonary rehab by Dr. Vaughan Browner with the diagnosis of COVID-19 Pneumonitis arrived today in Cardiac and Pulmonary Rehab. She arrived ambulatory using rollator with unsteady gait. Pt was very short of breath from walking into the department. Pt was also complaining of persistent back pain for the last couple of days. Notified RN who spent time discussing symptoms and also contacted the pt's MD to discuss plan. Pt's doctors office never returned call. We continued with orientation and 6 minute walk test, as the patient was insisting on going through with it. She does carry portable oxygen. Per pt, she uses oxygen continuously. Color good, skin warm and dry. Patient is oriented to time and place. Patient's medical history, psychosocial health, and medications reviewed. Psychosocial assessment reveals pt lives alone. Pt is currently retired. Pt hobbies include cooking. Pt reports her stress level is moderate. Areas of stress/anxiety include Health.  Pt does not exhibit signs of depression. PHQ2/9 score 2/7. Pt shows fair  coping skills with positive outlook . Shalicia was offered emotional support and reassurance. Will continue to monitor and evaluate progress toward psychosocial goal(s) of improving quality of life. Patient reports she does take medications as prescribed. Patient states she follows a Regular diet. The patient reports no specific efforts to gain or lose weight.. Patient's weight will be monitored closely. Demonstration and practice of PLB using pulse oximeter. Patient able to return demonstration satisfactorily. Safety and hand hygiene in the exercise area reviewed with patient. Patient voices understanding of the information reviewed. Department expectations discussed with patient and achievable goals were set. The patient shows enthusiasm about attending the program and we look forward to working  with this nice lady. The patient completed a 6 min walk test today and is scheduled to begin exercise on 10/05/20.  45 minutes was spent on a variety of activities such as assessment of the patient, obtaining baseline data including height, weight, BMI, and grip strength, verifying medical history, allergies, and current medications, and teaching patient strategies for performing tasks with less respiratory effort with emphasis on pursed lip breathing.  Rick Duff MS, ACSM CEP

## 2020-10-05 ENCOUNTER — Other Ambulatory Visit: Payer: Self-pay

## 2020-10-05 ENCOUNTER — Telehealth: Payer: Self-pay | Admitting: Pulmonary Disease

## 2020-10-05 ENCOUNTER — Encounter (HOSPITAL_COMMUNITY)
Admission: RE | Admit: 2020-10-05 | Discharge: 2020-10-05 | Disposition: A | Discharge status: Home / Self Care | Payer: Medicare Other | Source: Ambulatory Visit | Attending: Pulmonary Disease | Admitting: Pulmonary Disease

## 2020-10-05 VITALS — Wt 234.1 lb

## 2020-10-05 DIAGNOSIS — J9601 Acute respiratory failure with hypoxia: Secondary | ICD-10-CM

## 2020-10-05 DIAGNOSIS — U071 COVID-19: Secondary | ICD-10-CM | POA: Diagnosis not present

## 2020-10-05 NOTE — Progress Notes (Signed)
Daily Session Note  Patient Details  Name: Tiffany Coleman MRN: [id number] Date of Birth: 02/13/1956 Referring Provider:   April Manson Pulmonary Rehab Walk Test from 10/01/2020 in [facility]  Referring Provider Dr. Vaughan Browner      Encounter Date: 10/05/2020  Check In:  Session Check In - 10/05/20 1448      Check-In   Supervising physician immediately available to respond to emergencies [facility] immediately available    Physician(s) Dr.Ghimire    Location [facility]    Staff Present Rodney Langton, RN;Jessica Hassell Done, MS, ACSM-CEP, Exercise Physiologist;Joan Leonia Reeves, RN, BSN    Virtual Visit No    Medication changes reported     No    Fall or balance concerns reported    No    Tobacco Cessation No Change    Warm-up and Cool-down Performed on first and last piece of equipment    Resistance Training Performed Yes    VAD Patient? No    PAD/SET Patient? No      Pain Assessment   Currently in Pain? No/denies    Pain Score 0-No pain    Multiple Pain Sites No           Capillary Blood Glucose: No results found for this or any previous visit (from the past 24 hour(s)).   Exercise Prescription Changes - 10/05/20 1500      Response to Exercise   Blood Pressure (Admit) 135/79    Blood Pressure (Exercise) 130/80    Blood Pressure (Exit) 117/75    Heart Rate (Admit) 83 bpm    Heart Rate (Exercise) 105 bpm    Heart Rate (Exit) 84 bpm    Oxygen Saturation (Admit) 100 %    Oxygen Saturation (Exercise) 95 %    Oxygen Saturation (Exit) 100 %    Rating of Perceived Exertion (Exercise) 11    Perceived Dyspnea (Exercise) 1    Duration Continue with 30 min of aerobic exercise without signs/symptoms of physical distress.    Intensity THRR unchanged   40-80% HR max     Progression   Progression Continue to progress workloads to maintain intensity without signs/symptoms of physical distress.      Resistance Training   Training  Prescription Yes    Weight orange bands    Reps 10-15    Time 10 Minutes      Oxygen   Oxygen Continuous    Liters 3-4      NuStep   Level 1    SPM 80    Minutes 30    METs 2.1           Social History   Tobacco Use  Smoking Status Former Smoker  . Years: 20.00  . Types: Cigarettes  . Quit date: 05/26/2020  . Years since quitting: 0.3  Smokeless Tobacco Never Used  Tobacco Comment   smokes 2-3 cigs per day     Goals Met:  Proper associated with RPD/PD & O2 Sat Exercise tolerated well No report of cardiac concerns or symptoms Strength training completed today  Goals Unmet:  Not Applicable  Comments: Service time is from 1300 to 1440    Dr. Fransico Him is Medical Director for Cardiac Rehab at [facility].

## 2020-10-07 ENCOUNTER — Other Ambulatory Visit: Payer: Self-pay

## 2020-10-07 ENCOUNTER — Encounter (HOSPITAL_COMMUNITY)
Admission: RE | Admit: 2020-10-07 | Discharge: 2020-10-07 | Disposition: A | Discharge status: Home / Self Care | Payer: Medicare Other | Source: Ambulatory Visit | Attending: Pulmonary Disease | Admitting: Pulmonary Disease

## 2020-10-07 DIAGNOSIS — J9601 Acute respiratory failure with hypoxia: Secondary | ICD-10-CM

## 2020-10-07 DIAGNOSIS — U071 COVID-19: Secondary | ICD-10-CM

## 2020-10-07 NOTE — Progress Notes (Signed)
Daily Session Note  Patient Details  Name: Tiffany Coleman MRN: [id number] Date of Birth: 22-Sep-1955 Referring Provider:   April Manson Pulmonary Rehab Walk Test from 10/01/2020 in [facility]  Referring Provider Dr. Vaughan Browner      Encounter Date: 10/07/2020  Check In:  Session Check In - 10/07/20 1412      Check-In   Supervising physician immediately available to respond to emergencies [facility] immediately available    Physician(s) Dr. Tyrell Antonio    Location [facility]    Staff Present Rodney Langton, RN;Coraleigh Sheeran Hassell Done, MS, ACSM-CEP, Exercise Physiologist;Joan Leonia Reeves, RN, BSN    Virtual Visit No    Medication changes reported     No    Fall or balance concerns reported    No    Tobacco Cessation No Change    Warm-up and Cool-down Performed on first and last piece of equipment    Resistance Training Performed Yes    VAD Patient? No    PAD/SET Patient? No      Pain Assessment   Currently in Pain? No/denies    Pain Score 0-No pain    Multiple Pain Sites No           Capillary Blood Glucose: No results found for this or any previous visit (from the past 24 hour(s)).    Social History   Tobacco Use  Smoking Status Former Smoker  . Years: 20.00  . Types: Cigarettes  . Quit date: 05/26/2020  . Years since quitting: 0.3  Smokeless Tobacco Never Used  Tobacco Comment   smokes 2-3 cigs per day     Goals Met:  Proper associated with RPD/PD & O2 Sat Exercise tolerated well No report of cardiac concerns or symptoms Strength training completed today  Goals Unmet:  Not Applicable  Comments: Service time is from 1300 to 1420    Dr. Fransico Him is Medical Director for Cardiac Rehab at [facility].

## 2020-10-08 NOTE — Telephone Encounter (Signed)
Nothing noted in message. Will close encounter.  

## 2020-10-12 ENCOUNTER — Telehealth (HOSPITAL_COMMUNITY): Payer: Self-pay | Admitting: Internal Medicine

## 2020-10-12 ENCOUNTER — Encounter (HOSPITAL_COMMUNITY)
Admission: RE | Admit: 2020-10-12 | Discharge: 2020-10-12 | Disposition: A | Discharge status: Home / Self Care | Payer: Medicare Other | Source: Ambulatory Visit | Attending: Pulmonary Disease | Admitting: Pulmonary Disease

## 2020-10-12 DIAGNOSIS — J9601 Acute respiratory failure with hypoxia: Secondary | ICD-10-CM | POA: Insufficient documentation

## 2020-10-12 DIAGNOSIS — U071 COVID-19: Secondary | ICD-10-CM | POA: Insufficient documentation

## 2020-10-12 NOTE — Progress Notes (Signed)
Pulmonary Individual Treatment Plan  Patient Details  Name: Tiffany Coleman MRN: [id number] Date of Birth: 08/13/1956 Referring Provider:   April Manson Pulmonary Rehab Walk Test from 10/01/2020 in [place]  Referring Provider Dr. Vaughan Browner      Initial Encounter Date:  Flowsheet Row Pulmonary Rehab Walk Test from 10/01/2020 in [place]  Date 10/01/20      Visit Diagnosis: Acute hypoxemic respiratory failure due to COVID-19 [facility])  Patient's Home Medications on Admission:   Current Outpatient Medications:  .  acetaminophen (TYLENOL) 325 MG tablet, Take 2 tablets (650 mg total) by mouth every 6 (six) hours as needed for mild pain or headache (fever >/= 101). (Patient not taking: Reported on 09/27/2020), Disp: , Rfl:  .  albuterol (VENTOLIN HFA) 108 (90 Base) MCG/ACT inhaler, Inhale 2 puffs into the lungs every 6 (six) hours as needed for wheezing or shortness of breath., Disp: 1 each, Rfl: 2 .  clotrimazole (MYCELEX) 10 MG troche, Take 1 tablet (10 mg total) by mouth 5 (five) times daily., Disp: 35 Troche, Rfl: 0 .  fluticasone (FLONASE) 50 MCG/ACT nasal spray, Place 2 sprays into both nostrils daily. (Patient not taking: Reported on 09/27/2020), Disp: , Rfl: 2 .  furosemide (LASIX) 20 MG tablet, Take 20 mg by mouth daily., Disp: , Rfl:  .  gabapentin (NEURONTIN) 300 MG capsule, Take 300 mg by mouth 3 (three) times daily as needed (nerve pain). , Disp: , Rfl:  .  ipratropium-albuterol (DUONEB) 0.5-2.5 (3) MG/3ML SOLN, Take 3 mLs by nebulization every 6 (six) hours as needed., Disp: 360 mL, Rfl: 5 .  pantoprazole (PROTONIX) 40 MG tablet, Take 1 tablet (40 mg total) by mouth daily at 6 (six) AM., Disp: 30 tablet, Rfl: 2 .  predniSONE (DELTASONE) 10 MG tablet, Take 1 tablet (10 mg total) by mouth daily with breakfast., Disp: 20 tablet, Rfl: 0 .  predniSONE (DELTASONE) 10 MG tablet, Take 1 tablet (10 mg total) by mouth daily  with breakfast., Disp: 30 tablet, Rfl: 1 .  zolpidem (AMBIEN) 10 MG tablet, Take 10 mg by mouth at bedtime as needed for sleep. , Disp: , Rfl:   Past Medical History: Past Medical History:  Diagnosis Date  . Anemia yrs ago  . Arthritis    knees  . Bronchitis    hx  . Complication of anesthesia    trouble breathing when wake up needs breathing tx when wakes up  . Hx of colonic polyps 2015  . Obstructive sleep apnea 06/02/2016   No CPAP used  . Recurrent ventral hernia 09/08/2013  . Umbilical hernia     Tobacco Use: Social History   Tobacco Use  Smoking Status Former Smoker  . Years: 20.00  . Types: Cigarettes  . Quit date: 05/26/2020  . Years since quitting: 0.3  Smokeless Tobacco Never Used  Tobacco Comment   smokes 2-3 cigs per day     Labs: Recent Review Flowsheet Data    Labs for ITP Cardiac and Pulmonary Rehab Latest Ref Rng & Units 01/19/2017 11/02/2017 04/25/2019 04/05/2020 04/25/2020   Trlycerides <150 mg/dL - - - 148 -   Hemoglobin A1c 4.8 - 5.6 % 5.5 5.9 5.5 - 6.7(H)   HCO3 20.0 - 28.0 mmol/L - - - 26.4 -   TCO2 0 - 100 mmol/L - - - - -   O2SAT % - - - 38.5 -      Capillary Blood Glucose: Lab Results  Component Value Date   GLUCAP 111 (H) 05/08/2020   GLUCAP 176 (H) 05/07/2020   GLUCAP 107 (H) 05/07/2020   GLUCAP 200 (H) 05/06/2020   GLUCAP 226 (H) 05/06/2020     Pulmonary Assessment Scores:  Pulmonary Assessment Scores    Row Name 10/01/20 1439         ADL UCSD   ADL Phase Entry     SOB Score total 54           CAT Score   CAT Score 30           mMRC Score   mMRC Score 4           UCSD: Self-administered rating of dyspnea associated with activities of daily living (ADLs) 6-point scale (0 = "not at all" to 5 = "maximal or unable to do because of breathlessness")  Scoring Scores range from 0 to 120.  Minimally important difference is 5 units  CAT: CAT can identify the health impairment of COPD patients and is better correlated with  disease progression.  CAT has a scoring range of zero to 40. The CAT score is classified into four groups of low (less than 10), medium (10 - 20), high (21-30) and very high (31-40) based on the impact level of disease on health status. A CAT score over 10 suggests significant symptoms.  A worsening CAT score could be explained by an exacerbation, poor medication adherence, poor inhaler technique, or progression of COPD or comorbid conditions.  CAT MCID is 2 points  mMRC: mMRC (Modified Medical Research Council) Dyspnea Scale is used to assess the degree of baseline functional disability in patients of respiratory disease due to dyspnea. No minimal important difference is established. A decrease in score of 1 point or greater is considered a positive change.   Pulmonary Function Assessment:  Pulmonary Function Assessment - 10/01/20 1053      Breath   Shortness of Breath Yes;Limiting activity;Fear of Shortness of Breath           Exercise Target Goals: Exercise Program Goal: Individual exercise prescription set using results from initial 6 min walk test and THRR while considering  patient's activity barriers and safety.   Exercise Prescription Goal: Initial exercise prescription builds to 30-45 minutes a day of aerobic activity, 2-3 days per week.  Home exercise guidelines will be given to patient during program as part of exercise prescription that the participant will acknowledge.  Activity Barriers & Risk Stratification:  Activity Barriers & Cardiac Risk Stratification - 10/01/20 1051      Activity Barriers & Cardiac Risk Stratification   Activity Barriers Deconditioning;Shortness of Breath;Assistive Device;Balance Concerns           6 Minute Walk:  6 Minute Walk    Row Name 10/01/20 1410         6 Minute Walk   Phase Initial     Distance 950 feet     Walk Time 6 minutes     # of Rest Breaks 0     MPH 1.79     METS 2.31     RPE 11     Perceived Dyspnea  2     VO2  Peak 8.09     Symptoms Yes (comment)     Comments chronic back pain and shortness of breath     Resting HR 81 bpm     Resting BP 130/84     Resting Oxygen Saturation  99 %  Exercise Oxygen Saturation  during 6 min walk 83 %     Max Ex. HR 127 bpm     Max Ex. BP 166/86     2 Minute Post BP 142/80           Interval HR   1 Minute HR 106     2 Minute HR 110     3 Minute HR 114     4 Minute HR 113     5 Minute HR 124     6 Minute HR 127     2 Minute Post HR 80     Interval Heart Rate? Yes           Interval Oxygen   Interval Oxygen? Yes     Baseline Oxygen Saturation % 99 %     1 Minute Oxygen Saturation % 88 %     1 Minute Liters of Oxygen 3 L     2 Minute Oxygen Saturation % 83 %     2 Minute Liters of Oxygen 4 L     3 Minute Oxygen Saturation % 91 %     3 Minute Liters of Oxygen 4 L     4 Minute Oxygen Saturation % 90 %     4 Minute Liters of Oxygen 4 L     5 Minute Oxygen Saturation % 87 %     5 Minute Liters of Oxygen 4 L     6 Minute Oxygen Saturation % 84 %     6 Minute Liters of Oxygen 4 L     2 Minute Post Oxygen Saturation % 100 %     2 Minute Post Liters of Oxygen 4 L            Oxygen Initial Assessment:  Oxygen Initial Assessment - 10/01/20 1052      Home Oxygen   Home Oxygen Device E-Tanks;Home Concentrator    Sleep Oxygen Prescription Continuous    Liters per minute 3    Home Exercise Oxygen Prescription Continuous    Liters per minute 3    Home Resting Oxygen Prescription Continuous    Liters per minute 3    Compliance with Home Oxygen Use Yes      Initial 6 min Walk   Oxygen Used Continuous    Liters per minute 4      Program Oxygen Prescription   Program Oxygen Prescription Continuous    Liters per minute 4    Comments Pt may need to be on 5 liters during exercise based off of 6 minute walk test. On 4L, oxygen saturation dropped to 84% in the last minute of walk test.      Intervention   Short Term Goals To learn and exhibit  compliance with exercise, home and travel O2 prescription;To learn and understand importance of monitoring SPO2 with pulse oximeter and demonstrate accurate use of the pulse oximeter.;To learn and understand importance of maintaining oxygen saturations>88%;To learn and demonstrate proper use of respiratory medications;To learn and demonstrate proper pursed lip breathing techniques or other breathing techniques.    Long  Term Goals Exhibits compliance with exercise, home and travel O2 prescription;Verbalizes importance of monitoring SPO2 with pulse oximeter and return demonstration;Maintenance of O2 saturations>88%;Exhibits proper breathing techniques, such as pursed lip breathing or other method taught during program session;Compliance with respiratory medication;Demonstrates proper use of MDI's           Oxygen Re-Evaluation:  Oxygen Re-Evaluation    Row Name  10/11/20 1425             Program Oxygen Prescription   Program Oxygen Prescription Continuous       Liters per minute 3       Comments Pt only requires 3L of oxygen for seated exercise. If she was to walk in the future she does need to be on 4L with walking.               Home Oxygen   Home Oxygen Device E-Tanks;Home Concentrator       Sleep Oxygen Prescription Continuous       Liters per minute 3       Home Exercise Oxygen Prescription Continuous       Liters per minute 3       Home Resting Oxygen Prescription Continuous       Liters per minute 3       Compliance with Home Oxygen Use Yes               Goals/Expected Outcomes   Short Term Goals To learn and exhibit compliance with exercise, home and travel O2 prescription;To learn and understand importance of monitoring SPO2 with pulse oximeter and demonstrate accurate use of the pulse oximeter.;To learn and understand importance of maintaining oxygen saturations>88%;To learn and demonstrate proper use of respiratory medications;To learn and demonstrate proper pursed lip  breathing techniques or other breathing techniques.       Long  Term Goals Exhibits compliance with exercise, home and travel O2 prescription;Verbalizes importance of monitoring SPO2 with pulse oximeter and return demonstration;Maintenance of O2 saturations>88%;Exhibits proper breathing techniques, such as pursed lip breathing or other method taught during program session;Compliance with respiratory medication;Demonstrates proper use of MDI's       Comments Pt needs cues for pursed lip breathing. Pt also thinks when she is short of breath she needs to increase her liter flow of oxygen. Pt needs to understand that her shortness of breath will not be solved by increasing liter flow. Have discussed this with pt more than once.       Goals/Expected Outcomes compliance and understanding of oxygen saturation and pursed lip breathing. Understanding and compliance of prescribed liter flow with exertion.              Oxygen Discharge (Final Oxygen Re-Evaluation):  Oxygen Re-Evaluation - 10/11/20 1425      Program Oxygen Prescription   Program Oxygen Prescription Continuous    Liters per minute 3    Comments Pt only requires 3L of oxygen for seated exercise. If she was to walk in the future she does need to be on 4L with walking.      Home Oxygen   Home Oxygen Device E-Tanks;Home Concentrator    Sleep Oxygen Prescription Continuous    Liters per minute 3    Home Exercise Oxygen Prescription Continuous    Liters per minute 3    Home Resting Oxygen Prescription Continuous    Liters per minute 3    Compliance with Home Oxygen Use Yes      Goals/Expected Outcomes   Short Term Goals To learn and exhibit compliance with exercise, home and travel O2 prescription;To learn and understand importance of monitoring SPO2 with pulse oximeter and demonstrate accurate use of the pulse oximeter.;To learn and understand importance of maintaining oxygen saturations>88%;To learn and demonstrate proper use of  respiratory medications;To learn and demonstrate proper pursed lip breathing techniques or other breathing techniques.    Long  Term  Goals Exhibits compliance with exercise, home and travel O2 prescription;Verbalizes importance of monitoring SPO2 with pulse oximeter and return demonstration;Maintenance of O2 saturations>88%;Exhibits proper breathing techniques, such as pursed lip breathing or other method taught during program session;Compliance with respiratory medication;Demonstrates proper use of MDI's    Comments Pt needs cues for pursed lip breathing. Pt also thinks when she is short of breath she needs to increase her liter flow of oxygen. Pt needs to understand that her shortness of breath will not be solved by increasing liter flow. Have discussed this with pt more than once.    Goals/Expected Outcomes compliance and understanding of oxygen saturation and pursed lip breathing. Understanding and compliance of prescribed liter flow with exertion.           Initial Exercise Prescription:  Initial Exercise Prescription - 10/01/20 1400      Date of Initial Exercise RX and Referring Provider   Date 10/01/20    Referring Provider Dr. Vaughan Browner    Expected Discharge Date 11/25/20      Oxygen   Oxygen Continuous    Liters 4-5      NuStep   Level 1    SPM 80    Minutes 30      Prescription Details   Frequency (times per week) 2    Duration Progress to 45 minutes of aerobic exercise without signs/symptoms of physical distress      Intensity   THRR 40-80% of Max Heartrate 62-123    Ratings of Perceived Exertion 11-13    Perceived Dyspnea 0-4      Progression   Progression Continue to progress workloads to maintain intensity without signs/symptoms of physical distress.      Resistance Training   Training Prescription Yes    Weight orange bands    Reps 10-15           Perform Capillary Blood Glucose checks as needed.  Exercise Prescription Changes:  Exercise Prescription  Changes    Row Name 10/05/20 1500 10/11/20 1300           Response to Exercise   Blood Pressure (Admit) 135/79 --      Blood Pressure (Exercise) 130/80 --      Blood Pressure (Exit) 117/75 --      Heart Rate (Admit) 83 bpm --      Heart Rate (Exercise) 105 bpm --      Heart Rate (Exit) 84 bpm --      Oxygen Saturation (Admit) 100 % --      Oxygen Saturation (Exercise) 95 % --      Oxygen Saturation (Exit) 100 % --      Rating of Perceived Exertion (Exercise) 11 --      Perceived Dyspnea (Exercise) 1 --      Duration Continue with 30 min of aerobic exercise without signs/symptoms of physical distress. --      Intensity THRR unchanged  40-80% HR max --             Progression   Progression Continue to progress workloads to maintain intensity without signs/symptoms of physical distress. --      Average METs -- 2             Resistance Training   Training Prescription Yes --      Weight orange bands --      Reps 10-15 --      Time 10 Minutes --  Oxygen   Oxygen Continuous --      Liters 3-4 --             NuStep   Level 1 --      SPM 80 --      Minutes 30 --      METs 2.1 --             Exercise Comments:  Exercise Comments    Row Name 10/05/20 1456           Exercise Comments Patient completed first day of exercise and tolerated well for the most part. She is deconditioned and has chronic back pain which limits her range of motion and mobility. She was able to do 30 minutes on the Nustep and also did resistance training. Her only complaint was chronic back pain. Will continue to monitor and progress.              Exercise Goals and Review:  Exercise Goals    Row Name 10/01/20 1409             Exercise Goals   Increase Physical Activity Yes       Intervention Provide advice, education, support and counseling about physical activity/exercise needs.;Develop an individualized exercise prescription for aerobic and resistive training based on  initial evaluation findings, risk stratification, comorbidities and participant's personal goals.       Expected Outcomes Short Term: Attend rehab on a regular basis to increase amount of physical activity.;Long Term: Add in home exercise to make exercise part of routine and to increase amount of physical activity.;Long Term: Exercising regularly at least 3-5 days a week.       Increase Strength and Stamina Yes       Intervention Provide advice, education, support and counseling about physical activity/exercise needs.;Develop an individualized exercise prescription for aerobic and resistive training based on initial evaluation findings, risk stratification, comorbidities and participant's personal goals.       Expected Outcomes Short Term: Increase workloads from initial exercise prescription for resistance, speed, and METs.;Short Term: Perform resistance training exercises routinely during rehab and add in resistance training at home;Long Term: Improve cardiorespiratory fitness, muscular endurance and strength as measured by increased METs and functional capacity (6MWT)       Able to understand and use rate of perceived exertion (RPE) scale Yes       Intervention Provide education and explanation on how to use RPE scale       Expected Outcomes Short Term: Able to use RPE daily in rehab to express subjective intensity level;Long Term:  Able to use RPE to guide intensity level when exercising independently       Able to understand and use Dyspnea scale Yes       Intervention Provide education and explanation on how to use Dyspnea scale       Expected Outcomes Short Term: Able to use Dyspnea scale daily in rehab to express subjective sense of shortness of breath during exertion;Long Term: Able to use Dyspnea scale to guide intensity level when exercising independently       Knowledge and understanding of Target Heart Rate Range (THRR) Yes       Intervention Provide education and explanation of THRR  including how the numbers were predicted and where they are located for reference       Expected Outcomes Long Term: Able to use THRR to govern intensity when exercising independently;Short Term: Able to state/look up THRR;Short Term: Able to  use daily as guideline for intensity in rehab       Understanding of Exercise Prescription Yes       Intervention Provide education, explanation, and written materials on patient's individual exercise prescription       Expected Outcomes Short Term: Able to explain program exercise prescription;Long Term: Able to explain home exercise prescription to exercise independently              Exercise Goals Re-Evaluation :  Exercise Goals Re-Evaluation    Row Name 10/11/20 1346 10/11/20 1420           Exercise Goal Re-Evaluation   Exercise Goals Review Increase Physical Activity;Increase Strength and Stamina;Able to understand and use rate of perceived exertion (RPE) scale;Able to understand and use Dyspnea scale;Knowledge and understanding of Target Heart Rate Range (THRR);Understanding of Exercise Prescription (P)  Increase Physical Activity;Increase Strength and Stamina;Able to understand and use rate of perceived exertion (RPE) scale;Able to understand and use Dyspnea scale;Knowledge and understanding of Target Heart Rate Range (THRR);Understanding of Exercise Prescription      Comments -- Pt has completed 2 exercise sessions. She is very deconditioned and is dependent on staff to help her with equipment and exercises. It is too early to see any progression, but will continue to monitor and progress as she is able. She is currently exercising at 1.9 METS on the Nustep. She is able to do the resistance bands but has to do them seated due to balance concerns.      Expected Outcomes -- Through exercise at rehab and home the patient will decrease shortness of breath with daily activities and feel confident in carrying out and exercise regimn at home.              Discharge Exercise Prescription (Final Exercise Prescription Changes):  Exercise Prescription Changes - 10/11/20 1300      Progression   Average METs 2           Nutrition:  Target Goals: Understanding of nutrition guidelines, daily intake of sodium <155m, cholesterol <2044m calories 30% from fat and 7% or less from saturated fats, daily to have 5 or more servings of fruits and vegetables.  Biometrics:  Pre Biometrics - 10/01/20 1409      Pre Biometrics   Grip Strength 25 kg            Nutrition Therapy Plan and Nutrition Goals:  Nutrition Therapy & Goals - 10/07/20 0913      Nutrition Therapy   RD appointment deferred Yes           Nutrition Assessments:  MEDIFICTS Score Key:  ?70 Need to make dietary changes   40-70 Heart Healthy Diet  ? 40 Therapeutic Level Cholesterol Diet   Picture Your Plate Scores:  <4<24nhealthy dietary pattern with much room for improvement.  41-50 Dietary pattern unlikely to meet recommendations for good health and room for improvement.  51-60 More healthful dietary pattern, with some room for improvement.   >60 Healthy dietary pattern, although there may be some specific behaviors that could be improved.    Nutrition Goals Re-Evaluation:   Nutrition Goals Discharge (Final Nutrition Goals Re-Evaluation):   Psychosocial: Target Goals: Acknowledge presence or absence of significant depression and/or stress, maximize coping skills, provide positive support system. Participant is able to verbalize types and ability to use techniques and skills needed for reducing stress and depression.  Initial Review & Psychosocial Screening:  Initial Psych Review & Screening - 10/01/20 1054  Initial Review   Current issues with Current Sleep Concerns      Family Dynamics   Good Support System? No   Has 3 kids that live local but they have their own lives   Strains Illness and family care strain    Concerns No support  system    Comments Has 3 kids that live close but they are busy with their own lives and they dont help much      Barriers   Psychosocial barriers to participate in program The patient should benefit from training in stress management and relaxation.      Screening Interventions   Interventions Encouraged to exercise    Expected Outcomes Long Term Goal: Stressors or current issues are controlled or eliminated.;Long Term goal: The participant improves quality of Life and PHQ9 Scores as seen by post scores and/or verbalization of changes           Quality of Life Scores:  Scores of 19 and below usually indicate a poorer quality of life in these areas.  A difference of  2-3 points is a clinically meaningful difference.  A difference of 2-3 points in the total score of the Quality of Life Index has been associated with significant improvement in overall quality of life, self-image, physical symptoms, and general health in studies assessing change in quality of life.  PHQ-9: Recent Review Flowsheet Data    Depression screen [facility] 2/9 10/01/2020 11/02/2017 05/29/2017 03/06/2017 01/19/2017   Decreased Interest 0 0 0 0 0   Down, Depressed, Hopeless 2 0 0 0 1    PHQ - 2 Score 2 0 0 0 1   Altered sleeping 2 - - - -   Tired, decreased energy 3 - - - -   Change in appetite 0 - - - -   Feeling bad or failure about yourself  0 - - - -   Trouble concentrating 0 - - - -   Moving slowly or fidgety/restless 0 - - - -   Suicidal thoughts 0 - - - -   PHQ-9 Score 7 - - - -   Difficult doing work/chores Somewhat difficult - - - -     Interpretation of Total Score  Total Score Depression Severity:  1-4 = Minimal depression, 5-9 = Mild depression, 10-14 = Moderate depression, 15-19 = Moderately severe depression, 20-27 = Severe depression   Psychosocial Evaluation and Intervention:   Psychosocial Re-Evaluation:  Psychosocial Re-Evaluation    Row Name 10/11/20 1316             Psychosocial  Re-Evaluation   Current issues with Current Sleep Concerns       Comments Annalaya just started pulmonary rehab, has attended 2 exercise sessions, psychosocial status has not changed since orientation/walk test.       Expected Outcomes For Almeter to handle her stress in healthy ways.       Interventions Encouraged to attend Pulmonary Rehabilitation for the exercise;Relaxation education;Stress management education       Continue Psychosocial Services  Follow up required by staff              Psychosocial Discharge (Final Psychosocial Re-Evaluation):  Psychosocial Re-Evaluation - 10/11/20 1316      Psychosocial Re-Evaluation   Current issues with Current Sleep Concerns    Comments Blondell just started pulmonary rehab, has attended 2 exercise sessions, psychosocial status has not changed since orientation/walk test.    Expected Outcomes For Juanita to handle her stress in  healthy ways.    Interventions Encouraged to attend Pulmonary Rehabilitation for the exercise;Relaxation education;Stress management education    Continue Psychosocial Services  Follow up required by staff           Education: Education Goals: Education classes will be provided on a weekly basis, covering required topics. Participant will state understanding/return demonstration of topics presented.  Learning Barriers/Preferences:  Learning Barriers/Preferences - 10/01/20 1057      Learning Barriers/Preferences   Learning Barriers Inability to learn new things   Has a learning disability   Learning Preferences Skilled Demonstration;Individual Instruction           Education Topics: Risk Factor Reduction:  -Group instruction that is supported by a PowerPoint presentation. Instructor discusses the definition of a risk factor, different risk factors for pulmonary disease, and how the heart and lungs work together.     Nutrition for Pulmonary Patient:  -Group instruction provided by PowerPoint slides,  verbal discussion, and written materials to support subject matter. The instructor gives an explanation and review of healthy diet recommendations, which includes a discussion on weight management, recommendations for fruit and vegetable consumption, as well as protein, fluid, caffeine, fiber, sodium, sugar, and alcohol. Tips for eating when patients are short of breath are discussed.   Pursed Lip Breathing:  -Group instruction that is supported by demonstration and informational handouts. Instructor discusses the benefits of pursed lip and diaphragmatic breathing and detailed demonstration on how to preform both.     Oxygen Safety:  -Group instruction provided by PowerPoint, verbal discussion, and written material to support subject matter. There is an overview of "What is Oxygen" and "Why do we need it".  Instructor also reviews how to create a safe environment for oxygen use, the importance of using oxygen as prescribed, and the risks of noncompliance. There is a brief discussion on traveling with oxygen and resources the patient may utilize.   Oxygen Equipment:  -Group instruction provided by [facility] Staff utilizing handouts, written materials, and equipment demonstrations.   Signs and Symptoms:  -Group instruction provided by written material and verbal discussion to support subject matter. Warning signs and symptoms of infection, stroke, and heart attack are reviewed and when to call the physician/911 reinforced. Tips for preventing the spread of infection discussed.   Advanced Directives:  -Group instruction provided by verbal instruction and written material to support subject matter. Instructor reviews Advanced Directive laws and proper instruction for filling out document.   Pulmonary Video:  -Group video education that reviews the importance of medication and oxygen compliance, exercise, good nutrition, pulmonary hygiene, and pursed lip and diaphragmatic breathing for the  pulmonary patient.   Exercise for the Pulmonary Patient:  -Group instruction that is supported by a PowerPoint presentation. Instructor discusses benefits of exercise, core components of exercise, frequency, duration, and intensity of an exercise routine, importance of utilizing pulse oximetry during exercise, safety while exercising, and options of places to exercise outside of rehab.     Pulmonary Medications:  -Verbally interactive group education provided by instructor with focus on inhaled medications and proper administration.   Anatomy and Physiology of the Respiratory System and Intimacy:  -Group instruction provided by PowerPoint, verbal discussion, and written material to support subject matter. Instructor reviews respiratory cycle and anatomical components of the respiratory system and their functions. Instructor also reviews differences in obstructive and restrictive respiratory diseases with examples of each. Intimacy, Sex, and Sexuality differences are reviewed with a discussion on how relationships can change when  diagnosed with pulmonary disease. Common sexual concerns are reviewed.   MD DAY -A group question and answer session with a medical doctor that allows participants to ask questions that relate to their pulmonary disease state.   OTHER EDUCATION -Group or individual verbal, written, or video instructions that support the educational goals of the pulmonary rehab program. [place] from 10/05/2020 in [place]  Date 10/05/20  [facility] Plate]  Educator Ailene Ravel H/O  Instruction Review Code 1- Verbalizes Understanding      Holiday Eating Survival Tips:  -Group instruction provided by PowerPoint slides, verbal discussion, and written materials to support subject matter. The instructor gives patients tips, tricks, and techniques to help them not only survive but enjoy the holidays despite the onslaught of  food that accompanies the holidays.   Knowledge Questionnaire Score:   Core Components/Risk Factors/Patient Goals at Admission:  Personal Goals and Risk Factors at Admission - 10/01/20 1418      Core Components/Risk Factors/Patient Goals on Admission    Weight Management Yes;Obesity    Intervention Weight Management: Develop a combined nutrition and exercise program designed to reach desired caloric intake, while maintaining appropriate intake of nutrient and fiber, sodium and fats, and appropriate energy expenditure required for the weight goal.;Weight Management: Provide education and appropriate resources to help participant work on and attain dietary goals.;Weight Management/Obesity: Establish reasonable short term and long term weight goals.;Obesity: Provide education and appropriate resources to help participant work on and attain dietary goals.    Improve shortness of breath with ADL's Yes    Intervention Provide education, individualized exercise plan and daily activity instruction to help decrease symptoms of SOB with activities of daily living.    Expected Outcomes Short Term: Improve cardiorespiratory fitness to achieve a reduction of symptoms when performing ADLs;Long Term: Be able to perform more ADLs without symptoms or delay the onset of symptoms           Core Components/Risk Factors/Patient Goals Review:   Goals and Risk Factor Review    Row Name 10/11/20 1319             Core Components/Risk Factors/Patient Goals Review   Personal Goals Review Develop more efficient breathing techniques such as purse lipped breathing and diaphragmatic breathing and practicing self-pacing with activity.;Increase knowledge of respiratory medications and ability to use respiratory devices properly.;Improve shortness of breath with ADL's       Review Abbygayle is learning about living with supplemental oxygen which is quite a lifestyle change.  She has been on supplemental oxygen since  August 2021 due to lung scarring from Covid-19.She has attended 2 exercise sessions.       Expected Outcomes See admission goals.              Core Components/Risk Factors/Patient Goals at Discharge (Final Review):   Goals and Risk Factor Review - 10/11/20 1319      Core Components/Risk Factors/Patient Goals Review   Personal Goals Review Develop more efficient breathing techniques such as purse lipped breathing and diaphragmatic breathing and practicing self-pacing with activity.;Increase knowledge of respiratory medications and ability to use respiratory devices properly.;Improve shortness of breath with ADL's    Review Aneli is learning about living with supplemental oxygen which is quite a lifestyle change.  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    Expected Outcomes See admission goals.  ITP Comments:   Comments: ITP REVIEW Pt is making expected progress toward pulmonary rehab goals after completing 2 sessions. Recommend continued exercise, life style modification, education, and utilization of breathing techniques to increase stamina and strength and decrease shortness of breath with exertion.

## 2020-10-14 ENCOUNTER — Encounter (HOSPITAL_COMMUNITY)
Admission: RE | Admit: 2020-10-14 | Discharge: 2020-10-14 | Disposition: A | Discharge status: Home / Self Care | Payer: Medicare Other | Source: Ambulatory Visit | Attending: Pulmonary Disease | Admitting: Pulmonary Disease

## 2020-10-14 ENCOUNTER — Other Ambulatory Visit: Payer: Self-pay

## 2020-10-14 VITALS — Wt 234.0 lb

## 2020-10-14 DIAGNOSIS — J9601 Acute respiratory failure with hypoxia: Secondary | ICD-10-CM | POA: Diagnosis present

## 2020-10-14 DIAGNOSIS — U071 COVID-19: Secondary | ICD-10-CM | POA: Diagnosis not present

## 2020-10-14 NOTE — Progress Notes (Signed)
Tiffany Coleman 65 y.o. female Nutrition Note  Diagnosis: COVID Pneumonia, SOB  Past Medical History:  Diagnosis Date  . Anemia yrs ago  . Arthritis    knees  . Bronchitis    hx  . Complication of anesthesia    trouble breathing when wake up needs breathing tx when wakes up  . Hx of colonic polyps 2015  . Obstructive sleep apnea 06/02/2016   No CPAP used  . Recurrent ventral hernia 09/08/2013  . Umbilical hernia      Medications reviewed.   Current Outpatient Medications:  .  acetaminophen (TYLENOL) 325 MG tablet, Take 2 tablets (650 mg total) by mouth every 6 (six) hours as needed for mild pain or headache (fever >/= 101). (Patient not taking: Reported on 09/27/2020), Disp: , Rfl:  .  albuterol (VENTOLIN HFA) 108 (90 Base) MCG/ACT inhaler, Inhale 2 puffs into the lungs every 6 (six) hours as needed for wheezing or shortness of breath., Disp: 1 each, Rfl: 2 .  clotrimazole (MYCELEX) 10 MG troche, Take 1 tablet (10 mg total) by mouth 5 (five) times daily., Disp: 35 Troche, Rfl: 0 .  fluticasone (FLONASE) 50 MCG/ACT nasal spray, Place 2 sprays into both nostrils daily. (Patient not taking: Reported on 09/27/2020), Disp: , Rfl: 2 .  furosemide (LASIX) 20 MG tablet, Take 20 mg by mouth daily., Disp: , Rfl:  .  gabapentin (NEURONTIN) 300 MG capsule, Take 300 mg by mouth 3 (three) times daily as needed (nerve pain). , Disp: , Rfl:  .  ipratropium-albuterol (DUONEB) 0.5-2.5 (3) MG/3ML SOLN, Take 3 mLs by nebulization every 6 (six) hours as needed., Disp: 360 mL, Rfl: 5 .  pantoprazole (PROTONIX) 40 MG tablet, Take 1 tablet (40 mg total) by mouth daily at 6 (six) AM., Disp: 30 tablet, Rfl: 2 .  predniSONE (DELTASONE) 10 MG tablet, Take 1 tablet (10 mg total) by mouth daily with breakfast., Disp: 20 tablet, Rfl: 0 .  predniSONE (DELTASONE) 10 MG tablet, Take 1 tablet (10 mg total) by mouth daily with breakfast., Disp: 30 tablet, Rfl: 1 .  zolpidem (AMBIEN) 10 MG tablet, Take 10 mg by mouth  at bedtime as needed for sleep. , Disp: , Rfl:    Ht Readings from Last 1 Encounters:  10/01/20 _0  (1.6 m)     Wt Readings from Last 3 Encounters:  10/07/20 234 lb 2.1 oz (106.2 kg)  10/01/20 233 lb 11 oz (106 kg)  09/06/20 222 lb 6.4 oz (100.9 kg)     There is no height or weight on file to calculate BMI.   Social History   Tobacco Use  Smoking Status Former Smoker  . Years: 20.00  . Types: Cigarettes  . Quit date: 05/26/2020  . Years since quitting: 0.3  Smokeless Tobacco Never Used  Tobacco Comment   smokes 2-3 cigs per day       Nutrition Note  Spoke with pt. Nutrition Plan and Nutrition Survey goals reviewed with pt.   Pt with elevated glucose on CMP.  Pt with dx of CHF. Per discussion, pt does use canned/convenience foods often. Pt does not add salt to food. Pt does not eat out frequently. Pt has been working to reduce processed foods. We Reviewed label reading to reduce sodium. Reviewed sodium recommendation of 2000 mg/day. Pt is also trying to lose weight. Current intake: Breakfast: frosted mini wheats with milk Lunch: Tuna or [place] sandwich on whole wheat bread with slice of cheese Dinner: Baked [place] wing  and large sweet potato with can't believe its not butter Fluid: water, <1 diet soda per day, diet juices  Pt thinks she should increase veggie intake. She does eat a lot of fruit. We discussed balanced meals, portions, and fat reduction.  Pt expressed understanding of the information reviewed.   Nutrition Diagnosis ? Obese  III = 40+ related to excessive energy intake as evidenced by a  BMI 41.45 kg/m2  Nutrition Intervention ? Pt's individual nutrition plan reviewed with pt. ? Low sodium diet - Provided heart failure MNT from NCM  ? Continue client-centered nutrition education by RD, as part of interdisciplinary care.  Goal(s) ? Pt to identify and limit food sources of saturated fat, trans fat, refined carbohydrates and sodium ? Pt to  identify food quantities necessary to achieve weight loss of 6-24 lb at graduation from pulmonary rehab.   Plan:   Will provide client-centered nutrition education as part of interdisciplinary care  Monitor and evaluate progress toward nutrition goal with team.   Michaele Offer, MS, RDN, LDN

## 2020-10-14 NOTE — Progress Notes (Signed)
Daily Session Note  Patient Details  Name: Tiffany Coleman MRN: [id number] Date of Birth: 10-16-1955 Referring Provider:   April Manson Pulmonary Rehab Walk Test from 10/01/2020 in [place]  Referring Provider Dr. Vaughan Browner      Encounter Date: 10/14/2020  Check In:  Session Check In - 10/14/20 1425      Check-In   Supervising physician immediately available to respond to emergencies [facility] immediately available    Physician(s) Dr. Doristine Bosworth    Location [facility]    Staff Present Rosebud Poles, RN, BSN;Tynslee Bowlds Ysidro Evert, RN;Jessica Hassell Done, MS, ACSM-CEP, Exercise Physiologist    Virtual Visit No    Medication changes reported     No    Fall or balance concerns reported    No    Tobacco Cessation No Change    Warm-up and Cool-down Performed on first and last piece of equipment    Resistance Training Performed Yes    VAD Patient? No    PAD/SET Patient? No      Pain Assessment   Currently in Pain? No/denies    Multiple Pain Sites No           Capillary Blood Glucose: No results found for this or any previous visit (from the past 24 hour(s)).    Social History   Tobacco Use  Smoking Status Former Smoker  . Years: 20.00  . Types: Cigarettes  . Quit date: 05/26/2020  . Years since quitting: 0.3  Smokeless Tobacco Never Used  Tobacco Comment   smokes 2-3 cigs per day     Goals Met:  Exercise tolerated well No report of cardiac concerns or symptoms Strength training completed today  Goals Unmet:  Not Applicable  Comments: Service time is from 1300 to 1415    Dr. Fransico Him is Medical Director for Cardiac Rehab at [facility].

## 2020-10-19 ENCOUNTER — Other Ambulatory Visit: Payer: Self-pay

## 2020-10-19 ENCOUNTER — Encounter (HOSPITAL_COMMUNITY)
Admission: RE | Admit: 2020-10-19 | Discharge: 2020-10-19 | Disposition: A | Discharge status: Home / Self Care | Payer: Medicare Other | Source: Ambulatory Visit | Attending: Pulmonary Disease | Admitting: Pulmonary Disease

## 2020-10-19 VITALS — Wt 231.7 lb

## 2020-10-19 DIAGNOSIS — J9601 Acute respiratory failure with hypoxia: Secondary | ICD-10-CM

## 2020-10-19 DIAGNOSIS — U071 COVID-19: Secondary | ICD-10-CM | POA: Diagnosis not present

## 2020-10-19 NOTE — Progress Notes (Signed)
Daily Session Note  Patient Details  Name: Tiffany Coleman MRN: [id number] Date of Birth: 06/01/1956 Referring Provider:   April Manson Pulmonary Rehab Walk Test from 10/01/2020 in [place]  Referring Provider Dr. Vaughan Browner      Encounter Date: 10/19/2020  Check In:  Session Check In - 10/19/20 1424      Check-In   Supervising physician immediately available to respond to emergencies [facility] immediately available    Physician(s) Dr. Doristine Bosworth    Location [facility]    Staff Present Rosebud Poles, RN, BSN;Lisa Ysidro Evert, RN;Maryssa Giampietro Hassell Done, MS, ACSM-CEP, Exercise Physiologist    Virtual Visit No    Medication changes reported     No    Fall or balance concerns reported    No    Tobacco Cessation No Change    Warm-up and Cool-down Performed on first and last piece of equipment    Resistance Training Performed Yes    VAD Patient? No    PAD/SET Patient? No      Pain Assessment   Currently in Pain? No/denies           Capillary Blood Glucose: No results found for this or any previous visit (from the past 24 hour(s)).   Exercise Prescription Changes - 10/19/20 1500      Response to Exercise   Blood Pressure (Admit) 136/80    Blood Pressure (Exercise) 160/78    Blood Pressure (Exit) 130/80    Heart Rate (Admit) 104 bpm    Heart Rate (Exercise) 109 bpm    Heart Rate (Exit) 86 bpm    Oxygen Saturation (Admit) 100 %    Oxygen Saturation (Exercise) 93 %    Oxygen Saturation (Exit) 100 %    Rating of Perceived Exertion (Exercise) 10    Perceived Dyspnea (Exercise) 1    Duration Progress to 45 minutes of aerobic exercise without signs/symptoms of physical distress    Intensity Other (comment)   40-80% HR max     Progression   Progression Continue to progress workloads to maintain intensity without signs/symptoms of physical distress.      Resistance Training   Training Prescription Yes    Weight orange bands     Reps 10-15    Time 10 Minutes      Oxygen   Oxygen Continuous    Liters 3      NuStep   Level 2    SPM 80    Minutes 30    METs 2.2           Social History   Tobacco Use  Smoking Status Former Smoker  . Years: 20.00  . Types: Cigarettes  . Quit date: 05/26/2020  . Years since quitting: 0.4  Smokeless Tobacco Never Used  Tobacco Comment   smokes 2-3 cigs per day     Goals Met:  Proper associated with RPD/PD & O2 Sat Exercise tolerated well Strength training completed today  Goals Unmet:  Not Applicable  Comments: Service time is from 1300 to 1410    Dr. Fransico Him is Medical Director for Cardiac Rehab at [facility].

## 2020-10-21 ENCOUNTER — Telehealth (HOSPITAL_COMMUNITY): Payer: Self-pay | Admitting: *Deleted

## 2020-10-21 ENCOUNTER — Encounter (HOSPITAL_COMMUNITY): Payer: Medicare Other

## 2020-10-21 ENCOUNTER — Telehealth (HOSPITAL_COMMUNITY): Payer: Self-pay | Admitting: Internal Medicine

## 2020-10-26 ENCOUNTER — Encounter: Payer: Self-pay | Admitting: Pulmonary Disease

## 2020-10-26 ENCOUNTER — Other Ambulatory Visit: Payer: Self-pay

## 2020-10-26 ENCOUNTER — Ambulatory Visit (INDEPENDENT_AMBULATORY_CARE_PROVIDER_SITE_OTHER): Payer: Medicare Other

## 2020-10-26 ENCOUNTER — Telehealth (HOSPITAL_COMMUNITY): Payer: Self-pay | Admitting: Internal Medicine

## 2020-10-26 ENCOUNTER — Encounter (HOSPITAL_COMMUNITY): Payer: Medicare Other

## 2020-10-26 ENCOUNTER — Telehealth: Payer: Self-pay | Admitting: Pulmonary Disease

## 2020-10-26 ENCOUNTER — Ambulatory Visit (INDEPENDENT_AMBULATORY_CARE_PROVIDER_SITE_OTHER): Payer: Medicare Other | Admitting: Pulmonary Disease

## 2020-10-26 VITALS — BP 142/70 | HR 102 | Temp 98.0°F | Ht 63.0 in | Wt 231.0 lb

## 2020-10-26 DIAGNOSIS — U099 Post covid-19 condition, unspecified: Secondary | ICD-10-CM | POA: Diagnosis not present

## 2020-10-26 DIAGNOSIS — R0602 Shortness of breath: Secondary | ICD-10-CM | POA: Diagnosis not present

## 2020-10-26 NOTE — Telephone Encounter (Signed)
Spoke with the pt  She states that she is needing [place] to check on order for POC  She states that [employer] told her they needed more information  PCC's please find out what is needed thanks

## 2020-10-26 NOTE — Patient Instructions (Addendum)
We will get a chest x-ray today Reduce prednisone to 7.5 mg a day for 1 week and then to 5 mg/day.  Hold at that dose until he can be seen in office again Continue rehab, supplemental oxygen  Follow-up in 1-2 months

## 2020-10-26 NOTE — Progress Notes (Signed)
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  No PE on CTA but imaging shows extensive bilateral infiltrates. 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 She has since resumed it after Education officer, museum noticed the discrepancy and she is now at 20 mg a day. 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 No mold, hot tub, Jacuzzi Smoking history: Never smoker Travel history: Originally from [place]. No significant recent travel Relevant family history: No significant family issue of lung disease.  Interim history: Continues on slow prednisone taper.  Now on 10 mg/day Had some mild worsening of breathing over the past week but now feels better  Complains of leg swelling, lower extremity edema while on the prednisone.  Continues on Lasix She has started pulmonary rehab  Outpatient Encounter Medications as of 10/26/2020  Medication Sig  . acetaminophen (TYLENOL) 325 MG tablet Take 2 tablets (650 mg total) by mouth every 6 (six) hours as needed for mild pain or headache (fever >/= 101).  Marland Kitchen albuterol (VENTOLIN HFA) 108 (90 Base) MCG/ACT inhaler Inhale 2 puffs into the lungs every 6 (six) hours as needed for wheezing or shortness of breath.  . clotrimazole (MYCELEX) 10 MG troche Take  1 tablet (10 mg total) by mouth 5 (five) times daily.  . fluticasone (FLONASE) 50 MCG/ACT nasal spray Place 2 sprays into both nostrils daily.  . furosemide (LASIX) 20 MG tablet Take 20 mg by mouth daily.  Marland Kitchen gabapentin (NEURONTIN) 300 MG capsule Take 300 mg by mouth 3 (three) times daily as needed (nerve pain).   Marland Kitchen ipratropium-albuterol (DUONEB) 0.5-2.5 (3) MG/3ML SOLN Take 3 mLs by nebulization every 6 (six) hours as needed.  . pantoprazole (PROTONIX) 40 MG tablet Take 1 tablet (40 mg total) by mouth daily at 6 (six) AM.  . predniSONE (DELTASONE) 10 MG tablet Take 1 tablet (10 mg total) by mouth daily with breakfast.  . predniSONE (DELTASONE) 10 MG tablet Take 1 tablet (10 mg total) by mouth daily with breakfast.  . zolpidem (AMBIEN) 10 MG tablet Take 10 mg by mouth at bedtime as needed for sleep.    No facility-administered encounter medications on file as of 10/26/2020.    Physical Exam: Blood pressure (!) 142/70, pulse (!) 102, temperature 98 F (36.7 C), temperature source Temporal, height 5' 3" (1.6 m), weight 231 lb (104.8 kg), SpO2 97 %. Gen:      No acute distress HEENT:  EOMI, sclera anicteric Neck:     No masses; no thyromegaly Lungs:    Clear to auscultation bilaterally; normal respiratory effort CV:         Regular rate and rhythm; no murmurs Abd:      + bowel sounds; soft, non-tender; no palpable masses, no distension Ext:    No edema; adequate peripheral perfusion Skin:  Warm and dry; no rash Neuro: alert and oriented x 3 Psych: normal mood and affect  Data Reviewed: Imaging: CTA 05/26/2020-no PE, diffuse bilateral interstitial infiltrates and groundglass opacities.  Overall improved compared to prior CT. Chest x-ray 07/13/2020 -persistent bilateral pulmonary infiltrates.  I have reviewed the images personally. High-res CT 10/01/2020-improvement in groundglass opacities with development of fibrotic changes consistent with evolving post-COVID ILD.  I have reviewed the  images personally.  PFTs: 08/31/2020 FVC 1.35 [50%], FEV1 1.28 [69%], F/F 95, TLC 1.99 [40%], DLCO 8.42 [44%] Severe restriction, diffusion defect  Labs: CBC 07/13/2020-WBC 10.8, eos 0.1%, absolute eosinophil count 11 CMP 07/13/2020-normal except for slight increase in bicarb and glucose  Cardiac: Echocardiogram 04/24/2020-LVEF 60 to 65%, moderate concentric LVH, RV systolic size and function are normal.  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  High-res CT reviewed with improvement in groundglass opacities with development of fibrotic changes consistent with evolving post-COVID ILD.   Continue very slow taper of prednisone I have instructed her to go to 7.5 mg for a week and then to 5 mg and hold at that dose until she can be reevaluated in the office She has stopped taking the Bactrim.  We will hold off on reordering as the prednisone dose is low  Get chest x-ray she is complaining of back pain Continue pulmonary rehab  Asthma She has history of asthma but no obstruction on PFTs.  She has not tolerated Symbicort due to thrush Continue duo nebs, albuterol as needed  HFpEF Echo reviewed with EF 60-65%, LVH Continue Lasix  OSA Intolerant of CPAP and is not interested in retrying  Plan/Recommendations: Reduce prednisone to 7.5 mg for a week and then to 5 mg.  Hold at that dose Chest x-ray Continue Lasix, continue pulmonary rehab  Reevaluate in 1 to 2 months  Marshell Garfinkel MD [facility] 10/26/2020, 9:09 AM  CC: Nolene Ebbs, MD

## 2020-10-27 NOTE — Telephone Encounter (Signed)
I will send message to Leah at [employer] to see what is needed.  Order was sent on 2/14 & confirmation was received from her on 2/15.

## 2020-10-27 NOTE — Telephone Encounter (Signed)
Received message back from [facility] that nothing is needed.  It was explained to pt that the [place] is private pay since she is 45 days past the original day of set up.  She was originally set up on 05/10/20 Due to guidelines she doesn't qualify for poc through insurance.  Reach out to [facility] with further questions.   I called Melissa.  She is going to put in order for pt to have [place] said someone would be in touch with pt for him to come in and see what they can offer.  I called pt & made her aware someone from [facility] will be contacting her.  Nothing further needed.

## 2020-10-28 ENCOUNTER — Encounter (HOSPITAL_COMMUNITY): Payer: Medicare Other

## 2020-10-28 ENCOUNTER — Telehealth (HOSPITAL_COMMUNITY): Payer: Self-pay | Admitting: Internal Medicine

## 2020-11-02 ENCOUNTER — Encounter (HOSPITAL_COMMUNITY)
Admission: RE | Admit: 2020-11-02 | Discharge: 2020-11-02 | Disposition: A | Discharge status: Home / Self Care | Payer: Medicare Other | Source: Ambulatory Visit | Attending: Pulmonary Disease | Admitting: Pulmonary Disease

## 2020-11-02 ENCOUNTER — Other Ambulatory Visit: Payer: Self-pay

## 2020-11-02 VITALS — Wt 234.3 lb

## 2020-11-02 DIAGNOSIS — U071 COVID-19: Secondary | ICD-10-CM

## 2020-11-02 NOTE — Progress Notes (Signed)
Daily Session Note  Patient Details  Name: Tiffany Coleman MRN: [id number] Date of Birth: 1955-11-11 Referring Provider:   April Manson Pulmonary Rehab Walk Test from 10/01/2020 in [place]  Referring Provider Dr. Vaughan Browner      Encounter Date: 11/02/2020  Check In:  Session Check In - 11/02/20 1428      Check-In   Supervising physician immediately available to respond to emergencies [facility] immediately available    Physician(s) Dr. Tawanna Solo    Location [facility]    Staff Present Rosebud Poles, RN, BSN;Lisa Ysidro Evert, RN;Jessica Hassell Done, MS, ACSM-CEP, Exercise Physiologist    Virtual Visit No    Medication changes reported     No    Fall or balance concerns reported    No    Tobacco Cessation No Change    Warm-up and Cool-down Performed on first and last piece of equipment    Resistance Training Performed Yes    VAD Patient? No    PAD/SET Patient? No      Pain Assessment   Currently in Pain? No/denies    Multiple Pain Sites No           Capillary Blood Glucose: No results found for this or any previous visit (from the past 24 hour(s)).   Exercise Prescription Changes - 11/02/20 1500      Response to Exercise   Blood Pressure (Admit) 112/76    Blood Pressure (Exercise) 128/70    Blood Pressure (Exit) 126/70    Heart Rate (Admit) 92 bpm    Heart Rate (Exercise) 111 bpm    Heart Rate (Exit) 92 bpm    Oxygen Saturation (Admit) 100 %    Oxygen Saturation (Exercise) 91 %    Oxygen Saturation (Exit) 100 %    Rating of Perceived Exertion (Exercise) 9    Perceived Dyspnea (Exercise) 1    Duration Progress to 45 minutes of aerobic exercise without signs/symptoms of physical distress    Intensity THRR unchanged      Progression   Progression Continue to progress workloads to maintain intensity without signs/symptoms of physical distress.      Resistance Training   Training Prescription Yes    Weight orange  bands    Reps 10-15    Time 10 Minutes      Oxygen   Oxygen Continuous    Liters 3      NuStep   Level 2    SPM 80    Minutes 30    METs 2.1           Social History   Tobacco Use  Smoking Status Former Smoker  . Years: 20.00  . Types: Cigarettes  . Quit date: 05/26/2020  . Years since quitting: 0.4  Smokeless Tobacco Never Used    Goals Met:  Proper associated with RPD/PD & O2 Sat Exercise tolerated well No report of cardiac concerns or symptoms Strength training completed today  Goals Unmet:  Not Applicable  Comments: Service time is from 1300 to 1414    Dr. Fransico Him is Medical Director for Cardiac Rehab at [facility].

## 2020-11-04 ENCOUNTER — Other Ambulatory Visit: Payer: Self-pay

## 2020-11-04 ENCOUNTER — Encounter (HOSPITAL_COMMUNITY)
Admission: RE | Admit: 2020-11-04 | Discharge: 2020-11-04 | Disposition: A | Discharge status: Home / Self Care | Payer: Medicare Other | Source: Ambulatory Visit | Attending: Pulmonary Disease | Admitting: Pulmonary Disease

## 2020-11-04 DIAGNOSIS — J9601 Acute respiratory failure with hypoxia: Secondary | ICD-10-CM

## 2020-11-04 DIAGNOSIS — U071 COVID-19: Secondary | ICD-10-CM

## 2020-11-04 NOTE — Progress Notes (Signed)
Daily Session Note  Patient Details  Name: Tiffany Coleman MRN: [id number] Date of Birth: 07-03-1956 Referring Provider:   April Manson Pulmonary Rehab Walk Test from 10/01/2020 in [place]  Referring Provider Dr. Vaughan Browner      Encounter Date: 11/04/2020  Check In:  Session Check In - 11/04/20 1420      Check-In   Supervising physician immediately available to respond to emergencies [facility] immediately available    Physician(s) Dr. Tawanna Solo    Location [facility]    Staff Present Rosebud Poles, RN, BSN;Celena Lanius Ysidro Evert, RN;Jessica Hassell Done, MS, ACSM-CEP, Exercise Physiologist    Virtual Visit No    Medication changes reported     No    Fall or balance concerns reported    No    Tobacco Cessation No Change    Warm-up and Cool-down Performed on first and last piece of equipment    Resistance Training Performed Yes    VAD Patient? No    PAD/SET Patient? No      Pain Assessment   Currently in Pain? No/denies    Multiple Pain Sites No           Capillary Blood Glucose: No results found for this or any previous visit (from the past 24 hour(s)).    Social History   Tobacco Use  Smoking Status Former Smoker  . Years: 20.00  . Types: Cigarettes  . Quit date: 05/26/2020  . Years since quitting: 0.4  Smokeless Tobacco Never Used    Goals Met:  Exercise tolerated well No report of cardiac concerns or symptoms Strength training completed today  Goals Unmet:  Not Applicable  Comments: Service time is from 1300 to 1403    Dr. Fransico Him is Medical Director for Cardiac Rehab at [facility].

## 2020-11-09 ENCOUNTER — Encounter (HOSPITAL_COMMUNITY)
Admission: RE | Admit: 2020-11-09 | Discharge: 2020-11-09 | Disposition: A | Discharge status: Home / Self Care | Payer: Medicare Other | Source: Ambulatory Visit | Attending: Pulmonary Disease | Admitting: Pulmonary Disease

## 2020-11-09 ENCOUNTER — Telehealth (HOSPITAL_COMMUNITY): Payer: Self-pay | Admitting: *Deleted

## 2020-11-09 NOTE — Progress Notes (Signed)
Pulmonary Individual Treatment Plan  Patient Details  Name: ZOHA SPRANGER MRN: [id number] Date of Birth: 1955/09/09 Referring Provider:   April Manson Pulmonary Rehab Walk Test from 10/01/2020 in [place]  Referring Provider Dr. Vaughan Browner      Initial Encounter Date:  Flowsheet Row Pulmonary Rehab Walk Test from 10/01/2020 in [place]  Date 10/01/20      Visit Diagnosis: Acute hypoxemic respiratory failure due to COVID-19 [facility])  Patient's Home Medications on Admission:   Current Outpatient Medications:  .  acetaminophen (TYLENOL) 325 MG tablet, Take 2 tablets (650 mg total) by mouth every 6 (six) hours as needed for mild pain or headache (fever >/= 101)., Disp: , Rfl:  .  albuterol (VENTOLIN HFA) 108 (90 Base) MCG/ACT inhaler, Inhale 2 puffs into the lungs every 6 (six) hours as needed for wheezing or shortness of breath., Disp: 1 each, Rfl: 2 .  clotrimazole (MYCELEX) 10 MG troche, Take 1 tablet (10 mg total) by mouth 5 (five) times daily., Disp: 35 Troche, Rfl: 0 .  fluticasone (FLONASE) 50 MCG/ACT nasal spray, Place 2 sprays into both nostrils daily., Disp: , Rfl: 2 .  furosemide (LASIX) 20 MG tablet, Take 20 mg by mouth daily., Disp: , Rfl:  .  gabapentin (NEURONTIN) 300 MG capsule, Take 300 mg by mouth 3 (three) times daily as needed (nerve pain). , Disp: , Rfl:  .  ipratropium-albuterol (DUONEB) 0.5-2.5 (3) MG/3ML SOLN, Take 3 mLs by nebulization every 6 (six) hours as needed., Disp: 360 mL, Rfl: 5 .  pantoprazole (PROTONIX) 40 MG tablet, Take 1 tablet (40 mg total) by mouth daily at 6 (six) AM., Disp: 30 tablet, Rfl: 2 .  predniSONE (DELTASONE) 10 MG tablet, Take 1 tablet (10 mg total) by mouth daily with breakfast., Disp: 20 tablet, Rfl: 0 .  predniSONE (DELTASONE) 10 MG tablet, Take 1 tablet (10 mg total) by mouth daily with breakfast., Disp: 30 tablet, Rfl: 1 .  zolpidem (AMBIEN) 10 MG tablet, Take 10 mg by  mouth at bedtime as needed for sleep. , Disp: , Rfl:   Past Medical History: Past Medical History:  Diagnosis Date  . Anemia yrs ago  . Arthritis    knees  . Bronchitis    hx  . Complication of anesthesia    trouble breathing when wake up needs breathing tx when wakes up  . Hx of colonic polyps 2015  . Obstructive sleep apnea 06/02/2016   No CPAP used  . Recurrent ventral hernia 09/08/2013  . Umbilical hernia     Tobacco Use: Social History   Tobacco Use  Smoking Status Former Smoker  . Years: 20.00  . Types: Cigarettes  . Quit date: 05/26/2020  . Years since quitting: 0.4  Smokeless Tobacco Never Used    Labs: Recent Review Flowsheet Data    Labs for ITP Cardiac and Pulmonary Rehab Latest Ref Rng & Units 01/19/2017 11/02/2017 04/25/2019 04/05/2020 04/25/2020   Trlycerides <150 mg/dL - - - 148 -   Hemoglobin A1c 4.8 - 5.6 % 5.5 5.9 5.5 - 6.7(H)   HCO3 20.0 - 28.0 mmol/L - - - 26.4 -   TCO2 0 - 100 mmol/L - - - - -   O2SAT % - - - 38.5 -      Capillary Blood Glucose: Lab Results  Component Value Date   GLUCAP 111 (H) 05/08/2020   GLUCAP 176 (H) 05/07/2020   GLUCAP 107 (H) 05/07/2020  GLUCAP 200 (H) 05/06/2020   GLUCAP 226 (H) 05/06/2020     Pulmonary Assessment Scores:  Pulmonary Assessment Scores    Row Name 10/01/20 1439         ADL UCSD   ADL Phase Entry     SOB Score total 54           CAT Score   CAT Score 30           mMRC Score   mMRC Score 4           UCSD: Self-administered rating of dyspnea associated with activities of daily living (ADLs) 6-point scale (0 = "not at all" to 5 = "maximal or unable to do because of breathlessness")  Scoring Scores range from 0 to 120.  Minimally important difference is 5 units  CAT: CAT can identify the health impairment of COPD patients and is better correlated with disease progression.  CAT has a scoring range of zero to 40. The CAT score is classified into four groups of low (less than 10), medium  (10 - 20), high (21-30) and very high (31-40) based on the impact level of disease on health status. A CAT score over 10 suggests significant symptoms.  A worsening CAT score could be explained by an exacerbation, poor medication adherence, poor inhaler technique, or progression of COPD or comorbid conditions.  CAT MCID is 2 points  mMRC: mMRC (Modified Medical Research Council) Dyspnea Scale is used to assess the degree of baseline functional disability in patients of respiratory disease due to dyspnea. No minimal important difference is established. A decrease in score of 1 point or greater is considered a positive change.   Pulmonary Function Assessment:  Pulmonary Function Assessment - 10/01/20 1053      Breath   Shortness of Breath Yes;Limiting activity;Fear of Shortness of Breath           Exercise Target Goals: Exercise Program Goal: Individual exercise prescription set using results from initial 6 min walk test and THRR while considering  patient's activity barriers and safety.   Exercise Prescription Goal: Initial exercise prescription builds to 30-45 minutes a day of aerobic activity, 2-3 days per week.  Home exercise guidelines will be given to patient during program as part of exercise prescription that the participant will acknowledge.  Activity Barriers & Risk Stratification:  Activity Barriers & Cardiac Risk Stratification - 10/01/20 1051      Activity Barriers & Cardiac Risk Stratification   Activity Barriers Deconditioning;Shortness of Breath;Assistive Device;Balance Concerns           6 Minute Walk:  6 Minute Walk    Row Name 10/01/20 1410         6 Minute Walk   Phase Initial     Distance 950 feet     Walk Time 6 minutes     # of Rest Breaks 0     MPH 1.79     METS 2.31     RPE 11     Perceived Dyspnea  2     VO2 Peak 8.09     Symptoms Yes (comment)     Comments chronic back pain and shortness of breath     Resting HR 81 bpm     Resting BP  130/84     Resting Oxygen Saturation  99 %     Exercise Oxygen Saturation  during 6 min walk 83 %     Max Ex. HR 127 bpm  Max Ex. BP 166/86     2 Minute Post BP 142/80           Interval HR   1 Minute HR 106     2 Minute HR 110     3 Minute HR 114     4 Minute HR 113     5 Minute HR 124     6 Minute HR 127     2 Minute Post HR 80     Interval Heart Rate? Yes           Interval Oxygen   Interval Oxygen? Yes     Baseline Oxygen Saturation % 99 %     1 Minute Oxygen Saturation % 88 %     1 Minute Liters of Oxygen 3 L     2 Minute Oxygen Saturation % 83 %     2 Minute Liters of Oxygen 4 L     3 Minute Oxygen Saturation % 91 %     3 Minute Liters of Oxygen 4 L     4 Minute Oxygen Saturation % 90 %     4 Minute Liters of Oxygen 4 L     5 Minute Oxygen Saturation % 87 %     5 Minute Liters of Oxygen 4 L     6 Minute Oxygen Saturation % 84 %     6 Minute Liters of Oxygen 4 L     2 Minute Post Oxygen Saturation % 100 %     2 Minute Post Liters of Oxygen 4 L            Oxygen Initial Assessment:  Oxygen Initial Assessment - 10/01/20 1052      Home Oxygen   Home Oxygen Device E-Tanks;Home Concentrator    Sleep Oxygen Prescription Continuous    Liters per minute 3    Home Exercise Oxygen Prescription Continuous    Liters per minute 3    Home Resting Oxygen Prescription Continuous    Liters per minute 3    Compliance with Home Oxygen Use Yes      Initial 6 min Walk   Oxygen Used Continuous    Liters per minute 4      Program Oxygen Prescription   Program Oxygen Prescription Continuous    Liters per minute 4    Comments Pt may need to be on 5 liters during exercise based off of 6 minute walk test. On 4L, oxygen saturation dropped to 84% in the last minute of walk test.      Intervention   Short Term Goals To learn and exhibit compliance with exercise, home and travel O2 prescription;To learn and understand importance of monitoring SPO2 with pulse oximeter and  demonstrate accurate use of the pulse oximeter.;To learn and understand importance of maintaining oxygen saturations>88%;To learn and demonstrate proper use of respiratory medications;To learn and demonstrate proper pursed lip breathing techniques or other breathing techniques.    Long  Term Goals Exhibits compliance with exercise, home and travel O2 prescription;Verbalizes importance of monitoring SPO2 with pulse oximeter and return demonstration;Maintenance of O2 saturations>88%;Exhibits proper breathing techniques, such as pursed lip breathing or other method taught during program session;Compliance with respiratory medication;Demonstrates proper use of MDI's           Oxygen Re-Evaluation:  Oxygen Re-Evaluation    Row Name 10/11/20 1425 11/09/20 1314           Program Oxygen Prescription   Program Oxygen Prescription Continuous  Continuous      Liters per minute 3 3      Comments Pt only requires 3L of oxygen for seated exercise. If she was to walk in the future she does need to be on 4L with walking. --             Home Oxygen   Home Oxygen Device E-Tanks;Home Concentrator E-Tanks;Home Concentrator      Sleep Oxygen Prescription Continuous Continuous      Liters per minute 3 3      Home Exercise Oxygen Prescription Continuous Continuous      Liters per minute 3 3      Home Resting Oxygen Prescription Continuous Continuous      Liters per minute 3 3      Compliance with Home Oxygen Use Yes Yes             Goals/Expected Outcomes   Short Term Goals To learn and exhibit compliance with exercise, home and travel O2 prescription;To learn and understand importance of monitoring SPO2 with pulse oximeter and demonstrate accurate use of the pulse oximeter.;To learn and understand importance of maintaining oxygen saturations>88%;To learn and demonstrate proper use of respiratory medications;To learn and demonstrate proper pursed lip breathing techniques or other breathing techniques. To  learn and exhibit compliance with exercise, home and travel O2 prescription;To learn and understand importance of monitoring SPO2 with pulse oximeter and demonstrate accurate use of the pulse oximeter.;To learn and understand importance of maintaining oxygen saturations>88%;To learn and demonstrate proper use of respiratory medications;To learn and demonstrate proper pursed lip breathing techniques or other breathing techniques.      Long  Term Goals Exhibits compliance with exercise, home and travel O2 prescription;Verbalizes importance of monitoring SPO2 with pulse oximeter and return demonstration;Maintenance of O2 saturations>88%;Exhibits proper breathing techniques, such as pursed lip breathing or other method taught during program session;Compliance with respiratory medication;Demonstrates proper use of MDI's Exhibits compliance with exercise, home and travel O2 prescription;Verbalizes importance of monitoring SPO2 with pulse oximeter and return demonstration;Maintenance of O2 saturations>88%;Exhibits proper breathing techniques, such as pursed lip breathing or other method taught during program session;Compliance with respiratory medication;Demonstrates proper use of MDI's      Comments Pt needs cues for pursed lip breathing. Pt also thinks when she is short of breath she needs to increase her liter flow of oxygen. Pt needs to understand that her shortness of breath will not be solved by increasing liter flow. Have discussed this with pt more than once. Pt is understanding oxygen needs and improtance of following MD's O2 prescription. Pt still needs cues for pursed lip breathing but is getting better with this.      Goals/Expected Outcomes compliance and understanding of oxygen saturation and pursed lip breathing. Understanding and compliance of prescribed liter flow with exertion. compliance and understanding of oxygen saturation and pursed lip breathing. Understanding and compliance of prescribed  liter flow with exertion.             Oxygen Discharge (Final Oxygen Re-Evaluation):  Oxygen Re-Evaluation - 11/09/20 1314      Program Oxygen Prescription   Program Oxygen Prescription Continuous    Liters per minute 3      Home Oxygen   Home Oxygen Device E-Tanks;Home Concentrator    Sleep Oxygen Prescription Continuous    Liters per minute 3    Home Exercise Oxygen Prescription Continuous    Liters per minute 3    Home Resting Oxygen Prescription Continuous  Liters per minute 3    Compliance with Home Oxygen Use Yes      Goals/Expected Outcomes   Short Term Goals To learn and exhibit compliance with exercise, home and travel O2 prescription;To learn and understand importance of monitoring SPO2 with pulse oximeter and demonstrate accurate use of the pulse oximeter.;To learn and understand importance of maintaining oxygen saturations>88%;To learn and demonstrate proper use of respiratory medications;To learn and demonstrate proper pursed lip breathing techniques or other breathing techniques.    Long  Term Goals Exhibits compliance with exercise, home and travel O2 prescription;Verbalizes importance of monitoring SPO2 with pulse oximeter and return demonstration;Maintenance of O2 saturations>88%;Exhibits proper breathing techniques, such as pursed lip breathing or other method taught during program session;Compliance with respiratory medication;Demonstrates proper use of MDI's    Comments Pt is understanding oxygen needs and improtance of following MD's O2 prescription. Pt still needs cues for pursed lip breathing but is getting better with this.    Goals/Expected Outcomes compliance and understanding of oxygen saturation and pursed lip breathing. Understanding and compliance of prescribed liter flow with exertion.           Initial Exercise Prescription:  Initial Exercise Prescription - 10/01/20 1400      Date of Initial Exercise RX and Referring Provider   Date 10/01/20     Referring Provider Dr. Vaughan Browner    Expected Discharge Date 11/25/20      Oxygen   Oxygen Continuous    Liters 4-5      NuStep   Level 1    SPM 80    Minutes 30      Prescription Details   Frequency (times per week) 2    Duration Progress to 45 minutes of aerobic exercise without signs/symptoms of physical distress      Intensity   THRR 40-80% of Max Heartrate 62-123    Ratings of Perceived Exertion 11-13    Perceived Dyspnea 0-4      Progression   Progression Continue to progress workloads to maintain intensity without signs/symptoms of physical distress.      Resistance Training   Training Prescription Yes    Weight orange bands    Reps 10-15           Perform Capillary Blood Glucose checks as needed.  Exercise Prescription Changes:  Exercise Prescription Changes    Row Name 10/05/20 1500 10/11/20 1300 10/19/20 1500 11/02/20 1500       Response to Exercise   Blood Pressure (Admit) 135/79 -- 136/80 112/76    Blood Pressure (Exercise) 130/80 -- 160/78 128/70    Blood Pressure (Exit) 117/75 -- 130/80 126/70    Heart Rate (Admit) 83 bpm -- 104 bpm 92 bpm    Heart Rate (Exercise) 105 bpm -- 109 bpm 111 bpm    Heart Rate (Exit) 84 bpm -- 86 bpm 92 bpm    Oxygen Saturation (Admit) 100 % -- 100 % 100 %    Oxygen Saturation (Exercise) 95 % -- 93 % 91 %    Oxygen Saturation (Exit) 100 % -- 100 % 100 %    Rating of Perceived Exertion (Exercise) 11 -- 10 9    Perceived Dyspnea (Exercise) 1 -- 1 1    Duration Continue with 30 min of aerobic exercise without signs/symptoms of physical distress. -- Progress to 45 minutes of aerobic exercise without signs/symptoms of physical distress Progress to 45 minutes of aerobic exercise without signs/symptoms of physical distress    Intensity THRR unchanged  40-80% HR max -- Other (comment)  40-80% HR max THRR unchanged         Progression   Progression Continue to progress workloads to maintain intensity without signs/symptoms of  physical distress. -- Continue to progress workloads to maintain intensity without signs/symptoms of physical distress. Continue to progress workloads to maintain intensity without signs/symptoms of physical distress.    Average METs -- 2 -- --         Horticulturist, commercial Prescription Yes -- Yes Yes    Weight orange bands -- orange bands orange bands    Reps 10-15 -- 10-15 10-15    Time 10 Minutes -- 10 Minutes 10 Minutes         Oxygen   Oxygen Continuous -- Continuous Continuous    Liters 3-4 -- 3 3         NuStep   Level 1 -- 2 2    SPM 80 -- 80 80    Minutes 30 -- 30 30    METs 2.1 -- 2.2 2.1           Exercise Comments:  Exercise Comments    Row Name 10/05/20 1456           Exercise Comments Patient completed first day of exercise and tolerated well for the most part. She is deconditioned and has chronic back pain which limits her range of motion and mobility. She was able to do 30 minutes on the Nustep and also did resistance training. Her only complaint was chronic back pain. Will continue to monitor and progress.              Exercise Goals and Review:  Exercise Goals    Row Name 10/01/20 1409             Exercise Goals   Increase Physical Activity Yes       Intervention Provide advice, education, support and counseling about physical activity/exercise needs.;Develop an individualized exercise prescription for aerobic and resistive training based on initial evaluation findings, risk stratification, comorbidities and participant's personal goals.       Expected Outcomes Short Term: Attend rehab on a regular basis to increase amount of physical activity.;Long Term: Add in home exercise to make exercise part of routine and to increase amount of physical activity.;Long Term: Exercising regularly at least 3-5 days a week.       Increase Strength and Stamina Yes       Intervention Provide advice, education, support and counseling about physical  activity/exercise needs.;Develop an individualized exercise prescription for aerobic and resistive training based on initial evaluation findings, risk stratification, comorbidities and participant's personal goals.       Expected Outcomes Short Term: Increase workloads from initial exercise prescription for resistance, speed, and METs.;Short Term: Perform resistance training exercises routinely during rehab and add in resistance training at home;Long Term: Improve cardiorespiratory fitness, muscular endurance and strength as measured by increased METs and functional capacity (6MWT)       Able to understand and use rate of perceived exertion (RPE) scale Yes       Intervention Provide education and explanation on how to use RPE scale       Expected Outcomes Short Term: Able to use RPE daily in rehab to express subjective intensity level;Long Term:  Able to use RPE to guide intensity level when exercising independently       Able to understand and use Dyspnea scale Yes  Intervention Provide education and explanation on how to use Dyspnea scale       Expected Outcomes Short Term: Able to use Dyspnea scale daily in rehab to express subjective sense of shortness of breath during exertion;Long Term: Able to use Dyspnea scale to guide intensity level when exercising independently       Knowledge and understanding of Target Heart Rate Range (THRR) Yes       Intervention Provide education and explanation of THRR including how the numbers were predicted and where they are located for reference       Expected Outcomes Long Term: Able to use THRR to govern intensity when exercising independently;Short Term: Able to state/look up THRR;Short Term: Able to use daily as guideline for intensity in rehab       Understanding of Exercise Prescription Yes       Intervention Provide education, explanation, and written materials on patient's individual exercise prescription       Expected Outcomes Short Term: Able to  explain program exercise prescription;Long Term: Able to explain home exercise prescription to exercise independently              Exercise Goals Re-Evaluation :  Exercise Goals Re-Evaluation    Row Name 10/11/20 1346 10/11/20 1420 11/09/20 1312         Exercise Goal Re-Evaluation   Exercise Goals Review Increase Physical Activity;Increase Strength and Stamina;Able to understand and use rate of perceived exertion (RPE) scale;Able to understand and use Dyspnea scale;Knowledge and understanding of Target Heart Rate Range (THRR);Understanding of Exercise Prescription (P)  Increase Physical Activity;Increase Strength and Stamina;Able to understand and use rate of perceived exertion (RPE) scale;Able to understand and use Dyspnea scale;Knowledge and understanding of Target Heart Rate Range (THRR);Understanding of Exercise Prescription Increase Physical Activity;Increase Strength and Stamina;Able to understand and use rate of perceived exertion (RPE) scale;Able to understand and use Dyspnea scale;Knowledge and understanding of Target Heart Rate Range (THRR);Understanding of Exercise Prescription     Comments -- Pt has completed 2 exercise sessions. She is very deconditioned and is dependent on staff to help her with equipment and exercises. It is too early to see any progression, but will continue to monitor and progress as she is able. She is currently exercising at 1.9 METS on the Nustep. She is able to do the resistance bands but has to do them seated due to balance concerns. Montasia has completed 6 exercise sessions and has been slow to make progressions due to deconditioning. She is still dependent on staff to help set-up equipment but she is becoming independent with resistance exercises and stretches. She is exercising at 2.0 METS on level 2 on the Nustep. Will continue to monitor and progress as she is able. Will also discuss home exercise prescription in the upcoming weeks.     Expected Outcomes --  Through exercise at rehab and home the patient will decrease shortness of breath with daily activities and feel confident in carrying out and exercise regimn at home. Through exercise at rehab and home the patient will decrease shortness of breath with daily activities and feel confident in carrying out and exercise regimn at home.            Discharge Exercise Prescription (Final Exercise Prescription Changes):  Exercise Prescription Changes - 11/02/20 1500      Response to Exercise   Blood Pressure (Admit) 112/76    Blood Pressure (Exercise) 128/70    Blood Pressure (Exit) 126/70    Heart Rate (Admit)  92 bpm    Heart Rate (Exercise) 111 bpm    Heart Rate (Exit) 92 bpm    Oxygen Saturation (Admit) 100 %    Oxygen Saturation (Exercise) 91 %    Oxygen Saturation (Exit) 100 %    Rating of Perceived Exertion (Exercise) 9    Perceived Dyspnea (Exercise) 1    Duration Progress to 45 minutes of aerobic exercise without signs/symptoms of physical distress    Intensity THRR unchanged      Progression   Progression Continue to progress workloads to maintain intensity without signs/symptoms of physical distress.      Resistance Training   Training Prescription Yes    Weight orange bands    Reps 10-15    Time 10 Minutes      Oxygen   Oxygen Continuous    Liters 3      NuStep   Level 2    SPM 80    Minutes 30    METs 2.1           Nutrition:  Target Goals: Understanding of nutrition guidelines, daily intake of sodium <1573m, cholesterol <2063m calories 30% from fat and 7% or less from saturated fats, daily to have 5 or more servings of fruits and vegetables.  Biometrics:  Pre Biometrics - 10/01/20 1409      Pre Biometrics   Grip Strength 25 kg            Nutrition Therapy Plan and Nutrition Goals:  Nutrition Therapy & Goals - 10/14/20 1447      Nutrition Therapy   Diet Low sodium      Personal Nutrition Goals   Nutrition Goal Pt to identify and limit food  sources of saturated fat, trans fat, refined carbohydrates and sodium    Personal Goal #2 Pt to identify food quantities necessary to achieve weight loss of 6-24 lb at graduation from pulmonary rehab      [place]ate and counsel regarding individualized specific dietary modifications aiming towards targeted core components such as weight, hypertension, lipid management, diabetes, heart failure and other comorbidities.;Nutrition handout(s) given to patient.    Expected Outcomes Short Term Goal: A plan has been developed with personal nutrition goals set during dietitian appointment.;Long Term Goal: Adherence to prescribed nutrition plan.           Nutrition Assessments:  MEDIFICTS Score Key:  ?70 Need to make dietary changes   40-70 Heart Healthy Diet  ? 40 Therapeutic Level Cholesterol Diet  Flowsheet Row PULMONARY REHAB OTHER RESPIRATORY [facility] Total Score on Ad[facility] 55     Picture Your Plate Scores:  <4<79nhealthy dietary pattern with much room for improvement.  41-50 Dietary pattern unlikely to meet recommendations for good health and room for improvement.  51-60 More healthful dietary pattern, with some room for improvement.   >60 Healthy dietary pattern, although there may be some specific behaviors that could be improved.    Nutrition Goals Re-Evaluation:  Nutrition Goals Re-Evaluation    [place] 10/14/20 1447 11/03/20 1522           Goals   Current Weight 234 lb (106.1 kg) 234 lb 5.6 oz (106.3 kg)      Nutrition Goal -- Pt to identify and limit food sources of saturated fat, trans fat, refined carbohydrates and sodium             Personal Goal #  2 Re-Evaluation   Personal Goal #2 -- Pt to identify food quantities necessary to achieve weight loss of 6-24 lb at graduation from pulmonary rehab             Nutrition Goals Discharge (Final  Nutrition Goals Re-Evaluation):  Nutrition Goals Re-Evaluation - 11/03/20 1522      Goals   Current Weight 234 lb 5.6 oz (106.3 kg)    Nutrition Goal Pt to identify and limit food sources of saturated fat, trans fat, refined carbohydrates and sodium      Personal Goal #2 Re-Evaluation   Personal Goal #2 Pt to identify food quantities necessary to achieve weight loss of 6-24 lb at graduation from pulmonary rehab           Psychosocial: Target Goals: Acknowledge presence or absence of significant depression and/or stress, maximize coping skills, provide positive support system. Participant is able to verbalize types and ability to use techniques and skills needed for reducing stress and depression.  Initial Review & Psychosocial Screening:  Initial Psych Review & Screening - 10/01/20 1054      Initial Review   Current issues with Current Sleep Concerns      Family Dynamics   Good Support System? No   Has 3 kids that live local but they have their own lives   Strains Illness and family care strain    Concerns No support system    Comments Has 3 kids that live close but they are busy with their own lives and they dont help much      Barriers   Psychosocial barriers to participate in program The patient should benefit from training in stress management and relaxation.      Screening Interventions   Interventions Encouraged to exercise    Expected Outcomes Long Term Goal: Stressors or current issues are controlled or eliminated.;Long Term goal: The participant improves quality of Life and PHQ9 Scores as seen by post scores and/or verbalization of changes           Quality of Life Scores:  Scores of 19 and below usually indicate a poorer quality of life in these areas.  A difference of  2-3 points is a clinically meaningful difference.  A difference of 2-3 points in the total score of the Quality of Life Index has been associated with significant improvement in overall quality of  life, self-image, physical symptoms, and general health in studies assessing change in quality of life.  PHQ-9: Recent Review Flowsheet Data    Depression screen [facility] 2/9 10/01/2020 11/02/2017 05/29/2017 03/06/2017 01/19/2017   Decreased Interest 0 0 0 0 0   Down, Depressed, Hopeless 2 0 0 0 1    PHQ - 2 Score 2 0 0 0 1   Altered sleeping 2 - - - -   Tired, decreased energy 3 - - - -   Change in appetite 0 - - - -   Feeling bad or failure about yourself  0 - - - -   Trouble concentrating 0 - - - -   Moving slowly or fidgety/restless 0 - - - -   Suicidal thoughts 0 - - - -   PHQ-9 Score 7 - - - -   Difficult doing work/chores Somewhat difficult - - - -     Interpretation of Total Score  Total Score Depression Severity:  1-4 = Minimal depression, 5-9 = Mild depression, 10-14 = Moderate depression, 15-19 = Moderately severe depression, 20-27 = Severe depression  Psychosocial Evaluation and Intervention:   Psychosocial Re-Evaluation:  Psychosocial Re-Evaluation    [place] Name 10/11/20 1316 11/08/20 1308           Psychosocial Re-Evaluation   Current issues with Current Sleep Concerns Current Sleep Concerns      Comments Kiyanna just started pulmonary rehab, has attended 2 exercise sessions, psychosocial status has not changed since orientation/walk test. No change in sleep concerns, has had difficulty sleeping for some time.      Expected Outcomes For Arnesia to handle her stress in healthy ways. For Zemirah to be able to handle her sleep concerns in healthy ways and learn to lead a lifestyle that promotes good sleep patterns.      Interventions Encouraged to attend Pulmonary Rehabilitation for the exercise;Relaxation education;Stress management education Stress management education;Relaxation education;Encouraged to attend Pulmonary Rehabilitation for the exercise      Continue Psychosocial Services  Follow up required by staff Follow up required by staff             Psychosocial  Discharge (Final Psychosocial Re-Evaluation):  Psychosocial Re-Evaluation - 11/08/20 1308      Psychosocial Re-Evaluation   Current issues with Current Sleep Concerns    Comments No change in sleep concerns, has had difficulty sleeping for some time.    Expected Outcomes For Kyren to be able to handle her sleep concerns in healthy ways and learn to lead a lifestyle that promotes good sleep patterns.    Interventions Stress management education;Relaxation education;Encouraged to attend Pulmonary Rehabilitation for the exercise    Continue Psychosocial Services  Follow up required by staff           Education: Education Goals: Education classes will be provided on a weekly basis, covering required topics. Participant will state understanding/return demonstration of topics presented.  Learning Barriers/Preferences:  Learning Barriers/Preferences - 10/01/20 1057      Learning Barriers/Preferences   Learning Barriers Inability to learn new things   Has a learning disability   Learning Preferences Skilled Demonstration;Individual Instruction           Education Topics: Risk Factor Reduction:  -Group instruction that is supported by a PowerPoint presentation. Instructor discusses the definition of a risk factor, different risk factors for pulmonary disease, and how the heart and lungs work together.     Nutrition for Pulmonary Patient:  -Group instruction provided by PowerPoint slides, verbal discussion, and written materials to support subject matter. The instructor gives an explanation and review of healthy diet recommendations, which includes a discussion on weight management, recommendations for fruit and vegetable consumption, as well as protein, fluid, caffeine, fiber, sodium, sugar, and alcohol. Tips for eating when patients are short of breath are discussed.   Pursed Lip Breathing:  -Group instruction that is supported by demonstration and informational handouts. Instructor  discusses the benefits of pursed lip and diaphragmatic breathing and detailed demonstration on how to preform both.     Oxygen Safety:  -Group instruction provided by PowerPoint, verbal discussion, and written material to support subject matter. There is an overview of "What is Oxygen" and "Why do we need it".  Instructor also reviews how to create a safe environment for oxygen use, the importance of using oxygen as prescribed, and the risks of noncompliance. There is a brief discussion on traveling with oxygen and resources the patient may utilize. Flowsheet Row PULMONARY REHAB OTHER RESPIRATORY from 10/14/2020 in [place]  Date 10/14/20  Educator Handout  Oxygen Equipment:  -Group instruction provided by [facility] Staff utilizing handouts, written materials, and equipment demonstrations.   Signs and Symptoms:  -Group instruction provided by written material and verbal discussion to support subject matter. Warning signs and symptoms of infection, stroke, and heart attack are reviewed and when to call the physician/911 reinforced. Tips for preventing the spread of infection discussed.   Advanced Directives:  -Group instruction provided by verbal instruction and written material to support subject matter. Instructor reviews Advanced Directive laws and proper instruction for filling out document.   Pulmonary Video:  -Group video education that reviews the importance of medication and oxygen compliance, exercise, good nutrition, pulmonary hygiene, and pursed lip and diaphragmatic breathing for the pulmonary patient.   Exercise for the Pulmonary Patient:  -Group instruction that is supported by a PowerPoint presentation. Instructor discusses benefits of exercise, core components of exercise, frequency, duration, and intensity of an exercise routine, importance of utilizing pulse oximetry during exercise, safety while exercising, and options of places to  exercise outside of rehab.     Pulmonary Medications:  -Verbally interactive group education provided by instructor with focus on inhaled medications and proper administration.   Anatomy and Physiology of the Respiratory System and Intimacy:  -Group instruction provided by PowerPoint, verbal discussion, and written material to support subject matter. Instructor reviews respiratory cycle and anatomical components of the respiratory system and their functions. Instructor also reviews differences in obstructive and restrictive respiratory diseases with examples of each. Intimacy, Sex, and Sexuality differences are reviewed with a discussion on how relationships can change when diagnosed with pulmonary disease. Common sexual concerns are reviewed.   MD DAY -A group question and answer session with a medical doctor that allows participants to ask questions that relate to their pulmonary disease state.   OTHER EDUCATION -Group or individual verbal, written, or video instructions that support the educational goals of the pulmonary rehab program. [place] from 10/14/2020 in [place]  Date 10/05/20  [facility] Plate]  Educator Ailene Ravel H/O  Instruction Review Code 1- Verbalizes Understanding      Holiday Eating Survival Tips:  -Group instruction provided by PowerPoint slides, verbal discussion, and written materials to support subject matter. The instructor gives patients tips, tricks, and techniques to help them not only survive but enjoy the holidays despite the onslaught of food that accompanies the holidays.   Knowledge Questionnaire Score:   Core Components/Risk Factors/Patient Goals at Admission:  Personal Goals and Risk Factors at Admission - 10/01/20 1418      Core Components/Risk Factors/Patient Goals on Admission    Weight Management Yes;Obesity    Intervention Weight Management: Develop a combined nutrition and  exercise program designed to reach desired caloric intake, while maintaining appropriate intake of nutrient and fiber, sodium and fats, and appropriate energy expenditure required for the weight goal.;Weight Management: Provide education and appropriate resources to help participant work on and attain dietary goals.;Weight Management/Obesity: Establish reasonable short term and long term weight goals.;Obesity: Provide education and appropriate resources to help participant work on and attain dietary goals.    Improve shortness of breath with ADL's Yes    Intervention Provide education, individualized exercise plan and daily activity instruction to help decrease symptoms of SOB with activities of daily living.    Expected Outcomes Short Term: Improve cardiorespiratory fitness to achieve a reduction of symptoms when performing ADLs;Long Term: Be able to perform more ADLs without symptoms or delay the onset of  symptoms           Core Components/Risk Factors/Patient Goals Review:   Goals and Risk Factor Review    Row Name 10/11/20 1319 11/08/20 1311 11/08/20 1312         Core Components/Risk Factors/Patient Goals Review   Personal Goals Review Develop more efficient breathing techniques such as purse lipped breathing and diaphragmatic breathing and practicing self-pacing with activity.;Increase knowledge of respiratory medications and ability to use respiratory devices properly.;Improve shortness of breath with ADL's Develop more efficient breathing techniques such as purse lipped breathing and diaphragmatic breathing and practicing self-pacing with activity.;Increase knowledge of respiratory medications and ability to use respiratory devices properly.;Improve shortness of breath with ADL's Develop more efficient breathing techniques such as purse lipped breathing and diaphragmatic breathing and practicing self-pacing with activity.;Increase knowledge of respiratory medications and ability to use  respiratory devices properly.;Improve shortness of breath with ADL's     Review Beryle is learning about living with supplemental oxygen which is quite a lifestyle change.  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 -- Haidyn is making slow progress, she has been absent 3 times for various reasons, she is deconditioned since having covid-19, she is using supplemental oxygen.     Expected Outcomes See admission goals. -- See admission goals.            Core Components/Risk Factors/Patient Goals at Discharge (Final Review):   Goals and Risk Factor Review - 11/08/20 1312      Core Components/Risk Factors/Patient Goals Review   Personal Goals Review Develop more efficient breathing techniques such as purse lipped breathing and diaphragmatic breathing and practicing self-pacing with activity.;Increase knowledge of respiratory medications and ability to use respiratory devices properly.;Improve shortness of breath with ADL's    Review Makaelah is making slow progress, she has been absent 3 times for various reasons, she is deconditioned since having covid-19, she is using supplemental oxygen.    Expected Outcomes See admission goals.           ITP Comments:   Comments:

## 2020-11-11 ENCOUNTER — Other Ambulatory Visit: Payer: Self-pay

## 2020-11-11 ENCOUNTER — Encounter (HOSPITAL_COMMUNITY)
Admission: RE | Admit: 2020-11-11 | Discharge: 2020-11-11 | Disposition: A | Discharge status: Home / Self Care | Payer: Medicare Other | Source: Ambulatory Visit | Attending: Pulmonary Disease | Admitting: Pulmonary Disease

## 2020-11-11 VITALS — Wt 234.3 lb

## 2020-11-11 DIAGNOSIS — U071 COVID-19: Secondary | ICD-10-CM | POA: Diagnosis not present

## 2020-11-11 DIAGNOSIS — J9601 Acute respiratory failure with hypoxia: Secondary | ICD-10-CM

## 2020-11-11 NOTE — Progress Notes (Signed)
I have reviewed a Home Exercise Prescription with Sibyl Parr . Tiffany Coleman is currently exercising at home. She states she occasionally does the resistance bands and walks at home. The patient was advised to walk and do resistance training 2-3 additional days a week for 30 minutes.  Raiford Noble and I discussed how to progress their exercise prescription.  The patient stated that their goals were to lose weight, no longer rely on supplemental oxygen, be able to walk longer, and be able to return to work. The patient stated that they understand the exercise prescription.  We reviewed exercise guidelines, target heart rate during exercise, RPE Scale, weather conditions, use of rescue inhaler, endpoints for exercise, warmup and cool down.  Patient is encouraged to come to me with any questions. I will continue to follow up with the patient to assist them with progression and safety.   Rick Duff MS, ACSM CEP

## 2020-11-11 NOTE — Progress Notes (Signed)
Daily Session Note  Patient Details  Name: Tiffany Coleman MRN: [id number] Date of Birth: Sep 28, 1955 Referring Provider:   April Manson Pulmonary Rehab Walk Test from 10/01/2020 in [place]  Referring Provider Dr. Vaughan Browner      Encounter Date: 11/11/2020  Check In:  Session Check In - 11/11/20 1417      Check-In   Supervising physician immediately available to respond to emergencies [facility] immediately available    Physician(s) Dr. Florencia Reasons    Location [facility]    Staff Present Rosebud Poles, RN, BSN;Annarose Ouellet Ysidro Evert, RN;Jessica Hassell Done, MS, ACSM-CEP, Exercise Physiologist    Virtual Visit No    Medication changes reported     No    Fall or balance concerns reported    No    Tobacco Cessation No Change    Warm-up and Cool-down Performed on first and last piece of equipment    Resistance Training Performed Yes    VAD Patient? No    PAD/SET Patient? No      Pain Assessment   Currently in Pain? No/denies    Multiple Pain Sites No           Capillary Blood Glucose: No results found for this or any previous visit (from the past 24 hour(s)).    Social History   Tobacco Use  Smoking Status Former Smoker  . Years: 20.00  . Types: Cigarettes  . Quit date: 05/26/2020  . Years since quitting: 0.4  Smokeless Tobacco Never Used    Goals Met:  Exercise tolerated well No report of cardiac concerns or symptoms Strength training completed today  Goals Unmet:  Not Applicable  Comments: Service time is from 1300 to 1400    Dr. Fransico Him is Medical Director for Cardiac Rehab at [facility].

## 2020-11-16 ENCOUNTER — Encounter (HOSPITAL_COMMUNITY): Payer: Medicare Other

## 2020-11-18 ENCOUNTER — Encounter (HOSPITAL_COMMUNITY): Payer: Medicare Other

## 2020-11-19 ENCOUNTER — Telehealth: Payer: Self-pay | Admitting: Adult Health

## 2020-11-19 NOTE — Telephone Encounter (Signed)
POC evaluation by DME company on November 16, 2020 showed patient did not qualify for POC.  She decided on pulsed oxygen up to 5 L and was unable to maintain O2 saturations greater than 88 to 90%.  She will require continuous oxygen therapy.

## 2020-11-22 NOTE — Telephone Encounter (Signed)
Called and spoke with patient, she states she already went for the walk test and failed.  She requires more than 5L of oxygen.  She will be going to [place] in the near future and would like assistance with [employer] to have oxygen set up when she gets ready to travel, she will also need a note to fly with oxygen.  I advised to let [place] know when she will be traveling so we can contact [employer] to assist with making the arrangements with getting oxygen set up at her destination and getting her a note to fly with oxygen.  She verbalized understanding.  Nothing further needed at this time.

## 2020-11-23 ENCOUNTER — Encounter (HOSPITAL_COMMUNITY)
Admission: RE | Admit: 2020-11-23 | Discharge: 2020-11-23 | Disposition: A | Discharge status: Home / Self Care | Payer: Medicare Other | Source: Ambulatory Visit | Attending: Pulmonary Disease | Admitting: Pulmonary Disease

## 2020-11-23 ENCOUNTER — Other Ambulatory Visit: Payer: Self-pay

## 2020-11-23 DIAGNOSIS — U071 COVID-19: Secondary | ICD-10-CM | POA: Diagnosis present

## 2020-11-23 DIAGNOSIS — J9601 Acute respiratory failure with hypoxia: Secondary | ICD-10-CM | POA: Diagnosis present

## 2020-11-23 NOTE — Progress Notes (Signed)
Daily Session Note  Patient Details  Name: Tiffany Coleman MRN: [id number] Date of Birth: 12/24/1955 Referring Provider:   April Manson Pulmonary Rehab Walk Test from 10/01/2020 in [place]  Referring Provider Dr. Vaughan Browner      Encounter Date: 11/23/2020  Check In:  Session Check In - 11/23/20 1427      Check-In   Supervising physician immediately available to respond to emergencies [facility] immediately available    Physician(s) Dr. Tawanna Solo    Location [facility]    Staff Present Rosebud Poles, RN, Isaac Laud, MS, ACSM-CEP, Exercise Physiologist;David Makemson, MS, EP-C, CCRP    Virtual Visit No    Medication changes reported     No    Fall or balance concerns reported    No    Tobacco Cessation No Change    Warm-up and Cool-down Performed on first and last piece of equipment    Resistance Training Performed Yes    VAD Patient? No    PAD/SET Patient? No      Pain Assessment   Currently in Pain? No/denies    Multiple Pain Sites No           Capillary Blood Glucose: No results found for this or any previous visit (from the past 24 hour(s)).    Social History   Tobacco Use  Smoking Status Former Smoker  . Years: 20.00  . Types: Cigarettes  . Quit date: 05/26/2020  . Years since quitting: 0.4  Smokeless Tobacco Never Used    Goals Met:  Proper associated with RPD/PD & O2 Sat Exercise tolerated well No report of cardiac concerns or symptoms Completed Pulmonary Rehab program  Goals Unmet:  Not Applicable  Comments: Service time is from 1300 to 1335    Dr. Fransico Him is Medical Director for Cardiac Rehab at [facility]

## 2020-11-25 ENCOUNTER — Encounter (HOSPITAL_COMMUNITY): Payer: Medicare Other

## 2020-11-29 ENCOUNTER — Ambulatory Visit: Payer: Medicare Other | Admitting: Pulmonary Disease

## 2020-11-30 ENCOUNTER — Encounter (HOSPITAL_COMMUNITY): Payer: Medicare Other

## 2020-12-01 ENCOUNTER — Ambulatory Visit (INDEPENDENT_AMBULATORY_CARE_PROVIDER_SITE_OTHER): Payer: Medicare Other | Admitting: Pulmonary Disease

## 2020-12-01 ENCOUNTER — Encounter: Payer: Self-pay | Admitting: Pulmonary Disease

## 2020-12-01 ENCOUNTER — Other Ambulatory Visit: Payer: Self-pay

## 2020-12-01 DIAGNOSIS — U099 Post covid-19 condition, unspecified: Secondary | ICD-10-CM

## 2020-12-01 MED ORDER — PREDNISONE 5 MG PO TABS: ORAL_TABLET | ORAL | 2 refills | Status: DC

## 2020-12-01 NOTE — Patient Instructions (Signed)
Continue on the prednisone.  We can reattempt another taper to 7.5 mg We will place an order for new concentrator to refill portable tanks  Follow-up in 2 to 3 months with televisit

## 2020-12-01 NOTE — Progress Notes (Signed)
Tiffany Coleman    [id number]    November 06, 1955  Primary Care Physician:Avbuere, Christean Grief, MD  Referring Physician: Nolene Ebbs, MD [address] [place],  [place] [address]  Virtual Visit via Telephone Note  I connected with Tiffany Coleman on 12/01/20 at 11:30 AM EDT by telephone and verified that I am speaking with the correct person using two identifiers.  Location: Patient: Home Provider: [facility], [place], [place]   I discussed the limitations, risks, security and privacy concerns of performing an evaluation and management service by telephone and the availability of in person appointments. I also discussed with the patient that there may be a patient responsible charge related to this service. The patient expressed understanding and agreed to proceed.  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  No PE on CTA but imaging shows extensive bilateral infiltrates. 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 She has since resumed it after Education officer, museum noticed the discrepancy and she is now at 20 mg a day. 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 No mold, hot tub, Jacuzzi Smoking history: Never smoker Travel history: Originally from [place]. No significant recent travel Relevant family history: No significant family issue of lung disease.  Interim history: Continues on prednisone at 10 mg We attempted to go  down to 7.5 mg last month but her dyspnea got worse and she is back up to 10  Overall she is feeling better and able to take deeper breaths Continues on Lasix for lower extremity edema while on prednisone She attended pulmonary rehab but had desats so therapy was deferred   Outpatient Encounter Medications as of 12/01/2020  Medication Sig  . albuterol (VENTOLIN HFA) 108 (90 Base) MCG/ACT inhaler Inhale 2 puffs into the lungs every 6 (six) hours as needed for wheezing or shortness of breath.  . clotrimazole (MYCELEX) 10 MG troche Take 1 tablet (10 mg total) by mouth 5 (five) times daily.  . fluticasone (FLONASE) 50 MCG/ACT nasal spray Place 2 sprays into both nostrils daily.  . furosemide (LASIX) 20 MG tablet Take 20 mg by mouth daily.  Marland Kitchen gabapentin (NEURONTIN) 300 MG capsule Take 300 mg by mouth 3 (three) times daily as needed (nerve pain).   Marland Kitchen ipratropium-albuterol (DUONEB) 0.5-2.5 (3) MG/3ML SOLN Take 3 mLs by nebulization every 6 (six) hours as needed.  . pantoprazole (PROTONIX) 40 MG tablet Take 1 tablet (40 mg total) by mouth daily at 6 (six) AM.  . predniSONE (DELTASONE) 10 MG tablet Take 1 tablet (10 mg total) by mouth daily with breakfast.  . zolpidem (AMBIEN) 10 MG tablet Take 10 mg by mouth at bedtime as needed for sleep.   Marland Kitchen acetaminophen (TYLENOL) 325 MG tablet Take 2 tablets (650 mg total) by mouth every 6 (six) hours as needed for mild pain or headache (fever >/= 101). (Patient not taking: Reported on 12/01/2020)  . [DISCONTINUED] predniSONE (DELTASONE) 10 MG tablet Take 1 tablet (10 mg total)  by mouth daily with breakfast.   No facility-administered encounter medications on file as of 12/01/2020.    Physical Exam: Televisit  Data Reviewed: Imaging: CTA 05/26/2020-no PE, diffuse bilateral interstitial infiltrates and groundglass opacities.  Overall improved compared to prior CT. Chest x-ray 07/13/2020 -persistent bilateral pulmonary infiltrates.  I have reviewed the images  personally. High-res CT 10/01/2020-improvement in groundglass opacities with development of fibrotic changes consistent with evolving post-COVID ILD.  I have reviewed the images personally.  PFTs: 08/31/2020 FVC 1.35 [50%], FEV1 1.28 [69%], F/F 95, TLC 1.99 [40%], DLCO 8.42 [44%] Severe restriction, diffusion defect  Labs: CBC 07/13/2020-WBC 10.8, eos 0.1%, absolute eosinophil count 11 CMP 07/13/2020-normal except for slight increase in bicarb and glucose  Cardiac: Echocardiogram 04/24/2020-LVEF 60 to 65%, moderate concentric LVH, RV systolic size and function are normal.  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  High-res CT reviewed with improvement in groundglass opacities with development of fibrotic changes consistent with evolving post-COVID ILD.   Continue very slow taper of prednisone Currently on prednisone of 10 mg.  Will reattempt a slow taper to 7.5 mg  Pulmonary rehab on hold due to desats.  We can send a referral in a few months when she is better  Continue supplemental oxygen.  She will require a concentrator with a refiller to be able to replenish her portable tanks  Asthma She has history of asthma but no obstruction on PFTs.  She has not tolerated Symbicort due to thrush Continue duo nebs, albuterol as needed  HFpEF Echo reviewed with EF 60-65%, LVH Continue Lasix  OSA Intolerant of CPAP and is not interested in retrying  Plan/Recommendations: Attempted prednisone taper again Continue Lasix  Reevaluate in 1 to 2 months  I discussed the assessment and treatment plan with the patient. The patient was provided an opportunity to ask questions and all were answered. The patient agreed with the plan and demonstrated an understanding of the instructions.   The patient was advised to call back or seek an in-person evaluation if the symptoms worsen or if the  condition fails to improve as anticipated.  I provided 25 minutes of non-face-to-face time during this encounter.  Marshell Garfinkel MD [facility] 12/01/2020, 11:51 AM  CC: Nolene Ebbs, MD

## 2020-12-02 ENCOUNTER — Encounter (HOSPITAL_COMMUNITY): Payer: Medicare Other

## 2020-12-14 ENCOUNTER — Encounter (HOSPITAL_COMMUNITY): Payer: Self-pay | Admitting: Emergency Medicine

## 2020-12-14 ENCOUNTER — Other Ambulatory Visit: Payer: Self-pay

## 2020-12-14 ENCOUNTER — Other Ambulatory Visit: Payer: Self-pay | Admitting: Pulmonary Disease

## 2020-12-14 ENCOUNTER — Ambulatory Visit (HOSPITAL_COMMUNITY)
Admission: EM | Admit: 2020-12-14 | Discharge: 2020-12-14 | Disposition: A | Discharge status: Home / Self Care | Payer: Medicare Other | Attending: Student | Admitting: Student

## 2020-12-14 ENCOUNTER — Ambulatory Visit (INDEPENDENT_AMBULATORY_CARE_PROVIDER_SITE_OTHER): Payer: Medicare Other

## 2020-12-14 DIAGNOSIS — J4541 Moderate persistent asthma with (acute) exacerbation: Secondary | ICD-10-CM | POA: Diagnosis not present

## 2020-12-14 DIAGNOSIS — I509 Heart failure, unspecified: Secondary | ICD-10-CM

## 2020-12-14 DIAGNOSIS — R0602 Shortness of breath: Secondary | ICD-10-CM

## 2020-12-14 DIAGNOSIS — Z789 Other specified health status: Secondary | ICD-10-CM

## 2020-12-14 DIAGNOSIS — J301 Allergic rhinitis due to pollen: Secondary | ICD-10-CM | POA: Diagnosis not present

## 2020-12-14 DIAGNOSIS — J9610 Chronic respiratory failure, unspecified whether with hypoxia or hypercapnia: Secondary | ICD-10-CM

## 2020-12-14 DIAGNOSIS — Z8616 Personal history of COVID-19: Secondary | ICD-10-CM

## 2020-12-14 HISTORY — DX: Heart failure, unspecified: I50.9

## 2020-12-14 MED ORDER — IPRATROPIUM-ALBUTEROL 0.5-2.5 (3) MG/3ML IN SOLN: 3.0000 mL | Freq: Four times a day (QID) | RESPIRATORY_TRACT | 5 refills | Status: AC | PRN

## 2020-12-14 NOTE — ED Triage Notes (Signed)
Pt presents today with c/o of SOB and right upper "lung" pain x 1 week. She is on continuous Oxygen at 2L via [place]. She reports getting Covid in 03/2020.

## 2020-12-14 NOTE — Telephone Encounter (Signed)
With new persistent CP, she needs to be seen by [place] today - prob w CXR, d-dimer, etc - or go to urgent care / ED. Please advise her to set up. Thanks.

## 2020-12-14 NOTE — Telephone Encounter (Signed)
Pt called the office to let us know that she was currently at [facility]. I stated to her that they will take care of her there and told her if they do not send her to the ED from Urgent Care to call the office about getting an appt scheduled with provider to further discuss and she verbalized understanding.Stated to her since she did go to [facility] that we were able to see all that would be happening during that visit. Nothing further needed.

## 2020-12-14 NOTE — Telephone Encounter (Signed)
Pt stated that she needs a refill for nebulizer solution and Bactrim. Pharmacy; [facility]- [place], also pt stated that she is having pain in upper lung on the right side and causing her to have some difficulty breathing she feels sluggish, ongoing for a wk states that it hurts even more when she moves around feels like she has a cramp on that side. [place] regard [phone]

## 2020-12-14 NOTE — Discharge Instructions (Addendum)
-  For your lungs, increase the prednisone to 20 mg daily.  Also make sure to follow-up with your lung doctor for recheck. -You can increase your home oxygen to 3 L if necessary. -Continue albuterol breathing treatments and/or DuoNeb inhaler. -Take your allergy medication daily. -Continue your Lasix as directed. -Seek additional medical attention if your symptoms worsen or persist, like new fever/chills, worsening shortness of breath despite treatment, chest pain, dizziness, etc.

## 2020-12-14 NOTE — ED Provider Notes (Signed)
[facility]    CSN: [id number] Arrival date & time: 12/14/20  1403      History   Chief Complaint Chief Complaint  Patient presents with  . Shortness of Breath  . Chest Pain  . right upper shoulder pain    HPI RAIANNA SLIGHT is a 65 y.o. female presenting with 1 week of shortness of breath and pleuritic right-sided pain for about 1 week.  States she has been dealing with allergies, including sneezing and nasal congestion.  Has tried Claritin occasionally for this with minimal improvement. 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 She is followed by pulmonology for this. Also with diagnosis of OSA on CPAP and heart failure.  Currently taking prednisone 7.54m daily.  Denies chest pain, dizziness, fever/chills, nausea/vomiting/diarrhea. Denies fevers/chills, n/v/d, chest pain,  facial pain, teeth pain, headaches, sore throat, loss of taste/smell, swollen lymph nodes, ear pain.    HPI  Past Medical History:  Diagnosis Date  . Anemia yrs ago  . Arthritis    knees  . Bronchitis    hx  . CHF (congestive heart failure) ([place]   . Complication of anesthesia    trouble breathing when wake up needs breathing tx when wakes up  . Hx of colonic polyps 2015  . Obstructive sleep apnea 06/02/2016   No CPAP used  . Recurrent ventral hernia 09/08/2013  . Umbilical hernia     Patient Active Problem List   Diagnosis Date Noted  . Oral candidiasis 09/06/2020  . Chronic respiratory failure with hypoxia ([facility] 07/13/2020  . Edema 07/13/2020  . Pneumonitis: Post Covid pneumonitis 05/28/2020  . Acute respiratory failure with hypoxemia ([facility] 05/27/2020  . Anemia 05/27/2020  . Asthma 05/27/2020  . History of COVID-19   . Acute respiratory failure with hypoxia ([facility] 05/26/2020  . Acute hypoxemic respiratory failure due to COVID-19 ([facility] 04/05/2020  . Elevated BP without diagnosis of hypertension 04/25/2019  . 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 Teeth missing 05/30/2017  . GERD (gastroesophageal reflux disease) 05/30/2017  . Hypertension 03/08/2017  . Epigastric pain 03/06/2017  . Seasonal allergies 01/19/2017  . Morbid obesity ([facility] 01/19/2017  . Generalized anxiety disorder 10/05/2016  . History of colonic polyps 06/03/2016  . Obstructive sleep apnea 06/02/2016  . Recurrent ventral hernia 09/08/2013    Past Surgical History:  Procedure Laterality Date  . ABDOMINAL HYSTERECTOMY     partial  . BREAST SURGERY Bilateral    cyst removed  . COLON SURGERY    . COLONOSCOPY WITH PROPOFOL N/A 05/29/2017   Procedure: COLONOSCOPY WITH PROPOFOL;  Surgeon: SWilford Corner MD;  Location: [facility];  Service: Endoscopy;  Laterality: N/A;  . FINGER SURGERY     right pinkie x 3  . HERNIA REPAIR    . KNEE SURGERY Right 2017  . PARTIAL COLECTOMY Left ~2004   for obstruction  . right foot surgery  yrs ago  . stomach tumur     [id number] benign    OB History    Gravida  7   Para      Term      Preterm      AB  3   Living  3     SAB  2   IAB  1   Ectopic      Multiple      Live Births  4            Home Medications  Prior to Admission medications   Medication Sig Start Date End Date Taking? Authorizing Provider  furosemide (LASIX) 20 MG tablet Take 20 mg by mouth daily.   Yes [provider]  acetaminophen (TYLENOL) 325 MG tablet Take 2 tablets (650 mg total) by mouth every 6 (six) hours as needed for mild pain or headache (fever >/= 101). Patient not taking: Reported on 12/01/2020 05/11/20   Arrien, Jimmy Picket, MD  albuterol (VENTOLIN HFA) 108 (90 Base) MCG/ACT inhaler Inhale 2 puffs into the lungs every 6 (six) hours as needed for wheezing or shortness of breath. 09/06/20   Parrett, Fonnie Mu, NP  clotrimazole (MYCELEX) 10 MG troche Take 1 tablet (10 mg total) by mouth 5 (five) times daily. 09/06/20   Parrett, Fonnie Mu, NP  fluticasone (FLONASE) 50 MCG/ACT nasal  spray Place 2 sprays into both nostrils daily. 06/01/20   Eugenie Filler, MD  gabapentin (NEURONTIN) 300 MG capsule Take 300 mg by mouth 3 (three) times daily as needed (nerve pain).  07/17/19   [provider]  ipratropium-albuterol (DUONEB) 0.5-2.5 (3) MG/3ML SOLN Take 3 mLs by nebulization every 6 (six) hours as needed. 12/14/20   [name], Hart Robinsons, MD  pantoprazole (PROTONIX) 40 MG tablet Take 1 tablet (40 mg total) by mouth daily at 6 (six) AM. 06/01/20   Eugenie Filler, MD  predniSONE (DELTASONE) 10 MG tablet Take 1 tablet (10 mg total) by mouth daily with breakfast. 09/27/20   Parrett, Fonnie Mu, NP  predniSONE (DELTASONE) 5 MG tablet Take 7.16m daily 12/01/20   [name], Praveen, MD  zolpidem (AMBIEN) 10 MG tablet Take 10 mg by mouth at bedtime as needed for sleep.  07/18/19   [provider]    Family History Family History  Problem Relation Age of Onset  . Deep vein thrombosis Mother   . Diabetes Mother   . Hypertension Mother   . Varicose Veins Mother   . Cancer Father        colon  . Hypertension Sister   . Hyperlipidemia Brother   . Hypertension Daughter   . Heart attack Son   . Cancer Maternal Uncle     Social History Social History   Tobacco Use  . Smoking status: Former Smoker    Years: 20.00    Types: Cigarettes    Quit date: 05/26/2020    Years since quitting: 0.5  . Smokeless tobacco: Never Used  Vaping Use  . Vaping Use: Never used  Substance Use Topics  . Alcohol use: Yes    Comment: Occ  . Drug use: No     Allergies   Latex, Penicillins, Tape, Bactrim [sulfamethoxazole-trimethoprim], and Codeine   Review of Systems Review of Systems  Constitutional: Negative for appetite change, chills and fever.  HENT: Positive for congestion. Negative for ear pain, rhinorrhea, sinus pressure, sinus pain and sore throat.   Eyes: Negative for redness and visual disturbance.  Respiratory: Positive for shortness of breath and wheezing. Negative  for cough and chest tightness.   Cardiovascular: Negative for chest pain and palpitations.  Gastrointestinal: Negative for abdominal pain, constipation, diarrhea, nausea and vomiting.  Genitourinary: Negative for dysuria, frequency and urgency.  Musculoskeletal: Negative for myalgias.  Neurological: Negative for dizziness, weakness and headaches.  Psychiatric/Behavioral: Negative for confusion.  All other systems reviewed and are negative.    Physical Exam Triage Vital Signs ED Triage Vitals  Enc Vitals Group     BP      Pulse  Resp      Temp      Temp src      SpO2      Weight      Height      Head Circumference      Peak Flow      Pain Score      Pain Loc      Pain Edu?      Excl. in [place]?    No data found.  Updated Vital Signs BP 133/70 (BP Location: Right Arm)   Pulse 83   Temp 98 F (36.7 C) (Oral)   Resp (!) 24   SpO2 100%   Visual Acuity Right Eye Distance:   Left Eye Distance:   Bilateral Distance:    Right Eye Near:   Left Eye Near:    Bilateral Near:     Physical Exam Vitals reviewed.  Constitutional:      General: She is not in acute distress.    Appearance: Normal appearance. She is not ill-appearing or diaphoretic.  HENT:     Head: Normocephalic and atraumatic.     Right Ear: Hearing, tympanic membrane, ear canal and external ear normal. No swelling or tenderness. There is no impacted cerumen. No mastoid tenderness. Tympanic membrane is not perforated, erythematous, retracted or bulging.     Left Ear: Hearing, tympanic membrane, ear canal and external ear normal. No swelling or tenderness. There is no impacted cerumen. No mastoid tenderness. Tympanic membrane is not perforated, erythematous, retracted or bulging.     Nose:     Right Sinus: No maxillary sinus tenderness or frontal sinus tenderness.     Left Sinus: No maxillary sinus tenderness or frontal sinus tenderness.     Mouth/Throat:     Mouth: Mucous membranes are moist.     Pharynx:  Uvula midline. No oropharyngeal exudate or posterior oropharyngeal erythema.     Tonsils: No tonsillar exudate.  Eyes:     Extraocular Movements: Extraocular movements intact.     Pupils: Pupils are equal, round, and reactive to light.  Cardiovascular:     Rate and Rhythm: Normal rate and regular rhythm.     Pulses:          Radial pulses are 2+ on the right side and 2+ on the left side.     Heart sounds: Normal heart sounds.  Pulmonary:     Effort: Pulmonary effort is normal.     Breath sounds: Normal air entry. Wheezing present. No decreased breath sounds, rhonchi or rales.     Comments: Few scattered wheezes Chest:     Chest wall: No tenderness.  Abdominal:     General: Abdomen is flat. Bowel sounds are normal.     Palpations: Abdomen is soft.     Tenderness: There is no abdominal tenderness. There is no guarding or rebound.  Musculoskeletal:     Right lower leg: No edema.     Left lower leg: No edema.  Lymphadenopathy:     Cervical: No cervical adenopathy.  Skin:    General: Skin is warm.     Capillary Refill: Capillary refill takes less than 2 seconds.  Neurological:     General: No focal deficit present.     Mental Status: She is alert and oriented to person, place, and time.  Psychiatric:        Attention and Perception: Attention and perception normal.        Mood and Affect: Mood and affect normal.  Behavior: Behavior normal. Behavior is cooperative.        Thought Content: Thought content normal.        Judgment: Judgment normal.      UC Treatments / Results  Labs (all labs ordered are listed, but only abnormal results are displayed) Labs Reviewed - No data to display  EKG   Radiology DG Chest 2 View  Result Date: 12/14/2020 CLINICAL DATA:  Shortness of breath, chronic respiratory failure following COVID-19 in August 2021, history CHF EXAM: CHEST - 2 VIEW COMPARISON:  10/26/2020 FINDINGS: Enlargement of cardiac silhouette. Mediastinal contours and  pulmonary vascularity normal. Chronic parenchymal opacities throughout both lungs, slight peripheral predominance, unchanged. No new infiltrate, pleural effusion or pneumothorax. Scattered endplate spur formation thoracic spine. IMPRESSION: Chronic predominantly peripheral infiltrates in both lungs likely representing sequela of COVID-19 infection, unchanged. Enlargement of cardiac silhouette. No new abnormalities. Electronically Signed   By: Lavonia Dana M.D.   On: 12/14/2020 15:45    Procedures Procedures (including critical care time)  Medications Ordered in UC Medications - No data to display  Initial Impression / Assessment and Plan / UC Course  I have reviewed the triage vital signs and the nursing notes.  Pertinent labs & imaging results that were available during my care of the patient were reviewed by me and considered in my medical decision making (see chart for details).     This patient is a 65 year old female presenting with acute exacerbation of her chronic respiratory failure and asthma.  Suspect this is related to untreated allergic rhinitis.  Today she is afebrile, nontachycardic, but mildly tachypneic.  Oxygenating at 100% on 2 L of oxygen by nasal cannula, which is her baseline.  EKG NSR, unchanged from 07/2020 EKG. CXR- Chronic predominantly peripheral infiltrates in both lungs likely representing sequela of COVID-19 infection, unchanged. Enlargement of cardiac silhouette. No new abnormalities.  She is chronically on 7.5 mg of prednisone, will increase this to 20 mg for 7 days. She already has script of this at home.  Continue albuterol breathing treatments and duoneb inhalers. Continue 2L oxygen via nasal cannula. Can increase this to 3L if necessary.  Start daily Claritin for control of allergic rhinitis. Continue Lasix for heart failure. Recommended contacting her pulmonologist tomorrow for follow-up.  Seek immediate medical attention if symptoms worsen or  persist.  Coding this visit as a Level 4 for 2 chronic illnesses (respiratory failure and asthma) with acute exacerbation and systemic symptoms. Also with review of pulmonology notes; and I ordered xray and reviewed xray results.  Final Clinical Impressions(s) / UC Diagnoses   Final diagnoses:  Moderate persistent asthma with acute exacerbation  Seasonal allergic rhinitis due to pollen  Chronic respiratory failure, unspecified whether with hypoxia or hypercapnia (HCC)  History of COVID-19  Chronic congestive heart failure, unspecified heart failure type ([place])  On supplemental oxygen by nasal cannula     Discharge Instructions     -For your lungs, increase the prednisone to 20 mg daily.  Also make sure to follow-up with your lung doctor for recheck. -You can increase your home oxygen to 3 L if necessary. -Continue albuterol breathing treatments and/or DuoNeb inhaler. -Take your allergy medication daily. -Continue your Lasix as directed. -Seek additional medical attention if your symptoms worsen or persist, like new fever/chills, worsening shortness of breath despite treatment, chest pain, dizziness, etc.    ED Prescriptions    None     PDMP not reviewed this encounter.   Hazel Sams, PA-C  12/14/20 1633

## 2020-12-14 NOTE — Telephone Encounter (Signed)
Called and spoke to pt. She c/o upper right sided chest discomfort. Pt states she feels it is her lung. Pt states it is an achy pain, rating it at 4-5/10 at its worst. Pt states the pain is worse when she lies on her right side and when she sits up from lying down. Tylenol doesn't help. Pt states she has done some exercises and is unsure if its related to that. Pt states the discomfort has been present for a week with no change. Pt states she had some SOB, wheezing and chest congestion last week but is feeling better. Pt now denies cough and increase in sob - now at baseline. Pt states she has some occasional wheezing but this is relieved by Duoneb. Pt states she also has some chest congestion (now better than last week) and is taking Mucinex. Pt hasnt picked up her pred 7.51m tabs from the pharmacy yet, advised pt to do this and begin taking them as directed by Dr. MVaughan Browner pt verbalized understanding. Pt is requesting recs for the upper right chest discomfort. Pt last seen on 12/01/2020. Dr. MVaughan Browneris unavailable, will send to on call provider.   Dr. BLamonte Sakai please advise. Thanks.

## 2020-12-14 NOTE — Telephone Encounter (Signed)
Patient states it is in her lungs right side back not chest pain. She doesn't drive so she is going to see if she can get a ride here and she will call us back.  No appointment made no orders placed.  [name] has openings in patient calls back   Will route to Dr. Lamonte Sakai to let him know patient doesn't drive.

## 2020-12-20 NOTE — Addendum Note (Signed)
Encounter addended by: Lance Morin, RN on: 12/20/2020 2:24 PM  Actions taken: Clinical Note Signed, Episode resolved

## 2020-12-20 NOTE — Progress Notes (Signed)
Discharge Progress Report  Patient Details  Name: Tiffany Coleman MRN: [id number] Date of Birth: 10-24-1955 Referring Provider:   [facility] Walk Test from 10/01/2020 in [place]  Referring Provider Dr. Vaughan Browner       Number of Visits: 8  Reason for Discharge:  Patient has met program and personal goals.  Smoking History:  Social History   Tobacco Use  Smoking Status Former Smoker  . Years: 20.00  . Types: Cigarettes  . Quit date: 05/26/2020  . Years since quitting: 0.5  Smokeless Tobacco Never Used    Diagnosis:  Acute hypoxemic respiratory failure due to COVID-19 [facility])  ADL UCSD:  Pulmonary Assessment Scores    Row Name 10/01/20 1439 11/23/20 1540       ADL UCSD   ADL Phase Entry Exit    SOB Score total 54 --         CAT Score   CAT Score 30 --         mMRC Score   mMRC Score 4 4           Initial Exercise Prescription:  Initial Exercise Prescription - 10/01/20 1400      Date of Initial Exercise RX and Referring Provider   Date 10/01/20    Referring Provider Dr. Vaughan Browner    Expected Discharge Date 11/25/20      Oxygen   Oxygen Continuous    Liters 4-5      NuStep   Level 1    SPM 80    Minutes 30      Prescription Details   Frequency (times per week) 2    Duration Progress to 45 minutes of aerobic exercise without signs/symptoms of physical distress      Intensity   THRR 40-80% of Max Heartrate 62-123    Ratings of Perceived Exertion 11-13    Perceived Dyspnea 0-4      Progression   Progression Continue to progress workloads to maintain intensity without signs/symptoms of physical distress.      Resistance Training   Training Prescription Yes    Weight orange bands    Reps 10-15           Discharge Exercise Prescription (Final Exercise Prescription Changes):  Exercise Prescription Changes - 11/11/20 1600      Home Exercise Plan   Plans to continue exercise at Home  (comment)   Walking and resistance training   Frequency Add 2 additional days to program exercise sessions.    Initial Home Exercises Provided 11/11/20           Functional Capacity:  6 Minute Walk    Row Name 10/01/20 1410 11/23/20 1536       6 Minute Walk   Phase Initial Discharge    Distance 950 feet 1110 feet    Distance % Change -- 16.84 %    Distance Feet Change -- 160 ft    Walk Time 6 minutes 6 minutes    # of Rest Breaks 0 0    MPH 1.79 2.1    METS 2.31 2.72    RPE 11 9    Perceived Dyspnea  2 1    VO2 Peak 8.09 9.52    Symptoms Yes (comment) No    Comments chronic back pain and shortness of breath --    Resting HR 81 bpm 86 bpm    Resting BP 130/84 124/76    Resting Oxygen Saturation  99 %  100 %    Exercise Oxygen Saturation  during 6 min walk 83 % 89 %    Max Ex. HR 127 bpm 133 bpm    Max Ex. BP 166/86 174/82    2 Minute Post BP 142/80 134/78         Interval HR   1 Minute HR 106 99    2 Minute HR 110 112    3 Minute HR 114 118    4 Minute HR 113 125    5 Minute HR 124 130    6 Minute HR 127 133    2 Minute Post HR 80 88    Interval Heart Rate? Yes Yes         Interval Oxygen   Interval Oxygen? Yes Yes    Baseline Oxygen Saturation % 99 % 100 %    1 Minute Oxygen Saturation % 88 % 99 %    1 Minute Liters of Oxygen 3 L 4 L    2 Minute Oxygen Saturation % 83 % 94 %    2 Minute Liters of Oxygen 4 L 4 L    3 Minute Oxygen Saturation % 91 % 91 %    3 Minute Liters of Oxygen 4 L 4 L    4 Minute Oxygen Saturation % 90 % 91 %    4 Minute Liters of Oxygen 4 L 4 L    5 Minute Oxygen Saturation % 87 % 90 %    5 Minute Liters of Oxygen 4 L 4 L    6 Minute Oxygen Saturation % 84 % 89 %    6 Minute Liters of Oxygen 4 L 4 L    2 Minute Post Oxygen Saturation % 100 % 100 %    2 Minute Post Liters of Oxygen 4 L 4 L           Psychological, QOL, Others - Outcomes: PHQ 2/9: Depression screen [facility] 2/9 11/23/2020 10/01/2020 11/02/2017 05/29/2017 03/06/2017   Decreased Interest 1 0 0 0 0  Down, Depressed, Hopeless 0 2 0 0 0  PHQ - 2 Score 1 2 0 0 0  Altered sleeping 0 2 - - -  Tired, decreased energy 1 3 - - -  Change in appetite 0 0 - - -  Feeling bad or failure about yourself  0 0 - - -  Trouble concentrating 0 0 - - -  Moving slowly or fidgety/restless 0 0 - - -  Suicidal thoughts 0 0 - - -  PHQ-9 Score 2 7 - - -  Difficult doing work/chores Somewhat difficult Somewhat difficult - - -    Quality of Life:   Personal Goals: Goals established at orientation with interventions provided to work toward goal.  Personal Goals and Risk Factors at Admission - 10/01/20 1418      Core Components/Risk Factors/Patient Goals on Admission    Weight Management Yes;Obesity    Intervention Weight Management: Develop a combined nutrition and exercise program designed to reach desired caloric intake, while maintaining appropriate intake of nutrient and fiber, sodium and fats, and appropriate energy expenditure required for the weight goal.;Weight Management: Provide education and appropriate resources to help participant work on and attain dietary goals.;Weight Management/Obesity: Establish reasonable short term and long term weight goals.;Obesity: Provide education and appropriate resources to help participant work on and attain dietary goals.    Improve shortness of breath with ADL's Yes    Intervention Provide education, individualized exercise plan and  daily activity instruction to help decrease symptoms of SOB with activities of daily living.    Expected Outcomes Short Term: Improve cardiorespiratory fitness to achieve a reduction of symptoms when performing ADLs;Long Term: Be able to perform more ADLs without symptoms or delay the onset of symptoms            Personal Goals Discharge:  Goals and Risk Factor Review    Row Name 10/11/20 1319 11/08/20 1311 11/08/20 1312         Core Components/Risk Factors/Patient Goals Review   Personal Goals  Review Develop more efficient breathing techniques such as purse lipped breathing and diaphragmatic breathing and practicing self-pacing with activity.;Increase knowledge of respiratory medications and ability to use respiratory devices properly.;Improve shortness of breath with ADL's Develop more efficient breathing techniques such as purse lipped breathing and diaphragmatic breathing and practicing self-pacing with activity.;Increase knowledge of respiratory medications and ability to use respiratory devices properly.;Improve shortness of breath with ADL's Develop more efficient breathing techniques such as purse lipped breathing and diaphragmatic breathing and practicing self-pacing with activity.;Increase knowledge of respiratory medications and ability to use respiratory devices properly.;Improve shortness of breath with ADL's     Review Tiffany Coleman is learning about living with supplemental oxygen which is quite a lifestyle change.  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 -- Tiffany Coleman is making slow progress, she has been absent 3 times for various reasons, she is deconditioned since having covid-19, she is using supplemental oxygen.     Expected Outcomes See admission goals. -- See admission goals.            Exercise Goals and Review:  Exercise Goals    Row Name 10/01/20 1409             Exercise Goals   Increase Physical Activity Yes       Intervention Provide advice, education, support and counseling about physical activity/exercise needs.;Develop an individualized exercise prescription for aerobic and resistive training based on initial evaluation findings, risk stratification, comorbidities and participant's personal goals.       Expected Outcomes Short Term: Attend rehab on a regular basis to increase amount of physical activity.;Long Term: Add in home exercise to make exercise part of routine and to  increase amount of physical activity.;Long Term: Exercising regularly at least 3-5 days a week.       Increase Strength and Stamina Yes       Intervention Provide advice, education, support and counseling about physical activity/exercise needs.;Develop an individualized exercise prescription for aerobic and resistive training based on initial evaluation findings, risk stratification, comorbidities and participant's personal goals.       Expected Outcomes Short Term: Increase workloads from initial exercise prescription for resistance, speed, and METs.;Short Term: Perform resistance training exercises routinely during rehab and add in resistance training at home;Long Term: Improve cardiorespiratory fitness, muscular endurance and strength as measured by increased METs and functional capacity (6MWT)       Able to understand and use rate of perceived exertion (RPE) scale Yes       Intervention Provide education and explanation on how to use RPE scale       Expected Outcomes Short Term: Able to use RPE daily in rehab to express subjective intensity level;Long Term:  Able to use RPE to guide intensity level when exercising independently       Able to understand and use Dyspnea scale Yes  Intervention Provide education and explanation on how to use Dyspnea scale       Expected Outcomes Short Term: Able to use Dyspnea scale daily in rehab to express subjective sense of shortness of breath during exertion;Long Term: Able to use Dyspnea scale to guide intensity level when exercising independently       Knowledge and understanding of Target Heart Rate Range (THRR) Yes       Intervention Provide education and explanation of THRR including how the numbers were predicted and where they are located for reference       Expected Outcomes Long Term: Able to use THRR to govern intensity when exercising independently;Short Term: Able to state/look up THRR;Short Term: Able to use daily as guideline for intensity in  rehab       Understanding of Exercise Prescription Yes       Intervention Provide education, explanation, and written materials on patient's individual exercise prescription       Expected Outcomes Short Term: Able to explain program exercise prescription;Long Term: Able to explain home exercise prescription to exercise independently              Exercise Goals Re-Evaluation:  Exercise Goals Re-Evaluation    Row Name 10/11/20 1346 10/11/20 1420 11/09/20 1312         Exercise Goal Re-Evaluation   Exercise Goals Review Increase Physical Activity;Increase Strength and Stamina;Able to understand and use rate of perceived exertion (RPE) scale;Able to understand and use Dyspnea scale;Knowledge and understanding of Target Heart Rate Range (THRR);Understanding of Exercise Prescription (P)  Increase Physical Activity;Increase Strength and Stamina;Able to understand and use rate of perceived exertion (RPE) scale;Able to understand and use Dyspnea scale;Knowledge and understanding of Target Heart Rate Range (THRR);Understanding of Exercise Prescription Increase Physical Activity;Increase Strength and Stamina;Able to understand and use rate of perceived exertion (RPE) scale;Able to understand and use Dyspnea scale;Knowledge and understanding of Target Heart Rate Range (THRR);Understanding of Exercise Prescription     Comments -- Pt has completed 2 exercise sessions. She is very deconditioned and is dependent on staff to help her with equipment and exercises. It is too early to see any progression, but will continue to monitor and progress as she is able. She is currently exercising at 1.9 METS on the Nustep. She is able to do the resistance bands but has to do them seated due to balance concerns. Tiffany Coleman has completed 6 exercise sessions and has been slow to make progressions due to deconditioning. She is still dependent on staff to help set-up equipment but she is becoming independent with resistance  exercises and stretches. She is exercising at 2.0 METS on level 2 on the Nustep. Will continue to monitor and progress as she is able. Will also discuss home exercise prescription in the upcoming weeks.     Expected Outcomes -- Through exercise at rehab and home the patient will decrease shortness of breath with daily activities and feel confident in carrying out and exercise regimn at home. Through exercise at rehab and home the patient will decrease shortness of breath with daily activities and feel confident in carrying out and exercise regimn at home.            Nutrition & Weight - Outcomes:  Pre Biometrics - 10/01/20 1409      Pre Biometrics   Grip Strength 25 kg           Post Biometrics - 11/23/20 1540       Post  Biometrics  Grip Strength 33 kg           Nutrition:  Nutrition Therapy & Goals - 10/14/20 1447      Nutrition Therapy   Diet Low sodium      Personal Nutrition Goals   Nutrition Goal Pt to identify and limit food sources of saturated fat, trans fat, refined carbohydrates and sodium    Personal Goal #2 Pt to identify food quantities necessary to achieve weight loss of 6-24 lb at graduation from pulmonary rehab      [place], educate and counsel regarding individualized specific dietary modifications aiming towards targeted core components such as weight, hypertension, lipid management, diabetes, heart failure and other comorbidities.;Nutrition handout(s) given to patient.    Expected Outcomes Short Term Goal: A plan has been developed with personal nutrition goals set during dietitian appointment.;Long Term Goal: Adherence to prescribed nutrition plan.           Nutrition Discharge:   Education Questionnaire Score:   Goals reviewed with patient; copy given to patient.

## 2020-12-28 ENCOUNTER — Telehealth: Payer: Self-pay | Admitting: Pulmonary Disease

## 2020-12-28 DIAGNOSIS — R0602 Shortness of breath: Secondary | ICD-10-CM

## 2020-12-28 NOTE — Telephone Encounter (Signed)
Go back up on the prednisone to 20 mg Please make an appointment to see me at next available to discuss alternate therapies as we are not able to wean her off the prednisone completely  Order CBC, comprehensive metabolic panel and TPMT levels

## 2020-12-28 NOTE — Telephone Encounter (Signed)
Called and spoke with pt and she stated that she was changed to the 7.5 mg  Prednisone and that this is not helping her out.  She stated that she has doubled the dose to see if this helps but that has not really helped either.  She is wanting to go back to the other dose.  She said that she gets winded too quickly on the lower dose and her energy gives out too fast.  She just said that it does not seem to be helping and would like to go back to the prednisone dose that she was on before.  PM please advise. Thanks   Pt has a pending appt in June.

## 2020-12-28 NOTE — Telephone Encounter (Signed)
Spoke to patient and relayed below message.  She would like prednisone to 60m. She does not feel that she needs 244m  Patient stated that she has enough prednisone to get her by until her next visit.  OV scheduled for 02/08/2021 at 9:45a. Labs have been ordered. Nothing further needed at this time.   Routing to Dr. MaVaughan Browners an FYJuluis Rainieror prednisone dosage.

## 2020-12-30 ENCOUNTER — Telehealth: Payer: Self-pay | Admitting: Pulmonary Disease

## 2020-12-30 NOTE — Telephone Encounter (Signed)
Called and spoke to pt. Pt states she is still not feeling well; c/o fatigue, increase SOB, wheezing, and right sided "lung pain". Pt states this lung pain is an constant achy pain and rates it at 4/10 and nothing helps it feel better. Lung pain has been present x 2-3 days. Pt called in on 5/17 (see phone note) and Dr. Vaughan Browner advised pt to increase pred to 62m. Pt declined the 280mof pred at that time as she doesn't like the side effects of the higher dose. Pt is ok going back on the 2025mf pred but questioned how long. Pt has an upcoming appt on 02/08/2021 with Dr. ManVaughan Brownerirst available) but pt is apprehensive to take the 56m16med for that long. Dr. MannVaughan Brownerunavailable, will send to provider on call.   Tammy, please advise if ok to schedule with an APP next week and should pt take the 56mg4ml that appt? Thanks.

## 2020-12-30 NOTE — Telephone Encounter (Signed)
Recent chest x-ray in the emergency room on May 3 showed stable chronic infiltrates from COVID-19  Can increase prednisone to 20 mg daily for 1 week then 15 mg daily for 1 week and then return to 10 mg daily until seen back in the office.  Please contact office for sooner follow up if symptoms do not improve or worsen or seek emergency care   We will send to Dr. Vaughan Browner for any additional recs

## 2020-12-30 NOTE — Telephone Encounter (Signed)
ATC patient, LMTCB

## 2020-12-31 MED ORDER — PREDNISONE 10 MG PO TABS: 10.0000 mg | ORAL_TABLET | Freq: Every day | ORAL | 1 refills | Status: DC

## 2020-12-31 NOTE — Telephone Encounter (Signed)
I spoke with patient regarding Tiffany Coleman recs. Patient verbalized understanding and I sent in order for Prednisone to preferred pharmacy. Patient was informed to call if symptoms do not improve or go to ED if symptoms worse. Nothing further needed.

## 2020-12-31 NOTE — Telephone Encounter (Signed)
ATC patient, LMTCB 

## 2021-01-17 ENCOUNTER — Telehealth: Payer: Self-pay | Admitting: Pulmonary Disease

## 2021-01-17 ENCOUNTER — Other Ambulatory Visit: Payer: Self-pay | Admitting: Adult Health

## 2021-01-17 NOTE — Telephone Encounter (Signed)
I called and spoke with patient who stated she has lost her bottle of prednisone that was just filled on 12/31/20 and requesting a script be sent in again. I did send in the script to preferred pharmacy but informed patient that insurance may not cover it due to it just being filled. I notified some insurances will do an emergency script usually one time and I will route this to Dr. Vaughan Browner for any additional recs. Patient verbalized understanding and will call back if hear back from pharmacy before we do.  Dr. Vaughan Browner, please advise. Thanks!

## 2021-01-18 MED ORDER — PREDNISONE 10 MG PO TABS: 10.0000 mg | ORAL_TABLET | Freq: Every day | ORAL | 1 refills | Status: DC

## 2021-01-18 NOTE — Telephone Encounter (Signed)
Order for prednisone signed

## 2021-01-18 NOTE — Telephone Encounter (Signed)
[place] and spoke with Pharmacist.  Patient picked up additional Prednisone prescription this morning.  Nothing further at this time.

## 2021-02-08 ENCOUNTER — Other Ambulatory Visit: Payer: Self-pay

## 2021-02-08 ENCOUNTER — Encounter: Payer: Self-pay | Admitting: Pulmonary Disease

## 2021-02-08 ENCOUNTER — Ambulatory Visit (INDEPENDENT_AMBULATORY_CARE_PROVIDER_SITE_OTHER): Payer: 59 | Admitting: Pulmonary Disease

## 2021-02-08 VITALS — BP 130/80 | HR 78 | Temp 98.2°F | Ht 63.0 in | Wt 242.8 lb

## 2021-02-08 DIAGNOSIS — R0602 Shortness of breath: Secondary | ICD-10-CM

## 2021-02-08 DIAGNOSIS — I5030 Unspecified diastolic (congestive) heart failure: Secondary | ICD-10-CM

## 2021-02-08 DIAGNOSIS — U099 Post covid-19 condition, unspecified: Secondary | ICD-10-CM

## 2021-02-08 LAB — CBC WITH DIFFERENTIAL/PLATELET
Basophils Absolute: 0.1 10*3/uL (ref 0.0–0.1)
Basophils Relative: 0.8 % (ref 0.0–3.0)
Eosinophils Absolute: 0.3 10*3/uL (ref 0.0–0.7)
Eosinophils Relative: 2.7 % (ref 0.0–5.0)
HCT: 34.4 % — ABNORMAL LOW (ref 36.0–46.0)
Hemoglobin: 11 g/dL — ABNORMAL LOW (ref 12.0–15.0)
Lymphocytes Relative: 23 % (ref 12.0–46.0)
Lymphs Abs: 2.3 10*3/uL (ref 0.7–4.0)
MCHC: 32.1 g/dL (ref 30.0–36.0)
MCV: 82.3 fl (ref 78.0–100.0)
Monocytes Absolute: 0.8 10*3/uL (ref 0.1–1.0)
Monocytes Relative: 8.4 % (ref 3.0–12.0)
Neutro Abs: 6.4 10*3/uL (ref 1.4–7.7)
Neutrophils Relative %: 65.1 % (ref 43.0–77.0)
Platelets: 242 10*3/uL (ref 150.0–400.0)
RBC: 4.19 Mil/uL (ref 3.87–5.11)
RDW: 15.5 % (ref 11.5–15.5)
WBC: 9.8 10*3/uL (ref 4.0–10.5)

## 2021-02-08 LAB — COMPREHENSIVE METABOLIC PANEL
ALT: 14 U/L (ref 0–35)
AST: 12 U/L (ref 0–37)
Albumin: 3.6 g/dL (ref 3.5–5.2)
Alkaline Phosphatase: 60 U/L (ref 39–117)
BUN: 18 mg/dL (ref 6–23)
CO2: 35 mEq/L — ABNORMAL HIGH (ref 19–32)
Calcium: 9.9 mg/dL (ref 8.4–10.5)
Chloride: 98 mEq/L (ref 96–112)
Creatinine, Ser: 0.91 mg/dL (ref 0.40–1.20)
GFR: 65.43 mL/min (ref >60.00)
Glucose, Bld: 106 mg/dL — ABNORMAL HIGH (ref 70–99)
Potassium: 3.7 mEq/L (ref 3.5–5.1)
Sodium: 139 mEq/L (ref 135–145)
Total Bilirubin: 0.4 mg/dL (ref 0.2–1.2)
Total Protein: 6.5 g/dL (ref 6.0–8.3)

## 2021-02-08 MED ORDER — PREDNISONE 20 MG PO TABS: 20.0000 mg | ORAL_TABLET | Freq: Every day | ORAL | 5 refills | Status: DC

## 2021-02-08 MED ORDER — AZATHIOPRINE 50 MG PO TABS: 50.0000 mg | ORAL_TABLET | Freq: Every day | ORAL | 5 refills | Status: DC

## 2021-02-08 NOTE — Progress Notes (Signed)
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  No PE on CTA but imaging shows extensive bilateral infiltrates. 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 She has since resumed it after Education officer, museum noticed the discrepancy and she is now at 20 mg a day. 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 No mold, hot tub, Jacuzzi Smoking history: Never smoker Travel history: Originally from [place]. No significant recent travel Relevant family history: No significant family issue of lung disease.  Interim history: Continues on prednisone at 20 mg. We have made multiple attempts to taper steroids but every time she gets below 10 mg her dyspnea worsens Continues on Lasix for lower extremity edema  Outpatient Encounter Medications as of 02/08/2021  Medication Sig   acetaminophen (TYLENOL) 325 MG tablet Take 2 tablets (650 mg total) by mouth every 6 (six) hours as needed for mild pain or headache (fever >/= 101).   albuterol (VENTOLIN HFA) 108 (90 Base) MCG/ACT inhaler Inhale 2 puffs into the lungs every 6 (six) hours as needed for wheezing or shortness of breath.   fluticasone (FLONASE) 50 MCG/ACT nasal spray Place  2 sprays into both nostrils daily.   furosemide (LASIX) 20 MG tablet Take 20 mg by mouth daily.   gabapentin (NEURONTIN) 300 MG capsule Take 300 mg by mouth 3 (three) times daily as needed (nerve pain).    ipratropium-albuterol (DUONEB) 0.5-2.5 (3) MG/3ML SOLN Take 3 mLs by nebulization every 6 (six) hours as needed.   pantoprazole (PROTONIX) 40 MG tablet Take 1 tablet (40 mg total) by mouth daily at 6 (six) AM.   predniSONE (DELTASONE) 10 MG tablet Take 1 tablet (10 mg total) by mouth daily with breakfast. Take 41m for 7 days, then 151mfor 7 days then 1078maily until next appt.  Sending script in again because patient has lost bottle and cannot find out and is experiencing breathing issues. Thanks!   zolpidem (AMBIEN) 10 MG tablet Take 10 mg by mouth at bedtime as needed for sleep.  (Patient not taking: Reported on 02/08/2021)   [DISCONTINUED] clotrimazole (MYCELEX) 10 MG troche Take 1 tablet (10 mg total) by mouth 5 (five) times daily.   [DISCONTINUED] predniSONE (DELTASONE) 5 MG tablet Take 7.5mg45mily   No facility-administered encounter medications on file as of 02/08/2021.    Physical Exam: Blood pressure 130/80, pulse 78, temperature 98.2 F (36.8 C), temperature source Oral, height 5' 3" (1.6 m), weight 242 lb 12.8 oz (110.1 kg), SpO2 100 %. Gen:      No acute distress HEENT:  EOMI, sclera anicteric Neck:     No masses; no thyromegaly Lungs:    Clear to auscultation bilaterally; normal respiratory effort CV:  Regular rate and rhythm; no murmurs Abd:      + bowel sounds; soft, non-tender; no palpable masses, no distension Ext:    No edema; adequate peripheral perfusion Skin:      Warm and dry; no rash Neuro: alert and oriented x 3 Psych: normal mood and affect   Data Reviewed: Imaging: CTA 05/26/2020-no PE, diffuse bilateral interstitial infiltrates and groundglass opacities.  Overall improved compared to prior CT. Chest x-ray 07/13/2020 -persistent bilateral pulmonary  infiltrates.  I have reviewed the images personally. High-res CT 10/01/2020-improvement in groundglass opacities with development of fibrotic changes consistent with evolving post-COVID ILD.  I have reviewed the images personally.  PFTs: 08/31/2020 FVC 1.35 [50%], FEV1 1.28 [69%], F/F 95, TLC 1.99 [40%], DLCO 8.42 [44%] Severe restriction, diffusion defect  Labs: CBC 07/13/2020-WBC 10.8, eos 0.1%, absolute eosinophil count 11 CMP 07/13/2020-normal except for slight increase in bicarb and glucose  Cardiac: Echocardiogram 04/24/2020-LVEF 60 to 65%, moderate concentric LVH, RV systolic size and function are normal.  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  High-res CT reviewed with improvement in groundglass opacities with development of fibrotic changes consistent with evolving post-COVID ILD.   Since we are unable to come off prednisone I will start her on a steroid sparing agent such as azathioprine 50 mg TPMT levels are pending Check ILD panel to make sure were not missing underlying connective tissue disease Pulmonary rehab on hold due to desats.  We can send a referral in a few months when she is better Continue supplemental oxygen.    Asthma She has history of asthma but no obstruction on PFTs.  She has not tolerated Symbicort due to thrush Continue duo nebs, albuterol as needed  HFpEF Echo reviewed with EF 60-65%, LVH Continue Lasix Check N-terminal proBNP Referral to cardiology  OSA Intolerant of CPAP and is not interested in retrying  Plan/Recommendations: Continue prednisone Start azathioprine Follow ILD panel, TPMT levels and proBNP Referral to cardiology Follow-up with high-res CT and PFTs in 3 months  Marshell Garfinkel MD [facility] 02/08/2021, 9:52 AM  CC: Nolene Ebbs, MD

## 2021-02-08 NOTE — Patient Instructions (Signed)
We will get additional labs today including ILD panel, proBNP for dyspnea Start azathioprine 50 mg a day Continue prednisone Referral to cardiology for diastolic heart failure  Follow-up with high-res CT and PFTs in 3 months

## 2021-02-10 ENCOUNTER — Other Ambulatory Visit: Payer: Self-pay | Admitting: Internal Medicine

## 2021-02-10 DIAGNOSIS — Z1231 Encounter for screening mammogram for malignant neoplasm of breast: Secondary | ICD-10-CM

## 2021-02-11 LAB — ANTI-NUCLEAR AB-TITER (ANA TITER): ANA Titer 1: 1:320 {titer} — ABNORMAL HIGH

## 2021-02-11 LAB — ANA,IFA RA DIAG PNL W/RFLX TIT/PATN
Anti Nuclear Antibody (ANA): POSITIVE — AB
Cyclic Citrullin Peptide Ab: <16 UNITS
Rheumatoid fact SerPl-aCnc: <14 IU/mL (ref <14)

## 2021-02-11 LAB — SJOGREN'S SYNDROME ANTIBODS(SSA + SSB)
SSA (Ro) (ENA) Antibody, IgG: <1 AI
SSB (La) (ENA) Antibody, IgG: <1 AI

## 2021-02-11 LAB — ANTI-SCLERODERMA ANTIBODY: Scleroderma (Scl-70) (ENA) Antibody, IgG: <1 AI

## 2021-02-11 LAB — ANCA SCREEN W REFLEX TITER: ANCA Screen: NEGATIVE

## 2021-02-12 LAB — HYPERSENSITIVITY PNEUMONITIS
A. Pullulans Abs: NEGATIVE
A.Fumigatus #1 Abs: NEGATIVE
Micropolyspora faeni, IgG: NEGATIVE
Pigeon Serum Abs: NEGATIVE
Thermoact. Saccharii: NEGATIVE
Thermoactinomyces vulgaris, IgG: NEGATIVE

## 2021-02-12 LAB — THIOPURINE METHYLTRANSFERASE (TPMT), RBC: Thiopurine Methyltransferase, RBC: 17 nmol/hr/mL RBC

## 2021-02-12 LAB — PRO B NATRIURETIC PEPTIDE: NT-Pro BNP: 86 pg/mL (ref 0–301)

## 2021-02-16 ENCOUNTER — Telehealth: Payer: Self-pay | Admitting: Pulmonary Disease

## 2021-02-16 NOTE — Telephone Encounter (Signed)
Will route message to MR to see if he would be able to lend in recommendations since PM is on vacation.   MR would you mind taking a look at this patients labs and lending in recommendations. PM is on vacation and will not return until the 18th. Thanks :)

## 2021-02-17 NOTE — Telephone Encounter (Signed)
There is no urgency to the results and they have to be intepreted in the clinical context when Dr Vaughan Browner returns  He tested autoimmune seriology -ANA is positive but only a bit. Might not mean anything He wanted to start immuirant to help her reduce steroids. The safety lab for that was fine But he has to make call on that when he returns.     Results for Tiffany Coleman, Tiffany Coleman (MRN [id number]) as of 02/17/2021 17:34  Ref. Range 02/08/2021 09:00 02/08/2021 09:35 02/08/2021 10:18  WBC Latest Ref Range: 4.0 - 10.5 K/uL 9.8    RBC Latest Ref Range: 3.87 - 5.11 Mil/uL 4.19    Hemoglobin Latest Ref Range: 12.0 - 15.0 g/dL 11.0 (L)    HCT Latest Ref Range: 36.0 - 46.0 % 34.4 (L)    MCV Latest Ref Range: 78.0 - 100.0 fl 82.3    MCHC Latest Ref Range: 30.0 - 36.0 g/dL 32.1    RDW Latest Ref Range: 11.5 - 15.5 % 15.5    Platelets Latest Ref Range: 150.0 - 400.0 K/uL 242.0    Neutrophils Latest Ref Range: 43.0 - 77.0 % 65.1    Lymphocytes Latest Ref Range: 12.0 - 46.0 % 23.0    Monocytes Relative Latest Ref Range: 3.0 - 12.0 % 8.4    Eosinophil Latest Ref Range: 0.0 - 5.0 % 2.7    Basophil Latest Ref Range: 0.0 - 3.0 % 0.8    NEUT# Latest Ref Range: 1.4 - 7.7 K/uL 6.4    Lymphocyte # Latest Ref Range: 0.7 - 4.0 K/uL 2.3    Monocyte # Latest Ref Range: 0.1 - 1.0 K/uL 0.8    Eosinophils Absolute Latest Ref Range: 0.0 - 0.7 K/uL 0.3    Basophils Absolute Latest Ref Range: 0.0 - 0.1 K/uL 0.1    THIOPURINE METHYLTRANSFERASE (TPMT), RBC Unknown  Rpt   Thiopurine Methyltransferase, RBC Latest Units: nmol/hr/mL RBC  17   Glucose Latest Ref Range: 70 - 99 mg/dL 106 (H)    Anti Nuclear Antibody (ANA) Latest Ref Range: NEGATIVE    POSITIVE (A)  ANA Pattern 1 Unknown   Cytoplasmic, Discrete Dots/GW body (A)  ANA Titer 1 Latest Units: titer   5:284 (H)  Cyclic Citrullin Peptide Ab Latest Units: UNITS   <16  RA Latex Turbid. Latest Ref Range: <14 IU/mL   <14  ANA,IFA RA DIAG PNL W/RFLX TIT/PATN Unknown   Rpt (A)   SSA (Ro) (ENA) Antibody, IgG Latest Ref Range: <1.0 NEG AI   <1.0 NEG  SSB (La) (ENA) Antibody, IgG Latest Ref Range: <1.0 NEG AI   <1.0 NEG  Scleroderma (Scl-70) (ENA) Antibody, IgG Latest Ref Range: <1.0 NEG AI   <1.0 NEG

## 2021-02-18 NOTE — Telephone Encounter (Signed)
Called and spoke with pt letting her know the info stated by MR and that MR wanted Dr. Vaughan Browner to further review once he returned to the office to decide if he wants to change anything with pt's current plan of care. Pt verbalized understanding.  While speaking with pt, pt stated that she has not heard anything from cardiology after we placed the referral at last OV. PCCs, please advise.

## 2021-02-23 LAB — MYOMARKER 3 PLUS PROFILE (RDL)

## 2021-02-25 NOTE — Telephone Encounter (Signed)
Chan sent this to Cardiology on 02/08/21.  Cardiology is behind on their referrals for the [facility], We will call provide pt w/ an update once we have rec'd one from [place].

## 2021-03-01 ENCOUNTER — Emergency Department (HOSPITAL_BASED_OUTPATIENT_CLINIC_OR_DEPARTMENT_OTHER): Payer: 59 | Admitting: Radiology

## 2021-03-01 ENCOUNTER — Emergency Department (HOSPITAL_BASED_OUTPATIENT_CLINIC_OR_DEPARTMENT_OTHER): Payer: 59

## 2021-03-01 ENCOUNTER — Emergency Department (HOSPITAL_BASED_OUTPATIENT_CLINIC_OR_DEPARTMENT_OTHER)
Admission: EM | Admit: 2021-03-01 | Discharge: 2021-03-01 | Disposition: A | Discharge status: Home / Self Care | Payer: 59 | Attending: Emergency Medicine | Admitting: Emergency Medicine

## 2021-03-01 ENCOUNTER — Other Ambulatory Visit: Payer: Self-pay

## 2021-03-01 ENCOUNTER — Encounter (HOSPITAL_BASED_OUTPATIENT_CLINIC_OR_DEPARTMENT_OTHER): Payer: Self-pay | Admitting: Obstetrics and Gynecology

## 2021-03-01 DIAGNOSIS — R109 Unspecified abdominal pain: Secondary | ICD-10-CM | POA: Diagnosis not present

## 2021-03-01 DIAGNOSIS — Z8616 Personal history of COVID-19: Secondary | ICD-10-CM | POA: Diagnosis not present

## 2021-03-01 DIAGNOSIS — I509 Heart failure, unspecified: Secondary | ICD-10-CM | POA: Diagnosis not present

## 2021-03-01 DIAGNOSIS — Z87891 Personal history of nicotine dependence: Secondary | ICD-10-CM | POA: Insufficient documentation

## 2021-03-01 DIAGNOSIS — Z91040 Latex allergy status: Secondary | ICD-10-CM | POA: Diagnosis not present

## 2021-03-01 DIAGNOSIS — R42 Dizziness and giddiness: Secondary | ICD-10-CM | POA: Insufficient documentation

## 2021-03-01 DIAGNOSIS — I110 Hypertensive heart disease with heart failure: Secondary | ICD-10-CM | POA: Insufficient documentation

## 2021-03-01 DIAGNOSIS — R0602 Shortness of breath: Secondary | ICD-10-CM | POA: Insufficient documentation

## 2021-03-01 DIAGNOSIS — Z79899 Other long term (current) drug therapy: Secondary | ICD-10-CM | POA: Insufficient documentation

## 2021-03-01 LAB — BASIC METABOLIC PANEL
Anion gap: 7 (ref 5–15)
BUN: 13 mg/dL (ref 8–23)
CO2: 34 mmol/L — ABNORMAL HIGH (ref 22–32)
Calcium: 9.5 mg/dL (ref 8.9–10.3)
Chloride: 99 mmol/L (ref 98–111)
Creatinine, Ser: 0.87 mg/dL (ref 0.44–1.00)
GFR, Estimated: >60 mL/min (ref >60)
Glucose, Bld: 103 mg/dL — ABNORMAL HIGH (ref 70–99)
Potassium: 4.3 mmol/L (ref 3.5–5.1)
Sodium: 140 mmol/L (ref 135–145)

## 2021-03-01 LAB — TROPONIN I (HIGH SENSITIVITY)
Troponin I (High Sensitivity): 8 ng/L (ref <18)
Troponin I (High Sensitivity): 7 ng/L (ref <18)

## 2021-03-01 LAB — HEPATIC FUNCTION PANEL
ALT: 10 U/L (ref 0–44)
AST: 11 U/L — ABNORMAL LOW (ref 15–41)
Albumin: 3.9 g/dL (ref 3.5–5.0)
Alkaline Phosphatase: 53 U/L (ref 38–126)
Bilirubin, Direct: 0.1 mg/dL (ref 0.0–0.2)
Indirect Bilirubin: 0.4 mg/dL (ref 0.3–0.9)
Total Bilirubin: 0.5 mg/dL (ref 0.3–1.2)
Total Protein: 6.7 g/dL (ref 6.5–8.1)

## 2021-03-01 LAB — CBC
HCT: 39.6 % (ref 36.0–46.0)
Hemoglobin: 11.9 g/dL — ABNORMAL LOW (ref 12.0–15.0)
MCH: 26.4 pg (ref 26.0–34.0)
MCHC: 30.1 g/dL (ref 30.0–36.0)
MCV: 87.8 fL (ref 80.0–100.0)
Platelets: 240 10*3/uL (ref 150–400)
RBC: 4.51 MIL/uL (ref 3.87–5.11)
RDW: 15 % (ref 11.5–15.5)
WBC: 11.3 10*3/uL — ABNORMAL HIGH (ref 4.0–10.5)
nRBC: 0 % (ref 0.0–0.2)

## 2021-03-01 MED ORDER — IOHEXOL 300 MG/ML  SOLN
100.0000 mL | Freq: Once | INTRAMUSCULAR | Status: AC | PRN
  Administered 2021-03-01: 100 mL via INTRAVENOUS

## 2021-03-01 MED ORDER — KETOROLAC TROMETHAMINE 30 MG/ML IJ SOLN
30.0000 mg | Freq: Once | INTRAMUSCULAR | Status: AC
  Administered 2021-03-01: 30 mg via INTRAVENOUS
  Filled 2021-03-01: qty 1

## 2021-03-01 NOTE — Telephone Encounter (Signed)
Staff Message sent to [facility] to obtain an update on this order.  This is who Chantel previously sent this referral to.

## 2021-03-01 NOTE — ED Triage Notes (Signed)
Patient reports to the ER for ShOB, dizziness, and right sided flank pain. Patient reports her eyes are watering. Patient states she has [place] and is on daily prednisone

## 2021-03-01 NOTE — Discharge Instructions (Addendum)
You have been seen and discharged from the emergency department.  Your blood work and CAT scan imaging was all stable for you.  Follow-up with your primary provider for reevaluation and further care. Take home medications as prescribed. If you have any worsening symptoms or further concerns for your health please return to an emergency department for further evaluation.

## 2021-03-01 NOTE — ED Provider Notes (Signed)
[facility] Provider Note   CSN: [id number] Arrival date & time: 03/01/21  1425     History Chief Complaint  Patient presents with   Flank Pain   Shortness of Breath   Dizziness    Tiffany Coleman is a 65 y.o. female.  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  Patient states over the past week she has been having increased work of breathing and shortness of breath.  Denies any cough or chest pain but she has now developed pain that extends from her right lower ribs down to her right hip.  Does not radiate into her anterior abdomen.  She denies any nausea/vomiting/diarrhea or other genitourinary symptoms.  At times she feels lightheaded from the shortness of breath but denies any syncope or palpitations.  No swelling of her lower extremities.  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   Acute respiratory failure with hypoxemia ([place]) 05/27/2020   Anemia 05/27/2020   Asthma 05/27/2020   History of COVID-19    Acute respiratory failure with hypoxia ([place]) 05/26/2020   Acute hypoxemic respiratory failure due to COVID-19 ([place]) 04/05/2020   Elevated BP without diagnosis of hypertension 04/25/2019   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   Teeth missing 05/30/2017   GERD (gastroesophageal reflux disease) 05/30/2017   Hypertension 03/08/2017   Epigastric pain 03/06/2017   Seasonal allergies 01/19/2017   Morbid obesity ([place]) 01/19/2017   Generalized anxiety disorder 10/05/2016   History of colonic polyps 06/03/2016   Obstructive sleep apnea 06/02/2016   Recurrent ventral hernia 09/08/2013    Past Surgical History:  Procedure Laterality Date   ABDOMINAL HYSTERECTOMY     partial   BREAST SURGERY Bilateral    cyst removed   COLON SURGERY     COLONOSCOPY WITH PROPOFOL N/A 05/29/2017   Procedure: COLONOSCOPY WITH PROPOFOL;  Surgeon: Wilford Corner, MD;  Location: [facility];  Service: Endoscopy;  Laterality: N/A;   FINGER SURGERY     right pinkie x 3   HERNIA REPAIR     KNEE SURGERY Right 2017   PARTIAL COLECTOMY Left ~2004   for obstruction   right foot surgery  yrs ago   stomach tumur     [id number] benign     OB History     Gravida  7   Para      Term      Preterm      AB  3   Living  3      SAB  2   IAB  1   Ectopic      Multiple      Live Births  4  Family History  Problem Relation Age of Onset   Deep vein thrombosis Mother    Diabetes Mother    Hypertension Mother    Varicose Veins Mother    Cancer Father        colon   Hypertension Sister    Hyperlipidemia Brother    Hypertension Daughter    Heart attack Son    Cancer Maternal Uncle     Social History   Tobacco Use   Smoking status: Former    Years: 20.00    Types: Cigarettes    Quit date: 05/26/2020    Years since quitting: 0.7   Smokeless tobacco: Never  Vaping Use   Vaping Use: Never used  Substance Use Topics   Alcohol use: Yes    Comment: Occ   Drug use: No    Home Medications Prior to Admission medications   Medication Sig Start Date End Date Taking? Authorizing Provider  acetaminophen (TYLENOL) 325 MG tablet Take 2 tablets (650 mg total) by mouth every 6  (six) hours as needed for mild pain or headache (fever >/= 101). 05/11/20   Arrien, Jimmy Picket, MD  albuterol (VENTOLIN HFA) 108 (90 Base) MCG/ACT inhaler Inhale 2 puffs into the lungs every 6 (six) hours as needed for wheezing or shortness of breath. 09/06/20   Parrett, Fonnie Mu, NP  azaTHIOprine (IMURAN) 50 MG tablet Take 1 tablet (50 mg total) by mouth daily. 02/08/21   [name], Hart Robinsons, MD  fluticasone (FLONASE) 50 MCG/ACT nasal spray Place 2 sprays into both nostrils daily. 06/01/20   Eugenie Filler, MD  furosemide (LASIX) 20 MG tablet Take 20 mg by mouth daily.    [provider]  gabapentin (NEURONTIN) 300 MG capsule Take 300 mg by mouth 3 (three) times daily as needed (nerve pain).  07/17/19   [provider]  ipratropium-albuterol (DUONEB) 0.5-2.5 (3) MG/3ML SOLN Take 3 mLs by nebulization every 6 (six) hours as needed. 12/14/20   [name], Hart Robinsons, MD  pantoprazole (PROTONIX) 40 MG tablet Take 1 tablet (40 mg total) by mouth daily at 6 (six) AM. 06/01/20   Eugenie Filler, MD  predniSONE (DELTASONE) 20 MG tablet Take 1 tablet (20 mg total) by mouth daily with breakfast. 02/08/21   Marshell Garfinkel, MD    Allergies    Latex, Penicillins, Tape, Bactrim [sulfamethoxazole-trimethoprim], and Codeine  Review of Systems   Review of Systems  Constitutional:  Positive for fatigue. Negative for chills and fever.  HENT:  Negative for congestion.   Eyes:  Negative for visual disturbance.  Respiratory:  Positive for shortness of breath.   Cardiovascular:  Negative for chest pain, palpitations and leg swelling.  Gastrointestinal:  Negative for abdominal pain, diarrhea and vomiting.  Genitourinary:  Negative for dysuria.  Skin:  Negative for rash.  Neurological:  Positive for light-headedness. Negative for headaches.   Physical Exam Updated Vital Signs BP (!) 145/93   Pulse 73   Temp 98.4 F (36.9 C)   Resp (!) 22   SpO2 100%   Physical Exam Vitals and nursing note  reviewed.  Constitutional:      General: She is not in acute distress.    Appearance: Normal appearance.  HENT:     Head: Normocephalic.     Mouth/Throat:     Mouth: Mucous membranes are moist.  Cardiovascular:     Rate and Rhythm: Normal rate.  Pulmonary:     Effort: Pulmonary effort is normal. Tachypnea present. No respiratory distress.  Chest:  Chest wall: No tenderness.  Abdominal:     Palpations: Abdomen is soft.     Tenderness: There is no abdominal tenderness.  Musculoskeletal:     Right lower leg: No edema.     Left lower leg: No edema.  Skin:    General: Skin is warm.  Neurological:     Mental Status: She is alert and oriented to person, place, and time. Mental status is at baseline.  Psychiatric:        Mood and Affect: Mood normal.    ED Results / Procedures / Treatments   Labs (all labs ordered are listed, but only abnormal results are displayed) Labs Reviewed  CBC - Abnormal; Notable for the following components:      Result Value   WBC 11.3 (*)    Hemoglobin 11.9 (*)    All other components within normal limits  BASIC METABOLIC PANEL  HEPATIC FUNCTION PANEL  TROPONIN I (HIGH SENSITIVITY)    EKG None  Radiology DG Chest 2 View  Result Date: 03/01/2021 CLINICAL DATA:  CHF.  Shortness of breath. EXAM: CHEST - 2 VIEW COMPARISON:  Radiograph 12/14/2020.  CT 10/01/2020 FINDINGS: Unchanged elevation of right hemidiaphragm. Mild multifocal lung scarring, similar to prior. Mild cardiomegaly is unchanged. Stable mediastinal contours. No evidence of pulmonary edema or acute airspace disease. No pleural fluid or pneumothorax. Thoracic spine without acute osseous abnormalities. IMPRESSION: 1. No acute findings or significant change from prior exam. 2. Stable mild cardiomegaly and multifocal lung scarring. Electronically Signed   By: Keith Rake M.D.   On: 03/01/2021 15:33    Procedures Procedures   Medications Ordered in ED Medications - No data to  display  ED Course  I have reviewed the triage vital signs and the nursing notes.  Pertinent labs & imaging results that were available during my care of the patient were reviewed by me and considered in my medical decision making (see chart for details).    MDM Rules/Calculators/A&P                           65 year old female presents the emergency department with ongoing shortness of breath.  Patient has had chronic lung scarring since suffering from COVID about a year ago.  She wears oxygen 24/7.  EKG is unchanged for the patient, blood work is reassuring, troponin is negative, low suspicion for ACS.  Chest x-ray shows chronic findings.  Given the shortness of breath and flank pain a CT PE study was done which shows no pulmonary embolism but identifies the extensive chronic lung scarring.  CT of the abdomen pelvis in regards to the flank pain showed no acute findings, I believe that this is musculoskeletal, reproducible on exam.  Patient will be treated symptomatically.  On her baseline oxygen requirement she has normal oxygenation and does not appear acutely dyspneic.  Plan for outpatient follow-up.  Patient at this time appears safe and stable for discharge and will be treated as an outpatient.  Discharge plan and strict return to ED precautions discussed, patient verbalizes understanding and agreement.  Final Clinical Impression(s) / ED Diagnoses Final diagnoses:  None    Rx / DC Orders ED Discharge Orders     None        Lorelle Gibbs, DO 03/01/21 2252

## 2021-03-01 NOTE — ED Notes (Signed)
Pt. seen in lobby sitting in W/C with X portable Oxygen tanks, this RT obtained MCGSO tank and assessed then moved to Triage 1.

## 2021-03-11 MED ORDER — AZATHIOPRINE 100 MG PO TABS: 100.0000 mg | ORAL_TABLET | Freq: Every day | ORAL | 5 refills | Status: DC

## 2021-03-11 MED ORDER — AZITHROMYCIN 250 MG PO TABS: ORAL_TABLET | ORAL | 0 refills | Status: DC

## 2021-03-11 NOTE — Telephone Encounter (Signed)
Called and spoke with Patient. Dr. Matilde Bash recommendations given. Understanding stated.  Patient stated she is having increased non productive cough.  Patient stated she is using her inhaler and nebs, but feels she may be getting a respiratory infection and requested a antibiotic. Per Dr. Vaughan Browner- send patient a z pack in to her local pharmacy. Called and spoke with Patient.  Dr. Matilde Bash recommendations given.  Understanding stated.  Prescriptions sent to requested [facility].  Nothing further at this time.

## 2021-03-11 NOTE — Telephone Encounter (Signed)
Please let her know that her labs mildly elevated and not worrisome Her blood counts are also normal from last week  Increase azathioprine dose to 100 mg a day. I will reevaluate at time of return visit

## 2021-03-21 ENCOUNTER — Telehealth: Payer: Self-pay | Admitting: Pulmonary Disease

## 2021-03-21 NOTE — Telephone Encounter (Signed)
Called and spoke with Patient.  Patient stated she called [facility] to get Prednisone refill and was told she had know refills left. Prednisone 69m prescription was sent to requested [facility] 02/08/21, #30, with 5 refills. Called [facility] and spoke with Pharmacist.  Patient had a Prednisone 277mand a Prednisone 1031mrescription.  Prednisone 73m47md no refills.  Patient currently taking Prednisone 20mg69mly per last AVS.  Pharmacist stated she would remove Prednisone 73mg 21mrefill Prednisone 20mg. 3med and spoke with Patient.  Patient made aware Prednisone is being refilled and she has refills available.  Understanding stated.  Nothing further at this time.

## 2021-04-06 ENCOUNTER — Telehealth: Payer: Self-pay | Admitting: Pulmonary Disease

## 2021-04-06 ENCOUNTER — Ambulatory Visit: Payer: 59

## 2021-04-06 NOTE — Telephone Encounter (Signed)
HRCT cancelled for September.  Called and spoke with Patient. Understanding stated.  Nothing further at this time.

## 2021-04-06 NOTE — Telephone Encounter (Signed)
PM put in order on 6/28 for chest CT to be done in 3 months.  Pt has follow up appt with PM on 9/26.  I scheduled CT for 9/21.  Called pt to give her appt info & she states she went to ER in July and they did chest CT then.  I looked at she had CT angio on 7/19.  I told her I would make PM aware and we can cancel this CT if not needed.  She stated ok and she wants PM to look at July CT and make sure everything looks ok.  Does pt need to keep CT appt for 9/21?

## 2021-04-06 NOTE — Telephone Encounter (Signed)
I have reviewed her CT scan from 7/19 which shows stable opacities.  We can cancel the upcoming CT scan in September Will discuss in detail at time of return clinic visit

## 2021-04-06 NOTE — Telephone Encounter (Signed)
Message received from [facility].  Dr. Vaughan Browner, please advise on CT

## 2021-04-27 ENCOUNTER — Telehealth: Payer: Self-pay | Admitting: Pulmonary Disease

## 2021-04-28 NOTE — Telephone Encounter (Signed)
I called and spoke with patient regarding questions on Imuran and if she should still take it. Looks like last note, PM wanted her to take it until visit on 05/09/21. Patient stated pharmacy "did not have any" so I informed patient to call back to see if they can send it to another pharmacy who may have it in stock. We did send in a script on 03/11/21 with 5 refills. Patient verbalized understanding, nothing further needed.

## 2021-05-02 ENCOUNTER — Telehealth: Payer: Self-pay | Admitting: Pulmonary Disease

## 2021-05-02 NOTE — Telephone Encounter (Signed)
I have called and LM on VM for the pt to call back. She will need a copy of her drug formulary to see what is covered by her insurance.

## 2021-05-03 ENCOUNTER — Other Ambulatory Visit: Payer: Self-pay | Admitting: Pulmonary Disease

## 2021-05-03 MED ORDER — PREDNISONE 20 MG PO TABS: ORAL_TABLET | ORAL | 0 refills | Status: DC

## 2021-05-03 MED ORDER — DOXYCYCLINE HYCLATE 100 MG PO TABS: 100.0000 mg | ORAL_TABLET | Freq: Two times a day (BID) | ORAL | 0 refills | Status: DC

## 2021-05-03 NOTE — Telephone Encounter (Signed)
Is she still on prednisone? History feeling acutely ill or just chest discomfort and getting winded easily?  May need to increase prednisone for 3 to 5 days Course of antibiotics if suspicion for an infection  If not better in a couple of days then an urgent visit to the office may be best

## 2021-05-03 NOTE — Telephone Encounter (Signed)
Spoke with pt who states that she has not been feeling well over the last couple day. Pt states she has been feeling more SOB and gets winded easy when speaking. Pt states lung pain with deep breaths in that comes and goes. Pt denies fever or any GI issues. Pt is currently on 3L of O2 cont with sats of 97%. Dr. Ander Slade could you please advise r/t Dr. Vaughan Browner being unavailable.   Pt will provide formulary for medications coverage as well.

## 2021-05-03 NOTE — Telephone Encounter (Signed)
Doxycycline called into pharmacy  Encourage patient to increase prednisone to 40 mg for about 3 to 4 days

## 2021-05-03 NOTE — Telephone Encounter (Signed)
Called and spoke with pt and she stated that she is taking the prednisone 20 mg daily.  She stated that when she got the pain in her lung the prednisone felt like it was not helping her.  She is willing to increase the prednisone for a couple of days to see if this helps and would like to have abx called in to see if this will clear it up.  Pt stated that she has a pending appt on 09/26 that she will keep.   AO please advise. thanks

## 2021-05-03 NOTE — Telephone Encounter (Signed)
Spoke with the Tiffany Coleman and notified of response per Dr Ander Slade She verbalized understanding and I sent her more pred in so she does not run out

## 2021-05-03 NOTE — Progress Notes (Unsigned)
Doxycycline called into pharmacy  Encourage patient to increase prednisone to 40 mg daily for about 3-4 days

## 2021-05-04 ENCOUNTER — Other Ambulatory Visit: Payer: 59

## 2021-05-06 ENCOUNTER — Other Ambulatory Visit: Payer: Self-pay | Admitting: Pulmonary Disease

## 2021-05-07 LAB — SARS CORONAVIRUS 2 (TAT 6-24 HRS): SARS Coronavirus 2: NEGATIVE

## 2021-05-09 ENCOUNTER — Encounter: Payer: Self-pay | Admitting: *Deleted

## 2021-05-09 ENCOUNTER — Encounter: Payer: Self-pay | Admitting: Pulmonary Disease

## 2021-05-09 ENCOUNTER — Other Ambulatory Visit: Payer: Self-pay

## 2021-05-09 ENCOUNTER — Ambulatory Visit (INDEPENDENT_AMBULATORY_CARE_PROVIDER_SITE_OTHER): Payer: 59 | Admitting: Pulmonary Disease

## 2021-05-09 ENCOUNTER — Ambulatory Visit (HOSPITAL_COMMUNITY)
Admission: RE | Admit: 2021-05-09 | Discharge: 2021-05-09 | Disposition: A | Discharge status: Home / Self Care | Payer: 59 | Source: Ambulatory Visit | Attending: Cardiology | Admitting: Cardiology

## 2021-05-09 ENCOUNTER — Encounter (HOSPITAL_COMMUNITY): Payer: Self-pay | Admitting: Cardiology

## 2021-05-09 VITALS — BP 130/70 | HR 82 | Wt 247.2 lb

## 2021-05-09 VITALS — BP 132/68 | HR 103 | Ht 64.0 in | Wt 246.4 lb

## 2021-05-09 DIAGNOSIS — I5032 Chronic diastolic (congestive) heart failure: Secondary | ICD-10-CM | POA: Insufficient documentation

## 2021-05-09 DIAGNOSIS — J9611 Chronic respiratory failure with hypoxia: Secondary | ICD-10-CM | POA: Insufficient documentation

## 2021-05-09 DIAGNOSIS — Z87891 Personal history of nicotine dependence: Secondary | ICD-10-CM | POA: Insufficient documentation

## 2021-05-09 DIAGNOSIS — J849 Interstitial pulmonary disease, unspecified: Secondary | ICD-10-CM | POA: Diagnosis not present

## 2021-05-09 DIAGNOSIS — U099 Post covid-19 condition, unspecified: Secondary | ICD-10-CM

## 2021-05-09 DIAGNOSIS — Z8616 Personal history of COVID-19: Secondary | ICD-10-CM | POA: Diagnosis not present

## 2021-05-09 DIAGNOSIS — Z79899 Other long term (current) drug therapy: Secondary | ICD-10-CM | POA: Insufficient documentation

## 2021-05-09 DIAGNOSIS — I5030 Unspecified diastolic (congestive) heart failure: Secondary | ICD-10-CM

## 2021-05-09 DIAGNOSIS — I5022 Chronic systolic (congestive) heart failure: Secondary | ICD-10-CM

## 2021-05-09 DIAGNOSIS — J45909 Unspecified asthma, uncomplicated: Secondary | ICD-10-CM | POA: Diagnosis not present

## 2021-05-09 DIAGNOSIS — Z7952 Long term (current) use of systemic steroids: Secondary | ICD-10-CM | POA: Diagnosis not present

## 2021-05-09 DIAGNOSIS — Z8249 Family history of ischemic heart disease and other diseases of the circulatory system: Secondary | ICD-10-CM | POA: Insufficient documentation

## 2021-05-09 DIAGNOSIS — Z9981 Dependence on supplemental oxygen: Secondary | ICD-10-CM | POA: Insufficient documentation

## 2021-05-09 DIAGNOSIS — G4733 Obstructive sleep apnea (adult) (pediatric): Secondary | ICD-10-CM

## 2021-05-09 NOTE — Patient Instructions (Addendum)
EKG done today.  Labs done today. We will contact you only if your labs are abnormal.  No medication changes were made. Please continue all current medications as prescribed.  We will schedule your follow up appointment after your heart cath.   If you have any questions or concerns before your next appointment please send [place] a message through [place] or call our office at [phone].    TO LEAVE A MESSAGE FOR THE NURSE SELECT OPTION 2, PLEASE LEAVE A MESSAGE INCLUDING: YOUR NAME DATE OF BIRTH CALL BACK NUMBER REASON FOR CALL**this is important as we prioritize the call backs  YOU WILL RECEIVE A CALL BACK THE SAME DAY AS LONG AS YOU CALL BEFORE 4:00 PM   Do the following things EVERYDAY: Weigh yourself in the morning before breakfast. Write it down and keep it in a log. Take your medicines as prescribed Eat low salt foods--Limit salt (sodium) to 2000 mg per day.  Stay as active as you can everyday Limit all fluids for the day to less than 2 liters   At the [facility], you and your health needs are our priority. As part of our continuing mission to provide you with exceptional heart care, we have created designated Provider Care Teams. These Care Teams include your primary Cardiologist (physician) and Advanced Practice Providers (APPs- Physician Assistants and Nurse Practitioners) who all work together to provide you with the care you need, when you need it.   You may see any of the following providers on your designated Care Team at your next follow up: Dr Glori Bickers Dr Haynes Kerns, NP Lyda Jester, [place] Audry Riles, PharmD   Please be sure to bring in all your medications bottles to every appointment.    You are scheduled for a Cardiac Catheterization on Friday, October 7 with Dr. Loralie Champagne.  1. Please arrive at the [facility] (Main Entrance A) at [facility]: [address] [place], [place] [address] at 9:30am   (This  time is two hours before your procedure to ensure your preparation). Free valet parking service is available.   Special note: Every effort is made to have your procedure done on time. Please understand that emergencies sometimes delay scheduled procedures.  2. Diet: Do not eat solid foods after midnight.  The patient may have clear liquids until 5am upon the day of the procedure.  3. Medication instructions in preparation for your procedure:   DO NOT take your lasix the day of your procedure  On the morning of your procedure, take any morning medicines NOT listed above.  You may use sips of water.  4. Plan for one night stay--bring personal belongings. 5. Bring a current list of your medications and current insurance cards. 6. You MUST have a responsible person to drive you home. 7. Someone MUST be with you the first 24 hours after you arrive home or your discharge will be delayed. 8. Please wear clothes that are easy to get on and off and wear slip-on shoes.  Thank you for allowing [place] to care for you!   -- [facility]

## 2021-05-09 NOTE — H&P (View-Only) (Signed)
ADVANCED [facility] CLINIC CONSULT NOTE  Referring Physician:Dr [name] Primary Care: Dr Jeanie Cooks Primary Cardiologist:Dr Aundra Dubin  HPI: Tiffany Coleman is a 65 year old  with a history of asthma, morbid obesity, OSA, HFpEF, and chronic hypoxic respiratory failure on 3 liters [place] referred to the [facility] by Dr Vaughan Browner.   September 2021 had prolonged hospitalization for COVID 19. Readmitted in October 2021 with hypoxic respiratory failure. 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 Unable to taper prednisone below 10 mg due to worsening dyspnea.   She was started on azathioprine in June 2022 to get her off the prednisone. She took it for a month or 2 and has been unable to refill it as it is no longer covered by insurance. Last she put on doxcycline for respiratory infection.   In July she had CTA - 1.No pulmonary embolus. 2. Stable diffuse interstitial and airspace opacities consistent with known COVID-19 scarring/changes.3. Stable borderline enlarged hilar and mediastinal lymph nodes   Unable to participate in pulmonary rehab due to desaturations. SOB with exertion.  Denies PND. + Orthopnea.  Remains on 3 liters [place]. She want to try CPAP. Of note she used CPAP while hospitalized last year but did not want to wear at home.  Appetite ok. No fever or chills. She has not been weighing at home. 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 Taking all medications  SH: Disabled from [place] . Grandson lives with her. Medicaid Lucianne Lei transports . Former smoker.   Cardiac Testing  2021 Echo LVEF 60-65%,  RV normal , moderate LVH  Review of Systems: [y] = yes, _0  = no   General: Weight gain _1 ; Weight loss _2 ; Anorexia _3 ; Fatigue _4 ; Fever _5 ; Chills _6 ; Weakness _7   Cardiac: Chest pain/pressure _8 ; Resting SOB _9 ; Exertional SOB [ Y]; Orthopnea [ Y]; Pedal Edema _10 ; Palpitations _11 ; Syncope _12 ; Presyncope _13 ; Paroxysmal nocturnal dyspnea_14   Pulmonary: Cough [ Y];  Wheezing_15 ; Hemoptysis_16 ; Sputum _17 ; Snoring _18   GI: Vomiting_19 ; Dysphagia_20 ; Melena_21 ; Hematochezia _22 ; Heartburn_23 ; Abdominal pain _24 ; Constipation _25 ; Diarrhea _26 ; BRBPR _27   GU: Hematuria_28 ; Dysuria _29 ; Nocturia_30   Vascular: Pain in legs with walking _31 ; Pain in feet with lying flat _32 ; Non-healing sores _33 ; Stroke _34 ; TIA _35 ; Slurred speech _36 ;  Neuro: Headaches_37 ; Vertigo_38 ; Seizures_39 ; Paresthesias_40 ;Blurred vision _41 ; Diplopia _42 ; Vision changes _43   Ortho/Skin: Arthritis [ Y]; Joint pain [ Y]; Muscle pain _44 ; Joint swelling _45 ; Back Pain _46 ; Rash _47   Psych: Depression_48 ; Anxiety_49   Heme: Bleeding problems _50 ; Clotting disorders _51 ; Anemia _52   Endocrine: Diabetes _53 ; Thyroid dysfunction_54    Past Medical History:  Diagnosis Date   Anemia yrs ago   Arthritis    knees   Bronchitis    hx   CHF (congestive heart failure) (HCC)    Complication of anesthesia    trouble breathing when wake up needs breathing tx when wakes up   Hx of colonic polyps 2015   Obstructive sleep apnea 06/02/2016   No CPAP used   Recurrent ventral hernia 1/61/0960   Umbilical hernia     Current Outpatient Medications  Medication Sig Dispense Refill   acetaminophen (TYLENOL) 325 MG tablet Take  2 tablets (650 mg total) by mouth every 6 (six) hours as needed for mild pain or headache (fever >/= 101).     albuterol (VENTOLIN HFA) 108 (90 Base) MCG/ACT inhaler Inhale 2 puffs into the lungs every 6 (six) hours as needed for wheezing or shortness of breath. 1 each 2   doxycycline (VIBRA-TABS) 100 MG tablet Take 1 tablet (100 mg total) by mouth 2 (two) times daily. 20 tablet 0   fluticasone (FLONASE) 50 MCG/ACT nasal spray Place 2 sprays into both nostrils daily.  2   furosemide (LASIX) 20 MG tablet Take 20 mg by mouth daily.     ipratropium-albuterol (DUONEB) 0.5-2.5 (3) MG/3ML SOLN Take 3 mLs by nebulization every 6 (six) hours as needed. 360 mL 5   pantoprazole (PROTONIX)  40 MG tablet Take 1 tablet (40 mg total) by mouth daily at 6 (six) AM. 30 tablet 2   predniSONE (DELTASONE) 20 MG tablet Take 1 tablet (20 mg total) by mouth daily with breakfast. 30 tablet 5   azathioprine (IMURAN) 100 MG tablet Take 1 tablet (100 mg total) by mouth daily. (Patient not taking: Reported on 05/09/2021) 30 tablet 5   No current facility-administered medications for this encounter.    Allergies  Allergen Reactions   Latex Hives and Itching   Penicillins Hives, Itching and Other (See Comments)    Has patient had a PCN reaction causing immediate rash, facial/tongue/throat swelling, SOB or lightheadedness with hypotension: Yes Has patient had a PCN reaction causing severe rash involving mucus membranes or skin necrosis: Unk Has patient had a PCN reaction that required hospitalization: Unk Has patient had a PCN reaction occurring within the last 10 years: No If all of the above answers are "NO", then may proceed with Cephalosporin use.    Tape Hives, Itching and Other (See Comments)    Coban wrap, in particular   Bactrim [Sulfamethoxazole-Trimethoprim] Rash    Rash and itching   Codeine Itching      Social History   Socioeconomic History   Marital status: Single    Spouse name: Not on file   Number of children: Not on file   Years of education: Not on file   Highest education level: Not on file  Occupational History   Not on file  Tobacco Use   Smoking status: Former    Years: 20.00    Types: Cigarettes    Quit date: 05/26/2020    Years since quitting: 0.9   Smokeless tobacco: Never  Vaping Use   Vaping Use: Never used  Substance and Sexual Activity   Alcohol use: Yes    Comment: Occ   Drug use: No   Sexual activity: Not Currently  Other Topics Concern   Not on file  Social History Narrative   Not on file   Social Determinants of Health   Financial Resource Strain: Not on file  Food Insecurity: Not on file  Transportation Needs: Not on file   Physical Activity: Not on file  Stress: Not on file  Social Connections: Not on file  Intimate Partner Violence: Not on file      Family History  Problem Relation Age of Onset   Deep vein thrombosis Mother    Diabetes Mother    Hypertension Mother    Varicose Veins Mother    Cancer Father        colon   Hypertension Sister    Hyperlipidemia Brother    Hypertension Daughter    Heart attack Son  Cancer Maternal Uncle     Vitals:   05/09/21 1500  BP: 130/70  Pulse: 82  SpO2: 99%  Weight: 112.1 kg (247 lb 3.2 oz)   Wt Readings from Last 3 Encounters:  05/09/21 112.1 kg (247 lb 3.2 oz)  05/09/21 111.8 kg (246 lb 6.4 oz)  02/08/21 110.1 kg (242 lb 12.8 oz)   Reds Clip 29%   PHYSICAL EXAM: General:  Arrived in wheel chair. No respiratory difficulty HEENT: normal Neck: supple. Difficult to assess due to body habitus.  Carotids 2+ bilat; no bruits. No lymphadenopathy or thryomegaly appreciated. Cor: PMI nondisplaced. Regular rate & rhythm. No rubs, gallops or murmurs. Lungs: clear on 3 liters [place].  Abdomen: soft, nontender, nondistended. No hepatosplenomegaly. No bruits or masses. Good bowel sounds. Extremities: no cyanosis, clubbing, rash, edema Neuro: alert & oriented x 3, cranial nerves grossly intact. moves all 4 extremities w/o difficulty. Affect pleasant.  ECG:SR 83 bpm    ASSESSMENT & PLAN: HFpEF Echo 2021 LVEF 60-65% RV normal LVH  Reds Clip 29% . Not suggestive of fluid overload.  Volume status stable. Cut back lasix 20 mg daily   2. COVID Pneumonitis - 02/2021 CT chest , No PE with improvement in groundglass opacities with development of fibrotic changes consistent with evolving post-COVID ILD.  On chronic steroids + doxycycline   3. Chronic Hypoxic Respiratory Failure  On 3 liters chronically.   4. OSA Interested in CPAP I sent message to Dr Kathy Breach, NP 05/09/21  Patient seen with NP, agree with the above note.   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  She is followed by Dr. Vaughan Browner.  She is now on 3 L oxygen chronically as well as chronic steroids (unable to wean off).  Echo in 9/21 showed EF 60-65%, moderate LVH, normal RV.  She is on Lasix 40 mg daily for diastolic CHF.   REDS clip 29%  General: NAD Neck: No JVD, no thyromegaly or thyroid nodule.  Lungs: Mild crackles at bases.  CV: Nondisplaced PMI.  Heart regular S1/S2, no S3/S4, no murmur.  No peripheral edema.  No carotid bruit.  Normal pedal pulses.  Abdomen: Soft, nontender, no hepatosplenomegaly, no distention.  Skin: Intact without lesions or rashes.  Neurologic: Alert and oriented x 3.  Psych: Normal affect. Extremities: No clubbing or cyanosis.  HEENT: Normal.   On exam, patient does not appear volume overloaded.  Her current dose of po Lasix appears adequate based on exam and REDS clip reading. I suspect that her main issue is post-COVID scarring with development of ILD.  There is a chance that she has developed pulmonary hypertension and that this contributes to dyspnea.  She may be a candidate for Tyvaso if she has significant pulmonary arterial hypertension though I suspect that this is unlikely.  - Continue current Lasix 40 mg daily.  Check BMET/BNP.  - I will arrange for [facility] to formally assess filling pressures and pulmonary pressure.  We discussed risks/benefits and she agreed to procedure.  - She is willing to wear CPAP at night if needed, will messages Dr. Vaughan Browner about this.   Tiffany Coleman 05/10/2021

## 2021-05-09 NOTE — Progress Notes (Signed)
Tiffany Coleman    [id number]    May 04, 1956  Primary Care Physician:Avbuere, Christean Grief, MD  Referring Physician: Nolene Ebbs, [place] [address],  [place] [address]  Chief complaint: Follow-up for asthma mild, post COVID-49  HPI: 65 year old with history of asthma, morbid obesity, OSA  Recent hospitalization for COVID-19 infection with prolonged hospital stay and discharged on 05/11/20 She was readmitted back to [facility] on 10/13 with hypoxic respiratory failure.  No PE on CTA but imaging shows extensive bilateral infiltrates. 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 She has since resumed it after Education officer, museum noticed the discrepancy and she is now at 20 mg a day. 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 No mold, hot tub, Jacuzzi Smoking history: Never smoker Travel history: Originally from [place]. No significant recent travel Relevant family history: No significant family issue of lung disease.  Interim history: Continues on prednisone at 20 mg. We have made multiple attempts to taper steroids but every time she gets below 10 mg her dyspnea worsens Continues on Lasix for lower extremity edema Scheduled to see Dr. Aundra Dubin today for evaluation of heart failure  She was started on azathioprine in June 2022 to get her off the prednisone. She took it for a month or 2 and has been unable to refill it as it is no longer covered by insurance  Given doxycycline and short burst of prednisone last week   Outpatient Encounter Medications as of 05/09/2021  Medication Sig   acetaminophen (TYLENOL) 325 MG tablet Take 2  tablets (650 mg total) by mouth every 6 (six) hours as needed for mild pain or headache (fever >/= 101).   albuterol (VENTOLIN HFA) 108 (90 Base) MCG/ACT inhaler Inhale 2 puffs into the lungs every 6 (six) hours as needed for wheezing or shortness of breath.   azathioprine (IMURAN) 100 MG tablet Take 1 tablet (100 mg total) by mouth daily.   doxycycline (VIBRA-TABS) 100 MG tablet Take 1 tablet (100 mg total) by mouth 2 (two) times daily.   fluticasone (FLONASE) 50 MCG/ACT nasal spray Place 2 sprays into both nostrils daily.   furosemide (LASIX) 20 MG tablet Take 20 mg by mouth daily.   gabapentin (NEURONTIN) 300 MG capsule Take 300 mg by mouth 3 (three) times daily as needed (nerve pain).    ipratropium-albuterol (DUONEB) 0.5-2.5 (3) MG/3ML SOLN Take 3 mLs by nebulization every 6 (six) hours as needed.   pantoprazole (PROTONIX) 40 MG tablet Take 1 tablet (40 mg total) by mouth daily at 6 (six) AM.   predniSONE (DELTASONE) 20 MG tablet Take 1 tablet (20 mg total) by mouth daily with breakfast.   predniSONE (DELTASONE) 20 MG tablet Take 40 mg for 4 days and then back to 20 mg daily thereafter   [DISCONTINUED] azithromycin (ZITHROMAX) 250 MG tablet Take as directed   No facility-administered encounter medications on file as of 05/09/2021.    Physical Exam: Blood pressure 132/68, pulse (!) 103, height _0  (1.626 m), weight 246 lb 6.4 oz (111.8 kg), SpO2 98 %. Gen:      No acute distress HEENT:  EOMI, sclera  anicteric Neck:     No masses; no thyromegaly Lungs:    Bibasal crackles CV:         Regular rate and rhythm; no murmurs Abd:      + bowel sounds; soft, non-tender; no palpable masses, no distension Ext:    No edema; adequate peripheral perfusion Skin:      Warm and dry; no rash Neuro: alert and oriented x 3 Psych: normal mood and affect   Data Reviewed: Imaging: CTA 05/26/2020-no PE, diffuse bilateral interstitial infiltrates and groundglass opacities.  Overall improved compared to  prior CT. Chest x-ray 07/13/2020 -persistent bilateral pulmonary infiltrates.  I have reviewed the images personally. High-res CT 10/01/2020-improvement in groundglass opacities with development of fibrotic changes consistent with evolving post-COVID ILD.  CTA 03/01/2021-no pulmonary embolism, stable diffuse interstitial opacities. I have reviewed the images personally.  PFTs: 08/31/2020 FVC 1.35 [50%], FEV1 1.28 [69%], F/F 95, TLC 1.99 [40%], DLCO 8.42 [44%] Severe restriction, diffusion defect  Labs: CBC 07/13/2020-WBC 10.8, eos 0.1%, absolute eosinophil count 11 CMP 07/13/2020-normal except for slight increase in bicarb and glucose  CTD serologies 02/08/2021- 1:320, cytoplasmic  Cardiac: Echocardiogram 04/24/2020-LVEF 60 to 65%, moderate concentric LVH, RV systolic size and function are normal.  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  High-res CT reviewed with improvement in groundglass opacities with development of fibrotic changes consistent with evolving post-COVID ILD.  CTD serologies show nonspecific elevation in ANA  Since we are unable to come off prednisone we are attempting azathioprine She has been off this medication for the past 1 month due to insurance coverage.  I will check with our pharmacy team to clarify dose and see if she can get on some patient assistance to get back on azathioprine  Pulmonary rehab on hold due to desats.  We can send a referral in a few months when she is better Continue supplemental oxygen.    Asthma She has history of asthma but no obstruction on PFTs.  She has not tolerated Symbicort due to thrush Continue duo nebs, albuterol as needed  HFpEF Echo reviewed with EF 60-65%, LVH Continue Lasix Cardiology follow-up  OSA Intolerant of CPAP and is not interested in retrying  Plan/Recommendations: Continue prednisone Get pharmacy help on  resuming azathioprine Follow-up in 2 to 3 months  Marshell Garfinkel MD [facility] 05/09/2021, 10:34 AM  CC: Nolene Ebbs, MD

## 2021-05-09 NOTE — Progress Notes (Signed)
ADVANCED HF CLINIC CONSULT NOTE  Referring Physician:Dr [name] Primary Care: Dr Jeanie Cooks Primary Cardiologist:Dr Aundra Dubin  HPI: Tiffany Coleman is a 65 year old  with a history of asthma, morbid obesity, OSA, HFpEF, and chronic hypoxic respiratory failure on 3 liters [place] referred to the [facility] by Dr Vaughan Browner.   September 2021 had prolonged hospitalization for COVID 19. Readmitted in October 2021 with hypoxic respiratory failure. 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 Unable to taper prednisone below 10 mg due to worsening dyspnea.   She was started on azathioprine in June 2022 to get her off the prednisone. She took it for a month or 2 and has been unable to refill it as it is no longer covered by insurance. Last she put on doxcycline for respiratory infection.   In July she had CTA - 1.No pulmonary embolus. 2. Stable diffuse interstitial and airspace opacities consistent with known COVID-19 scarring/changes.3. Stable borderline enlarged hilar and mediastinal lymph nodes   Unable to participate in pulmonary rehab due to desaturations. SOB with exertion.  Denies PND. + Orthopnea.  Remains on 3 liters [place]. She want to try CPAP. Of note she used CPAP while hospitalized last year but did not want to wear at home.  Appetite ok. No fever or chills. She has not been weighing at home. 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 Taking all medications  SH: Disabled from [place] . Grandson lives with her. Medicaid Lucianne Lei transports . Former smoker.   Cardiac Testing  2021 Echo LVEF 60-65%,  RV normal , moderate LVH  Review of Systems: [y] = yes, _0  = no   General: Weight gain _1 ; Weight loss _2 ; Anorexia _3 ; Fatigue _4 ; Fever _5 ; Chills _6 ; Weakness _7   Cardiac: Chest pain/pressure _8 ; Resting SOB _9 ; Exertional SOB [ Y]; Orthopnea [ Y]; Pedal Edema _10 ; Palpitations _11 ; Syncope _12 ; Presyncope _13 ; Paroxysmal nocturnal dyspnea_14   Pulmonary: Cough [ Y];  Wheezing_15 ; Hemoptysis_16 ; Sputum _17 ; Snoring _18   GI: Vomiting_19 ; Dysphagia_20 ; Melena_21 ; Hematochezia _22 ; Heartburn_23 ; Abdominal pain _24 ; Constipation _25 ; Diarrhea _26 ; BRBPR _27   GU: Hematuria_28 ; Dysuria _29 ; Nocturia_30   Vascular: Pain in legs with walking _31 ; Pain in feet with lying flat _32 ; Non-healing sores _33 ; Stroke _34 ; TIA _35 ; Slurred speech _36 ;  Neuro: Headaches_37 ; Vertigo_38 ; Seizures_39 ; Paresthesias_40 ;Blurred vision _41 ; Diplopia _42 ; Vision changes _43   Ortho/Skin: Arthritis [ Y]; Joint pain [ Y]; Muscle pain _44 ; Joint swelling _45 ; Back Pain _46 ; Rash _47   Psych: Depression_48 ; Anxiety_49   Heme: Bleeding problems _50 ; Clotting disorders _51 ; Anemia _52   Endocrine: Diabetes _53 ; Thyroid dysfunction_54    Past Medical History:  Diagnosis Date   Anemia yrs ago   Arthritis    knees   Bronchitis    hx   CHF (congestive heart failure) (HCC)    Complication of anesthesia    trouble breathing when wake up needs breathing tx when wakes up   Hx of colonic polyps 2015   Obstructive sleep apnea 06/02/2016   No CPAP used   Recurrent ventral hernia 1/61/0960   Umbilical hernia     Current Outpatient Medications  Medication Sig Dispense Refill   acetaminophen (TYLENOL) 325 MG tablet Take  2 tablets (650 mg total) by mouth every 6 (six) hours as needed for mild pain or headache (fever >/= 101).     albuterol (VENTOLIN HFA) 108 (90 Base) MCG/ACT inhaler Inhale 2 puffs into the lungs every 6 (six) hours as needed for wheezing or shortness of breath. 1 each 2   doxycycline (VIBRA-TABS) 100 MG tablet Take 1 tablet (100 mg total) by mouth 2 (two) times daily. 20 tablet 0   fluticasone (FLONASE) 50 MCG/ACT nasal spray Place 2 sprays into both nostrils daily.  2   furosemide (LASIX) 20 MG tablet Take 20 mg by mouth daily.     ipratropium-albuterol (DUONEB) 0.5-2.5 (3) MG/3ML SOLN Take 3 mLs by nebulization every 6 (six) hours as needed. 360 mL 5   pantoprazole (PROTONIX)  40 MG tablet Take 1 tablet (40 mg total) by mouth daily at 6 (six) AM. 30 tablet 2   predniSONE (DELTASONE) 20 MG tablet Take 1 tablet (20 mg total) by mouth daily with breakfast. 30 tablet 5   azathioprine (IMURAN) 100 MG tablet Take 1 tablet (100 mg total) by mouth daily. (Patient not taking: Reported on 05/09/2021) 30 tablet 5   No current facility-administered medications for this encounter.    Allergies  Allergen Reactions   Latex Hives and Itching   Penicillins Hives, Itching and Other (See Comments)    Has patient had a PCN reaction causing immediate rash, facial/tongue/throat swelling, SOB or lightheadedness with hypotension: Yes Has patient had a PCN reaction causing severe rash involving mucus membranes or skin necrosis: Unk Has patient had a PCN reaction that required hospitalization: Unk Has patient had a PCN reaction occurring within the last 10 years: No If all of the above answers are "NO", then may proceed with Cephalosporin use.    Tape Hives, Itching and Other (See Comments)    Coban wrap, in particular   Bactrim [Sulfamethoxazole-Trimethoprim] Rash    Rash and itching   Codeine Itching      Social History   Socioeconomic History   Marital status: Single    Spouse name: Not on file   Number of children: Not on file   Years of education: Not on file   Highest education level: Not on file  Occupational History   Not on file  Tobacco Use   Smoking status: Former    Years: 20.00    Types: Cigarettes    Quit date: 05/26/2020    Years since quitting: 0.9   Smokeless tobacco: Never  Vaping Use   Vaping Use: Never used  Substance and Sexual Activity   Alcohol use: Yes    Comment: Occ   Drug use: No   Sexual activity: Not Currently  Other Topics Concern   Not on file  Social History Narrative   Not on file   Social Determinants of Health   Financial Resource Strain: Not on file  Food Insecurity: Not on file  Transportation Needs: Not on file   Physical Activity: Not on file  Stress: Not on file  Social Connections: Not on file  Intimate Partner Violence: Not on file      Family History  Problem Relation Age of Onset   Deep vein thrombosis Mother    Diabetes Mother    Hypertension Mother    Varicose Veins Mother    Cancer Father        colon   Hypertension Sister    Hyperlipidemia Brother    Hypertension Daughter    Heart attack Son  Cancer Maternal Uncle     Vitals:   05/09/21 1500  BP: 130/70  Pulse: 82  SpO2: 99%  Weight: 112.1 kg (247 lb 3.2 oz)   Wt Readings from Last 3 Encounters:  05/09/21 112.1 kg (247 lb 3.2 oz)  05/09/21 111.8 kg (246 lb 6.4 oz)  02/08/21 110.1 kg (242 lb 12.8 oz)   Reds Clip 29%   PHYSICAL EXAM: General:  Arrived in wheel chair. No respiratory difficulty HEENT: normal Neck: supple. Difficult to assess due to body habitus.  Carotids 2+ bilat; no bruits. No lymphadenopathy or thryomegaly appreciated. Cor: PMI nondisplaced. Regular rate & rhythm. No rubs, gallops or murmurs. Lungs: clear on 3 liters [place].  Abdomen: soft, nontender, nondistended. No hepatosplenomegaly. No bruits or masses. Good bowel sounds. Extremities: no cyanosis, clubbing, rash, edema Neuro: alert & oriented x 3, cranial nerves grossly intact. moves all 4 extremities w/o difficulty. Affect pleasant.  ECG:SR 83 bpm    ASSESSMENT & PLAN: HFpEF Echo 2021 LVEF 60-65% RV normal LVH  Reds Clip 29% . Not suggestive of fluid overload.  Volume status stable. Cut back lasix 20 mg daily   2. COVID Pneumonitis - 02/2021 CT chest , No PE with improvement in groundglass opacities with development of fibrotic changes consistent with evolving post-COVID ILD.  On chronic steroids + doxycycline   3. Chronic Hypoxic Respiratory Failure  On 3 liters chronically.   4. OSA Interested in CPAP I sent message to Dr Kathy Breach, NP 05/09/21  Patient seen with NP, agree with the above note.   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  She is followed by Dr. Vaughan Browner.  She is now on 3 L oxygen chronically as well as chronic steroids (unable to wean off).  Echo in 9/21 showed EF 60-65%, moderate LVH, normal RV.  She is on Lasix 40 mg daily for diastolic CHF.   REDS clip 29%  General: NAD Neck: No JVD, no thyromegaly or thyroid nodule.  Lungs: Mild crackles at bases.  CV: Nondisplaced PMI.  Heart regular S1/S2, no S3/S4, no murmur.  No peripheral edema.  No carotid bruit.  Normal pedal pulses.  Abdomen: Soft, nontender, no hepatosplenomegaly, no distention.  Skin: Intact without lesions or rashes.  Neurologic: Alert and oriented x 3.  Psych: Normal affect. Extremities: No clubbing or cyanosis.  HEENT: Normal.   On exam, patient does not appear volume overloaded.  Her current dose of po Lasix appears adequate based on exam and REDS clip reading. I suspect that her main issue is post-COVID scarring with development of ILD.  There is a chance that she has developed pulmonary hypertension and that this contributes to dyspnea.  She may be a candidate for Tyvaso if she has significant pulmonary arterial hypertension though I suspect that this is unlikely.  - Continue current Lasix 40 mg daily.  Check BMET/BNP.  - I will arrange for RHC to formally assess filling pressures and pulmonary pressure.  We discussed risks/benefits and she agreed to procedure.  - She is willing to wear CPAP at night if needed, will messages Dr. Vaughan Browner about this.   Loralie Champagne 05/10/2021

## 2021-05-09 NOTE — Patient Instructions (Signed)
Continue the prednisone at current dose I will check with pharmacy regarding insurance coverage for azathioprine.  We need to resume this medication Follow-up with cardiology  Follow-up in clinic in 1 to 2 months

## 2021-05-10 ENCOUNTER — Telehealth: Payer: Self-pay | Admitting: Pulmonary Disease

## 2021-05-10 DIAGNOSIS — G4733 Obstructive sleep apnea (adult) (pediatric): Secondary | ICD-10-CM

## 2021-05-10 NOTE — Telephone Encounter (Signed)
Patient is returning phone call. Patient phone number is [phone] c and [phone] h.

## 2021-05-10 NOTE — Telephone Encounter (Signed)
ATC Patient.  LM to call back to discuss sleep study.  Patient is on oxygen, so in lab sleep study would be needed.     Per Staff message-  Marshell Garfinkel, MD  Conrad Wolford, NP; Elton Sin, LPN Ok. Thanks  I don't think she has a formal sleep study yet. I will place order for home sleep study first.        Previous Messages   ----- Message -----  From: Conrad [place], NP  Sent: 05/09/2021   3:31 PM EDT  To: Marshell Garfinkel, MD  Subject: CPAP                                           Dr Aundra Dubin wondering if she can get CPAP. She is interested in CPAP.   Thanks,   Amy [employer]

## 2021-05-10 NOTE — Telephone Encounter (Signed)
Called and spoke with Patient.  Patient stated she was told at last hospital admission that she needed a cpap. Patient made aware sleep study needs to be a [facility] sleep lab, because Patient is currently on 3L [place]. Patient agreed to in lab sleep study.   Message routed to Dr. Vaughan Browner

## 2021-05-11 ENCOUNTER — Other Ambulatory Visit (HOSPITAL_COMMUNITY): Payer: Self-pay

## 2021-05-11 ENCOUNTER — Telehealth: Payer: Self-pay | Admitting: Pharmacist

## 2021-05-11 MED ORDER — AZATHIOPRINE 50 MG PO TABS: 100.0000 mg | ORAL_TABLET | Freq: Every day | ORAL | 2 refills | Status: DC

## 2021-05-11 NOTE — Telephone Encounter (Signed)
Prescription for azathioprine 162m (using 571mtabs) [facility] to discuss but unable to reach. Left detailed VM and direct office number if she has questions  DeKnox SalivaPharmD, MPH, BCPS Clinical Pharmacist (Rheumatology and Pulmonology)

## 2021-05-11 NOTE — Progress Notes (Signed)
Porescription for azathioprine 133m (using 583mtab) sent to local [facility]. ATC patient to advise - left detailed VM  DeKnox SalivaPharmD, MPH, BCPS Clinical Pharmacist (Rheumatology and Pulmonology)

## 2021-05-11 NOTE — Telephone Encounter (Signed)
-----  Message from Beatriz Chancellor, CPhT sent at 05/11/2021  1:38 PM EDT ----- The 15m tablet is a plan exclusion, but plan will cover the 575mtablet. Ran test claim for #60/30 day supply- plan paid leaving zero copay.   ----- Message ----- From: MaMarshell GarfinkelMD Sent: 05/09/2021  10:35 AM EDT To: Rx Rheum/Pulm  Can you please check on insurance coverage for azathioprine. We started 100 mg a day in June.  She was told last month that this is no longer covered by her insurance. She needs to resume azathioprine so that we can get her off prednisone  Appreciate your help

## 2021-05-11 NOTE — Telephone Encounter (Signed)
Split night order placed for [facility].  Patient is aware.  Nothing further at this time.

## 2021-05-11 NOTE — Telephone Encounter (Signed)
Ok. Please order an in lab sleep study

## 2021-05-13 NOTE — Telephone Encounter (Signed)
Called patient and advised on new rx sent to [facility] nad reviewed dosing of azathioprine. She expressed gratitude. Nothing further needed  Knox Saliva, PharmD, MPH, BCPS Clinical Pharmacist (Rheumatology and Pulmonology)

## 2021-05-20 ENCOUNTER — Ambulatory Visit (HOSPITAL_COMMUNITY)
Admission: RE | Admit: 2021-05-20 | Discharge: 2021-05-20 | Disposition: A | Discharge status: Home / Self Care | Payer: 59 | Attending: Cardiology | Admitting: Cardiology

## 2021-05-20 ENCOUNTER — Encounter (HOSPITAL_COMMUNITY): Payer: Self-pay | Admitting: Cardiology

## 2021-05-20 ENCOUNTER — Encounter (HOSPITAL_COMMUNITY)
Admission: RE | Disposition: A | Discharge status: Home / Self Care | Payer: Self-pay | Source: Home / Self Care | Attending: Cardiology

## 2021-05-20 ENCOUNTER — Other Ambulatory Visit: Payer: Self-pay

## 2021-05-20 DIAGNOSIS — Z79899 Other long term (current) drug therapy: Secondary | ICD-10-CM | POA: Insufficient documentation

## 2021-05-20 DIAGNOSIS — Z6841 Body Mass Index (BMI) 40.0 and over, adult: Secondary | ICD-10-CM | POA: Insufficient documentation

## 2021-05-20 DIAGNOSIS — J849 Interstitial pulmonary disease, unspecified: Secondary | ICD-10-CM | POA: Diagnosis not present

## 2021-05-20 DIAGNOSIS — I5032 Chronic diastolic (congestive) heart failure: Secondary | ICD-10-CM | POA: Insufficient documentation

## 2021-05-20 DIAGNOSIS — G4733 Obstructive sleep apnea (adult) (pediatric): Secondary | ICD-10-CM | POA: Insufficient documentation

## 2021-05-20 DIAGNOSIS — Z7952 Long term (current) use of systemic steroids: Secondary | ICD-10-CM | POA: Insufficient documentation

## 2021-05-20 DIAGNOSIS — Z91040 Latex allergy status: Secondary | ICD-10-CM | POA: Diagnosis not present

## 2021-05-20 DIAGNOSIS — I509 Heart failure, unspecified: Secondary | ICD-10-CM | POA: Diagnosis not present

## 2021-05-20 DIAGNOSIS — J9611 Chronic respiratory failure with hypoxia: Secondary | ICD-10-CM | POA: Diagnosis not present

## 2021-05-20 DIAGNOSIS — Z881 Allergy status to other antibiotic agents status: Secondary | ICD-10-CM | POA: Insufficient documentation

## 2021-05-20 DIAGNOSIS — Z880 Allergy status to penicillin: Secondary | ICD-10-CM | POA: Diagnosis not present

## 2021-05-20 DIAGNOSIS — Z87891 Personal history of nicotine dependence: Secondary | ICD-10-CM | POA: Insufficient documentation

## 2021-05-20 DIAGNOSIS — I272 Pulmonary hypertension, unspecified: Secondary | ICD-10-CM | POA: Diagnosis not present

## 2021-05-20 DIAGNOSIS — U099 Post covid-19 condition, unspecified: Secondary | ICD-10-CM | POA: Diagnosis not present

## 2021-05-20 DIAGNOSIS — Z885 Allergy status to narcotic agent status: Secondary | ICD-10-CM | POA: Diagnosis not present

## 2021-05-20 HISTORY — PX: RIGHT HEART CATH: CATH118263

## 2021-05-20 LAB — POCT I-STAT EG7
Acid-Base Excess: 6 mmol/L — ABNORMAL HIGH (ref 0.0–2.0)
Bicarbonate: 32.6 mmol/L — ABNORMAL HIGH (ref 20.0–28.0)
Calcium, Ion: 1.2 mmol/L (ref 1.15–1.40)
HCT: 34 % — ABNORMAL LOW (ref 36.0–46.0)
Hemoglobin: 11.6 g/dL — ABNORMAL LOW (ref 12.0–15.0)
O2 Saturation: 74 %
Potassium: 3.3 mmol/L — ABNORMAL LOW (ref 3.5–5.1)
Sodium: 142 mmol/L (ref 135–145)
TCO2: 34 mmol/L — ABNORMAL HIGH (ref 22–32)
pCO2, Ven: 55.1 mmHg (ref 44.0–60.0)
pH, Ven: 7.38 (ref 7.250–7.430)
pO2, Ven: 41 mmHg (ref 32.0–45.0)

## 2021-05-20 LAB — POCT I-STAT, CHEM 8
BUN: 14 mg/dL (ref 8–23)
Calcium, Ion: 1.22 mmol/L (ref 1.15–1.40)
Chloride: 99 mmol/L (ref 98–111)
Creatinine, Ser: 1 mg/dL (ref 0.44–1.00)
Glucose, Bld: 118 mg/dL — ABNORMAL HIGH (ref 70–99)
HCT: 36 % (ref 36.0–46.0)
Hemoglobin: 12.2 g/dL (ref 12.0–15.0)
Potassium: 3.4 mmol/L — ABNORMAL LOW (ref 3.5–5.1)
Sodium: 142 mmol/L (ref 135–145)
TCO2: 32 mmol/L (ref 22–32)

## 2021-05-20 SURGERY — RIGHT HEART CATH
Anesthesia: LOCAL

## 2021-05-20 MED ORDER — LIDOCAINE HCL (PF) 1 % IJ SOLN
INTRAMUSCULAR | Status: DC | PRN
  Administered 2021-05-20 (×2): 2 mL via INTRADERMAL

## 2021-05-20 MED ORDER — SODIUM CHLORIDE 0.9% FLUSH: 3.0000 mL | Freq: Two times a day (BID) | INTRAVENOUS | Status: DC

## 2021-05-20 MED ORDER — FUROSEMIDE 20 MG PO TABS: ORAL_TABLET | ORAL | 6 refills | Status: DC

## 2021-05-20 MED ORDER — POTASSIUM CHLORIDE CRYS ER 10 MEQ PO TBCR: 10.0000 meq | EXTENDED_RELEASE_TABLET | Freq: Every day | ORAL | 6 refills | Status: DC

## 2021-05-20 MED ORDER — SODIUM CHLORIDE 0.9 % IV SOLN: 250.0000 mL | INTRAVENOUS | Status: DC | PRN

## 2021-05-20 MED ORDER — LIDOCAINE HCL (PF) 1 % IJ SOLN
INTRAMUSCULAR | Status: AC
  Filled 2021-05-20: qty 30

## 2021-05-20 MED ORDER — SODIUM CHLORIDE 0.9 % IV SOLN: INTRAVENOUS | Status: DC

## 2021-05-20 MED ORDER — SODIUM CHLORIDE 0.9% FLUSH: 3.0000 mL | INTRAVENOUS | Status: DC | PRN

## 2021-05-20 MED ORDER — HEPARIN (PORCINE) IN NACL 1000-0.9 UT/500ML-% IV SOLN
INTRAVENOUS | Status: DC | PRN
  Administered 2021-05-20: 500 mL

## 2021-05-20 MED ORDER — HEPARIN (PORCINE) IN NACL 1000-0.9 UT/500ML-% IV SOLN
INTRAVENOUS | Status: AC
  Filled 2021-05-20: qty 500

## 2021-05-20 SURGICAL SUPPLY — 8 items
CATH BALLN WEDGE 5F 110CM (CATHETERS) ×1 IMPLANT
PACK CARDIAC CATHETERIZATION (CUSTOM PROCEDURE TRAY) ×2 IMPLANT
PROTECTION STATION PRESSURIZED (MISCELLANEOUS) ×2
SHEATH GLIDE SLENDER 4/5FR (SHEATH) ×2 IMPLANT
SHEATH PROBE COVER 6X72 (BAG) ×1 IMPLANT
STATION PROTECTION PRESSURIZED (MISCELLANEOUS) IMPLANT
TRANSDUCER W/STOPCOCK (MISCELLANEOUS) ×2 IMPLANT
WIRE MICROINTRODUCER 60CM (WIRE) ×2 IMPLANT

## 2021-05-20 NOTE — Discharge Instructions (Signed)
1. Increase Lasix (furosemide) to 40 mg in the morning and 20 mg in the afternoon.  2. Add KCl (potassium) 10 mEq daily.

## 2021-05-20 NOTE — Interval H&P Note (Signed)
History and Physical Interval Note:  05/20/2021 11:20 AM  Tiffany Coleman  has presented today for surgery, with the diagnosis of congestive heart failure.  The various methods of treatment have been discussed with the patient and family. After consideration of risks, benefits and other options for treatment, the patient has consented to  Procedure(s): RIGHT HEART CATH (N/A) as a surgical intervention.  The patient's history has been reviewed, patient examined, no change in status, stable for surgery.  I have reviewed the patient's chart and labs.  Questions were answered to the patient's satisfaction.     Kassity Woodson [employer]

## 2021-05-23 LAB — POCT I-STAT, CHEM 8
BUN: 20 mg/dL (ref 8–23)
Calcium, Ion: 1.13 mmol/L — ABNORMAL LOW (ref 1.15–1.40)
Chloride: 101 mmol/L (ref 98–111)
Creatinine, Ser: 1 mg/dL (ref 0.44–1.00)
Glucose, Bld: 119 mg/dL — ABNORMAL HIGH (ref 70–99)
HCT: 35 % — ABNORMAL LOW (ref 36.0–46.0)
Hemoglobin: 11.9 g/dL — ABNORMAL LOW (ref 12.0–15.0)
Potassium: 6.7 mmol/L (ref 3.5–5.1)
Sodium: 138 mmol/L (ref 135–145)
TCO2: 33 mmol/L — ABNORMAL HIGH (ref 22–32)

## 2021-05-27 ENCOUNTER — Telehealth (HOSPITAL_COMMUNITY): Payer: Self-pay | Admitting: *Deleted

## 2021-05-27 NOTE — Telephone Encounter (Signed)
Pt stated she's aware of medication changes and lab appt scheduled.

## 2021-05-27 NOTE — Telephone Encounter (Signed)
-----  Message from Larey Dresser, MD sent at 05/20/2021 12:08 PM EDT ----- Increased Lasix to 40 qam/20 qpm and KCl to 10 daily after cath.  She will need a BMET in 1 week.  Make sure she understands the med changes.

## 2021-06-02 ENCOUNTER — Other Ambulatory Visit: Payer: Self-pay

## 2021-06-02 ENCOUNTER — Ambulatory Visit (HOSPITAL_COMMUNITY)
Admission: RE | Admit: 2021-06-02 | Discharge: 2021-06-02 | Disposition: A | Discharge status: Home / Self Care | Payer: 59 | Source: Ambulatory Visit | Attending: Internal Medicine | Admitting: Internal Medicine

## 2021-06-02 DIAGNOSIS — I5022 Chronic systolic (congestive) heart failure: Secondary | ICD-10-CM | POA: Insufficient documentation

## 2021-06-02 LAB — BASIC METABOLIC PANEL
Anion gap: 9 (ref 5–15)
BUN: 15 mg/dL (ref 8–23)
CO2: 32 mmol/L (ref 22–32)
Calcium: 9.3 mg/dL (ref 8.9–10.3)
Chloride: 98 mmol/L (ref 98–111)
Creatinine, Ser: 1.08 mg/dL — ABNORMAL HIGH (ref 0.44–1.00)
GFR, Estimated: 56 mL/min — ABNORMAL LOW (ref >60)
Glucose, Bld: 121 mg/dL — ABNORMAL HIGH (ref 70–99)
Potassium: 3.8 mmol/L (ref 3.5–5.1)
Sodium: 139 mmol/L (ref 135–145)

## 2021-06-10 ENCOUNTER — Encounter (HOSPITAL_BASED_OUTPATIENT_CLINIC_OR_DEPARTMENT_OTHER): Payer: 59 | Admitting: Pulmonary Disease

## 2021-07-26 ENCOUNTER — Other Ambulatory Visit: Payer: Self-pay

## 2021-07-26 ENCOUNTER — Encounter (HOSPITAL_BASED_OUTPATIENT_CLINIC_OR_DEPARTMENT_OTHER): Payer: Self-pay | Admitting: Emergency Medicine

## 2021-07-26 ENCOUNTER — Emergency Department (HOSPITAL_BASED_OUTPATIENT_CLINIC_OR_DEPARTMENT_OTHER): Payer: 59 | Admitting: Radiology

## 2021-07-26 ENCOUNTER — Emergency Department (HOSPITAL_BASED_OUTPATIENT_CLINIC_OR_DEPARTMENT_OTHER)
Admission: EM | Admit: 2021-07-26 | Discharge: 2021-07-26 | Disposition: A | Discharge status: Home / Self Care | Payer: 59 | Attending: Emergency Medicine | Admitting: Emergency Medicine

## 2021-07-26 DIAGNOSIS — I110 Hypertensive heart disease with heart failure: Secondary | ICD-10-CM | POA: Diagnosis not present

## 2021-07-26 DIAGNOSIS — Z7951 Long term (current) use of inhaled steroids: Secondary | ICD-10-CM | POA: Insufficient documentation

## 2021-07-26 DIAGNOSIS — J45909 Unspecified asthma, uncomplicated: Secondary | ICD-10-CM | POA: Insufficient documentation

## 2021-07-26 DIAGNOSIS — Z87891 Personal history of nicotine dependence: Secondary | ICD-10-CM | POA: Diagnosis not present

## 2021-07-26 DIAGNOSIS — Z8616 Personal history of COVID-19: Secondary | ICD-10-CM | POA: Insufficient documentation

## 2021-07-26 DIAGNOSIS — I509 Heart failure, unspecified: Secondary | ICD-10-CM | POA: Insufficient documentation

## 2021-07-26 DIAGNOSIS — M25512 Pain in left shoulder: Secondary | ICD-10-CM | POA: Diagnosis present

## 2021-07-26 DIAGNOSIS — Z79899 Other long term (current) drug therapy: Secondary | ICD-10-CM | POA: Insufficient documentation

## 2021-07-26 DIAGNOSIS — Z91040 Latex allergy status: Secondary | ICD-10-CM | POA: Diagnosis not present

## 2021-07-26 DIAGNOSIS — K219 Gastro-esophageal reflux disease without esophagitis: Secondary | ICD-10-CM | POA: Insufficient documentation

## 2021-07-26 DIAGNOSIS — X500XXA Overexertion from strenuous movement or load, initial encounter: Secondary | ICD-10-CM | POA: Insufficient documentation

## 2021-07-26 NOTE — Discharge Instructions (Addendum)
Imaging of your left shoulder was negative for any fractures or dislocations.  I have given you a shoulder sling for supportive care.  In the interim you may take Tylenol as needed for pain and ice the shoulder for relief.  I recommend that you also rest your shoulder for the next few days.  I have given you a follow-up with orthopedics, please call them in the next few days if you continue to have pain.  Return if development of any new or worsening symptoms

## 2021-07-26 NOTE — ED Notes (Signed)
D/c paperwork reviewed with pt, including sling care, proper application and f/u care. Pt verbalized understanding, wheeled to ED exit by RT to meet ride.

## 2021-07-26 NOTE — ED Triage Notes (Signed)
Reports she fell about a week ago. Has had left shoulder pain for a few days.  Worse today and noticed a large bruise to the upper arm.

## 2021-07-26 NOTE — ED Provider Notes (Signed)
[facility] EMERGENCY DEPT Provider Note   CSN: [id number] Arrival date & time: 07/26/21  1308     History Chief Complaint  Patient presents with   Shoulder Pain    Tiffany Coleman is a 65 y.o. female.  Patient with history of asthma and CHF presents today with complaint of left shoulder pain. She states that her symptoms began with soreness over the past few days with worsening this morning. Does endorse that a week ago she reached for something heavy with an outstretched arm and felt a pop in the shoulder with some pain that she felt like subsided. Nursing note states that she fell, however she now denies any recent falls.  The history is provided by the patient. No language interpreter was used.  Shoulder Pain Associated symptoms: no fever and no neck pain       Past Medical History:  Diagnosis Date   Anemia yrs ago   Arthritis    knees   Bronchitis    hx   CHF (congestive heart failure) (HCC)    Complication of anesthesia    trouble breathing when wake up needs breathing tx when wakes up   Hx of colonic polyps 2015   Obstructive sleep apnea 06/02/2016   No CPAP used   Recurrent ventral hernia 7/67/2094   Umbilical hernia     Patient Active Problem List   Diagnosis Date Noted   Oral candidiasis 09/06/2020   Chronic respiratory failure with hypoxia ([place]) 07/13/2020   Edema 07/13/2020   Pneumonitis: Post Covid pneumonitis 05/28/2020   Acute respiratory failure with hypoxemia ([place]) 05/27/2020   Anemia 05/27/2020   Asthma 05/27/2020   History of COVID-19    Acute respiratory failure with hypoxia ([place]) 05/26/2020   Acute hypoxemic respiratory failure due to COVID-19 ([place]) 04/05/2020   Elevated BP without diagnosis of hypertension 04/25/2019   Post-viral cough syndrome 11/02/2017   Teeth missing 05/30/2017   GERD (gastroesophageal reflux disease) 05/30/2017   Hypertension 03/08/2017   Epigastric pain 03/06/2017   Seasonal allergies 01/19/2017    Morbid obesity ([place]) 01/19/2017   Generalized anxiety disorder 10/05/2016   History of colonic polyps 06/03/2016   Obstructive sleep apnea 06/02/2016   Recurrent ventral hernia 09/08/2013    Past Surgical History:  Procedure Laterality Date   ABDOMINAL HYSTERECTOMY     partial   BREAST SURGERY Bilateral    cyst removed   COLON SURGERY     COLONOSCOPY WITH PROPOFOL N/A 05/29/2017   Procedure: COLONOSCOPY WITH PROPOFOL;  Surgeon: Wilford Corner, MD;  Location: [facility];  Service: Endoscopy;  Laterality: N/A;   FINGER SURGERY     right pinkie x 3   HERNIA REPAIR     KNEE SURGERY Right 2017   PARTIAL COLECTOMY Left ~2004   for obstruction   right foot surgery  yrs ago   RIGHT HEART CATH N/A 05/20/2021   Procedure: RIGHT HEART CATH;  Surgeon: Larey Dresser, MD;  Location: [facility];  Service: Cardiovascular;  Laterality: N/A;   stomach tumur     [id number] benign     OB History     Gravida  7   Para      Term      Preterm      AB  3   Living  3      SAB  2   IAB  1   Ectopic      Multiple      Live Births  4           Family History  Problem Relation Age of Onset   Deep vein thrombosis Mother    Diabetes Mother    Hypertension Mother    Varicose Veins Mother    Cancer Father        colon   Hypertension Sister    Hyperlipidemia Brother    Hypertension Daughter    Heart attack Son    Cancer Maternal Uncle     Social History   Tobacco Use   Smoking status: Former    Years: 20.00    Types: Cigarettes    Quit date: 05/26/2020    Years since quitting: 1.1   Smokeless tobacco: Never  Vaping Use   Vaping Use: Never used  Substance Use Topics   Alcohol use: Yes    Comment: Occ   Drug use: No    Home Medications Prior to Admission medications   Medication Sig Start Date End Date Taking? Authorizing Provider  acetaminophen (TYLENOL) 325 MG tablet Take 2 tablets (650 mg total) by mouth every 6 (six) hours as needed  for mild pain or headache (fever >/= 101). 05/11/20   Arrien, Jimmy Picket, MD  albuterol (2.5 MG/3ML) 0.083% NEBU 3 mL, albuterol (5 MG/ML) 0.5% NEBU 0.5 mL Inhale 5 mg into the lungs once a week.    [provider]  albuterol (VENTOLIN HFA) 108 (90 Base) MCG/ACT inhaler Inhale 2 puffs into the lungs every 6 (six) hours as needed for wheezing or shortness of breath. 09/06/20   Parrett, Fonnie Mu, NP  azaTHIOprine (IMURAN) 50 MG tablet Take 2 tablets (100 mg total) by mouth daily. Patient taking differently: Take 50 mg by mouth 2 (two) times daily. 05/11/21   [name], Hart Robinsons, MD  doxycycline (VIBRA-TABS) 100 MG tablet Take 1 tablet (100 mg total) by mouth 2 (two) times daily. Patient not taking: Reported on 05/16/2021 05/03/21   Laurin Coder, MD  fluticasone (FLONASE) 50 MCG/ACT nasal spray Place 2 sprays into both nostrils daily. Patient taking differently: Place 2 sprays into both nostrils daily as needed for rhinitis. 06/01/20   Eugenie Filler, MD  furosemide (LASIX) 20 MG tablet Take 2 tablets (40 mg total) by mouth every morning AND 1 tablet (20 mg total) every evening. 05/20/21   Larey Dresser, MD  ipratropium-albuterol (DUONEB) 0.5-2.5 (3) MG/3ML SOLN Take 3 mLs by nebulization every 6 (six) hours as needed. Patient not taking: Reported on 05/16/2021 12/14/20   Marshell Garfinkel, MD  pantoprazole (PROTONIX) 40 MG tablet Take 1 tablet (40 mg total) by mouth daily at 6 (six) AM. 06/01/20   Eugenie Filler, MD  potassium chloride (KLOR-CON) 10 MEQ tablet Take 1 tablet (10 mEq total) by mouth daily. 05/20/21   Larey Dresser, MD  predniSONE (DELTASONE) 20 MG tablet Take 1 tablet (20 mg total) by mouth daily with breakfast. 02/08/21   [name], Hart Robinsons, MD  promethazine-dextromethorphan (PROMETHAZINE-DM) 6.25-15 MG/5ML syrup Take 10 mLs by mouth 2 (two) times daily as needed for cough. 05/04/21   [provider]  zolpidem (AMBIEN) 10 MG tablet Take 10 mg by mouth at bedtime as  needed for sleep. 05/15/21   [provider]    Allergies    Latex, Other, and Tape  Review of Systems   Review of Systems  Constitutional:  Negative for chills and fever.  Musculoskeletal:  Positive for arthralgias. Negative for neck pain and neck stiffness.  Skin:  Negative for wound.  All other systems reviewed and are negative.  Physical Exam Updated Vital Signs BP (!) 130/97 (BP Location: Right Arm)    Pulse 98    Temp 98.4 F (36.9 C)    Resp (!) 22    Ht 5' 4" (1.626 m)    Wt 111.6 kg    SpO2 97%    BMI 42.23 kg/m   Physical Exam Vitals and nursing note reviewed.  Constitutional:      Appearance: Normal appearance. She is normal weight.  Cardiovascular:     Rate and Rhythm: Normal rate.     Pulses:          Radial pulses are 2+ on the left side.  Pulmonary:     Effort: Pulmonary effort is normal. No respiratory distress.     Comments: On 3 L O2 per baseline Abdominal:     General: Abdomen is flat.     Palpations: Abdomen is soft.  Musculoskeletal:        General: Normal range of motion.     Cervical back: Normal range of motion. No tenderness.     Comments: Full ROM with 5/5 strength in bilateral upper extremities with some pain.  Tenderness noted to palpation to the humerus head  Skin:    Comments: Hematoma noted to the left anterior shoulder  Neurological:     Mental Status: She is alert.    ED Results / Procedures / Treatments   Labs (all labs ordered are listed, but only abnormal results are displayed) Labs Reviewed - No data to display  EKG None  Radiology DG Shoulder Left  Result Date: 07/26/2021 CLINICAL DATA:  Left shoulder pain and bruising since falling 1 week ago. EXAM: LEFT SHOULDER - 2+ VIEW COMPARISON:  None. FINDINGS: The bones appear mildly demineralized. There is no evidence of acute fracture or dislocation. Mild acromioclavicular and glenohumeral degenerative changes are present. The subacromial space appears adequately  preserved. IMPRESSION: No evidence of acute fracture or dislocation. Mild degenerative changes. Electronically Signed   By: Richardean Sale M.D.   On: 07/26/2021 14:23    Procedures Procedures   Medications Ordered in ED Medications - No data to display  ED Course  I have reviewed the triage vital signs and the nursing notes.  Pertinent labs & imaging results that were available during my care of the patient were reviewed by me and considered in my medical decision making (see chart for details).    MDM Rules/Calculators/A&P                         Patient with left shoulder pain after injury. Full ROM and strength with intact distal pulses and sensation. Patient X-Ray negative for obvious fracture or dislocation. Pain managed in ED. Pt advised to follow up with orthopedics if symptoms persist for possibility of missed fracture diagnosis. Patient given brace while in ED, conservative therapy recommended and discussed. Patient will be dc home & is agreeable with above plan.   This is a shared visit with supervising physician Dr. Ronnald Nian who has independently evaluated patient & provided guidance in evaluation/management/disposition, in agreement with care    Final Clinical Impression(s) / ED Diagnoses Final diagnoses:  Acute pain of left shoulder    Rx / DC Orders ED Discharge Orders     None     An After Visit Summary was printed and given to the patient.    Bud Face, PA-C 07/26/21 1443  Lennice Sites, DO 07/26/21 1451

## 2021-07-26 NOTE — ED Notes (Signed)
Pt. arrived on 3 lpm n/c on own 02 tank, RT placed on MCGSO portable tank, RT to monitor.

## 2021-07-27 ENCOUNTER — Telehealth: Payer: Self-pay | Admitting: Pulmonary Disease

## 2021-07-27 NOTE — Telephone Encounter (Signed)
Error/disregard

## 2021-07-27 NOTE — Telephone Encounter (Signed)
Called patient but she did not answer. Left message for patient to call back.  

## 2021-07-28 ENCOUNTER — Encounter (HOSPITAL_BASED_OUTPATIENT_CLINIC_OR_DEPARTMENT_OTHER): Payer: 59 | Admitting: Pulmonary Disease

## 2021-08-01 NOTE — Telephone Encounter (Signed)
Called and spoke with pt who said that she recently got back to [place]. Said that it is hard to get O2 delivered as she has to go to the DME to have tanks filled up. Said that they do fill up a lot each time but it is hard for her due to transportation issues with this. Stated to pt that it seemed like DME was doing what they should be doing as they are providing extra tanks but said we could see if anything might need to be changed with Rx based off of how many liters she needs. Stated to her that pt is due for an appt and we could see if we might need to change anything with the O2 order at that appt and she verbalized understanding. Due to transportation, pt wanted to wait until next avail with Dr. Vaughan Browner so I have scheduled her an appt with Dr. Vaughan Browner. Nothing further needed.

## 2021-08-08 ENCOUNTER — Other Ambulatory Visit: Payer: Self-pay | Admitting: Pulmonary Disease

## 2021-08-16 ENCOUNTER — Other Ambulatory Visit: Payer: Self-pay | Admitting: Pulmonary Disease

## 2021-08-22 ENCOUNTER — Other Ambulatory Visit: Payer: Self-pay

## 2021-08-22 ENCOUNTER — Encounter: Payer: Self-pay | Admitting: Pulmonary Disease

## 2021-08-22 ENCOUNTER — Ambulatory Visit (INDEPENDENT_AMBULATORY_CARE_PROVIDER_SITE_OTHER): Payer: 59 | Admitting: Pulmonary Disease

## 2021-08-22 VITALS — BP 148/82 | HR 82 | Temp 98.2°F | Ht 64.0 in | Wt 241.0 lb

## 2021-08-22 DIAGNOSIS — Z5181 Encounter for therapeutic drug level monitoring: Secondary | ICD-10-CM

## 2021-08-22 DIAGNOSIS — I5030 Unspecified diastolic (congestive) heart failure: Secondary | ICD-10-CM | POA: Diagnosis not present

## 2021-08-22 DIAGNOSIS — G4733 Obstructive sleep apnea (adult) (pediatric): Secondary | ICD-10-CM | POA: Diagnosis not present

## 2021-08-22 DIAGNOSIS — U099 Post covid-19 condition, unspecified: Secondary | ICD-10-CM

## 2021-08-22 DIAGNOSIS — R0602 Shortness of breath: Secondary | ICD-10-CM | POA: Diagnosis not present

## 2021-08-22 MED ORDER — AZATHIOPRINE 50 MG PO TABS: 150.0000 mg | ORAL_TABLET | Freq: Every day | ORAL | 5 refills | Status: DC

## 2021-08-22 NOTE — Progress Notes (Signed)
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  No PE on CTA but imaging shows extensive bilateral infiltrates. 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 She has since resumed it after Education officer, [employer] noticed the discrepancy and she is now at 20 mg a day. 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 No mold, hot tub, Jacuzzi Smoking history: Never smoker Travel history: Originally from [place]. No significant recent travel Relevant family history: No significant family issue of lung disease.  Interim history: Continues on prednisone at 20 mg.  Started on azathioprine as she was unable to come off steroids in spite of multiple attempts at taper.  She continues on azathioprine 800 mg a day On supplemental oxygen She had a right heart cath by Dr. Aundra Dubin in November 2022 with mild pulmonary venous hypertension.  She is on Lasix for diuresis   States that breathing is stable  Outpatient Encounter Medications as of 08/22/2021  Medication Sig   acetaminophen (TYLENOL) 325 MG tablet Take 2 tablets (650 mg total) by mouth every 6 (six) hours as needed for mild pain or headache (fever >/= 101).   albuterol (2.5  MG/3ML) 0.083% NEBU 3 mL, albuterol (5 MG/ML) 0.5% NEBU 0.5 mL Inhale 5 mg into the lungs once a week.   albuterol (VENTOLIN HFA) 108 (90 Base) MCG/ACT inhaler Inhale 2 puffs into the lungs every 6 (six) hours as needed for wheezing or shortness of breath.   azaTHIOprine (IMURAN) 50 MG tablet Take 2 tablets (100 mg total) by mouth daily. (Patient taking differently: Take 50 mg by mouth 2 (two) times daily.)   fluticasone (FLONASE) 50 MCG/ACT nasal spray Place 2 sprays into both nostrils daily. (Patient taking differently: Place 2 sprays into both nostrils daily as needed for rhinitis.)   furosemide (LASIX) 20 MG tablet Take 2 tablets (40 mg total) by mouth every morning AND 1 tablet (20 mg total) every evening.   ipratropium-albuterol (DUONEB) 0.5-2.5 (3) MG/3ML SOLN Take 3 mLs by nebulization every 6 (six) hours as needed.   MOUNJARO 2.5 MG/0.5ML Pen SMARTSIG:2.5 Milligram(s) SUB-Q Once a Week   pantoprazole (PROTONIX) 40 MG tablet Take 1 tablet (40 mg total) by mouth daily at 6 (six) AM.   potassium chloride (KLOR-CON) 10 MEQ tablet Take 1 tablet (10 mEq total) by mouth daily.   predniSONE (DELTASONE) 20 MG tablet TAKE 1 TABLET(20 MG) BY MOUTH DAILY WITH BREAKFAST   promethazine-dextromethorphan (PROMETHAZINE-DM) 6.25-15 MG/5ML syrup Take 10 mLs by mouth 2 (two) times daily as needed for cough.   zolpidem (AMBIEN) 10 MG tablet Take 10 mg by mouth at bedtime as needed for sleep.   [DISCONTINUED] doxycycline (VIBRA-TABS) 100 MG tablet Take 1 tablet (100 mg total) by mouth 2 (two)  times daily.   No facility-administered encounter medications on file as of 08/22/2021.    Physical Exam: Blood pressure (!) 148/82, pulse 82, temperature 98.2 F (36.8 C), temperature source Oral, height _0  (1.626 m), weight 241 lb (109.3 kg), SpO2 97 %. Gen:      No acute distress HEENT:  EOMI, sclera anicteric Neck:     No masses; no thyromegaly Lungs:    Clear to auscultation bilaterally; normal respiratory  effort CV:         Regular rate and rhythm; no murmurs Abd:      + bowel sounds; soft, non-tender; no palpable masses, no distension Ext:    No edema; adequate peripheral perfusion Skin:      Warm and dry; no rash Neuro: alert and oriented x 3 Psych: normal mood and affect   Data Reviewed: Imaging: CTA 05/26/2020-no PE, diffuse bilateral interstitial infiltrates and groundglass opacities.  Overall improved compared to prior CT. Chest x-ray 07/13/2020 -persistent bilateral pulmonary infiltrates.  I have reviewed the images personally. High-res CT 10/01/2020-improvement in groundglass opacities with development of fibrotic changes consistent with evolving post-COVID ILD.  CTA 03/01/2021-no pulmonary embolism, stable diffuse interstitial opacities. I have reviewed the images personally.  PFTs: 08/31/2020 FVC 1.35 [50%], FEV1 1.28 [69%], F/F 95, TLC 1.99 [40%], DLCO 8.42 [44%] Severe restriction, diffusion defect  Labs: CBC 07/13/2020-WBC 10.8, eos 0.1%, absolute eosinophil count 11 CMP 07/13/2020-normal except for slight increase in bicarb and glucose  CTD serologies 02/08/2021- 1:320, cytoplasmic  Cardiac: Echocardiogram 04/24/2020-LVEF 60 to 65%, moderate concentric LVH, RV systolic size and function are normal.  RHC 05/20/2021 1. Mildly elevated right-sided filling pressures.  2. Mild pulmonary venous hypertension.  3. Normal PCWP.   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  High-res CT reviewed with improvement in groundglass opacities with development of fibrotic changes consistent with evolving post-COVID ILD.  CTD serologies show nonspecific elevation in ANA  Since we are unable to come off prednisone we are attempting azathioprine Increase azathioprine to 150 mg a day and start slow steroid taper Pulmonary rehab on hold due to desats.  We can send a referral in a few months  when she is better Continue supplemental oxygen.    Check CBC and CMP for monitoring  Asthma She has history of asthma but no obstruction on PFTs.  She has not tolerated Symbicort due to thrush Continue duo nebs, albuterol as needed  HFpEF Echo reviewed with EF 60-65%, LVH Right heart cath with no evidence of pulmonary arterial hypertension Continue Lasix Cardiology follow-up  OSA Intolerant of CPAP and is not interested in retrying Split-night sleep study ordered last year but patient canceled as she just had her hair done and did not want to disturb it  Plan/Recommendations: Increase azathioprine to 150 mg Wean prednisone to 10 mg for 1 month and then 5 mg for 1 month and then stop Continue supplemental oxygen High-res CT and PFTs in 3 months  Marshell Garfinkel MD  Pulmonary and Critical Care 08/22/2021, 11:17 AM  CC: Nolene Ebbs, MD

## 2021-08-22 NOTE — Patient Instructions (Signed)
We will increase the azathioprine to 150 mg a day Start reduce prednisone to 10 mg a day start tapering prednisone.  Will reduce prednisone to 10 mg a day for 1 month and then 5 mg a day for 1 month and then stop  Check CBC, CMP.  He can come back next week to get the labs drawn We will work on getting new oxygen supplies  Order high-res CT and PFTs in 3 months and follow-up in clinic

## 2021-09-06 ENCOUNTER — Ambulatory Visit (INDEPENDENT_AMBULATORY_CARE_PROVIDER_SITE_OTHER): Payer: 59

## 2021-09-06 ENCOUNTER — Ambulatory Visit (INDEPENDENT_AMBULATORY_CARE_PROVIDER_SITE_OTHER): Payer: 59 | Admitting: Primary Care

## 2021-09-06 ENCOUNTER — Encounter: Payer: Self-pay | Admitting: Primary Care

## 2021-09-06 ENCOUNTER — Other Ambulatory Visit: Payer: Self-pay

## 2021-09-06 ENCOUNTER — Encounter (HOSPITAL_BASED_OUTPATIENT_CLINIC_OR_DEPARTMENT_OTHER): Payer: 59 | Admitting: Pulmonary Disease

## 2021-09-06 DIAGNOSIS — Z5181 Encounter for therapeutic drug level monitoring: Secondary | ICD-10-CM

## 2021-09-06 DIAGNOSIS — R42 Dizziness and giddiness: Secondary | ICD-10-CM | POA: Diagnosis not present

## 2021-09-06 DIAGNOSIS — J189 Pneumonia, unspecified organism: Secondary | ICD-10-CM

## 2021-09-06 DIAGNOSIS — R0602 Shortness of breath: Secondary | ICD-10-CM

## 2021-09-06 DIAGNOSIS — U099 Post covid-19 condition, unspecified: Secondary | ICD-10-CM | POA: Diagnosis not present

## 2021-09-06 DIAGNOSIS — R609 Edema, unspecified: Secondary | ICD-10-CM

## 2021-09-06 LAB — COMPREHENSIVE METABOLIC PANEL
ALT: 14 U/L (ref 0–35)
AST: 14 U/L (ref 0–37)
Albumin: 3.9 g/dL (ref 3.5–5.2)
Alkaline Phosphatase: 50 U/L (ref 39–117)
BUN: 11 mg/dL (ref 6–23)
CO2: 33 mEq/L — ABNORMAL HIGH (ref 19–32)
Calcium: 9.8 mg/dL (ref 8.4–10.5)
Chloride: 100 mEq/L (ref 96–112)
Creatinine, Ser: 0.95 mg/dL (ref 0.40–1.20)
GFR: 61.89 mL/min (ref >60.00)
Glucose, Bld: 97 mg/dL (ref 70–99)
Potassium: 4.2 mEq/L (ref 3.5–5.1)
Sodium: 139 mEq/L (ref 135–145)
Total Bilirubin: 0.6 mg/dL (ref 0.2–1.2)
Total Protein: 6.9 g/dL (ref 6.0–8.3)

## 2021-09-06 LAB — CBC WITH DIFFERENTIAL/PLATELET
Basophils Absolute: 0 10*3/uL (ref 0.0–0.1)
Basophils Relative: 0.3 % (ref 0.0–3.0)
Eosinophils Absolute: 0.1 10*3/uL (ref 0.0–0.7)
Eosinophils Relative: 0.9 % (ref 0.0–5.0)
HCT: 37 % (ref 36.0–46.0)
Hemoglobin: 11.7 g/dL — ABNORMAL LOW (ref 12.0–15.0)
Lymphocytes Relative: 7.8 % — ABNORMAL LOW (ref 12.0–46.0)
Lymphs Abs: 0.7 10*3/uL (ref 0.7–4.0)
MCHC: 31.6 g/dL (ref 30.0–36.0)
MCV: 87.4 fl (ref 78.0–100.0)
Monocytes Absolute: 0.4 10*3/uL (ref 0.1–1.0)
Monocytes Relative: 4.8 % (ref 3.0–12.0)
Neutro Abs: 7.3 10*3/uL (ref 1.4–7.7)
Neutrophils Relative %: 86.2 % — ABNORMAL HIGH (ref 43.0–77.0)
Platelets: 245 10*3/uL (ref 150.0–400.0)
RBC: 4.23 Mil/uL (ref 3.87–5.11)
RDW: 15.7 % — ABNORMAL HIGH (ref 11.5–15.5)
WBC: 8.5 10*3/uL (ref 4.0–10.5)

## 2021-09-06 NOTE — Progress Notes (Signed)
_0  ID: Tiffany Coleman, female    DOB: November 22, 1955, 66 y.o.   MRN: [id number]  Chief Complaint  Patient presents with   Follow-up    Patient says she's been dizzy the past 4 days and her feet and hands and face keep swelling with fluid.     Referring provider: Nolene Ebbs, MD  HPI: 66 year old female, former smoker. PMH significant for post covid pneumonitis, OSA, asthma, chronic respiratory failure, HTN, GERD. Patient of Dr. Vaughan Browner, last seen on 08/22/21.   Previous [facility] pulmonary encounter: 66 year old with history of asthma, morbid obesity, OSA   Recent hospitalization for COVID-19 infection with prolonged hospital stay and discharged on 05/11/20 She was readmitted back to [facility] long hospital on 10/13 with hypoxic respiratory failure.  No PE on CTA but imaging shows extensive bilateral infiltrates. PCCM consulted and started on steroids for post Covid pneumonitis Discharged to rehab and now at home   She did not refill her prednisone over the past weekend and noted increased dyspnea. She has since resumed it after Education officer, [employer] noticed the discrepancy and she is now at 20 mg a day. 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 No mold, hot tub, Jacuzzi Smoking history: Never smoker Travel history: Originally from [place]. No significant recent travel Relevant family history: No significant family issue of lung disease.   08/22/21 Interim history: Continues on prednisone at 20 mg.  Started on azathioprine as she was unable to come off steroids in spite of multiple attempts at taper.  She continues on azathioprine 800 mg a day On supplemental oxygen She had a right heart cath by Dr. Aundra Dubin in November 2022 with mild pulmonary venous hypertension.  She is on Lasix for diuresis    States  that breathing is stable  09/06/2021 Patient presents today for acute OV. Patient reports dizziness x 4 days and fluid rention in her hands and feet. Hx asthma without obstruction on PFTs. She has not been able to tolerate Symbicort d/t thrush. Maintained on duonebs and albuterol as needed. Responsive to steroids. Maintained on chronic prednisone 48m daily. She has been unable to taper off. Started on Azathioprine with slow prednisone taper. HRCT imaging showed improvement in ground glass opacities with development of fibrotic changed consistent with evolving post covid ILD. CTD serologies showed nonspecific elevated in ANA. She had a heart cath in November 2022 showed mild pulmonary venous hypertension, no evidence of pulmonary arterial hypertension. On supplemental oxygen. She is on lasix for diuresis.   BP 140/84 right arm sitting; 140/90 standing right arm   Data Reviewed: Imaging: CTA 05/26/2020-no PE, diffuse bilateral interstitial infiltrates and groundglass opacities.  Overall improved compared to prior CT. Chest x-ray 07/13/2020 -persistent bilateral pulmonary infiltrates.  I have reviewed the images personally. High-res CT 10/01/2020-improvement in groundglass opacities with development of fibrotic changes consistent with evolving post-COVID ILD.  CTA 03/01/2021-no pulmonary embolism, stable diffuse interstitial opacities. I have reviewed the images personally.   PFTs: 08/31/2020 FVC 1.35 [50%], FEV1 1.28 [69%], F/F 95, TLC 1.99 [40%], DLCO 8.42 [44%] Severe restriction, diffusion defect   Labs: CBC 07/13/2020-WBC 10.8, eos 0.1%, absolute eosinophil count 11 CMP 07/13/2020-normal except for slight increase in bicarb and glucose   CTD serologies 02/08/2021- 1:320, cytoplasmic   Cardiac: Echocardiogram 04/24/2020-LVEF 60 to 65%, moderate concentric  LVH, RV systolic size and function are normal.   RHC 05/20/2021 1. Mildly elevated right-sided filling pressures.  2. Mild pulmonary  venous hypertension.  3. Normal PCWP.     Allergies  Allergen Reactions   Latex Hives and Itching    Gloves with power   Other Itching    When take blood the material they put around arm causes itching   Tape Hives, Itching and Other (See Comments)    Coban wrap, in particular     There is no immunization history on file for this patient.  Past Medical History:  Diagnosis Date   Anemia yrs ago   Arthritis    knees   Bronchitis    hx   CHF (congestive heart failure) (HCC)    Complication of anesthesia    trouble breathing when wake up needs breathing tx when wakes up   Hx of colonic polyps 2015   Obstructive sleep apnea 06/02/2016   No CPAP used   Recurrent ventral hernia 7/68/0881   Umbilical hernia     Tobacco History: Social History   Tobacco Use  Smoking Status Former   Years: 20.00   Types: Cigarettes   Quit date: 05/26/2020   Years since quitting: 1.2  Smokeless Tobacco Never   Counseling given: Not Answered   Outpatient Medications Prior to Visit  Medication Sig Dispense Refill   acetaminophen (TYLENOL) 325 MG tablet Take 2 tablets (650 mg total) by mouth every 6 (six) hours as needed for mild pain or headache (fever >/= 101).     albuterol (2.5 MG/3ML) 0.083% NEBU 3 mL, albuterol (5 MG/ML) 0.5% NEBU 0.5 mL Inhale 5 mg into the lungs once a week.     albuterol (VENTOLIN HFA) 108 (90 Base) MCG/ACT inhaler Inhale 2 puffs into the lungs every 6 (six) hours as needed for wheezing or shortness of breath. 1 each 2   azaTHIOprine (IMURAN) 50 MG tablet Take 2 tablets (100 mg total) by mouth daily. (Patient taking differently: Take 50 mg by mouth 2 (two) times daily.) 60 tablet 2   azaTHIOprine (IMURAN) 50 MG tablet Take 3 tablets (150 mg total) by mouth daily. Take 146m daily 90 tablet 5   fluticasone (FLONASE) 50 MCG/ACT nasal spray Place 2 sprays into both nostrils daily. (Patient taking differently: Place 2 sprays into both nostrils daily as needed for  rhinitis.)  2   furosemide (LASIX) 20 MG tablet Take 2 tablets (40 mg total) by mouth every morning AND 1 tablet (20 mg total) every evening. 90 tablet 6   ipratropium-albuterol (DUONEB) 0.5-2.5 (3) MG/3ML SOLN Take 3 mLs by nebulization every 6 (six) hours as needed. 360 mL 5   MOUNJARO 2.5 MG/0.5ML Pen SMARTSIG:2.5 Milligram(s) SUB-Q Once a Week     pantoprazole (PROTONIX) 40 MG tablet Take 1 tablet (40 mg total) by mouth daily at 6 (six) AM. 30 tablet 2   potassium chloride (KLOR-CON) 10 MEQ tablet Take 1 tablet (10 mEq total) by mouth daily. 30 tablet 6   predniSONE (DELTASONE) 20 MG tablet TAKE 1 TABLET(20 MG) BY MOUTH DAILY WITH BREAKFAST 30 tablet 2   promethazine-dextromethorphan (PROMETHAZINE-DM) 6.25-15 MG/5ML syrup Take 10 mLs by mouth 2 (two) times daily as needed for cough.     zolpidem (AMBIEN) 10 MG tablet Take 10 mg by mouth at bedtime as needed for sleep.     No facility-administered medications prior to visit.      Review of Systems  Review of Systems  Constitutional:  Positive for fatigue.  Respiratory: Negative.    Cardiovascular:  Positive for leg swelling.  Neurological:  Positive for dizziness.    Physical Exam  BP 134/80 (BP Location: Right Arm, Patient Position: Sitting, Cuff Size: Normal)    Pulse 67    Temp 97.9 F (36.6 C) (Oral)    Ht _0  (1.626 m)    Wt 237 lb (107.5 kg)    SpO2 99%    BMI 40.68 kg/m  Physical Exam Constitutional:      Appearance: Normal appearance.  HENT:     Head: Normocephalic and atraumatic.     Mouth/Throat:     Mouth: Mucous membranes are moist.     Pharynx: Oropharynx is clear.  Cardiovascular:     Comments: Trace swelling legs, hands Pulmonary:     Effort: Pulmonary effort is normal.     Breath sounds: Rales present.  Musculoskeletal:        General: Normal range of motion.  Skin:    General: Skin is warm and dry.  Neurological:     General: No focal deficit present.     Mental Status: She is alert and oriented  to person, place, and time. Mental status is at baseline.  Psychiatric:        Mood and Affect: Mood normal.        Behavior: Behavior normal.        Thought Content: Thought content normal.        Judgment: Judgment normal.     Lab Results:  CBC    Component Value Date/Time   WBC 8.5 09/06/2021 1443   RBC 4.23 09/06/2021 1443   HGB 11.7 (L) 09/06/2021 1443   HGB 12.5 04/25/2019 1131   HCT 37.0 09/06/2021 1443   HCT 37.8 04/25/2019 1131   PLT 245.0 09/06/2021 1443   PLT 219 04/25/2019 1131   MCV 87.4 09/06/2021 1443   MCV 84 04/25/2019 1131   MCH 26.4 03/01/2021 1807   MCHC 31.6 09/06/2021 1443   RDW 15.7 (H) 09/06/2021 1443   RDW 13.2 04/25/2019 1131   LYMPHSABS 0.7 09/06/2021 1443   MONOABS 0.4 09/06/2021 1443   EOSABS 0.1 09/06/2021 1443   BASOSABS 0.0 09/06/2021 1443    BMET    Component Value Date/Time   NA 139 09/06/2021 1443   NA 140 03/06/2017 1658   K 4.2 09/06/2021 1443   CL 100 09/06/2021 1443   CO2 33 (H) 09/06/2021 1443   GLUCOSE 97 09/06/2021 1443   BUN 11 09/06/2021 1443   BUN 10 03/06/2017 1658   CREATININE 0.95 09/06/2021 1443   CALCIUM 9.8 09/06/2021 1443   GFRNONAA 56 (L) 06/02/2021 1037   GFRAA >60 05/09/2020 0434    BNP    Component Value Date/Time   BNP 45.7 05/26/2020 1319    ProBNP    Component Value Date/Time   PROBNP 86 02/08/2021 1018   PROBNP 66.0 07/13/2020 1603    Imaging: DG Chest 2 View  Result Date: 09/07/2021 CLINICAL DATA:  Right-sided chest pain. EXAM: CHEST - 2 VIEW COMPARISON:  CT chest 03/01/2021.  Chest x-ray 03/01/2021. FINDINGS: Bilateral chronic appearing patchy interstitial opacities appear unchanged. There is no superimposed airspace disease, pleural effusion or pneumothorax. Cardiomediastinal silhouette is stable, the heart is enlarged. No acute fractures are seen. IMPRESSION: 1. Stable bilateral chronic appearing interstitial changes. 2. No new focal lung infiltrate. 3. Stable cardiomegaly. Electronically  Signed   By: Ronney Asters M.D.   On: 09/07/2021 22:47  Assessment & Plan:   Dizziness - Vague symptoms of dizziness and not feeling well. Orthos were negative. Gross neuro's intact. This started after she begane Mounjaro for weight loss, recommend she hold dose and contact prescriber. Basic labs revealed normal kidney and liver function. WBC wnl and Hgb stable.   Pneumonitis: Post Covid pneumonitis - Appears stable; No specific pulmonary complaints except for pleuritic pain on her right side. She has chronic fine rales at lung bases. CXR showed stable bilateral chronic appearing interstitial changes. No new focal lung infiltrate. Maintained on 50m daily prednisone and Azathioprine. Follow-up due in April with Dr. MVaughan Browner(recall should already be in)   Edema - Patient reports increased hand swelling, she takes lasix as needed for edema. Kidney function is wnl, she can take additional 220mx 3- 5 days in the afternoon as needed.   40 mins spent on case: > 50% face to face with patient    ElMartyn EhrichNP 09/12/2021

## 2021-09-06 NOTE — Patient Instructions (Addendum)
Recommendations: - Waiting to see how your labs look, if kidney function ok we will likely have you increase lasix  - I think Darcel Bayley is causing you to feel bad, hold dose and contact prescriber   Orders: - CXR today re: right side pleuritic pain   Follow-up: - April with Dr. Vaughan Browner (recall should already be in)

## 2021-09-08 ENCOUNTER — Telehealth: Payer: Self-pay | Admitting: Primary Care

## 2021-09-08 DIAGNOSIS — J9611 Chronic respiratory failure with hypoxia: Secondary | ICD-10-CM

## 2021-09-08 NOTE — Progress Notes (Signed)
Please let patient know CXR showed no acute findings, stable chronic interstitial changes which we know about. Stable cardiomegaly (enlarged heart).

## 2021-09-08 NOTE — Telephone Encounter (Signed)
Called and spoke with patient.  Patient requested cxr results.  CXR given. Understanding stated.  Martyn Ehrich, NP  09/08/2021  9:44 AM EST     Please let patient know CXR showed no acute findings, stable chronic interstitial changes which we know about. Stable cardiomegaly (enlarged heart).    Patient stated she spoke with Medicaid yesterday and was told Medicaid would cover POC. Patient requested POC order be placed for Medicaid. New POC order placed per patient request.  Nothing further at this time.

## 2021-09-09 ENCOUNTER — Telehealth: Payer: Self-pay | Admitting: Primary Care

## 2021-09-09 NOTE — Telephone Encounter (Signed)
Spoke with the pt  She is calling about labs  Please advise on lab results, thanks!

## 2021-09-12 DIAGNOSIS — R42 Dizziness and giddiness: Secondary | ICD-10-CM | POA: Insufficient documentation

## 2021-09-12 NOTE — Assessment & Plan Note (Addendum)
-  Vague symptoms of dizziness and not feeling well. Orthos were negative. Gross neuro's intact. This started after she begane Mounjaro for weight loss, recommend she hold dose and contact prescriber. Basic labs revealed normal kidney and liver function. WBC wnl and Hgb stable.

## 2021-09-12 NOTE — Telephone Encounter (Signed)
Her labs were wnl. Wbc was normal. Hgb was baseline. Normal kidney and liver function. Nothing concerning on her lab work

## 2021-09-12 NOTE — Telephone Encounter (Signed)
Patient is aware of results and voiced her understanding.  Nothing further needed.

## 2021-09-12 NOTE — Assessment & Plan Note (Addendum)
-  Appears stable; No specific pulmonary complaints except for pleuritic pain on her right side. She has chronic fine rales at lung bases. CXR showed stable bilateral chronic appearing interstitial changes. No new focal lung infiltrate. Maintained on 62m daily prednisone and Azathioprine. Follow-up due in April with Dr. MVaughan Browner(recall should already be in)

## 2021-09-12 NOTE — Assessment & Plan Note (Signed)
-  Patient reports increased hand swelling, she takes lasix as needed for edema. Kidney function is wnl, she can take additional 69m x 3- 5 days in the afternoon as needed.

## 2021-09-15 ENCOUNTER — Telehealth: Payer: Self-pay | Admitting: Pulmonary Disease

## 2021-09-15 MED ORDER — PREDNISONE 10 MG PO TABS: 10.0000 mg | ORAL_TABLET | Freq: Every day | ORAL | 1 refills | Status: DC

## 2021-09-15 NOTE — Telephone Encounter (Signed)
Called and spoke with patient. She stated that she called [employer] to check on the status of the POC. Per patient, [employer] stated that they never received the order. I advised her that per her chart, we received a confirmation on 09/12/2021 from [place].   She wanted to know if we could fax the order again to [employer].   PCCs, is it possible for you all to re-fax the POC order for this patient? Thank you!

## 2021-09-16 NOTE — Telephone Encounter (Signed)
Tiffany Coleman with [employer] confirmed they did receive this order & is reaching out to the patient.  Nothing further needed at this time.

## 2021-09-16 NOTE — Telephone Encounter (Signed)
Called and spoke with patient and she informed us that she has been denied for a POC due to her being on oxygen for too long and that she would have to switch DME companies if she wanted a POC.   Patient informed us she would be thinking about switching and let us know her answer if she wants to switch companies.  Told her I would let the Doctor know she has been denied and to see what he wanted to do moving forward.

## 2021-09-19 NOTE — Telephone Encounter (Signed)
Not sure why she would get denied for POC if we have qualified her. Can we send it to a different DME company

## 2021-09-20 NOTE — Telephone Encounter (Signed)
Called patient but she did not answer. Left message for patient to call back.  

## 2021-09-23 ENCOUNTER — Ambulatory Visit (HOSPITAL_BASED_OUTPATIENT_CLINIC_OR_DEPARTMENT_OTHER): Payer: 59 | Attending: Pulmonary Disease | Admitting: Pulmonary Disease

## 2021-09-23 ENCOUNTER — Other Ambulatory Visit: Payer: Self-pay

## 2021-09-23 DIAGNOSIS — G4733 Obstructive sleep apnea (adult) (pediatric): Secondary | ICD-10-CM | POA: Diagnosis not present

## 2021-09-23 DIAGNOSIS — Z9981 Dependence on supplemental oxygen: Secondary | ICD-10-CM | POA: Diagnosis not present

## 2021-09-29 DIAGNOSIS — G4733 Obstructive sleep apnea (adult) (pediatric): Secondary | ICD-10-CM | POA: Diagnosis not present

## 2021-09-29 NOTE — Procedures (Signed)
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  She has snoring, sleep disruption, and daytime sleepiness.  Epworth Sleepiness Score: 3  SLEEP STUDY TECHNIQUE As per the AASM Manual for the Scoring of Sleep and Associated Events v2.3 (April 2016) with a hypopnea requiring 4% desaturations.  The channels recorded and monitored were frontal, central and occipital EEG, electrooculogram (EOG), submentalis EMG (chin), nasal and oral airflow, thoracic and abdominal wall motion, anterior tibialis EMG, snore microphone, electrocardiogram, and pulse oximetry.  MEDICATIONS Medications self-administered by patient taken the night of the study : zolpidem  SLEEP ARCHITECTURE The study was initiated at 10:21:05 PM and ended at 4:47:49 AM.  Sleep onset time was 139.9 minutes and the sleep efficiency was 53.0%. The total sleep time was 204.8 minutes.  Stage REM latency was 217.0 minutes.  The patient spent 31.74% of the night in stage N1 sleep, 61.18% in stage N2 sleep, 4.88% in stage N3 and 2.2% in REM.  Alpha intrusion was absent.  Supine sleep was 68.56%.  RESPIRATORY PARAMETERS The overall apnea/hypopnea index (AHI) was 77.9 per hour. There were 95 total apneas, including 95 obstructive, 0 central and 0 mixed apneas. There were 171 hypopneas and 9 RERAs.  The AHI during Stage REM sleep was 106.7 per hour.  AHI while supine was 57.3 per hour.  The mean oxygen saturation was 94.55%. The minimum SpO2 during sleep was 73.00%.  The  study was conducted with her using 3 liters supplemental oxygen.  Loud snoring was noted during this study. The patient opted against trying CPAP during this study.  CARDIAC DATA The 2 lead EKG demonstrated sinus rhythm. The mean heart rate was 81.16 beats per minute. Other EKG findings include: None.  LEG MOVEMENT DATA The total PLMS were 0 with a resulting PLMS index of 0.00. Associated arousal with leg movement index was 0.0 .  IMPRESSIONS - Severe obstructive sleep apnea with an AHI of 77.9 and SpO2 low of 73%.  She wore 3 liters supplemental oxygen during this study. - She opted against trying CPAP during this study. - The patient snored with loud snoring volume.  DIAGNOSIS - Obstructive Sleep Apnea (G47.33) - Nocturnal Hypoxemia (G47.36)  RECOMMENDATIONS - Given the severity of her sleep apnea and oxygen desaturation she should be scheduled for an in lab CPAP titration study, provided she is agreeable to this. - Avoid alcohol, sedatives and other CNS depressants that may worsen sleep apnea and disrupt normal sleep architecture. - Sleep hygiene should be reviewed to assess factors that may improve sleep quality. - Weight management and regular exercise should be initiated or continued if appropriate.  [Electronically signed] 09/29/2021 09:16 AM  Chesley Mires MD, ABSM Diplomate, American Board of Sleep Medicine NPI: [id number]  [place] PH: [phone]   FX: [phone] [place]

## 2021-10-10 ENCOUNTER — Other Ambulatory Visit: Payer: Self-pay | Admitting: Internal Medicine

## 2021-10-11 LAB — LIPID PANEL
Cholesterol: 196 mg/dL (ref <200)
HDL: 69 mg/dL (ref >=50)
LDL Cholesterol (Calc): 110 mg/dL (calc) — ABNORMAL HIGH
Non-HDL Cholesterol (Calc): 127 mg/dL (calc) (ref <130)
Total CHOL/HDL Ratio: 2.8 (calc) (ref <5.0)
Triglycerides: 83 mg/dL (ref <150)

## 2021-10-11 LAB — COMPLETE METABOLIC PANEL WITH GFR
AG Ratio: 1.7 (calc) (ref 1.0–2.5)
ALT: 14 U/L (ref 6–29)
AST: 15 U/L (ref 10–35)
Albumin: 3.9 g/dL (ref 3.6–5.1)
Alkaline phosphatase (APISO): 58 U/L (ref 37–153)
BUN: 14 mg/dL (ref 7–25)
CO2: 31 mmol/L (ref 20–32)
Calcium: 9.8 mg/dL (ref 8.6–10.4)
Chloride: 98 mmol/L (ref 98–110)
Creat: 0.93 mg/dL (ref 0.50–1.05)
Globulin: 2.3 g/dL (calc) (ref 1.9–3.7)
Glucose, Bld: 122 mg/dL — ABNORMAL HIGH (ref 65–99)
Potassium: 4.3 mmol/L (ref 3.5–5.3)
Sodium: 141 mmol/L (ref 135–146)
Total Bilirubin: 0.6 mg/dL (ref 0.2–1.2)
Total Protein: 6.2 g/dL (ref 6.1–8.1)
eGFR: 67 mL/min/{1.73_m2} (ref >=60)

## 2021-10-11 LAB — CBC
HCT: 35.2 % (ref 35.0–45.0)
Hemoglobin: 11.4 g/dL — ABNORMAL LOW (ref 11.7–15.5)
MCH: 28.4 pg (ref 27.0–33.0)
MCHC: 32.4 g/dL (ref 32.0–36.0)
MCV: 87.6 fL (ref 80.0–100.0)
MPV: 10.5 fL (ref 7.5–12.5)
Platelets: 276 10*3/uL (ref 140–400)
RBC: 4.02 10*6/uL (ref 3.80–5.10)
RDW: 15 % (ref 11.0–15.0)
WBC: 9.7 10*3/uL (ref 3.8–10.8)

## 2021-10-11 LAB — TSH: TSH: 0.61 mIU/L (ref 0.40–4.50)

## 2021-10-11 LAB — VITAMIN D 25 HYDROXY (VIT D DEFICIENCY, FRACTURES): Vit D, 25-Hydroxy: 14 ng/mL — ABNORMAL LOW (ref 30–100)

## 2021-10-11 LAB — URIC ACID: Uric Acid, Serum: 5.8 mg/dL (ref 2.5–7.0)

## 2021-10-27 ENCOUNTER — Other Ambulatory Visit: Payer: Self-pay | Admitting: Pulmonary Disease

## 2021-10-28 NOTE — Telephone Encounter (Signed)
Dr. Vaughan Browner, please advise on med refill. ?

## 2021-11-02 ENCOUNTER — Telehealth: Payer: Self-pay | Admitting: Pulmonary Disease

## 2021-11-02 NOTE — Telephone Encounter (Signed)
Called and spoke with patient. She stated that she has developed thrush from using her albuterol inhaler. She has used the Nystatin swish and swallow before and it works well for her. Pharmacy is [facility] on [address].  ? ?While on the phone, she wanted to know about her lab results from January 2023 from her visit with [facility]. She never received a phone call in regards to the results.  ? ?Dr. Vaughan Browner, can you please advise about the thrush and lab results? Thanks!  ?

## 2021-11-11 NOTE — Telephone Encounter (Signed)
Labs are normal. ?Okay to send in nystatin swish and swallow to her pharmacy ?

## 2021-11-11 NOTE — Telephone Encounter (Signed)
Spoke with patient to let her know of recs from Dr. Vaughan Browner. She states that her PCP sent in the magic mouthwash for her already and she is doing better. Advised her to call if she needs anything else. Nothing further needed at this time.  ?

## 2021-11-11 NOTE — Telephone Encounter (Signed)
Attempted to call pt but unable to reach. Left message for her to return call. ?

## 2021-11-16 ENCOUNTER — Inpatient Hospital Stay: Admission: RE | Admit: 2021-11-16 | Payer: 59 | Source: Ambulatory Visit

## 2021-11-24 ENCOUNTER — Ambulatory Visit
Admission: RE | Admit: 2021-11-24 | Discharge: 2021-11-24 | Disposition: A | Discharge status: Home / Self Care | Payer: 59 | Source: Ambulatory Visit | Attending: Pulmonary Disease | Admitting: Pulmonary Disease

## 2021-11-24 DIAGNOSIS — R0602 Shortness of breath: Secondary | ICD-10-CM

## 2021-11-24 DIAGNOSIS — U099 Post covid-19 condition, unspecified: Secondary | ICD-10-CM

## 2021-11-27 ENCOUNTER — Other Ambulatory Visit (HOSPITAL_COMMUNITY): Payer: Self-pay | Admitting: Cardiology

## 2021-11-28 ENCOUNTER — Other Ambulatory Visit (HOSPITAL_COMMUNITY): Payer: Self-pay | Admitting: Cardiology

## 2021-11-30 ENCOUNTER — Encounter: Payer: Self-pay | Admitting: Pulmonary Disease

## 2021-11-30 ENCOUNTER — Ambulatory Visit (INDEPENDENT_AMBULATORY_CARE_PROVIDER_SITE_OTHER): Payer: 59 | Admitting: Pulmonary Disease

## 2021-11-30 VITALS — BP 120/80 | HR 90 | Temp 97.7°F | Ht 63.0 in | Wt 230.0 lb

## 2021-11-30 DIAGNOSIS — R0602 Shortness of breath: Secondary | ICD-10-CM

## 2021-11-30 DIAGNOSIS — Z5181 Encounter for therapeutic drug level monitoring: Secondary | ICD-10-CM

## 2021-11-30 DIAGNOSIS — U099 Post covid-19 condition, unspecified: Secondary | ICD-10-CM

## 2021-11-30 LAB — CBC WITH DIFFERENTIAL/PLATELET
Basophils Absolute: 0 10*3/uL (ref 0.0–0.1)
Basophils Relative: 0.4 % (ref 0.0–3.0)
Eosinophils Absolute: 0.1 10*3/uL (ref 0.0–0.7)
Eosinophils Relative: 0.6 % (ref 0.0–5.0)
HCT: 36 % (ref 36.0–46.0)
Hemoglobin: 11.7 g/dL — ABNORMAL LOW (ref 12.0–15.0)
Lymphocytes Relative: 5.6 % — ABNORMAL LOW (ref 12.0–46.0)
Lymphs Abs: 0.6 10*3/uL — ABNORMAL LOW (ref 0.7–4.0)
MCHC: 32.5 g/dL (ref 30.0–36.0)
MCV: 88.6 fl (ref 78.0–100.0)
Monocytes Absolute: 0.6 10*3/uL (ref 0.1–1.0)
Monocytes Relative: 6.5 % (ref 3.0–12.0)
Neutro Abs: 8.7 10*3/uL — ABNORMAL HIGH (ref 1.4–7.7)
Neutrophils Relative %: 86.9 % — ABNORMAL HIGH (ref 43.0–77.0)
Platelets: 326 10*3/uL (ref 150.0–400.0)
RBC: 4.06 Mil/uL (ref 3.87–5.11)
RDW: 16 % — ABNORMAL HIGH (ref 11.5–15.5)
WBC: 10 10*3/uL (ref 4.0–10.5)

## 2021-11-30 LAB — COMPREHENSIVE METABOLIC PANEL
ALT: 13 U/L (ref 0–35)
AST: 13 U/L (ref 0–37)
Albumin: 3.9 g/dL (ref 3.5–5.2)
Alkaline Phosphatase: 64 U/L (ref 39–117)
BUN: 13 mg/dL (ref 6–23)
CO2: 34 mEq/L — ABNORMAL HIGH (ref 19–32)
Calcium: 9.8 mg/dL (ref 8.4–10.5)
Chloride: 99 mEq/L (ref 96–112)
Creatinine, Ser: 1.04 mg/dL (ref 0.40–1.20)
GFR: 55.43 mL/min — ABNORMAL LOW (ref >60.00)
Glucose, Bld: 104 mg/dL — ABNORMAL HIGH (ref 70–99)
Potassium: 4.2 mEq/L (ref 3.5–5.1)
Sodium: 139 mEq/L (ref 135–145)
Total Bilirubin: 0.4 mg/dL (ref 0.2–1.2)
Total Protein: 7.2 g/dL (ref 6.0–8.3)

## 2021-11-30 NOTE — Progress Notes (Signed)
? ?      ?Tiffany Coleman    [id number]    1956-07-25 ? ?Primary Care Physician:Avbuere, Christean Grief, MD ? ?Referring Physician: Nolene Ebbs, MD ?[address] ?[place],  [place] [address] ? ?Chief complaint: Follow-up for asthma mild, post COVID-19 ? ?HPI: ?66 year old with history of asthma, morbid obesity, OSA ? ?Recent hospitalization for COVID-19 infection with prolonged hospital stay and discharged on 05/11/20 ?She was readmitted back to [facility] on 10/13 with hypoxic respiratory failure.  No PE on CTA but imaging shows extensive bilateral infiltrates. PCCM consulted and started on steroids for post Covid pneumonitis ?Discharged to rehab and now at home ? ?Continues on supplemental oxygen ?She attended pulmonary rehab but had desats so therapy was deferred ?She had a right heart cath by Dr. Aundra Dubin in November 2022 with mild pulmonary venous hypertension.  She is on Lasix for diuresis  ? ?Started on azathioprine as she was unable to come off steroids in spite of multiple attempts at taper. ? 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 No mold, hot tub, Jacuzzi ?Smoking history: Never smoker ?Travel history: Originally from [place]. No significant recent travel ?Relevant family history: No significant family issue of lung disease. ? ?Interim history: ?Dose of azithromycin was increased to 150 mg in January 2023.  She continues on a slow prednisone taper.  Currently at 10 mg/day ? ?States that breathing is improved.  Continues on supplemental oxygen ? ?Outpatient Encounter Medications as of 11/30/2021  ?Medication Sig  ? acetaminophen (TYLENOL) 325 MG tablet Take 2 tablets (650 mg total) by mouth every 6 (six) hours as needed for mild pain or headache (fever >/= 101).  ? albuterol (2.5 MG/3ML) 0.083% NEBU 3 mL, albuterol (5 MG/ML) 0.5% NEBU 0.5 mL Inhale 5 mg into the lungs once a week.  ? albuterol (VENTOLIN  HFA) 108 (90 Base) MCG/ACT inhaler Inhale 2 puffs into the lungs every 6 (six) hours as needed for wheezing or shortness of breath.  ? azaTHIOprine (IMURAN) 50 MG tablet Take 3 tablets (150 mg total) by mouth daily. Take 134m daily  ? fluticasone (FLONASE) 50 MCG/ACT nasal spray Place 2 sprays into both nostrils daily. (Patient taking differently: Place 2 sprays into both nostrils daily as needed for rhinitis.)  ? furosemide (LASIX) 20 MG tablet Take 1 tablet (20 mg total) by mouth 2 (two) times daily. NEEDS FOLLOW UP APPOINTMENT FOR MORE REFILLS  ? ipratropium-albuterol (DUONEB) 0.5-2.5 (3) MG/3ML SOLN Take 3 mLs by nebulization every 6 (six) hours as needed.  ? MOUNJARO 2.5 MG/0.5ML Pen SMARTSIG:2.5 Milligram(s) SUB-Q Once a Week  ? pantoprazole (PROTONIX) 40 MG tablet Take 1 tablet (40 mg total) by mouth daily at 6 (six) AM.  ? potassium chloride (KLOR-CON M) 10 MEQ tablet Take 1 tablet (10 mEq total) by mouth daily. NEEDS FOLLOW UP APPOINTMENT FOR MORE REFILLS  ? predniSONE (DELTASONE) 10 MG tablet TAKE 1 TABLET BY MOUTH EVERY DAY  ? promethazine-dextromethorphan (PROMETHAZINE-DM) 6.25-15 MG/5ML syrup Take 10 mLs by mouth 2 (two) times daily as needed for cough.  ? zolpidem (AMBIEN) 10 MG tablet Take 10 mg by mouth at bedtime as needed for sleep.  ? [DISCONTINUED] azaTHIOprine (IMURAN) 50 MG tablet Take 2 tablets (100 mg total) by mouth daily. (Patient taking differently: Take 50 mg by mouth 2 (two) times daily.)  ? [DISCONTINUED] predniSONE (DELTASONE) 20 MG tablet TAKE 1 TABLET(20 MG) BY MOUTH  DAILY WITH BREAKFAST  ? ?No facility-administered encounter medications on file as of 11/30/2021.  ? ? ?Physical Exam: ?Blood pressure 120/80, pulse 90, temperature 97.7 ?F (36.5 ?C), temperature source Oral, height _0  (1.6 m), weight 230 lb (104.3 kg), SpO2 100 %. ?Gen:      No acute distress ?HEENT:  EOMI, sclera anicteric ?Neck:     No masses; no thyromegaly ?Lungs:    Clear to auscultation bilaterally; normal  respiratory effort ?CV:         Regular rate and rhythm; no murmurs ?Abd:      + bowel sounds; soft, non-tender; no palpable masses, no distension ?Ext:    No edema; adequate peripheral perfusion ?Skin:      Warm and dry; no rash ?Neuro: alert and oriented x 3 ?Psych: normal mood and affect  ? ?Data Reviewed: ?Imaging: ?CTA 05/26/2020-no PE, diffuse bilateral interstitial infiltrates and groundglass opacities.  Overall improved compared to prior CT. ?Chest x-ray 07/13/2020 -persistent bilateral pulmonary infiltrates.  I have reviewed the images personally. ?High-res CT 10/01/2020-improvement in groundglass opacities with development of fibrotic changes consistent with evolving post-COVID ILD.  ?CTA 03/01/2021-no pulmonary embolism, stable diffuse interstitial opacities. ?High-res CT 11/24/2021-stable pattern of groundglass and fibrotic changes. ?I have reviewed the images personally. ? ?PFTs: ?08/31/2020 ?FVC 1.35 [50%], FEV1 1.28 [69%], F/F 95, TLC 1.99 [40%], DLCO 8.42 [44%] ?Severe restriction, diffusion defect ? ?Labs: ?CBC 07/13/2020-WBC 10.8, eos 0.1%, absolute eosinophil count 11 ?CMP 07/13/2020-normal except for slight increase in bicarb and glucose ? ?CTD serologies 02/08/2021- 1:320, cytoplasmic ? ?Cardiac: ?Echocardiogram 04/24/2020-LVEF 60 to 65%, moderate concentric LVH, RV systolic size and function are normal. ? ?[place] 05/20/2021 ?1. Mildly elevated right-sided filling pressures.  ?2. Mild pulmonary venous hypertension.  ?3. Normal PCWP.  ? ?Assessment:  ?Post Covid pneumonitis ?Currently on prednisone 10 mg ?She has been steroid responsive with flares whenever she tapers prednisone.  High-res CT reviewed with stable of fibrotic changes consistent with evolving post-COVID ILD.  ?CTD serologies show nonspecific elevation in ANA ? ?Since we are unable to come off prednisone we are attempting azathioprine ?Continue azathioprine to 150 mg a day and slow steroid taper ?Referred to pulmonary rehab again.  She could  not complete last year due to some desats but is in a better position now ?Continue supplemental oxygen.   ? ?Check CBC and CMP for monitoring ? ?Asthma ?She has history of asthma but no obstruction on PFTs.  She has not tolerated Symbicort due to thrush ?Continue duo nebs, albuterol as needed ? ?HFpEF ?Echo reviewed with EF 60-65%, LVH ?Right heart cath with no evidence of pulmonary arterial hypertension ?Continue Lasix ?Cardiology follow-up ? ?OSA ?Intolerant of CPAP and is not interested in retrying ?Split-night sleep study ordered last year but patient canceled as she just had her hair done and did not want to disturb it ? ?Plan/Recommendations: ?Continue azathioprine to 150 mg ?Wean prednisone to 5 mg for 1 month and then 2.5 mg for 1 month and then stop ?Continue supplemental oxygen ? ?Marshell Garfinkel MD ?[facility] ?11/30/2021, 2:28 PM ? ?CC: Nolene Ebbs, MD ? ? ?

## 2021-11-30 NOTE — Patient Instructions (Signed)
We will check some labs today including CBC and CMP ?Reduce prednisone to 5 mg a day for 1 month and then 2.5 mg a day for a month and then stop ?We will refer you back to pulmonary rehab for interstitial lung disease ?Follow-up in 3 months. ?

## 2021-12-01 ENCOUNTER — Telehealth (HOSPITAL_COMMUNITY): Payer: Self-pay

## 2021-12-01 NOTE — Telephone Encounter (Signed)
Called patient to see if she is interested in the Pulmonary Rehab Program. Patient expressed interest. Explained scheduling process, patient verbalized understanding.  ?

## 2021-12-05 ENCOUNTER — Telehealth: Payer: Self-pay | Admitting: Pulmonary Disease

## 2021-12-05 DIAGNOSIS — J9611 Chronic respiratory failure with hypoxia: Secondary | ICD-10-CM

## 2021-12-06 ENCOUNTER — Encounter (HOSPITAL_COMMUNITY): Payer: Self-pay | Admitting: *Deleted

## 2021-12-06 NOTE — Progress Notes (Signed)
Received referral from Dr. Vaughan Browner for this pt to participate in pulmonary rehab with the primary diagnosis of shortness of breath and secondary Covid 19 unspecified. Pt with prolonged hospitalization 05/11/2020 due to covid.   Well documented in progress notes as well as readmission notes persistent respiratory dysfunction for greater than 4 weeks in duration. Clinical review of pt follow up appt on 4/19 Pulmonary office note.  Pt with Covid Risk Score - 6. Pt appropriate for scheduling for Pulmonary rehab.  Will forward to support staff for scheduling and verification of insurance eligibility/benefits with pt consent. Cherre Huger, BSN ?Cardiac and Pulmonary Rehab Nurse Navigator  ? ?

## 2021-12-06 NOTE — Telephone Encounter (Signed)
Ok to place the order to DME ?

## 2021-12-06 NOTE — Telephone Encounter (Signed)
Pt aware Dr Vaughan Browner was okay with order and I have placed. Nothing further needed.  ?

## 2021-12-06 NOTE — Telephone Encounter (Signed)
Spoke with pt who states she spoke with DME and asked how to have home fill unit (not working according to pt) and get O2 portable tanks delivered to her home. Pt stated she needed order from provider stating removal of home fill component while leaving concentrator and 15 E tanks delivered to her home weekly. May we place this order Dr. Vaughan Browner  ?

## 2021-12-13 ENCOUNTER — Telehealth: Payer: Self-pay | Admitting: Pulmonary Disease

## 2021-12-13 NOTE — Telephone Encounter (Signed)
Called and spoke with rep at [place]. She confirmed that [employer] did not receive the order on 12/07/21. She has requested to have the order faxed to [phone].  ? ? ?PCCs, can we re-fax the order that was placed on 12/06/21? Thanks!  ?

## 2021-12-13 NOTE — Telephone Encounter (Signed)
I've tried to fax it a few times with no answer but I will keep trying.  ?

## 2021-12-19 ENCOUNTER — Telehealth: Payer: Self-pay | Admitting: Pulmonary Disease

## 2021-12-19 NOTE — Telephone Encounter (Signed)
Tiffany Coleman states [employer] has been in contact with her but she will check up on it ? ?

## 2021-12-19 NOTE — Telephone Encounter (Signed)
Patient calling in regards to encounter from 12/13/2021.  ? ?"Called and spoke with rep at [place]. She confirmed that [employer] did not receive the order on 12/07/21. She has requested to have the order faxed to [phone].  ?  ?  ?PCCs, can we re-fax the order that was placed on 12/06/21? Thanks! " ? ?Patient still has not heard anything- please advise.  ?

## 2021-12-27 ENCOUNTER — Other Ambulatory Visit (HOSPITAL_COMMUNITY): Payer: Self-pay | Admitting: Cardiology

## 2021-12-27 NOTE — Telephone Encounter (Signed)
Patient states [place] does not have order for oxygen. [place] fax number is [phone]. Patient phone number is [phone]. ?

## 2021-12-28 NOTE — Telephone Encounter (Signed)
Called [employer] & spoke to [place].  She states she has called pt & left her a vm and she is going to call her right now.  Nothing further needed.  ?

## 2021-12-30 NOTE — Telephone Encounter (Addendum)
I spoke to [facility] w/ [employer] who advised that YES, they have rec'd everything they need for this order.  Andee Poles states she will call the pt. Nothing further needed.

## 2022-01-14 ENCOUNTER — Encounter (HOSPITAL_BASED_OUTPATIENT_CLINIC_OR_DEPARTMENT_OTHER): Payer: Self-pay

## 2022-01-14 ENCOUNTER — Other Ambulatory Visit: Payer: Self-pay

## 2022-01-14 ENCOUNTER — Emergency Department (HOSPITAL_BASED_OUTPATIENT_CLINIC_OR_DEPARTMENT_OTHER)
Admission: EM | Admit: 2022-01-14 | Discharge: 2022-01-14 | Disposition: A | Discharge status: Home / Self Care | Payer: 59 | Attending: Emergency Medicine | Admitting: Emergency Medicine

## 2022-01-14 ENCOUNTER — Emergency Department (HOSPITAL_BASED_OUTPATIENT_CLINIC_OR_DEPARTMENT_OTHER): Payer: 59 | Admitting: Radiology

## 2022-01-14 DIAGNOSIS — M25512 Pain in left shoulder: Secondary | ICD-10-CM | POA: Diagnosis not present

## 2022-01-14 DIAGNOSIS — W19XXXA Unspecified fall, initial encounter: Secondary | ICD-10-CM | POA: Diagnosis not present

## 2022-01-14 DIAGNOSIS — S99912A Unspecified injury of left ankle, initial encounter: Secondary | ICD-10-CM | POA: Diagnosis not present

## 2022-01-14 DIAGNOSIS — Z91040 Latex allergy status: Secondary | ICD-10-CM | POA: Diagnosis not present

## 2022-01-14 DIAGNOSIS — S4992XA Unspecified injury of left shoulder and upper arm, initial encounter: Secondary | ICD-10-CM | POA: Insufficient documentation

## 2022-01-14 MED ORDER — IBUPROFEN 800 MG PO TABS: 800.0000 mg | ORAL_TABLET | Freq: Three times a day (TID) | ORAL | 0 refills | Status: DC

## 2022-01-14 MED ORDER — IBUPROFEN 800 MG PO TABS
800.0000 mg | ORAL_TABLET | Freq: Once | ORAL | Status: AC
  Administered 2022-01-14: 800 mg via ORAL
  Filled 2022-01-14: qty 1

## 2022-01-14 NOTE — ED Triage Notes (Signed)
Pt presents with injuries from a fall that happened 2 weeks prior. Pt c/o injury to L shoulder and L ankle.

## 2022-01-14 NOTE — ED Provider Notes (Signed)
[facility] EMERGENCY DEPT Provider Note   CSN: [id number] Arrival date & time: 01/14/22  1612     History  Chief Complaint  Patient presents with   Tiffany Coleman is a 66 y.o. female.  Pt is a 66 yo female presenting for left shoulder and left ankle pain after falling two weeks ago. Pt admits to continued left ankle pain and swelling after rest and motrin use at home. Admits to difficulty ambulating due to pain. Denies any wounds or bruising. Denies any sensation or motor deficits. Left shoulder pain is worse at night. Admits to limited mobility with raising arm above her head.   The history is provided by the patient. No language interpreter was used.  Fall Pertinent negatives include no chest pain, no abdominal pain and no shortness of breath.      Home Medications Prior to Admission medications   Medication Sig Start Date End Date Taking? Authorizing Provider  ibuprofen (ADVIL) 800 MG tablet Take 1 tablet (800 mg total) by mouth 3 (three) times daily. 04/17/84  Yes Campbell Stall P, DO  acetaminophen (TYLENOL) 325 MG tablet Take 2 tablets (650 mg total) by mouth every 6 (six) hours as needed for mild pain or headache (fever >/= 101). 05/11/20   Arrien, Jimmy Picket, MD  albuterol (2.5 MG/3ML) 0.083% NEBU 3 mL, albuterol (5 MG/ML) 0.5% NEBU 0.5 mL Inhale 5 mg into the lungs once a week.    [provider]  albuterol (VENTOLIN HFA) 108 (90 Base) MCG/ACT inhaler Inhale 2 puffs into the lungs every 6 (six) hours as needed for wheezing or shortness of breath. 09/06/20   Parrett, Fonnie Mu, NP  azaTHIOprine (IMURAN) 50 MG tablet Take 3 tablets (150 mg total) by mouth daily. Take 191m daily 08/22/21   [name], PHart Robinsons MD  fluticasone (FLONASE) 50 MCG/ACT nasal spray Place 2 sprays into both nostrils daily. Patient taking differently: Place 2 sprays into both nostrils daily as needed for rhinitis. 06/01/20   TEugenie Filler MD  furosemide (LASIX)  20 MG tablet Take 1 tablet (20 mg total) by mouth 2 (two) times daily. NEEDS FOLLOW UP APPOINTMENT FOR MORE REFILLS 11/28/21   MLarey Dresser MD  ipratropium-albuterol (DUONEB) 0.5-2.5 (3) MG/3ML SOLN Take 3 mLs by nebulization every 6 (six) hours as needed. 12/14/20   MMarshell Garfinkel MD  MOUNJARO 2.5 MG/0.5ML Pen SMARTSIG:2.5 Milligram(s) SUB-Q Once a Week 08/08/21   [provider]  pantoprazole (PROTONIX) 40 MG tablet Take 1 tablet (40 mg total) by mouth daily at 6 (six) AM. 06/01/20   TEugenie Filler MD  potassium chloride (KLOR-CON M) 10 MEQ tablet Take 1 tablet (10 mEq total) by mouth daily. Absolute last refill without office visit please call [phone] schedule 12/27/21   MLarey Dresser MD  predniSONE (DELTASONE) 10 MG tablet TAKE 1 TABLET BY MOUTH EVERY DAY 10/28/21   [name], PHart Robinsons MD  promethazine-dextromethorphan (PROMETHAZINE-DM) 6.25-15 MG/5ML syrup Take 10 mLs by mouth 2 (two) times daily as needed for cough. 05/04/21   [provider]  zolpidem (AMBIEN) 10 MG tablet Take 10 mg by mouth at bedtime as needed for sleep. 05/15/21   [provider]      Allergies    Latex, Other, and Tape    Review of Systems   Review of Systems  Constitutional:  Negative for chills and fever.  HENT:  Negative for ear pain and sore throat.  Eyes:  Negative for pain and visual disturbance.  Respiratory:  Negative for cough and shortness of breath.   Cardiovascular:  Negative for chest pain and palpitations.  Gastrointestinal:  Negative for abdominal pain and vomiting.  Genitourinary:  Negative for dysuria and hematuria.  Musculoskeletal:  Negative for arthralgias and back pain.  Skin:  Negative for color change and rash.  Neurological:  Negative for seizures and syncope.  All other systems reviewed and are negative.  Physical Exam Updated Vital Signs BP (!) 149/92 (BP Location: Right Arm)   Pulse 88   Temp 98.8 F (37.1 C) (Oral)   Resp 18   Ht _0   (1.626 m)   Wt 103.9 kg   SpO2 100%   BMI 39.31 kg/m  Physical Exam Vitals and nursing note reviewed.  Constitutional:      General: She is not in acute distress.    Appearance: She is well-developed.  HENT:     Head: Normocephalic and atraumatic.  Eyes:     Conjunctiva/sclera: Conjunctivae normal.  Cardiovascular:     Rate and Rhythm: Normal rate and regular rhythm.     Heart sounds: No murmur heard. Pulmonary:     Effort: Pulmonary effort is normal. No respiratory distress.     Breath sounds: Normal breath sounds.  Abdominal:     Palpations: Abdomen is soft.     Tenderness: There is no abdominal tenderness.  Musculoskeletal:        General: No swelling.     Left shoulder: Bony tenderness present. No swelling, deformity, effusion, laceration, tenderness or crepitus. Decreased range of motion. Normal strength. Normal pulse.     Right upper arm: Normal.     Left upper arm: Normal.     Cervical back: Neck supple.     Left lower leg: Normal.     Left ankle: Swelling present. No deformity or ecchymosis. Tenderness present. No lateral malleolus tenderness. Normal pulse.     Left foot: Normal.  Skin:    General: Skin is warm and dry.     Capillary Refill: Capillary refill takes less than 2 seconds.  Neurological:     Mental Status: She is alert.  Psychiatric:        Mood and Affect: Mood normal.    ED Results / Procedures / Treatments   Labs (all labs ordered are listed, but only abnormal results are displayed) Labs Reviewed - No data to display  EKG None  Radiology No results found.  Procedures Procedures    Medications Ordered in ED Medications  ibuprofen (ADVIL) tablet 800 mg (800 mg Oral Given 01/14/22 1805)    ED Course/ Medical Decision Making/ A&P                           Medical Decision Making Amount and/or Complexity of Data Reviewed Radiology: ordered.  Risk Prescription drug management.   3:06 PM 66 yo female presenting for left shoulder  and left ankle pain after falling two weeks ago. Pt is Aox3, no acute distress, afebrile, with stable vitals. Physical exam demonstrates decreased ROM and tenderness of AC joint concerning for rotator cuff tear. Xray demonstrates no acute fractures. Pt recommended for close follow up with orthopedic surgery for further management. Pt also has tenderness and swelling of her left lateral malleolus. Xray again demonstrates no fractures. Ace wrap applied for stability. Medications given for pain.   Patient in no distress and overall condition improved here in  the ED. Detailed discussions were had with the patient regarding current findings, and need for close f/u with PCP or on call doctor. The patient has been instructed to return immediately if the symptoms worsen in any way for re-evaluation. Patient verbalized understanding and is in agreement with current care plan. All questions answered prior to discharge.        Final Clinical Impression(s) / ED Diagnoses Final diagnoses:  Injury of left ankle, initial encounter  Injury of left shoulder, initial encounter    Rx / DC Orders ED Discharge Orders          Ordered    ibuprofen (ADVIL) 800 MG tablet  3 times daily        01/14/22 1756              Lianne Cure, DO 53/29/92 1506

## 2022-01-14 NOTE — Discharge Instructions (Addendum)
Call and make an appointment with orthopedic surgery first thing monday morning.

## 2022-01-18 ENCOUNTER — Telehealth (HOSPITAL_COMMUNITY): Payer: Self-pay

## 2022-01-18 NOTE — Telephone Encounter (Signed)
Called and spoke with pt in regards to [place], pt stated she is going out of town. Will be returning the 1st of July.

## 2022-02-03 ENCOUNTER — Other Ambulatory Visit: Payer: Self-pay | Admitting: Pulmonary Disease

## 2022-02-08 ENCOUNTER — Other Ambulatory Visit: Payer: Self-pay | Admitting: Pulmonary Disease

## 2022-02-13 ENCOUNTER — Telehealth: Payer: Self-pay | Admitting: Pulmonary Disease

## 2022-02-13 MED ORDER — PREDNISONE 5 MG PO TABS: ORAL_TABLET | ORAL | 0 refills | Status: AC

## 2022-02-13 NOTE — Telephone Encounter (Signed)
Plan/Recommendations from [place] 4/19: Continue azathioprine to 150 mg Wean prednisone to 5 mg for 1 month and then 2.5 mg for 1 month and then stop Continue supplemental oxygen    Called and spoke with pt about info stated at last OV. Pt said she just finished with 53m prednisone and never received Rx for 537m Stated to pt that I would send Rx to pharmacy for her and she verbalized understanding. Rx sent to pharmacy. Nothing further needed.

## 2022-03-01 ENCOUNTER — Ambulatory Visit: Payer: 59 | Admitting: Pulmonary Disease

## 2022-03-02 ENCOUNTER — Ambulatory Visit: Payer: 59 | Admitting: Pulmonary Disease

## 2022-03-17 ENCOUNTER — Telehealth: Payer: Self-pay | Admitting: Pulmonary Disease

## 2022-03-17 MED ORDER — PREDNISONE 10 MG PO TABS: 40.0000 mg | ORAL_TABLET | Freq: Every day | ORAL | 0 refills | Status: AC

## 2022-03-17 MED ORDER — PAXLOVID (300/100) 20 X 150 MG & 10 X 100MG PO TBPK: 3.0000 | ORAL_TABLET | Freq: Two times a day (BID) | ORAL | 0 refills | Status: DC

## 2022-03-17 NOTE — Telephone Encounter (Signed)
I have sent in an order for Paxlovid for 5 days and prednisone 40 mg a day for 5 days to her pharmacy.  Tell her to resume her chronic prednisone once 5-day course is done  If her breathing gets worse or her oxygen levels fall then she may need to go to the emergency room

## 2022-03-17 NOTE — Telephone Encounter (Signed)
Called and spoke with patient. She verbalized understanding.   Nothing further needed.  

## 2022-03-17 NOTE — Telephone Encounter (Signed)
Called pt's pharmacy and spoke with [place] about the paxlovid Rx and clarified info that Donnelly Angelica was needing. Nothing further needed.

## 2022-03-17 NOTE — Telephone Encounter (Signed)
Called and spoke with patient. Patient stated that she found out she had covid 2 days ago and she has a really bad cough. Patient stated that she's not coughing up anything but she feels awful and her cough is getting worse. She wants to know what Dr. Vaughan Browner thinks she should do.   MR, please advise.

## 2022-04-04 ENCOUNTER — Encounter: Payer: Self-pay | Admitting: Pulmonary Disease

## 2022-04-04 ENCOUNTER — Ambulatory Visit (INDEPENDENT_AMBULATORY_CARE_PROVIDER_SITE_OTHER): Payer: 59 | Admitting: Pulmonary Disease

## 2022-04-04 VITALS — BP 130/70 | HR 67 | Temp 98.3°F | Ht 63.0 in | Wt 216.4 lb

## 2022-04-04 DIAGNOSIS — Z5181 Encounter for therapeutic drug level monitoring: Secondary | ICD-10-CM | POA: Diagnosis not present

## 2022-04-04 DIAGNOSIS — J849 Interstitial pulmonary disease, unspecified: Secondary | ICD-10-CM | POA: Diagnosis not present

## 2022-04-04 MED ORDER — PREDNISONE 5 MG PO TABS: ORAL_TABLET | ORAL | 0 refills | Status: DC

## 2022-04-04 NOTE — Patient Instructions (Addendum)
I am glad you are feeling better and losing weight We will send in a new prescription for prednisone with 2.5 mg tablets Take 5 mg/day for the next 2 weeks and then go to 2.5 mg/day for a few weeks and then try to stop Continue the azathioprine Check CBC and CMP Get high-res CT and follow-up in 6 months

## 2022-04-04 NOTE — Addendum Note (Signed)
Addended by: Elton Sin on: 04/04/2022 05:06 PM   Modules accepted: Orders

## 2022-04-04 NOTE — Progress Notes (Signed)
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  No PE on CTA but imaging shows extensive bilateral infiltrates. 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 Aundra Dubin in November 2022 with mild pulmonary venous hypertension.  She is on Lasix for diuresis   Started on azathioprine as she was unable to come off steroids in spite of multiple attempts at taper.  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 No mold, hot tub, Jacuzzi Smoking history: Never smoker Travel history: Originally from [place]. No significant recent travel Relevant family history: No significant family issue of lung disease.  Interim history: Dose of azithromycin was increased to 150 mg in January 2023.  She continues on a slow prednisone taper.  Currently at 5 mg/day  She developed COVID again in early August and was treated with Paxlovid.  Overall she is actually feeling better and losing weight.  States that breathing is improved.  Continues on supplemental oxygen  Outpatient Encounter Medications as of 04/04/2022  Medication Sig   acetaminophen (TYLENOL) 325 MG tablet Take 2 tablets (650 mg total) by mouth every 6 (six) hours as needed for mild pain or headache (fever >/= 101).    albuterol (2.5 MG/3ML) 0.083% NEBU 3 mL, albuterol (5 MG/ML) 0.5% NEBU 0.5 mL Inhale 5 mg into the lungs once a week.   albuterol (VENTOLIN HFA) 108 (90 Base) MCG/ACT inhaler Inhale 2 puffs into the lungs every 6 (six) hours as needed for wheezing or shortness of breath.   azaTHIOprine (IMURAN) 50 MG tablet Take 3 tablets (150 mg total) by mouth daily. Take 159m daily   fluticasone (FLONASE) 50 MCG/ACT nasal spray Place 2 sprays into both nostrils daily. (Patient taking differently: Place 2 sprays into both nostrils daily as needed for rhinitis.)   furosemide (LASIX) 20 MG tablet Take 1 tablet (20 mg total) by mouth 2 (two) times daily. NEEDS FOLLOW UP APPOINTMENT FOR MORE REFILLS   ibuprofen (ADVIL) 800 MG tablet Take 1 tablet (800 mg total) by mouth 3 (three) times daily.   ipratropium-albuterol (DUONEB) 0.5-2.5 (3) MG/3ML SOLN Take 3 mLs by nebulization every 6 (six) hours as needed.   MOUNJARO 2.5 MG/0.5ML Pen SMARTSIG:2.5 Milligram(s) SUB-Q Once a Week   pantoprazole (PROTONIX) 40 MG tablet Take 1 tablet (40 mg total) by mouth daily at 6 (six) AM.   potassium chloride (KLOR-CON M) 10 MEQ tablet Take 1 tablet (10 mEq total) by mouth daily. Absolute last refill without office visit please call [phone] schedule   predniSONE (DELTASONE) 5 MG tablet Take 1 tablet (5 mg total) by mouth daily with breakfast for 30 days, THEN 0.5 tablets (2.5 mg total) daily with breakfast.   promethazine-dextromethorphan (PROMETHAZINE-DM) 6.25-15 MG/5ML syrup Take 10 mLs by mouth 2 (two) times daily as needed for  cough.   zolpidem (AMBIEN) 10 MG tablet Take 10 mg by mouth at bedtime as needed for sleep.   [DISCONTINUED] nirmatrelvir & ritonavir (PAXLOVID, 300/100,) 20 x 150 MG & 10 x 100MG TBPK Take 3 tablets by mouth 2 (two) times daily. 300 mg nirmatrelvir (two 150 mg tablets) and 100 mg ritonavir (one 100 mg tablet) with all 3 tablets taken together by mouth twice daily for 5 days.   No facility-administered  encounter medications on file as of 04/04/2022.    Physical Exam: Blood pressure 130/70, pulse 67, temperature 98.3 F (36.8 C), temperature source Oral, height _0  (1.6 m), weight 216 lb 6.4 oz (98.2 kg), SpO2 99 %. Gen:      No acute distress HEENT:  EOMI, sclera anicteric Neck:     No masses; no thyromegaly Lungs:    Clear to auscultation bilaterally; normal respiratory effort CV:         Regular rate and rhythm; no murmurs Abd:      + bowel sounds; soft, non-tender; no palpable masses, no distension Ext:    No edema; adequate peripheral perfusion Skin:      Warm and dry; no rash Neuro: alert and oriented x 3 Psych: normal mood and affect   Data Reviewed: Imaging: CTA 05/26/2020-no PE, diffuse bilateral interstitial infiltrates and groundglass opacities.  Overall improved compared to prior CT. Chest x-ray 07/13/2020 -persistent bilateral pulmonary infiltrates.  I have reviewed the images personally. High-res CT 10/01/2020-improvement in groundglass opacities with development of fibrotic changes consistent with evolving post-COVID ILD.  CTA 03/01/2021-no pulmonary embolism, stable diffuse interstitial opacities. High-res CT 11/24/2021-stable pattern of groundglass and fibrotic changes. I have reviewed the images personally.  PFTs: 08/31/2020 FVC 1.35 [50%], FEV1 1.28 [69%], F/F 95, TLC 1.99 [40%], DLCO 8.42 [44%] Severe restriction, diffusion defect  Labs: CBC 07/13/2020-WBC 10.8, eos 0.1%, absolute eosinophil count 11 CMP 07/13/2020-normal except for slight increase in bicarb and glucose  CTD serologies 02/08/2021- 1:320, cytoplasmic  Cardiac: Echocardiogram 04/24/2020-LVEF 60 to 65%, moderate concentric LVH, RV systolic size and function are normal.  RHC 05/20/2021 1. Mildly elevated right-sided filling pressures.  2. Mild pulmonary venous hypertension.  3. Normal PCWP.   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  High-res CT reviewed with stable of fibrotic changes consistent with evolving post-COVID ILD.  CTD serologies show nonspecific elevation in ANA  Since we are unable to come off prednisone we are attempting azathioprine Continue azathioprine to 150 mg a day and slow steroid taper.  Reduce by 2.5 mg every 2 weeks Referred to pulmonary rehab again.  She could not complete last year due to some desats but is in a better position now Continue supplemental oxygen.    Check CBC and CMP for monitoring  Asthma She has history of asthma but no obstruction on PFTs.  She has not tolerated Symbicort due to thrush Continue duo nebs, albuterol as needed  HFpEF Echo reviewed with EF 60-65%, LVH Right heart cath with no evidence of pulmonary arterial hypertension Continue Lasix Cardiology follow-up  OSA Intolerant of CPAP and is not interested in retrying Split-night sleep study ordered last year but patient canceled as she just had her hair done and did not want to disturb it  Plan/Recommendations: Continue azathioprine to 150 mg Slow prednisone taper Continue supplemental oxygen  Marshell Garfinkel MD [facility] 04/04/2022, 3:19 PM  CC: Nolene Ebbs, MD

## 2022-04-27 ENCOUNTER — Other Ambulatory Visit: Payer: Self-pay | Admitting: Pulmonary Disease

## 2022-05-01 ENCOUNTER — Telehealth: Payer: Self-pay | Admitting: Pulmonary Disease

## 2022-05-01 MED ORDER — PREDNISONE 5 MG PO TABS: 2.5000 mg | ORAL_TABLET | Freq: Every day | ORAL | 0 refills | Status: DC

## 2022-05-01 NOTE — Telephone Encounter (Signed)
Called and verified with patient the pharmacy and medication. Nothing further needed

## 2022-05-18 ENCOUNTER — Telehealth: Payer: Self-pay | Admitting: Pulmonary Disease

## 2022-05-19 NOTE — Telephone Encounter (Signed)
ATC x2. Received a busy tone. Will attempt to call back later.

## 2022-05-22 NOTE — Telephone Encounter (Signed)
Patient returning a call for POC, also needs refill of azathioprine 50MG tablets.

## 2022-05-23 MED ORDER — AZATHIOPRINE 50 MG PO TABS: 150.0000 mg | ORAL_TABLET | Freq: Every day | ORAL | 5 refills | Status: DC

## 2022-05-23 NOTE — Telephone Encounter (Signed)
I called and spoke with the pt. I refilled her Azithioprine. She says [facility] told her she is eligible for POC now, but needs appt to qualify. I have scheduled her for 06/01/22 with Beth. Nothing further needed.

## 2022-05-29 ENCOUNTER — Telehealth: Payer: Self-pay | Admitting: Pulmonary Disease

## 2022-05-29 NOTE — Telephone Encounter (Signed)
Called patient but she did not answer. Left message for her to call back.  

## 2022-05-30 ENCOUNTER — Telehealth (HOSPITAL_COMMUNITY): Payer: Self-pay

## 2022-05-30 MED ORDER — PREDNISONE 5 MG PO TABS: 5.0000 mg | ORAL_TABLET | Freq: Every day | ORAL | 0 refills | Status: AC

## 2022-05-30 NOTE — Telephone Encounter (Signed)
Called and spoke with pt in regards to PR, pt stated she is not interested at this time.   Closed referral

## 2022-05-30 NOTE — Telephone Encounter (Signed)
Called and spoke with patient.  Prednisone refill sent to requested pharmacy.  Nothing further at this time.

## 2022-05-30 NOTE — Telephone Encounter (Signed)
Spoke with pt who states she is out of prednisone which she has been taking 5 mg a day. Pt states she was unable to decrease to 2.5 mg daily as dicussed in August's OV. Pt is requesting a refill of 5 mg prednisone. Dr. Vaughan Browner please advise.

## 2022-05-30 NOTE — Telephone Encounter (Signed)
Okay to refill prednisone 5 mg a day.

## 2022-06-01 ENCOUNTER — Ambulatory Visit (INDEPENDENT_AMBULATORY_CARE_PROVIDER_SITE_OTHER): Payer: 59 | Admitting: Primary Care

## 2022-06-01 ENCOUNTER — Encounter: Payer: Self-pay | Admitting: Primary Care

## 2022-06-01 VITALS — BP 132/64 | HR 93 | Temp 98.5°F | Ht 63.0 in | Wt 213.4 lb

## 2022-06-01 DIAGNOSIS — R0602 Shortness of breath: Secondary | ICD-10-CM

## 2022-06-01 DIAGNOSIS — J984 Other disorders of lung: Secondary | ICD-10-CM | POA: Diagnosis not present

## 2022-06-01 DIAGNOSIS — J9611 Chronic respiratory failure with hypoxia: Secondary | ICD-10-CM

## 2022-06-01 NOTE — Patient Instructions (Addendum)
Nice seeing you today Tiffany Coleman You look well, no changes to plan Continue Imuran 150 mg by mouth daily You can try alternating 5 mg prednisone daily with 2.5 mg every other day x4 weeks; then decrease to 2.31m daily if able  Continue to wear 2 L of continuous oxygen 24/7.  You qualify for POC today, you will need 3 L on portable oxygen High-resolution CAT scan scheduled for February 7th  Follow-up - Mid-late February with Dr. MVaughan Browner please ensure that there is a recall in chart or schedule visit

## 2022-06-01 NOTE — Progress Notes (Signed)
_0  ID: Sibyl Parr, female    DOB: Nov 01, 1955, 66 y.o.   MRN: [id number]  Chief Complaint  Patient presents with   Follow-up    Sob-same, cough-white,qualify for POC    Referring provider: Nolene Ebbs, MD  HPI: 66 year old female, former smoker. PMH significant for post covid pneumonitis, OSA, asthma, chronic respiratory failure, HTN, GERD. Patient of Dr. Vaughan Browner.   Previous [facility] pulmonary encounter:  Chief complaint: Follow-up for asthma mild, post COVID-19  Hospitalization for COVID-19 infection with prolonged hospital stay and discharged on 05/11/20 She was readmitted back to [facility] long hospital on 10/13 with hypoxic respiratory failure.  No PE on CTA but imaging shows extensive bilateral infiltrates. 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 Aundra Dubin in November 2022 with mild pulmonary venous hypertension.  She is on Lasix for diuresis   Started on azathioprine as she was unable to come off steroids in spite of multiple attempts at taper.  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 No mold, hot tub, Jacuzzi Smoking history: Never smoker Travel history: Originally from [place]. No significant recent travel Relevant family history: No significant family issue of lung disease.  04/04/22- Dr. Vaughan Browner: Dose of azithromycin was increased to 150 mg in January 2023.  She continues on a slow prednisone taper.  Currently at 5 mg/day  She developed COVID again in early August and was treated with Paxlovid.  Overall she is actually feeling better and losing weight.  States that breathing is improved.  Continues on supplemental oxygen   06/01/2022- Interim hx  PMH significant for asthma, post  covid fibrosis, chronic respiratory failure, OSA  She is doing well today without acute complaints. No significant cough or shortness of breath.  She is on Imuran and 17m prednisone daily. She tried decreasing dose to 2.538mbut had some trouble with tapering dose.   Mobility is better. She no longer requires a walker to get around.  She uses 2L continuous oxygen. She is interested in getting portable oxygen concentrator for travel.  [place]  DME is [facility]   Allergies  Allergen Reactions   Latex Hives and Itching    Gloves with power   Other Itching    When take blood the material they put around arm causes itching   Tape Hives, Itching and Other (See Comments)    Coban wrap, in particular     There is no immunization history on file for this patient.  Past Medical History:  Diagnosis Date   Anemia yrs ago   Arthritis    knees   Bronchitis    hx   CHF (congestive heart failure) (HCC)    Complication of anesthesia    trouble breathing when wake up needs breathing tx when wakes up   Hx of colonic polyps 2015   Obstructive sleep apnea 06/02/2016   No CPAP used   Recurrent ventral hernia 08/20/40/5956 Umbilical hernia     Tobacco History: Social History   Tobacco Use  Smoking Status Former   Years: 20.00   Types: Cigarettes   Quit date: 05/26/2020   Years since quitting: 2.0  Smokeless Tobacco Never   Counseling  given: Not Answered   Outpatient Medications Prior to Visit  Medication Sig Dispense Refill   acetaminophen (TYLENOL) 325 MG tablet Take 2 tablets (650 mg total) by mouth every 6 (six) hours as needed for mild pain or headache (fever >/= 101).     albuterol (2.5 MG/3ML) 0.083% NEBU 3 mL, albuterol (5 MG/ML) 0.5% NEBU 0.5 mL Inhale 5 mg into the lungs once a week.     albuterol (VENTOLIN HFA) 108 (90 Base) MCG/ACT inhaler Inhale 2 puffs into the lungs every 6 (six) hours as needed for wheezing or shortness of breath. 1 each  2   azaTHIOprine (IMURAN) 50 MG tablet Take 3 tablets (150 mg total) by mouth daily. Take 137m daily 90 tablet 5   fluticasone (FLONASE) 50 MCG/ACT nasal spray Place 2 sprays into both nostrils daily. (Patient taking differently: Place 2 sprays into both nostrils daily as needed for rhinitis.)  2   furosemide (LASIX) 20 MG tablet Take 1 tablet (20 mg total) by mouth 2 (two) times daily. NEEDS FOLLOW UP APPOINTMENT FOR MORE REFILLS 90 tablet 0   ibuprofen (ADVIL) 800 MG tablet Take 1 tablet (800 mg total) by mouth 3 (three) times daily. 21 tablet 0   ipratropium-albuterol (DUONEB) 0.5-2.5 (3) MG/3ML SOLN Take 3 mLs by nebulization every 6 (six) hours as needed. 360 mL 5   MOUNJARO 2.5 MG/0.5ML Pen SMARTSIG:2.5 Milligram(s) SUB-Q Once a Week     pantoprazole (PROTONIX) 40 MG tablet Take 1 tablet (40 mg total) by mouth daily at 6 (six) AM. 30 tablet 2   potassium chloride (KLOR-CON M) 10 MEQ tablet Take 1 tablet (10 mEq total) by mouth daily. Absolute last refill without office visit please call [phone] schedule 30 tablet 0   predniSONE (DELTASONE) 5 MG tablet Take 1 tablet (5 mg total) by mouth daily with breakfast. Take 554mdaily (Patient taking differently: Take 5 mg by mouth daily with breakfast. Take 1/2 tablet daily) 30 tablet 0   promethazine-dextromethorphan (PROMETHAZINE-DM) 6.25-15 MG/5ML syrup Take 10 mLs by mouth 2 (two) times daily as needed for cough.     zolpidem (AMBIEN) 10 MG tablet Take 10 mg by mouth at bedtime as needed for sleep.     No facility-administered medications prior to visit.    Review of Systems  Review of Systems  Constitutional:  Negative for fatigue.  HENT: Negative.    Respiratory: Negative.  Negative for cough and shortness of breath.   Cardiovascular: Negative.    Physical Exam  BP 132/64 (BP Location: Left Arm, Cuff Size: Large)   Pulse 93   Temp 98.5 F (36.9 C) (Temporal)   Ht _0  (1.6 m)   Wt 213 lb 6.4 oz (96.8 kg)   SpO2 100%   BMI  37.80 kg/m  Physical Exam Constitutional:      Appearance: Normal appearance.  HENT:     Head: Normocephalic and atraumatic.     Mouth/Throat:     Mouth: Mucous membranes are moist.     Pharynx: Oropharynx is clear.  Cardiovascular:     Rate and Rhythm: Normal rate and regular rhythm.  Pulmonary:     Effort: Pulmonary effort is normal.     Breath sounds: Normal breath sounds.     Comments: 2L continuous oxygen  Neurological:     General: No focal deficit present.     Mental Status: She is alert and oriented to person, place, and time. Mental status is at baseline.  Psychiatric:  Mood and Affect: Mood normal.        Behavior: Behavior normal.        Thought Content: Thought content normal.        Judgment: Judgment normal.      Lab Results:  CBC    Component Value Date/Time   WBC 10.0 11/30/2021 1440   RBC 4.06 11/30/2021 1440   HGB 11.7 (L) 11/30/2021 1440   HGB 12.5 04/25/2019 1131   HCT 36.0 11/30/2021 1440   HCT 37.8 04/25/2019 1131   PLT 326.0 11/30/2021 1440   PLT 219 04/25/2019 1131   MCV 88.6 11/30/2021 1440   MCV 84 04/25/2019 1131   MCH 28.4 10/10/2021 0000   MCHC 32.5 11/30/2021 1440   RDW 16.0 (H) 11/30/2021 1440   RDW 13.2 04/25/2019 1131   LYMPHSABS 0.6 (L) 11/30/2021 1440   MONOABS 0.6 11/30/2021 1440   EOSABS 0.1 11/30/2021 1440   BASOSABS 0.0 11/30/2021 1440    BMET    Component Value Date/Time   NA 139 11/30/2021 1440   NA 140 03/06/2017 1658   K 4.2 11/30/2021 1440   CL 99 11/30/2021 1440   CO2 34 (H) 11/30/2021 1440   GLUCOSE 104 (H) 11/30/2021 1440   BUN 13 11/30/2021 1440   BUN 10 03/06/2017 1658   CREATININE 1.04 11/30/2021 1440   CREATININE 0.93 10/10/2021 0000   CALCIUM 9.8 11/30/2021 1440   GFRNONAA 56 (L) 06/02/2021 1037   GFRAA >60 05/09/2020 0434    BNP    Component Value Date/Time   BNP 45.7 05/26/2020 1319    ProBNP    Component Value Date/Time   PROBNP 86 02/08/2021 1018   PROBNP 66.0 07/13/2020 1603     Imaging: No results found.   Assessment & Plan:   Pneumonitis: Post Covid pneumonitis - Stable; No new or acute respiratory symptoms. Maintained on Azathioprine 158m daily. Plan is to taper off prednisone. She is currently on 511mprednisone daily, advised alternating with 2.17m69mvery other day then decreasing to 2.17mg13mily if able to tolerate. Due for repeat HRCT in February and will follow-up with Dr. MannVaughan Brownerer CT imaging   [facility] is on 2L continuous flow oxygen 24/7. Needs portable oxygen to help increase mobility  - She qualified for POC, will need 3L on pulsed oxygen    ElizMartyn Ehrich 06/01/2022

## 2022-06-01 NOTE — Assessment & Plan Note (Signed)
-  Patient is on 2L continuous flow oxygen 24/7. Needs portable oxygen to help increase mobility  - She qualified for POC, will need 3L on pulsed oxygen

## 2022-06-01 NOTE — Assessment & Plan Note (Addendum)
-  Stable; No new or acute respiratory symptoms. Maintained on Azathioprine 174m daily. Plan is to taper off prednisone. She is currently on 547mprednisone daily, advised alternating with 2.2m42mvery other day then decreasing to 2.2mg26mily if able to tolerate. Due for repeat HRCT in February and will follow-up with Dr. MannVaughan Brownerer CT imaging

## 2022-06-05 ENCOUNTER — Telehealth: Payer: Self-pay | Admitting: Pulmonary Disease

## 2022-06-05 NOTE — Telephone Encounter (Signed)
Called and spoke to patient and advised her that the POC order was just sent on 10/19 and it does take a few days to process the order. And that I would check with [facility] in a few days regarding the order. Nothing further needed

## 2022-06-06 ENCOUNTER — Telehealth: Payer: Self-pay | Admitting: Pulmonary Disease

## 2022-06-07 NOTE — Telephone Encounter (Signed)
See encounter from 10/23.

## 2022-06-07 NOTE — Telephone Encounter (Signed)
Patient is upset and wanted clarification of if she has to take another 6 minute walk test with her primary provider. She does not want to schedule if she doesn't have to.  Patient states that she passed her 6 minute walking test with L[facility]U and wanted to talk to someone about her medication refills as well.  Please call [phone] with clarification.

## 2022-06-08 ENCOUNTER — Telehealth: Payer: Self-pay | Admitting: Pulmonary Disease

## 2022-06-08 NOTE — Telephone Encounter (Signed)
Pt was seen by [facility] 10/19 and during the OV, pt was walked via POC. Called and spoke with pt who stated she received a call from DME stating that she needed to go to their facility Monday at 12pm for a walk test which pt is confused about and wanting to know why.  PCCs, please advise that the walk that was done during OV was sent to DME along with the order for POC.

## 2022-06-12 ENCOUNTER — Telehealth: Payer: Self-pay | Admitting: Pulmonary Disease

## 2022-06-12 NOTE — Telephone Encounter (Signed)
I have sent urgent message to [employer] asking them to check into this issue

## 2022-06-13 ENCOUNTER — Telehealth: Payer: Self-pay | Admitting: Pulmonary Disease

## 2022-06-13 DIAGNOSIS — J9611 Chronic respiratory failure with hypoxia: Secondary | ICD-10-CM

## 2022-06-13 DIAGNOSIS — J849 Interstitial pulmonary disease, unspecified: Secondary | ICD-10-CM

## 2022-06-14 NOTE — Telephone Encounter (Signed)
Pt will be traveling to the [place] from Nov. 17th until around Dec. 1st and is requesting an O2 and concentrator orders be sent to [facility]. Pt is currently on 2L 24/7 and 3 L via POC. Dr. Vaughan Browner please advise on order placement.

## 2022-06-15 NOTE — Telephone Encounter (Signed)
Order has been placed. Called and spoke with pt letting her know that it was taken care of and she verbalized understanding. Nothing further needed.

## 2022-06-15 NOTE — Telephone Encounter (Signed)
Ok to send O2 order as requested to DME

## 2022-06-16 NOTE — Telephone Encounter (Signed)
This patient received their POC on 06/13/22. Per Melissa with [employer]

## 2022-06-19 ENCOUNTER — Other Ambulatory Visit: Payer: Self-pay

## 2022-06-19 ENCOUNTER — Emergency Department (HOSPITAL_BASED_OUTPATIENT_CLINIC_OR_DEPARTMENT_OTHER)
Admission: EM | Admit: 2022-06-19 | Discharge: 2022-06-19 | Disposition: A | Discharge status: Home / Self Care | Payer: 59 | Attending: Emergency Medicine | Admitting: Emergency Medicine

## 2022-06-19 ENCOUNTER — Emergency Department (HOSPITAL_BASED_OUTPATIENT_CLINIC_OR_DEPARTMENT_OTHER): Payer: 59

## 2022-06-19 ENCOUNTER — Encounter (HOSPITAL_BASED_OUTPATIENT_CLINIC_OR_DEPARTMENT_OTHER): Payer: Self-pay

## 2022-06-19 DIAGNOSIS — Z8616 Personal history of COVID-19: Secondary | ICD-10-CM | POA: Insufficient documentation

## 2022-06-19 DIAGNOSIS — R300 Dysuria: Secondary | ICD-10-CM | POA: Insufficient documentation

## 2022-06-19 DIAGNOSIS — R1031 Right lower quadrant pain: Secondary | ICD-10-CM | POA: Diagnosis not present

## 2022-06-19 DIAGNOSIS — Z91040 Latex allergy status: Secondary | ICD-10-CM | POA: Diagnosis not present

## 2022-06-19 LAB — COMPREHENSIVE METABOLIC PANEL
ALT: 7 U/L (ref 0–44)
AST: 11 U/L — ABNORMAL LOW (ref 15–41)
Albumin: 4.1 g/dL (ref 3.5–5.0)
Alkaline Phosphatase: 50 U/L (ref 38–126)
Anion gap: 8 (ref 5–15)
BUN: 10 mg/dL (ref 8–23)
CO2: 32 mmol/L (ref 22–32)
Calcium: 10.5 mg/dL — ABNORMAL HIGH (ref 8.9–10.3)
Chloride: 102 mmol/L (ref 98–111)
Creatinine, Ser: 0.96 mg/dL (ref 0.44–1.00)
GFR, Estimated: >60 mL/min (ref >60)
Glucose, Bld: 94 mg/dL (ref 70–99)
Potassium: 4.1 mmol/L (ref 3.5–5.1)
Sodium: 142 mmol/L (ref 135–145)
Total Bilirubin: 0.6 mg/dL (ref 0.3–1.2)
Total Protein: 7.2 g/dL (ref 6.5–8.1)

## 2022-06-19 LAB — CBC WITH DIFFERENTIAL/PLATELET
Abs Immature Granulocytes: 0.02 10*3/uL (ref 0.00–0.07)
Basophils Absolute: 0 10*3/uL (ref 0.0–0.1)
Basophils Relative: 0 %
Eosinophils Absolute: 0.2 10*3/uL (ref 0.0–0.5)
Eosinophils Relative: 3 %
HCT: 34.9 % — ABNORMAL LOW (ref 36.0–46.0)
Hemoglobin: 11 g/dL — ABNORMAL LOW (ref 12.0–15.0)
Immature Granulocytes: 0 %
Lymphocytes Relative: 13 %
Lymphs Abs: 0.8 10*3/uL (ref 0.7–4.0)
MCH: 28.6 pg (ref 26.0–34.0)
MCHC: 31.5 g/dL (ref 30.0–36.0)
MCV: 90.9 fL (ref 80.0–100.0)
Monocytes Absolute: 0.4 10*3/uL (ref 0.1–1.0)
Monocytes Relative: 8 %
Neutro Abs: 4.4 10*3/uL (ref 1.7–7.7)
Neutrophils Relative %: 76 %
Platelets: 263 10*3/uL (ref 150–400)
RBC: 3.84 MIL/uL — ABNORMAL LOW (ref 3.87–5.11)
RDW: 14.6 % (ref 11.5–15.5)
WBC: 5.8 10*3/uL (ref 4.0–10.5)
nRBC: 0 % (ref 0.0–0.2)

## 2022-06-19 LAB — URINALYSIS, ROUTINE W REFLEX MICROSCOPIC
Bilirubin Urine: NEGATIVE
Glucose, UA: NEGATIVE mg/dL
Hgb urine dipstick: NEGATIVE
Ketones, ur: 15 mg/dL — AB
Leukocytes,Ua: NEGATIVE
Nitrite: NEGATIVE
Protein, ur: NEGATIVE mg/dL
Specific Gravity, Urine: 1.019 (ref 1.005–1.030)
pH: 5.5 (ref 5.0–8.0)

## 2022-06-19 LAB — LIPASE, BLOOD: Lipase: 16 U/L (ref 11–51)

## 2022-06-19 MED ORDER — IOHEXOL 300 MG/ML  SOLN
100.0000 mL | Freq: Once | INTRAMUSCULAR | Status: AC | PRN
  Administered 2022-06-19: 80 mL via INTRAVENOUS

## 2022-06-19 MED ORDER — SODIUM CHLORIDE 0.9 % IV BOLUS
500.0000 mL | Freq: Once | INTRAVENOUS | Status: AC
Start: 2022-06-19 — End: 2022-06-19
  Administered 2022-06-19: 500 mL via INTRAVENOUS

## 2022-06-19 MED ORDER — FLUCONAZOLE 150 MG PO TABS
150.0000 mg | ORAL_TABLET | Freq: Once | ORAL | Status: AC
  Administered 2022-06-19: 150 mg via ORAL
  Filled 2022-06-19: qty 1

## 2022-06-19 MED ORDER — FLUCONAZOLE 150 MG PO TABS: 150.0000 mg | ORAL_TABLET | Freq: Every day | ORAL | 0 refills | Status: AC

## 2022-06-19 NOTE — ED Triage Notes (Signed)
Pt c/o rt. Flank pain, back pain and rt. Lower quad pain. Pt was prescribed Macrobid but has not taken it regularly. +nausea

## 2022-06-19 NOTE — ED Notes (Signed)
Discharge paperwork given and verbally understood. 

## 2022-06-19 NOTE — Discharge Instructions (Addendum)
You have been seen today for your complaint of dysuria, frequency, urgency, and abdominal pain. Your lab work was reassuring and showed no abnormalities. Your imaging was reassuring and showed no abnormalities. Your discharge medications include fluconazole. You should take one pill 3 days from now. Home care instructions are as follows:  Drink plenty of fluids. Follow up with: your PCP within the next week. Please seek immediate medical care if you develop any of the following symptoms: Your pain is severe and not relieved with medicines. You cannot eat or drink without vomiting. You are confused. You have a rapid heartbeat while resting. You have shaking or chills. You feel extremely weak. At this time there does not appear to be the presence of an emergent medical condition, however there is always the potential for conditions to change. Please read and follow the below instructions.  Do not take your medicine if  develop an itchy rash, swelling in your mouth or lips, or difficulty breathing; call 911 and seek immediate emergency medical attention if this occurs.  You may review your lab tests and imaging results in their entirety on your MyChart account.  Please discuss all results of fully with your primary care provider and other specialist at your follow-up visit.  Note: Portions of this text may have been transcribed using voice recognition software. Every effort was made to ensure accuracy; however, inadvertent computerized transcription errors may still be present.

## 2022-06-19 NOTE — ED Provider Notes (Signed)
[facility] EMERGENCY DEPT Provider Note   CSN: [id number] Arrival date & time: 06/19/22  1427     History  Chief Complaint  Patient presents with   rt. flank pain   Back Pain   Abdominal Pain    Tiffany Coleman is a 66 y.o. female.  Who presents ED for evaluation of dysuria and right flank pain.  She reports the symptoms started approximately 6 days ago.  She states she feels an itching pain when she urinates along with frequency and urgency.  Describes this as a discomfort.  Specifically states it does not hurt.  States she has a history of UTIs and believes that is what she has.  States she called her primary care provider couple days ago and had them send a prescription for UTI antibiotics.  States she has been taking these for the most part, but has not taken any today. No change in her symptoms. Also has slight pain in her right lower back with radiation into the right flank and RLQ of abdomen.  Reports associated nausea. States she has lung disease from [place] and has had burning pains in her lungs that occasionally radiate to her back. Denies fevers, chills, vomiting, diarrhea, melena, hematochezia, hematuria, chest pain, worsening of baseline shortness of breath, vaginal discharge, odor, pain or itching, pelvic pain.   Back Pain Associated symptoms: abdominal pain   Abdominal Pain      Home Medications Prior to Admission medications   Medication Sig Start Date End Date Taking? Authorizing Provider  fluconazole (DIFLUCAN) 150 MG tablet Take 1 tablet (150 mg total) by mouth daily for 1 dose. 06/19/22 06/20/22 Yes Trixy Loyola, Grafton Folk, PA-C  acetaminophen (TYLENOL) 325 MG tablet Take 2 tablets (650 mg total) by mouth every 6 (six) hours as needed for mild pain or headache (fever >/= 101). 05/11/20   Arrien, Jimmy Picket, MD  albuterol (2.5 MG/3ML) 0.083% NEBU 3 mL, albuterol (5 MG/ML) 0.5% NEBU 0.5 mL Inhale 5 mg into the lungs once a week.    [provider]  albuterol (VENTOLIN HFA) 108 (90 Base) MCG/ACT inhaler Inhale 2 puffs into the lungs every 6 (six) hours as needed for wheezing or shortness of breath. 09/06/20   Parrett, Fonnie Mu, NP  azaTHIOprine (IMURAN) 50 MG tablet Take 3 tablets (150 mg total) by mouth daily. Take 181m daily 05/23/22   [name], Praveen, MD  fluticasone (FLONASE) 50 MCG/ACT nasal spray Place 2 sprays into both nostrils daily. Patient taking differently: Place 2 sprays into both nostrils daily as needed for rhinitis. 06/01/20   TEugenie Filler MD  furosemide (LASIX) 20 MG tablet Take 1 tablet (20 mg total) by mouth 2 (two) times daily. NEEDS FOLLOW UP APPOINTMENT FOR MORE REFILLS 11/28/21   MLarey Dresser MD  ibuprofen (ADVIL) 800 MG tablet Take 1 tablet (800 mg total) by mouth 3 (three) times daily. 68/6/75  GCampbell StallP, DO  ipratropium-albuterol (DUONEB) 0.5-2.5 (3) MG/3ML SOLN Take 3 mLs by nebulization every 6 (six) hours as needed. 12/14/20   MMarshell Garfinkel MD  MOUNJARO 2.5 MG/0.5ML Pen SMARTSIG:2.5 Milligram(s) SUB-Q Once a Week 08/08/21   [provider]  pantoprazole (PROTONIX) 40 MG tablet Take 1 tablet (40 mg total) by mouth daily at 6 (six) AM. 06/01/20   TEugenie Filler MD  potassium chloride (KLOR-CON M) 10 MEQ tablet Take 1 tablet (10 mEq total) by mouth daily. Absolute last refill without office visit please call [phone] schedule 12/27/21  Larey Dresser, MD  predniSONE (DELTASONE) 5 MG tablet Take 1 tablet (5 mg total) by mouth daily with breakfast. Take 63m daily Patient taking differently: Take 5 mg by mouth daily with breakfast. Take 1/2 tablet daily 05/30/22 06/29/22  [name], PHart Robinsons MD  promethazine-dextromethorphan (PROMETHAZINE-DM) 6.25-15 MG/5ML syrup Take 10 mLs by mouth 2 (two) times daily as needed for cough. 05/04/21   [provider]  zolpidem (AMBIEN) 10 MG tablet Take 10 mg by mouth at bedtime as needed for sleep. 05/15/21   [provider]      Allergies    Latex, Other, and Tape    Review of Systems   Review of Systems  Gastrointestinal:  Positive for abdominal pain.  Genitourinary:  Positive for urgency.  Musculoskeletal:  Positive for back pain.  All other systems reviewed and are negative.   Physical Exam Updated Vital Signs BP (!) 146/96 (BP Location: Right Arm)   Pulse 63   Temp 98 F (36.7 C)   Resp 16   Ht _0  (1.6 m)   Wt 98 kg   SpO2 100%   BMI 38.26 kg/m  Physical Exam Vitals and nursing note reviewed.  Constitutional:      General: She is not in acute distress.    Appearance: Normal appearance. She is well-developed. She is obese. She is not ill-appearing, toxic-appearing or diaphoretic.  HENT:     Head: Normocephalic and atraumatic.  Cardiovascular:     Rate and Rhythm: Normal rate and regular rhythm.     Heart sounds: Normal heart sounds. No murmur heard. Pulmonary:     Effort: Pulmonary effort is normal. No respiratory distress.     Breath sounds: Normal breath sounds.  Abdominal:     General: Abdomen is flat. A surgical scar is present. Bowel sounds are normal.     Palpations: Abdomen is soft.     Tenderness: There is abdominal tenderness in the suprapubic area. There is no right CVA tenderness, left CVA tenderness, guarding or rebound. Negative signs include Murphy's sign and Rovsing's sign.  Musculoskeletal:        General: Normal range of motion.     Cervical back: Neck supple.  Skin:    General: Skin is warm and dry.     Capillary Refill: Capillary refill takes less than 2 seconds.  Neurological:     General: No focal deficit present.     Mental Status: She is alert and oriented to person, place, and time.  Psychiatric:        Mood and Affect: Mood normal.        Behavior: Behavior normal.     ED Results / Procedures / Treatments   Labs (all labs ordered are listed, but only abnormal results are displayed) Labs Reviewed  COMPREHENSIVE METABOLIC PANEL - Abnormal;  Notable for the following components:      Result Value   Calcium 10.5 (*)    AST 11 (*)    All other components within normal limits  CBC WITH DIFFERENTIAL/PLATELET - Abnormal; Notable for the following components:   RBC 3.84 (*)    Hemoglobin 11.0 (*)    HCT 34.9 (*)    All other components within normal limits  URINALYSIS, ROUTINE W REFLEX MICROSCOPIC - Abnormal; Notable for the following components:   Ketones, ur 15 (*)    All other components within normal limits  LIPASE, BLOOD    EKG None  Radiology CT Abdomen Pelvis W Contrast  Result Date:  06/19/2022 CLINICAL DATA:  Right lower quadrant abdominal pain EXAM: CT ABDOMEN AND PELVIS WITH CONTRAST TECHNIQUE: Multidetector CT imaging of the abdomen and pelvis was performed using the standard protocol following bolus administration of intravenous contrast. RADIATION DOSE REDUCTION: This exam was performed according to the departmental dose-optimization program which includes automated exposure control, adjustment of the mA and/or kV according to patient size and/or use of iterative reconstruction technique. CONTRAST:  16m OMNIPAQUE IOHEXOL 300 MG/ML  SOLN COMPARISON:  CT abdomen and pelvis 03/01/2021 FINDINGS: Lower chest: No acute abnormality. Hepatobiliary: No focal liver abnormality is seen. No gallstones, gallbladder wall thickening, or biliary dilatation. Pancreas: Unremarkable. No pancreatic ductal dilatation or surrounding inflammatory changes. Spleen: Normal in size without focal abnormality. Adrenals/Urinary Tract: Unremarkable adrenal glands. Low-attenuation lesions in the kidneys are statistically likely to represent cysts. No follow-up is required. No urinary calculi or hydronephrosis. Unremarkable bladder. Stomach/Bowel: Unremarkable stomach. Normal caliber large and small bowel. Unchanged fat density lesion in the distal small bowel (2/68) likely a intramural lipoma. Normal appendix. Colonic diverticulosis without  diverticulitis. Vascular/Lymphatic: Aortic atherosclerosis. No enlarged abdominal or pelvic lymph nodes. Reproductive: Unremarkable. Other: No free intraperitoneal fluid or air. Musculoskeletal: No acute osseous abnormality. IMPRESSION: 1. No acute abnormality in the abdomen or pelvis. 2.  Colonic diverticulosis without diverticulitis. 3.  Aortic Atherosclerosis (ICD10-I70.0). Electronically Signed   By: TPlacido SouM.D.   On: 06/19/2022 18:55    Procedures Procedures    Medications Ordered in ED Medications  fluconazole (DIFLUCAN) tablet 150 mg (has no administration in time range)  sodium chloride 0.9 % bolus 500 mL (500 mLs Intravenous New Bag/Given 06/19/22 1840)  iohexol (OMNIPAQUE) 300 MG/ML solution 100 mL (80 mLs Intravenous Contrast Given 06/19/22 1834)    ED Course/ Medical Decision Making/ A&P Clinical Course as of 06/19/22 1916  Mon Jun 19, 2022  1818 Stable 89YOF with a chief complaint of right flank pain and urinary frequency/urgency. Mostly pruritic. Taking macrobid. Getting CT AP with contrast.  Treat for yeast infection and reassess.  [CC]  1902 CT Abdomen Pelvis W Contrast I personally reviewed the image. No intra abdominal  abnormalities.  [AS]    Clinical Course User Index [AS] Donie Lemelin, AGrafton Folk PA-C [CC] CTretha Sciara MD                           Medical Decision Making Amount and/or Complexity of Data Reviewed Labs: ordered. Radiology: ordered.  Risk Prescription drug management.  This patient presents to the ED for concern of dysuria, frequency, urgency, and abdominal pain, this involves an extensive number of treatment options, and is a complaint that carries with it a high risk of complications and morbidity.  The differential diagnosis of emergent flank pain includes, but is not limited to :Abdominal aortic aneurysm,, Renal artery embolism,Renal vein thrombosis, Aortic dissection, Mesenteric ischemia, Pyelonephritis, Renal infarction, Renal  hemorrhage, Nephrolithiasis/ Renal Colic, Bladder tumor,Cystitis, Biliary colic, Pancreatitis Perforated peptic ulcer Appendicitis ,Inguinal Hernia, Diverticulitis, Bowel obstruction Shingles, Lower lobe pneumonia, Retroperitoneal hematoma/abscess/tumor, Epidural abscess, Epidural hematoma    Co morbidities that complicate the patient evaluation   OSA, bronchitis, anemia, CHF, arthritis, umbilical hernia  My initial workup includes abdominal pain labs, CT abdomen pelvis, IV fluids  Additional history obtained from: Nursing notes from this visit.  I ordered, reviewed and interpreted labs which include: CBC, CMP, lipase, urinalysis.  All labs within normal limits.   I ordered imaging studies including CT abdomen pelvis I independently  visualized and interpreted imaging which showed normal I agree with the radiologist interpretation  Afebrile, hemodynamically stable.  Patient is a 66 year old female who is presenting to the ED for evaluation of pruritic urination as well as frequency and urgency.  Patient was started on Macrobid by her PCP 2 days ago but reported no change in her symptoms which prompted her to present to the ED today.  Does also have mild right-sided back pain with radiation to the right flank and right lower quadrant.  Physical exam remarkable for suprapubic tenderness, was otherwise unremarkable.  Lab work-up unremarkable.  CT abdomen pelvis negative.  I have low suspicion for acute intra-abdominal or urologic emergencies. patient complaining mostly of pruritus.  We will give patient first dose of fluconazole in the ED with a prescription for second dose to be taken 72 hours from now in order to treat for possible yeast infection.  Encourage patient to follow-up with her primary care provider within the next week for reassessment.  Also encourage patient to drink plenty of fluids over the next couple days.  Gave patient strict return precautions.  Stable at discharge.  At this  time there does not appear to be any evidence of an acute emergency medical condition and the patient appears stable for discharge with appropriate outpatient follow up. Diagnosis was discussed with patient who verbalizes understanding of care plan and is agreeable to discharge. I have discussed return precautions with patient who verbalizes understanding. Patient encouraged to follow-up with their PCP within 1 week. All questions answered.  Patient's case discussed with Dr. Oswald Hillock who agrees with plan to discharge with follow-up.   Note: Portions of this report may have been transcribed using voice recognition software. Every effort was made to ensure accuracy; however, inadvertent computerized transcription errors may still be present.          Final Clinical Impression(s) / ED Diagnoses Final diagnoses:  Dysuria  Right lower quadrant abdominal pain    Rx / DC Orders ED Discharge Orders          Ordered    fluconazole (DIFLUCAN) 150 MG tablet  Daily       Note to Pharmacy: Take one dose on 06/22/2022   06/19/22 1915              Nehemiah Massed 06/19/22 1917    Tretha Sciara, MD 06/21/22 1513

## 2022-08-11 ENCOUNTER — Encounter: Payer: Self-pay | Admitting: Obstetrics and Gynecology

## 2022-08-11 ENCOUNTER — Other Ambulatory Visit (HOSPITAL_COMMUNITY)
Admission: RE | Admit: 2022-08-11 | Discharge: 2022-08-11 | Disposition: A | Discharge status: Home / Self Care | Payer: 59 | Source: Ambulatory Visit | Attending: Obstetrics and Gynecology | Admitting: Obstetrics and Gynecology

## 2022-08-11 ENCOUNTER — Ambulatory Visit (INDEPENDENT_AMBULATORY_CARE_PROVIDER_SITE_OTHER): Payer: 59 | Admitting: Obstetrics and Gynecology

## 2022-08-11 VITALS — BP 124/69 | HR 79 | Ht 64.0 in | Wt 199.0 lb

## 2022-08-11 DIAGNOSIS — Z1231 Encounter for screening mammogram for malignant neoplasm of breast: Secondary | ICD-10-CM | POA: Diagnosis not present

## 2022-08-11 DIAGNOSIS — N898 Other specified noninflammatory disorders of vagina: Secondary | ICD-10-CM | POA: Diagnosis present

## 2022-08-11 DIAGNOSIS — L989 Disorder of the skin and subcutaneous tissue, unspecified: Secondary | ICD-10-CM | POA: Diagnosis not present

## 2022-08-11 NOTE — Progress Notes (Unsigned)
  GYNECOLOGY PROGRESS NOTE  History:  Ms. Tiffany Coleman is a 66 y.o. [id number] presents to [facility]*** office today for problem gyn visit. She reports ***.  She denies h/a, dizziness, shortness of breath, n/v, or fever/chills.    The following portions of the patient's history were reviewed and updated as appropriate: allergies, current medications, past family history, past medical history, past social history, past surgical history and problem list. Last pap smear on *** was normal, *** HRHPV.  Review of Systems:  Pertinent items are noted in HPI.   Objective:  Physical Exam Blood pressure 124/69, pulse 79, height _0  (1.626 m), weight 199 lb (90.3 kg). VS reviewed, nursing note reviewed,  Constitutional: well developed, well nourished, no distress HEENT: normocephalic CV: normal rate Pulm/chest wall: normal effort Breast Exam: deferred Abdomen: soft Neuro: alert and oriented x 3 Skin: warm, dry Psych: affect normal Pelvic exam: Cervix pink, visually closed, without lesion, scant white creamy discharge, vaginal walls and external genitalia normal Bimanual exam: Cervix 0/long/high, firm, anterior, neg CMT, uterus nontender, nonenlarged, adnexa without tenderness, enlargement, or mass  Assessment & Plan:  There are no diagnoses linked to this encounter.  Laury Deep, CNM 10:29 AM

## 2022-08-11 NOTE — Progress Notes (Unsigned)
NGYN pt reports vaginal itching and painless "knot" inside pubic area.  STD testing offered; pt agrees to vaginal STD testing only, no blood work.  Mammogram 04/25/2019 Colonoscopy 05/30/27 Bone Density "Never"

## 2022-08-15 LAB — CERVICOVAGINAL ANCILLARY ONLY
Bacterial Vaginitis (gardnerella): NEGATIVE
Candida Glabrata: NEGATIVE
Candida Vaginitis: NEGATIVE
Chlamydia: NEGATIVE
Comment: NEGATIVE
Comment: NEGATIVE
Comment: NEGATIVE
Comment: NEGATIVE
Comment: NEGATIVE
Comment: NORMAL
Neisseria Gonorrhea: NEGATIVE
Trichomonas: NEGATIVE

## 2022-08-18 ENCOUNTER — Ambulatory Visit (HOSPITAL_BASED_OUTPATIENT_CLINIC_OR_DEPARTMENT_OTHER): Payer: 59 | Admitting: Radiology

## 2022-08-24 ENCOUNTER — Ambulatory Visit (HOSPITAL_BASED_OUTPATIENT_CLINIC_OR_DEPARTMENT_OTHER): Admission: RE | Admit: 2022-08-24 | Payer: Medicaid Other | Source: Ambulatory Visit | Admitting: Radiology

## 2022-08-25 ENCOUNTER — Telehealth: Payer: Self-pay

## 2022-08-25 NOTE — Telephone Encounter (Signed)
Returned call and advised of results

## 2022-08-30 ENCOUNTER — Ambulatory Visit: Payer: 59 | Admitting: Obstetrics and Gynecology

## 2022-09-01 ENCOUNTER — Ambulatory Visit (HOSPITAL_BASED_OUTPATIENT_CLINIC_OR_DEPARTMENT_OTHER): Payer: Medicaid Other | Admitting: Radiology

## 2022-09-05 ENCOUNTER — Ambulatory Visit (HOSPITAL_BASED_OUTPATIENT_CLINIC_OR_DEPARTMENT_OTHER)
Admission: RE | Admit: 2022-09-05 | Discharge: 2022-09-05 | Disposition: A | Discharge status: Home / Self Care | Payer: 59 | Source: Ambulatory Visit | Attending: Obstetrics and Gynecology | Admitting: Obstetrics and Gynecology

## 2022-09-05 DIAGNOSIS — Z1231 Encounter for screening mammogram for malignant neoplasm of breast: Secondary | ICD-10-CM | POA: Diagnosis not present

## 2022-09-20 ENCOUNTER — Ambulatory Visit
Admission: RE | Admit: 2022-09-20 | Discharge: 2022-09-20 | Disposition: A | Discharge status: Home / Self Care | Payer: 59 | Source: Ambulatory Visit | Attending: Pulmonary Disease | Admitting: Pulmonary Disease

## 2022-09-20 DIAGNOSIS — J849 Interstitial pulmonary disease, unspecified: Secondary | ICD-10-CM

## 2022-10-04 ENCOUNTER — Encounter: Payer: Self-pay | Admitting: Pulmonary Disease

## 2022-10-04 ENCOUNTER — Ambulatory Visit (INDEPENDENT_AMBULATORY_CARE_PROVIDER_SITE_OTHER): Payer: 59 | Admitting: Pulmonary Disease

## 2022-10-04 VITALS — BP 116/64 | HR 80 | Ht 63.0 in | Wt 193.0 lb

## 2022-10-04 DIAGNOSIS — R0602 Shortness of breath: Secondary | ICD-10-CM | POA: Diagnosis not present

## 2022-10-04 LAB — CBC WITH DIFFERENTIAL/PLATELET
Basophils Absolute: 0.1 10*3/uL (ref 0.0–0.1)
Basophils Relative: 0.6 % (ref 0.0–3.0)
Eosinophils Absolute: 0.2 10*3/uL (ref 0.0–0.7)
Eosinophils Relative: 2.3 % (ref 0.0–5.0)
HCT: 35.3 % — ABNORMAL LOW (ref 36.0–46.0)
Hemoglobin: 11.4 g/dL — ABNORMAL LOW (ref 12.0–15.0)
Lymphocytes Relative: 15.7 % (ref 12.0–46.0)
Lymphs Abs: 1.3 10*3/uL (ref 0.7–4.0)
MCHC: 32.4 g/dL (ref 30.0–36.0)
MCV: 89.6 fl (ref 78.0–100.0)
Monocytes Absolute: 0.7 10*3/uL (ref 0.1–1.0)
Monocytes Relative: 8.7 % (ref 3.0–12.0)
Neutro Abs: 5.8 10*3/uL (ref 1.4–7.7)
Neutrophils Relative %: 72.7 % (ref 43.0–77.0)
Platelets: 295 10*3/uL (ref 150.0–400.0)
RBC: 3.94 Mil/uL (ref 3.87–5.11)
RDW: 15.2 % (ref 11.5–15.5)
WBC: 8 10*3/uL (ref 4.0–10.5)

## 2022-10-04 MED ORDER — PREDNISONE 2.5 MG PO TABS: ORAL_TABLET | ORAL | 0 refills | Status: DC

## 2022-10-04 NOTE — Progress Notes (Deleted)
Tiffany Coleman    MT:[id number]    [birth year]  Primary Care Physician:Avbuere, Christean Grief, MD  Referring Physician: Nolene Ebbs, [address] [place],  [place] [id number]  Chief complaint: Follow-up for asthma mild, post COVID-10  HPI: 67 year old with history of asthma, morbid obesity, OSA  Recent hospitalization for COVID-19 infection with prolonged hospital stay and discharged on 05/11/20 She was readmitted back to [facility] long hospital on 10/13 with hypoxic respiratory failure.  No PE on CTA but imaging shows extensive bilateral infiltrates. 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 Aundra Dubin in November 2022 with mild pulmonary venous hypertension.  She is on Lasix for diuresis   Started on azathioprine as she was unable to come off steroids in spite of multiple attempts at taper.  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 No mold, hot tub, Jacuzzi Smoking history: Never smoker Travel history: Originally from [place]. No significant recent travel Relevant family history: No significant family issue of lung disease.  Interim history: Dose of azithromycin was increased to 150 mg in January 2023.  She continues on a slow prednisone taper.  Currently at 5 mg/day  She developed COVID again in early August and was treated with Paxlovid.  Overall she is actually feeling better and losing weight.  States that breathing is improved.  Continues on supplemental oxygen  Outpatient Encounter Medications as of 10/04/2022  Medication Sig   albuterol (2.5 MG/3ML) 0.083% NEBU 3 mL, albuterol (5 MG/ML) 0.5% NEBU 0.5 mL Inhale 5 mg into the lungs once a week.   albuterol (VENTOLIN HFA) 108 (90 Base)  MCG/ACT inhaler Inhale 2 puffs into the lungs every 6 (six) hours as needed for wheezing or shortness of breath.   azaTHIOprine (IMURAN) 50 MG tablet Take 3 tablets (150 mg total) by mouth daily. Take '150mg'$  daily   diclofenac Sodium (VOLTAREN) 1 % GEL SMARTSIG:4 Gram(s) Topical 4 Times Daily PRN   fluticasone (FLONASE) 50 MCG/ACT nasal spray Place 2 sprays into both nostrils daily. (Patient taking differently: Place 2 sprays into both nostrils daily as needed for rhinitis.)   furosemide (LASIX) 20 MG tablet Take 1 tablet (20 mg total) by mouth 2 (two) times daily. NEEDS FOLLOW UP APPOINTMENT FOR MORE REFILLS   hydrOXYzine (ATARAX) 25 MG tablet Take 25 mg by mouth 2 (two) times daily as needed. for anxiety   ibuprofen (ADVIL) 800 MG tablet Take 1 tablet (800 mg total) by mouth 3 (three) times daily.   ipratropium-albuterol (DUONEB) 0.5-2.5 (3) MG/3ML SOLN Take 3 mLs by nebulization every 6 (six) hours as needed.   MOUNJARO 2.5 MG/0.5ML Pen SMARTSIG:2.5 Milligram(s) SUB-Q Once a Week   pantoprazole (PROTONIX) 40 MG tablet Take 1 tablet (40 mg total) by mouth daily at 6 (six) AM.   potassium chloride (KLOR-CON M) 10 MEQ tablet Take 1 tablet (10 mEq total) by mouth daily. Absolute last refill without office visit please call [phone] to schedule   promethazine-dextromethorphan (PROMETHAZINE-DM) 6.25-15 MG/5ML syrup Take 10 mLs by mouth 2 (two) times daily as needed for cough.   SYMBICORT 160-4.5 MCG/ACT inhaler SMARTSIG:2 Puff(s) By Mouth Twice Daily   zolpidem (AMBIEN) 10 MG tablet Take 10 mg by  mouth at bedtime as needed for sleep.   acetaminophen (TYLENOL) 325 MG tablet Take 2 tablets (650 mg total) by mouth every 6 (six) hours as needed for mild pain or headache (fever >/= 101). (Patient not taking: Reported on 08/11/2022)   predniSONE (DELTASONE) 5 MG tablet Take 5 mg by mouth daily with breakfast.   No facility-administered encounter medications on file as of 10/04/2022.    Physical  Exam: Blood pressure 130/70, pulse 67, temperature 98.3 F (36.8 C), temperature source Oral, height '5\' 3"'$  (1.6 m), weight 216 lb 6.4 oz (98.2 kg), SpO2 99 %. Gen:      No acute distress HEENT:  EOMI, sclera anicteric Neck:     No masses; no thyromegaly Lungs:    Clear to auscultation bilaterally; normal respiratory effort CV:         Regular rate and rhythm; no murmurs Abd:      + bowel sounds; soft, non-tender; no palpable masses, no distension Ext:    No edema; adequate peripheral perfusion Skin:      Warm and dry; no rash Neuro: alert and oriented x 3 Psych: normal mood and affect   Data Reviewed: Imaging: CTA 05/26/2020-no PE, diffuse bilateral interstitial infiltrates and groundglass opacities.  Overall improved compared to prior CT. Chest x-ray 07/13/2020 -persistent bilateral pulmonary infiltrates.  I have reviewed the images personally. High-res CT 10/01/2020-improvement in groundglass opacities with development of fibrotic changes consistent with evolving post-COVID ILD.  CTA 03/01/2021-no pulmonary embolism, stable diffuse interstitial opacities. High-res CT 11/24/2021-stable pattern of groundglass and fibrotic changes. I have reviewed the images personally.  PFTs: 08/31/2020 FVC 1.35 [50%], FEV1 1.28 [69%], F/F 95, TLC 1.99 [40%], DLCO 8.42 [44%] Severe restriction, diffusion defect  Labs: CBC 07/13/2020-WBC 10.8, eos 0.1%, absolute eosinophil count 11 CMP 07/13/2020-normal except for slight increase in bicarb and glucose  CTD serologies 02/08/2021- 1:320, cytoplasmic  Cardiac: Echocardiogram 04/24/2020-LVEF 60 to 65%, moderate concentric LVH, RV systolic size and function are normal.  RHC 05/20/2021 1. Mildly elevated right-sided filling pressures.  2. Mild pulmonary venous hypertension.  3. Normal PCWP.   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  High-res CT reviewed  with stable of fibrotic changes consistent with evolving post-COVID ILD.  CTD serologies show nonspecific elevation in ANA  Since we are unable to come off prednisone we are attempting azathioprine Continue azathioprine to 150 mg a day and slow steroid taper.  Reduce by 2.5 mg every 2 weeks Referred to pulmonary rehab again.  She could not complete last year due to some desats but is in a better position now Continue supplemental oxygen.    Check CBC and CMP for monitoring  Asthma She has history of asthma but no obstruction on PFTs.  She has not tolerated Symbicort due to thrush Continue duo nebs, albuterol as needed  HFpEF Echo reviewed with EF 60-65%, LVH Right heart cath with no evidence of pulmonary arterial hypertension Continue Lasix Cardiology follow-up  OSA Intolerant of CPAP and is not interested in retrying Split-night sleep study ordered last year but patient canceled as she just had her hair done and did not want to disturb it  Plan/Recommendations: Continue azathioprine to 150 mg Slow prednisone taper Continue supplemental oxygen  Marshell Garfinkel MD [facility] 10/04/2022, 11:13 AM  CC: Nolene Ebbs, MD

## 2022-10-04 NOTE — Patient Instructions (Signed)
Continue supplemental oxygen for now Continue azathioprine Reduce prednisone to 2.5 mg a day for 2 weeks and then take 2.5 mg every other day for 2 weeks.  If he is stable at this dose then he can stop the prednisone Follow-up in 6 months

## 2022-10-04 NOTE — Addendum Note (Signed)
Addended by: Elton Sin on: 10/04/2022 11:36 AM   Modules accepted: Orders

## 2022-10-04 NOTE — Progress Notes (Addendum)
Tiffany Coleman    [id number]    [birth year]  Primary Care Physician:Avbuere, Christean Grief, MD  Referring Physician: Nolene Ebbs, [place],  [place] [address]  Chief complaint:  Follow-up for asthma mild, post COVID-19 Started azathioprine in June 2022  HPI: 67 y.o.  with history of asthma, morbid obesity, OSA  Recent hospitalization for COVID-19 infection with prolonged hospital stay and discharged on 05/11/20 She was readmitted back to [facility] long hospital on 10/13 with hypoxic respiratory failure.  No PE on CTA but imaging shows extensive bilateral infiltrates. 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 Aundra Dubin in November 2022 with mild pulmonary venous hypertension.  She is on Lasix for diuresis   Started on azathioprine in June 2022 as she was unable to come off steroids in spite of multiple attempts at taper. She developed COVID again in early August 2023 and was treated with Paxlovid.    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 No mold, hot tub, Jacuzzi Smoking history: Never smoker Travel history: Originally from [place]. No significant recent travel Relevant family history: No significant family issue of lung disease.  Interim history: Dose of azithromycin was increased to 150 mg in January 2023.  She continues on a slow prednisone taper.  Currently at 5 mg/day She tried going down further on prednisone in late 2023 but developed some congestion and dyspnea and maintain at 5 mg a day  Overall she is actually feeling better and losing weight.  States that breathing is improved.  Continues on supplemental oxygen  Outpatient Encounter Medications as of 10/04/2022   Medication Sig   albuterol (2.5 MG/3ML) 0.083% NEBU 3 mL, albuterol (5 MG/ML) 0.5% NEBU 0.5 mL Inhale 5 mg into the lungs once a week.   albuterol (VENTOLIN HFA) 108 (90 Base) MCG/ACT inhaler Inhale 2 puffs into the lungs every 6 (six) hours as needed for wheezing or shortness of breath.   azaTHIOprine (IMURAN) 50 MG tablet Take 3 tablets (150 mg total) by mouth daily. Take '150mg'$  daily   diclofenac Sodium (VOLTAREN) 1 % GEL SMARTSIG:4 Gram(s) Topical 4 Times Daily PRN   fluticasone (FLONASE) 50 MCG/ACT nasal spray Place 2 sprays into both nostrils daily. (Patient taking differently: Place 2 sprays into both nostrils daily as needed for rhinitis.)   furosemide (LASIX) 20 MG tablet Take 1 tablet (20 mg total) by mouth 2 (two) times daily. NEEDS FOLLOW UP APPOINTMENT FOR MORE REFILLS   hydrOXYzine (ATARAX) 25 MG tablet Take 25 mg by mouth 2 (two) times daily as needed. for anxiety   ibuprofen (ADVIL) 800 MG tablet Take 1 tablet (800 mg total) by mouth 3 (three) times daily.   ipratropium-albuterol (DUONEB) 0.5-2.5 (3) MG/3ML SOLN Take 3 mLs by nebulization every 6 (six) hours as needed.   MOUNJARO 2.5 MG/0.5ML Pen SMARTSIG:2.5 Milligram(s) SUB-Q Once a Week   pantoprazole (PROTONIX) 40 MG tablet Take 1 tablet (40 mg total) by mouth daily at 6 (six) AM.   potassium chloride (KLOR-CON M) 10 MEQ tablet Take 1 tablet (10 mEq total) by mouth daily. Absolute last refill without office visit please call [phone] to schedule   promethazine-dextromethorphan (PROMETHAZINE-DM) 6.25-15 MG/5ML syrup  Take 10 mLs by mouth 2 (two) times daily as needed for cough.   SYMBICORT 160-4.5 MCG/ACT inhaler SMARTSIG:2 Puff(s) By Mouth Twice Daily   zolpidem (AMBIEN) 10 MG tablet Take 10 mg by mouth at bedtime as needed for sleep.   acetaminophen (TYLENOL) 325 MG tablet Take 2 tablets (650 mg total) by mouth every 6 (six) hours as needed for mild pain or headache (fever >/= 101). (Patient not taking: Reported on  08/11/2022)   predniSONE (DELTASONE) 5 MG tablet Take 5 mg by mouth daily with breakfast.   No facility-administered encounter medications on file as of 10/04/2022.    Physical Exam: Blood pressure 116/64, pulse 80, height '5\' 3"'$  (1.6 m), weight 193 lb (87.5 kg), SpO2 98 %. Gen:      No acute distress HEENT:  EOMI, sclera anicteric Neck:     No masses; no thyromegaly Lungs:    Clear to auscultation bilaterally; normal respiratory effort CV:         Regular rate and rhythm; no murmurs Abd:      + bowel sounds; soft, non-tender; no palpable masses, no distension Ext:    No edema; adequate peripheral perfusion Skin:      Warm and dry; no rash Neuro: alert and oriented x 3 Psych: normal mood and affect   Data Reviewed: Imaging: CTA 05/26/2020-no PE, diffuse bilateral interstitial infiltrates and groundglass opacities.  Overall improved compared to prior CT. Chest x-ray 07/13/2020 -persistent bilateral pulmonary infiltrates.  I have reviewed the images personally. High-res CT 10/01/2020-improvement in groundglass opacities with development of fibrotic changes consistent with evolving post-COVID ILD.  CTA 03/01/2021-no pulmonary embolism, stable diffuse interstitial opacities. High-res CT 11/24/2021-stable pattern of groundglass and fibrotic changes. High-resolution CT 09/20/2022-stable pattern of groundglass, fibrotic changes consistent with post-COVID ILD I have reviewed the images personally.  PFTs: 08/31/2020 FVC 1.35 [50%], FEV1 1.28 [69%], F/F 95, TLC 1.99 [40%], DLCO 8.42 [44%] Severe restriction, diffusion defect  Labs: CBC 07/13/2020-WBC 10.8, eos 0.1%, absolute eosinophil count 11 CMP 07/13/2020-normal except for slight increase in bicarb and glucose  CTD serologies 02/08/2021- 1:320, cytoplasmic  Cardiac: Echocardiogram 04/24/2020-LVEF 60 to 65%, moderate concentric LVH, RV systolic size and function are normal.  RHC 05/20/2021 1. Mildly elevated right-sided filling pressures.   2. Mild pulmonary venous hypertension.  3. Normal PCWP.   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  High-res CT reviewed with stable of fibrotic changes consistent with evolving post-COVID ILD.  CTD serologies show nonspecific elevation in ANA  Since we are unable to come off prednisone we are attempting azathioprine Continue azathioprine to 150 mg a day and slow steroid taper.  Check CBC for monitoring Reduce prednisone to 2.5 mg a day for 2 weeks and then take 2.5 mg every other day for 2 weeks.  If he is stable at this dose then she can stop the prednisone  Finished pulmonary rehab.  She is on supplemental oxygen at 1 L and had mild desats on exertion today so we will continue the same.  Asthma She has history of asthma but no obstruction on PFTs.  She has not tolerated Symbicort due to thrush Continue duo nebs, albuterol as needed  HFpEF Echo reviewed with EF 60-65%, LVH Right heart cath with no evidence of pulmonary arterial hypertension Continue Lasix Cardiology follow-up  OSA Intolerant of CPAP and is not interested in retrying Split-night sleep study ordered last year but patient  canceled as she just had her hair done and did not want to disturb it  Plan/Recommendations: Continue azathioprine to 150 mg Slow prednisone taper Check CBC Continue supplemental oxygen  Marshell Garfinkel MD [facility] 10/04/2022, 11:08 AM  CC: Nolene Ebbs, MD

## 2022-10-11 ENCOUNTER — Other Ambulatory Visit: Payer: Self-pay | Admitting: Internal Medicine

## 2022-10-12 LAB — CBC
HCT: 33.4 % — ABNORMAL LOW (ref 35.0–45.0)
Hemoglobin: 11 g/dL — ABNORMAL LOW (ref 11.7–15.5)
MCH: 28.9 pg (ref 27.0–33.0)
MCHC: 32.9 g/dL (ref 32.0–36.0)
MCV: 87.7 fL (ref 80.0–100.0)
MPV: 10.8 fL (ref 7.5–12.5)
Platelets: 287 10*3/uL (ref 140–400)
RBC: 3.81 10*6/uL (ref 3.80–5.10)
RDW: 13.4 % (ref 11.0–15.0)
WBC: 8.4 10*3/uL (ref 3.8–10.8)

## 2022-10-12 LAB — TSH: TSH: 2.27 mIU/L (ref 0.40–4.50)

## 2022-10-12 LAB — COMPLETE METABOLIC PANEL WITH GFR
AG Ratio: 1.5 (calc) (ref 1.0–2.5)
ALT: 9 U/L (ref 6–29)
AST: 13 U/L (ref 10–35)
Albumin: 3.9 g/dL (ref 3.6–5.1)
Alkaline phosphatase (APISO): 62 U/L (ref 37–153)
BUN: 17 mg/dL (ref 7–25)
CO2: 25 mmol/L (ref 20–32)
Calcium: 9.4 mg/dL (ref 8.6–10.4)
Chloride: 104 mmol/L (ref 98–110)
Creat: 0.98 mg/dL (ref 0.50–1.05)
Globulin: 2.6 g/dL (calc) (ref 1.9–3.7)
Glucose, Bld: 80 mg/dL (ref 65–99)
Potassium: 4 mmol/L (ref 3.5–5.3)
Sodium: 141 mmol/L (ref 135–146)
Total Bilirubin: 0.4 mg/dL (ref 0.2–1.2)
Total Protein: 6.5 g/dL (ref 6.1–8.1)
eGFR: 63 mL/min/{1.73_m2} (ref >=60)

## 2022-10-12 LAB — LIPID PANEL
Cholesterol: 176 mg/dL (ref <200)
HDL: 58 mg/dL (ref >=50)
LDL Cholesterol (Calc): 98 mg/dL (calc)
Non-HDL Cholesterol (Calc): 118 mg/dL (calc) (ref <130)
Total CHOL/HDL Ratio: 3 (calc) (ref <5.0)
Triglycerides: 105 mg/dL (ref <150)

## 2022-10-12 LAB — URIC ACID: Uric Acid, Serum: 4.8 mg/dL (ref 2.5–7.0)

## 2022-10-12 LAB — VITAMIN D 25 HYDROXY (VIT D DEFICIENCY, FRACTURES): Vit D, 25-Hydroxy: 24 ng/mL — ABNORMAL LOW (ref 30–100)

## 2022-10-31 ENCOUNTER — Other Ambulatory Visit: Payer: Self-pay | Admitting: Pulmonary Disease

## 2022-11-19 ENCOUNTER — Other Ambulatory Visit: Payer: Self-pay | Admitting: Pulmonary Disease

## 2023-04-17 ENCOUNTER — Encounter: Payer: Self-pay | Admitting: Pulmonary Disease

## 2023-04-17 ENCOUNTER — Other Ambulatory Visit: Payer: Self-pay | Admitting: Gastroenterology

## 2023-04-17 ENCOUNTER — Ambulatory Visit (INDEPENDENT_AMBULATORY_CARE_PROVIDER_SITE_OTHER): Payer: 59 | Admitting: Pulmonary Disease

## 2023-04-17 ENCOUNTER — Ambulatory Visit (INDEPENDENT_AMBULATORY_CARE_PROVIDER_SITE_OTHER): Payer: 59

## 2023-04-17 VITALS — BP 124/76 | HR 77 | Temp 98.1°F | Ht 63.0 in | Wt 185.0 lb

## 2023-04-17 DIAGNOSIS — J849 Interstitial pulmonary disease, unspecified: Secondary | ICD-10-CM

## 2023-04-17 DIAGNOSIS — Z5181 Encounter for therapeutic drug level monitoring: Secondary | ICD-10-CM | POA: Diagnosis not present

## 2023-04-17 NOTE — Progress Notes (Signed)
Tiffany Coleman    [id number]    Feb 21, 1956  Primary Care Physician:Avbuere, Dorma Russell, MD  Referring Physician: Fleet Contras, MD [address] [place],  [place] [address]  Chief complaint:  Follow-up for asthma mild, post COVID-19 Started azathioprine in June 2022  HPI: 67 y.o.  with history of asthma, morbid obesity, OSA  Recent hospitalization for COVID-19 infection with prolonged hospital stay and discharged on 05/11/20 She was readmitted back to [facility] long hospital on 10/13 with hypoxic respiratory failure.  No PE on CTA but imaging shows extensive bilateral infiltrates. 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 Shirlee Latch in November 2022 with mild pulmonary venous hypertension.  She is on Lasix for diuresis   Started on azathioprine in June 2022 as she was unable to come off steroids in spite of multiple attempts at taper. She developed COVID again in early August 2023 and was treated with Paxlovid.    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 No mold, hot tub, Jacuzzi Smoking history: Never smoker Travel history: Originally from [place]. No significant recent travel Relevant family history: No significant family issue of lung disease.  Interim history: Dose of azithromycin was increased to 150 mg in January 2023.  She tried going down further on prednisone in late 2023 but developed some congestion and dyspnea   She attempted prednisone taper again and is now using it intermittently Complains of intermittent right chest pain.  Outpatient Encounter Medications as of 04/17/2023  Medication Sig   acetaminophen (TYLENOL) 325 MG tablet Take 2 tablets (650 mg total) by mouth every 6  (six) hours as needed for mild pain or headache (fever >/= 101).   albuterol (2.5 MG/3ML) 0.083% NEBU 3 mL, albuterol (5 MG/ML) 0.5% NEBU 0.5 mL Inhale 5 mg into the lungs once a week.   albuterol (VENTOLIN HFA) 108 (90 Base) MCG/ACT inhaler Inhale 2 puffs into the lungs every 6 (six) hours as needed for wheezing or shortness of breath.   azaTHIOprine (IMURAN) 50 MG tablet TAKE 3 TABLETS(150 MG) BY MOUTH DAILY   diclofenac Sodium (VOLTAREN) 1 % GEL SMARTSIG:4 Gram(s) Topical 4 Times Daily PRN   fluticasone (FLONASE) 50 MCG/ACT nasal spray Place 2 sprays into both nostrils daily. (Patient taking differently: Place 2 sprays into both nostrils daily as needed for rhinitis.)   furosemide (LASIX) 20 MG tablet Take 1 tablet (20 mg total) by mouth 2 (two) times daily. NEEDS FOLLOW UP APPOINTMENT FOR MORE REFILLS   hydrOXYzine (ATARAX) 25 MG tablet Take 25 mg by mouth 2 (two) times daily as needed. for anxiety   ibuprofen (ADVIL) 800 MG tablet Take 1 tablet (800 mg total) by mouth 3 (three) times daily.   ipratropium-albuterol (DUONEB) 0.5-2.5 (3) MG/3ML SOLN Take 3 mLs by nebulization every 6 (six) hours as needed.   MOUNJARO 2.5 MG/0.5ML Pen SMARTSIG:2.5 Milligram(s) SUB-Q Once a Week   pantoprazole (PROTONIX) 40 MG tablet Take 1 tablet (40 mg total) by mouth daily at 6 (six) AM.   potassium chloride (KLOR-CON M) 10 MEQ tablet Take 1 tablet (10 mEq total) by mouth daily. Absolute last refill without office visit please call [phone] to schedule   predniSONE (DELTASONE)  2.5 MG tablet Take 2.5mg  daily x's 2 weeks then 2.5mg  every other day x's 2 weeks   promethazine-dextromethorphan (PROMETHAZINE-DM) 6.25-15 MG/5ML syrup Take 10 mLs by mouth 2 (two) times daily as needed for cough.   SYMBICORT 160-4.5 MCG/ACT inhaler SMARTSIG:2 Puff(s) By Mouth Twice Daily   zolpidem (AMBIEN) 10 MG tablet Take 10 mg by mouth at bedtime as needed for sleep.   No facility-administered encounter medications on file as of  04/17/2023.    Physical Exam: Blood pressure 124/76, pulse 77, temperature 98.1 F (36.7 C), temperature source Oral, height 5\' 3"  (1.6 m), weight 185 lb (83.9 kg), SpO2 100%. Gen:      No acute distress HEENT:  EOMI, sclera anicteric Neck:     No masses; no thyromegaly Lungs:    Clear to auscultation bilaterally; normal respiratory effort CV:         Regular rate and rhythm; no murmurs Abd:      + bowel sounds; soft, non-tender; no palpable masses, no distension Ext:    No edema; adequate peripheral perfusion Skin:      Warm and dry; no rash Neuro: alert and oriented x 3 Psych: normal mood and affect   Data Reviewed: Imaging: CTA 05/26/2020-no PE, diffuse bilateral interstitial infiltrates and groundglass opacities.  Overall improved compared to prior CT. Chest x-ray 07/13/2020 -persistent bilateral pulmonary infiltrates.  I have reviewed the images personally. High-res CT 10/01/2020-improvement in groundglass opacities with development of fibrotic changes consistent with evolving post-COVID ILD.  CTA 03/01/2021-no pulmonary embolism, stable diffuse interstitial opacities. High-res CT 11/24/2021-stable pattern of groundglass and fibrotic changes. High-resolution CT 09/20/2022-stable pattern of groundglass, fibrotic changes consistent with post-COVID ILD I have reviewed the images personally.  PFTs: 08/31/2020 FVC 1.35 [50%], FEV1 1.28 [69%], F/F 95, TLC 1.99 [40%], DLCO 8.42 [44%] Severe restriction, diffusion defect  Labs: CBC 07/13/2020-WBC 10.8, eos 0.1%, absolute eosinophil count 11 CMP 07/13/2020-normal except for slight increase in bicarb and glucose  CTD serologies 02/08/2021- 1:320, cytoplasmic  Cardiac: Echocardiogram 04/24/2020-LVEF 60 to 65%, moderate concentric LVH, RV systolic size and function are normal.  RHC 05/20/2021 1. Mildly elevated right-sided filling pressures.  2. Mild pulmonary venous hypertension.  3. Normal PCWP.   Assessment:  Post Covid  pneumonitis Currently on intermittent prednisone She has been steroid responsive with flares whenever she tapers prednisone.  High-res CT reviewed with stable of fibrotic changes consistent with evolving post-COVID ILD.  CTD serologies show nonspecific elevation in ANA  Since we are unable to come off prednisone we put on prednisone Continue azathioprine to 150 mg a day and slow steroid taper.  Check checks chest x-ray as she has some right chest pain Check labs for monitoring  Finished pulmonary rehab.  She is on supplemental oxygen at 1 L and had mild desats on exertion today so we will continue the same.  Asthma She has history of asthma but no obstruction on PFTs.  She has not tolerated Symbicort due to thrush Continue duo nebs, albuterol as needed  HFpEF Echo reviewed with EF 60-65%, LVH Right heart cath with no evidence of pulmonary arterial hypertension Continue Lasix Cardiology follow-up  OSA Intolerant of CPAP and is not interested in retrying Split-night sleep study ordered last year but patient canceled as she just had her hair done and did not want to disturb it  Plan/Recommendations: Continue azathioprine to 150 mg Check CBC, CMP, chest x-ray Continue supplemental oxygen  Chilton Greathouse MD [facility] 04/17/2023, 11:19 AM  CC:  Fleet Contras, MD

## 2023-04-17 NOTE — Progress Notes (Signed)
Tiffany Coleman    [id number]    26-Feb-1956  Primary Care Physician:Avbuere, Dorma Russell, MD  Referring Physician: Fleet Contras, MD [address] [place],  [place] [address]  Chief complaint:  Follow-up for asthma mild, post COVID-19 Started azathioprine in June 2022  HPI: 67 y.o.  with history of asthma, morbid obesity, OSA  Recent hospitalization for COVID-19 infection with prolonged hospital stay and discharged on 05/11/20 She was readmitted back to [facility] hospital on 10/13 with hypoxic respiratory failure.  No PE on CTA but imaging shows extensive bilateral infiltrates. 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 Shirlee Latch in November 2022 with mild pulmonary venous hypertension.  She is on Lasix for diuresis   Started on azathioprine in June 2022 as she was unable to come off steroids in spite of multiple attempts at taper. She developed COVID again in early August 2023 and was treated with Paxlovid.    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 No mold, hot tub, Jacuzzi Smoking history: Never smoker Travel history: Originally from [place]. No significant recent travel Relevant family history: No significant family issue of lung disease.  Interim history: Dose of azithromycin was increased to 150 mg in January 2023.  She continues on a slow prednisone taper.  Currently at 5 mg/day She tried going down further on prednisone in late 2023 but developed some congestion and dyspnea and maintain at 5 mg a day  Overall she is actually feeling better and losing weight.  States that breathing is improved.  Continues on supplemental oxygen  Outpatient Encounter Medications as of 04/17/2023   Medication Sig   acetaminophen (TYLENOL) 325 MG tablet Take 2 tablets (650 mg total) by mouth every 6 (six) hours as needed for mild pain or headache (fever >/= 101).   albuterol (2.5 MG/3ML) 0.083% NEBU 3 mL, albuterol (5 MG/ML) 0.5% NEBU 0.5 mL Inhale 5 mg into the lungs once a week.   albuterol (VENTOLIN HFA) 108 (90 Base) MCG/ACT inhaler Inhale 2 puffs into the lungs every 6 (six) hours as needed for wheezing or shortness of breath.   azaTHIOprine (IMURAN) 50 MG tablet TAKE 3 TABLETS(150 MG) BY MOUTH DAILY   diclofenac Sodium (VOLTAREN) 1 % GEL SMARTSIG:4 Gram(s) Topical 4 Times Daily PRN   fluticasone (FLONASE) 50 MCG/ACT nasal spray Place 2 sprays into both nostrils daily. (Patient taking differently: Place 2 sprays into both nostrils daily as needed for rhinitis.)   furosemide (LASIX) 20 MG tablet Take 1 tablet (20 mg total) by mouth 2 (two) times daily. NEEDS FOLLOW UP APPOINTMENT FOR MORE REFILLS   hydrOXYzine (ATARAX) 25 MG tablet Take 25 mg by mouth 2 (two) times daily as needed. for anxiety   ibuprofen (ADVIL) 800 MG tablet Take 1 tablet (800 mg total) by mouth 3 (three) times daily.   ipratropium-albuterol (DUONEB) 0.5-2.5 (3) MG/3ML SOLN Take 3 mLs by nebulization every 6 (six) hours as needed.   MOUNJARO 2.5 MG/0.5ML Pen SMARTSIG:2.5 Milligram(s) SUB-Q Once a Week   pantoprazole (PROTONIX) 40 MG tablet Take 1 tablet (40 mg total) by mouth daily at 6 (six) AM.   potassium chloride (KLOR-CON M) 10 MEQ tablet Take 1 tablet (  10 mEq total) by mouth daily. Absolute last refill without office visit please call [phone] to schedule   predniSONE (DELTASONE) 2.5 MG tablet Take 2.5mg  daily x's 2 weeks then 2.5mg  every other day x's 2 weeks   promethazine-dextromethorphan (PROMETHAZINE-DM) 6.25-15 MG/5ML syrup Take 10 mLs by mouth 2 (two) times daily as needed for cough.   SYMBICORT 160-4.5 MCG/ACT inhaler SMARTSIG:2 Puff(s) By Mouth Twice Daily   zolpidem (AMBIEN) 10 MG tablet Take 10 mg  by mouth at bedtime as needed for sleep.   No facility-administered encounter medications on file as of 04/17/2023.    Physical Exam: Blood pressure 116/64, pulse 80, height 5\' 3"  (1.6 m), weight 193 lb (87.5 kg), SpO2 98 %. Gen:      No acute distress HEENT:  EOMI, sclera anicteric Neck:     No masses; no thyromegaly Lungs:    Clear to auscultation bilaterally; normal respiratory effort CV:         Regular rate and rhythm; no murmurs Abd:      + bowel sounds; soft, non-tender; no palpable masses, no distension Ext:    No edema; adequate peripheral perfusion Skin:      Warm and dry; no rash Neuro: alert and oriented x 3 Psych: normal mood and affect   Data Reviewed: Imaging: CTA 05/26/2020-no PE, diffuse bilateral interstitial infiltrates and groundglass opacities.  Overall improved compared to prior CT. Chest x-ray 07/13/2020 -persistent bilateral pulmonary infiltrates.  I have reviewed the images personally. High-res CT 10/01/2020-improvement in groundglass opacities with development of fibrotic changes consistent with evolving post-COVID ILD.  CTA 03/01/2021-no pulmonary embolism, stable diffuse interstitial opacities. High-res CT 11/24/2021-stable pattern of groundglass and fibrotic changes. High-resolution CT 09/20/2022-stable pattern of groundglass, fibrotic changes consistent with post-COVID ILD I have reviewed the images personally.  PFTs: 08/31/2020 FVC 1.35 [50%], FEV1 1.28 [69%], F/F 95, TLC 1.99 [40%], DLCO 8.42 [44%] Severe restriction, diffusion defect  Labs: CBC 07/13/2020-WBC 10.8, eos 0.1%, absolute eosinophil count 11 CMP 07/13/2020-normal except for slight increase in bicarb and glucose  CTD serologies 02/08/2021- 1:320, cytoplasmic  Cardiac: Echocardiogram 04/24/2020-LVEF 60 to 65%, moderate concentric LVH, RV systolic size and function are normal.  RHC 05/20/2021 1. Mildly elevated right-sided filling pressures.  2. Mild pulmonary venous hypertension.  3. Normal  PCWP.   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  High-res CT reviewed with stable of fibrotic changes consistent with evolving post-COVID ILD.  CTD serologies show nonspecific elevation in ANA  Since we are unable to come off prednisone we are attempting azathioprine Continue azathioprine to 150 mg a day and slow steroid taper.  Check CBC for monitoring Reduce prednisone to 2.5 mg a day for 2 weeks and then take 2.5 mg every other day for 2 weeks.  If he is stable at this dose then she can stop the prednisone  Finished pulmonary rehab.  She is on supplemental oxygen at 1 L and had mild desats on exertion today so we will continue the same.  Asthma She has history of asthma but no obstruction on PFTs.  She has not tolerated Symbicort due to thrush Continue duo nebs, albuterol as needed  HFpEF Echo reviewed with EF 60-65%, LVH Right heart cath with no evidence of pulmonary arterial hypertension Continue Lasix Cardiology follow-up  OSA Intolerant of CPAP and is not interested in retrying Split-night sleep study ordered last year but patient canceled as she just had  her hair done and did not want to disturb it  Plan/Recommendations: Continue azathioprine to 150 mg Slow prednisone taper Check CBC Continue supplemental oxygen  Chilton Greathouse MD [facility] 04/17/2023, 11:21 AM  CC: Fleet Contras, MD

## 2023-04-17 NOTE — Patient Instructions (Signed)
Will get an x-ray today Check comprehensive metabolic panel and CBC Continue azathioprine Will get a high-res CT in 6 months and follow-up in clinic in 6 months

## 2023-04-20 ENCOUNTER — Telehealth: Payer: Self-pay | Admitting: Pulmonary Disease

## 2023-04-20 DIAGNOSIS — J849 Interstitial pulmonary disease, unspecified: Secondary | ICD-10-CM

## 2023-04-20 DIAGNOSIS — Z8616 Personal history of COVID-19: Secondary | ICD-10-CM

## 2023-04-20 NOTE — Telephone Encounter (Signed)
Pt called in to get back into therapy and go over Xray results

## 2023-04-23 ENCOUNTER — Telehealth (HOSPITAL_COMMUNITY): Payer: Self-pay

## 2023-04-23 NOTE — Telephone Encounter (Signed)
PT calling again to see the results of her last rads and if she can go back to therapy . Please call @ [phone]

## 2023-04-23 NOTE — Telephone Encounter (Signed)
Pt insurance is active and benefits verified through Medicare a/b Co-pay 0, DED $240/$240 met, out of pocket $8,850/$509.86 met, co-insurance 0%. no pre-authorization required, William/[employer] 04/23/2023@4 :07, REF# [id number]   NO VISIT LIMIT

## 2023-04-23 NOTE — Telephone Encounter (Signed)
Called and reviewed chest x-ray with patient.  Radiology read is pending but by my review there is no obvious reason for chest pain.  She states that the chest pain is improved  She is asking for a referral to pulmonary rehab and the order has been placed.  Nothing further needed.

## 2023-04-24 ENCOUNTER — Telehealth (HOSPITAL_COMMUNITY): Payer: Self-pay

## 2023-04-30 ENCOUNTER — Telehealth (HOSPITAL_COMMUNITY): Payer: Self-pay

## 2023-04-30 ENCOUNTER — Ambulatory Visit (HOSPITAL_COMMUNITY): Payer: 59

## 2023-04-30 NOTE — Telephone Encounter (Signed)
Pt called to cancel her pulmonary rehab orientation. Pt has other obligations to attend to at the moment and will not be able to do pulmonary rehab at this time.   Closed referral.

## 2023-05-08 ENCOUNTER — Ambulatory Visit (HOSPITAL_COMMUNITY): Payer: 59

## 2023-05-10 ENCOUNTER — Ambulatory Visit (HOSPITAL_COMMUNITY): Payer: 59

## 2023-05-15 ENCOUNTER — Encounter (HOSPITAL_COMMUNITY): Payer: Self-pay | Admitting: Gastroenterology

## 2023-05-15 ENCOUNTER — Ambulatory Visit (HOSPITAL_COMMUNITY): Payer: 59

## 2023-05-15 NOTE — Progress Notes (Signed)
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 Sees pulmonology, last visit 9/3, stable right now, still using 2L02 at all times, pt said breathing feels okay right now. Pulm recommend high-res CT 6 months and f/u at that time. During the pulm  note they did notate on problem list HFpEF but echo and cath were good, continue lasix and cardiology f/u. I asked patient about seeing cardiology and she said she hasn't that they haven't said she needs to come back from when they saw her in 2022 with MD Shirlee Latch. Patient endorses no cardiac symptoms, can get around okay with her 02, doesn't need any other equipment like cane/walker.  Anesthesia Review: Yes

## 2023-05-17 ENCOUNTER — Telehealth: Payer: Self-pay | Admitting: Pulmonary Disease

## 2023-05-17 ENCOUNTER — Ambulatory Visit (HOSPITAL_COMMUNITY): Payer: 59

## 2023-05-17 NOTE — Telephone Encounter (Signed)
Fax received from Dr. Lance Sell with [facility] to perform a colonoscopy on patient.  Patient needs surgery clearance. Surgery is pending 05/22/23. Patient was seen on 04/17/23. Office protocol is a risk assessment can be sent to surgeon if patient has been seen in 60 days or less.   Sending to Dr. Isaiah Serge for risk assessment or recommendations if patient needs to be seen in office prior to surgical procedure.

## 2023-05-18 NOTE — Telephone Encounter (Signed)
Secure chat to [facility] to ask about the status of this.

## 2023-05-18 NOTE — Telephone Encounter (Signed)
Melissa checking on surgical clearance. Surgery is scheduled 05/22/2023. Melissa phone number is [phone].

## 2023-05-21 ENCOUNTER — Encounter: Payer: Self-pay | Admitting: Pulmonary Disease

## 2023-05-21 NOTE — Progress Notes (Signed)
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  She is on supplemental oxygen  She is at increased risk for perioperative pulmonary complications but no contraindication to planned colonoscopy Per ARISCAT risk index she has Intermediate risk with projected 13.3% pulmonary complication rate  Peri-operative Assessment of Pulmonary Risk for Non-Thoracic Surgery:  ForMs. Koke, risk of perioperative pulmonary complications is increased by:  Age greater than 65 years  Obstructive sleep apnea    Respiratory complications generally occur in 1% of ASA Class I patients, 5% of ASA Class II and 10% of ASA Class III-IV patients These complications rarely result in mortality and iclude postoperative pneumonia, atelectasis, pulmonary embolism, ARDS and increased time requiring postoperative mechanical ventilation.  Overall, I recommend proceeding with the surgery if the risk for respiratory complications are outweighed by the potential benefits. This will need to be discussed between the patient and surgeon.  To reduce risks of respiratory complications, I recommend: --Pre- and post-operative incentive spirometry performed frequently while awake - Use of currently prescribed positive-pressure for OSA whenever the patient is sleeping --Avoiding use of pancuronium during anesthesia.  Chilton Greathouse MD [facility] See Amion for pager  If no response to pager , please call [phone] until 7pm After 7:00 pm call Elink  [phone] 05/21/2023, 4:44 AM

## 2023-05-21 NOTE — Telephone Encounter (Signed)
I put in a separate note with the pre operative pulmonary assessment

## 2023-05-21 NOTE — Telephone Encounter (Signed)
Note faxed to Dr Bosie Clos

## 2023-05-22 ENCOUNTER — Encounter (HOSPITAL_COMMUNITY): Payer: Self-pay | Admitting: Gastroenterology

## 2023-05-22 ENCOUNTER — Ambulatory Visit (HOSPITAL_BASED_OUTPATIENT_CLINIC_OR_DEPARTMENT_OTHER): Payer: 59 | Admitting: Anesthesiology

## 2023-05-22 ENCOUNTER — Other Ambulatory Visit: Payer: Self-pay

## 2023-05-22 ENCOUNTER — Ambulatory Visit (HOSPITAL_COMMUNITY)
Admission: RE | Admit: 2023-05-22 | Discharge: 2023-05-22 | Disposition: A | Discharge status: Home / Self Care | Payer: 59 | Attending: Gastroenterology | Admitting: Gastroenterology

## 2023-05-22 ENCOUNTER — Ambulatory Visit (HOSPITAL_COMMUNITY): Payer: 59

## 2023-05-22 ENCOUNTER — Encounter (HOSPITAL_COMMUNITY)
Admission: RE | Disposition: A | Discharge status: Home / Self Care | Payer: Self-pay | Source: Home / Self Care | Attending: Gastroenterology

## 2023-05-22 ENCOUNTER — Ambulatory Visit (HOSPITAL_COMMUNITY): Payer: 59 | Admitting: Anesthesiology

## 2023-05-22 DIAGNOSIS — K5730 Diverticulosis of large intestine without perforation or abscess without bleeding: Secondary | ICD-10-CM | POA: Insufficient documentation

## 2023-05-22 DIAGNOSIS — Z860101 Personal history of adenomatous and serrated colon polyps: Secondary | ICD-10-CM | POA: Insufficient documentation

## 2023-05-22 DIAGNOSIS — D125 Benign neoplasm of sigmoid colon: Secondary | ICD-10-CM | POA: Diagnosis not present

## 2023-05-22 DIAGNOSIS — D126 Benign neoplasm of colon, unspecified: Secondary | ICD-10-CM | POA: Diagnosis not present

## 2023-05-22 DIAGNOSIS — Z1211 Encounter for screening for malignant neoplasm of colon: Secondary | ICD-10-CM | POA: Diagnosis present

## 2023-05-22 DIAGNOSIS — Z8719 Personal history of other diseases of the digestive system: Secondary | ICD-10-CM | POA: Diagnosis not present

## 2023-05-22 DIAGNOSIS — K640 First degree hemorrhoids: Secondary | ICD-10-CM | POA: Diagnosis not present

## 2023-05-22 DIAGNOSIS — F419 Anxiety disorder, unspecified: Secondary | ICD-10-CM | POA: Diagnosis not present

## 2023-05-22 DIAGNOSIS — D124 Benign neoplasm of descending colon: Secondary | ICD-10-CM | POA: Insufficient documentation

## 2023-05-22 DIAGNOSIS — Z860100 Personal history of colon polyps, unspecified: Secondary | ICD-10-CM

## 2023-05-22 DIAGNOSIS — Z800 Family history of malignant neoplasm of digestive organs: Secondary | ICD-10-CM | POA: Insufficient documentation

## 2023-05-22 DIAGNOSIS — Z8616 Personal history of COVID-19: Secondary | ICD-10-CM | POA: Diagnosis not present

## 2023-05-22 DIAGNOSIS — F17210 Nicotine dependence, cigarettes, uncomplicated: Secondary | ICD-10-CM | POA: Insufficient documentation

## 2023-05-22 DIAGNOSIS — I509 Heart failure, unspecified: Secondary | ICD-10-CM | POA: Insufficient documentation

## 2023-05-22 DIAGNOSIS — I110 Hypertensive heart disease with heart failure: Secondary | ICD-10-CM | POA: Insufficient documentation

## 2023-05-22 HISTORY — PX: COLONOSCOPY WITH PROPOFOL: SHX5780

## 2023-05-22 HISTORY — PX: POLYPECTOMY: SHX5525

## 2023-05-22 LAB — GLUCOSE, CAPILLARY
Glucose-Capillary: 69 mg/dL — ABNORMAL LOW (ref 70–99)
Glucose-Capillary: 122 mg/dL — ABNORMAL HIGH (ref 70–99)

## 2023-05-22 SURGERY — COLONOSCOPY WITH PROPOFOL
Anesthesia: Monitor Anesthesia Care

## 2023-05-22 MED ORDER — PROPOFOL 500 MG/50ML IV EMUL
INTRAVENOUS | Status: AC
  Filled 2023-05-22: qty 50

## 2023-05-22 MED ORDER — LACTATED RINGERS IV SOLN: INTRAVENOUS | Status: AC

## 2023-05-22 MED ORDER — DEXTROSE 50 % IV SOLN
12.5000 g | Freq: Once | INTRAVENOUS | Status: AC
  Administered 2023-05-22: 12.5 g via INTRAVENOUS

## 2023-05-22 MED ORDER — DEXTROSE 50 % IV SOLN
INTRAVENOUS | Status: AC
  Filled 2023-05-22: qty 50

## 2023-05-22 MED ORDER — LIDOCAINE 2% (20 MG/ML) 5 ML SYRINGE
INTRAMUSCULAR | Status: DC | PRN
  Administered 2023-05-22: 50 mg via INTRAVENOUS

## 2023-05-22 MED ORDER — PROPOFOL 10 MG/ML IV BOLUS
INTRAVENOUS | Status: DC | PRN
  Administered 2023-05-22: 20 mg via INTRAVENOUS
  Administered 2023-05-22: 30 mg via INTRAVENOUS

## 2023-05-22 MED ORDER — PROPOFOL 500 MG/50ML IV EMUL
INTRAVENOUS | Status: DC | PRN
  Administered 2023-05-22: 125 ug/kg/min via INTRAVENOUS

## 2023-05-22 MED ORDER — SODIUM CHLORIDE 0.9 % IV SOLN: INTRAVENOUS | Status: DC

## 2023-05-22 MED ORDER — IPRATROPIUM-ALBUTEROL 0.5-2.5 (3) MG/3ML IN SOLN
3.0000 mL | Freq: Once | RESPIRATORY_TRACT | Status: AC
  Administered 2023-05-22: 3 mL via RESPIRATORY_TRACT

## 2023-05-22 MED ORDER — IPRATROPIUM-ALBUTEROL 0.5-2.5 (3) MG/3ML IN SOLN
RESPIRATORY_TRACT | Status: AC
  Filled 2023-05-22: qty 3

## 2023-05-22 SURGICAL SUPPLY — 22 items

## 2023-05-22 NOTE — Anesthesia Preprocedure Evaluation (Signed)
Anesthesia Evaluation  Patient identified by MRN, date of birth, ID band Patient awake    Reviewed: Allergy & Precautions, H&P , NPO status , Patient's Chart, lab work & pertinent test results  Airway Mallampati: II  TM Distance: >3 FB Neck ROM: Full    Dental no notable dental hx. (+) Partial Lower, Partial Upper, Dental Advisory Given   Pulmonary asthma , sleep apnea , Patient abstained from smoking., former smoker   Pulmonary exam normal breath sounds clear to auscultation       Cardiovascular hypertension, +CHF   Rhythm:Regular Rate:Normal     Neuro/Psych   Anxiety     negative neurological ROS     GI/Hepatic Neg liver ROS,GERD  Medicated,,  Endo/Other  negative endocrine ROS    Renal/GU negative Renal ROS  negative genitourinary   Musculoskeletal  (+) Arthritis , Osteoarthritis,    Abdominal   Peds  Hematology  (+) Blood dyscrasia, anemia   Anesthesia Other Findings   Reproductive/Obstetrics negative OB ROS                             Anesthesia Physical Anesthesia Plan  ASA: 3  Anesthesia Plan: MAC   Post-op Pain Management: Minimal or no pain anticipated   Induction: Intravenous  PONV Risk Score and Plan: 2 and Propofol infusion and Treatment may vary due to age or medical condition  Airway Management Planned: Natural Airway and Simple Face Mask  Additional Equipment:   Intra-op Plan:   Post-operative Plan:   Informed Consent: I have reviewed the patients History and Physical, chart, labs and discussed the procedure including the risks, benefits and alternatives for the proposed anesthesia with the patient or authorized representative who has indicated his/her understanding and acceptance.     Dental advisory given  Plan Discussed with: CRNA  Anesthesia Plan Comments:        Anesthesia Quick Evaluation

## 2023-05-22 NOTE — Discharge Instructions (Signed)

## 2023-05-22 NOTE — Anesthesia Procedure Notes (Signed)
Procedure Name: MAC Date/Time: 05/22/2023 9:10 AM  Performed by: Vanessa [place], CRNAPre-anesthesia Checklist: Patient identified, Emergency Drugs available, Suction available and Patient being monitored Patient Re-evaluated:Patient Re-evaluated prior to induction Oxygen Delivery Method: Simple face mask

## 2023-05-22 NOTE — Progress Notes (Signed)
Hypoglycemic Event  CBG: 69  Treatment: D50 25 mL (12.5 gm)  Symptoms: Vision changes  Follow-up CBG: Time:845 CBG Result:122  Possible Reasons for Event: Inadequate meal intake  Comments/MD notified: Anesthesia MD notified     Yasmeen Manka A

## 2023-05-22 NOTE — Progress Notes (Signed)
Daughter has her lunch break at 11am and will pick up then. Patient dressed and tolerating a snack well

## 2023-05-22 NOTE — H&P (Signed)
Date of Initial H&P: 05/10/23  History reviewed, patient examined, no change in status, stable for surgery.

## 2023-05-22 NOTE — Op Note (Signed)
[facility] Patient Name: Tiffany Coleman Procedure Date: 05/22/2023 MRN: [id number] Attending MD: Shirley Friar , MD, [id number] Date of Birth: [birth year] CSN: [id number] Age: 66 Admit Type: Outpatient Procedure:                Colonoscopy Indications:              High risk colon cancer surveillance: Personal                            history of colonic polyps, Last colonoscopy:                            October 2018 Providers:                Shirley Friar, MD, Suzy Bouchard, RN, Fransisca Connors, Beryle Beams, Technician, Geoffery Lyons, Technician Referring MD:             Verlon Setting. Avbuere MD Medicines:                Propofol per Anesthesia, Monitored Anesthesia Care Complications:            No immediate complications. Estimated Blood Loss:     Estimated blood loss was minimal. Procedure:                Pre-Anesthesia Assessment:                           - Prior to the procedure, a History and Physical                            was performed, and patient medications and                            allergies were reviewed. The patient's tolerance of                            previous anesthesia was also reviewed. The risks                            and benefits of the procedure and the sedation                            options and risks were discussed with the patient.                            All questions were answered, and informed consent                            was obtained. Prior Anticoagulants: The patient has  taken no anticoagulant or antiplatelet agents. ASA                            Grade Assessment: III - A patient with severe                            systemic disease. After reviewing the risks and                            benefits, the patient was deemed in satisfactory                            condition to undergo the procedure.                            After obtaining informed consent, the colonoscope                            was passed under direct vision. Throughout the                            procedure, the patient's blood pressure, pulse, and                            oxygen saturations were monitored continuously. The                            PCF-HQ190L ([id number]) Olympus colonoscope was                            introduced through the anus and advanced to the the                            cecum, identified by appendiceal orifice and                            ileocecal valve. The colonoscopy was performed                            without difficulty. The patient tolerated the                            procedure well. The quality of the bowel                            preparation was fair and fair but repeated                            irrigation led to a good and adequate prep. Scope In: 9:19:17 AM Scope Out: 9:43:46 AM Scope Withdrawal Time: 0 hours 20 minutes 30 seconds  Total Procedure Duration: 0 hours 24 minutes 29 seconds  Findings:      The perianal and digital rectal examinations were normal.      A 14 mm polyp was found in  the descending colon. The polyp was       pedunculated. The polyp was removed with a hot snare. Resection and       retrieval were complete. Estimated blood loss: none.      Two semi-sessile polyps were found in the sigmoid colon. The polyps were       3 to 4 mm in size. These polyps were removed with a hot snare. Resection       and retrieval were complete. Estimated blood loss: none.      A 3 mm polyp was found in the rectum. The polyp was semi-sessile. The       polyp was removed with a cold biopsy forceps. Resection and retrieval       were complete. Estimated blood loss was minimal.      Multiple large-mouthed, medium-mouthed and small-mouthed diverticula       were found in the entire colon.      Internal hemorrhoids were found during retroflexion. The hemorrhoids        were medium-sized and Grade I (internal hemorrhoids that do not       prolapse). Impression:               - Preparation of the colon was fair.                           - One 14 mm polyp in the descending colon, removed                            with a hot snare. Resected and retrieved.                           - Two 3 to 4 mm polyps in the sigmoid colon,                            removed with a hot snare. Resected and retrieved.                           - One 3 mm polyp in the rectum, removed with a cold                            biopsy forceps. Resected and retrieved.                           - Diverticulosis in the entire examined colon.                           - Internal hemorrhoids. Moderate Sedation:      N/A - MAC procedure Recommendation:           - Patient has a contact number available for                            emergencies. The signs and symptoms of potential                            delayed complications were discussed with the  patient. Return to normal activities tomorrow.                            Written discharge instructions were provided to the                            patient.                           - High fiber diet.                           - Await pathology results.                           - Repeat colonoscopy for surveillance based on                            pathology results.                           - No ibuprofen, naproxen, or other non-steroidal                            anti-inflammatory drugs for 1 week. Procedure Code(s):        --- Professional ---                           [phone], Colonoscopy, flexible; with removal of                            tumor(s), polyp(s), or other lesion(s) by snare                            technique                           45380, 59, Colonoscopy, flexible; with biopsy,                            single or multiple Diagnosis Code(s):        --- Professional ---                            Z86.010, Personal history of colonic polyps                           D12.4, Benign neoplasm of descending colon                           D12.8, Benign neoplasm of rectum                           D12.5, Benign neoplasm of sigmoid colon                           K64.0, First degree hemorrhoids  K57.30, Diverticulosis of large intestine without                            perforation or abscess without bleeding CPT copyright 2022 American Medical Association. All rights reserved. The codes documented in this report are preliminary and upon coder review may  be revised to meet current compliance requirements. Shirley Friar, MD 05/22/2023 9:52:02 AM This report has been signed electronically. Number of Addenda: 0

## 2023-05-22 NOTE — Interval H&P Note (Signed)
History and Physical Interval Note:  05/22/2023 9:10 AM  Tiffany Coleman  has presented today for surgery, with the diagnosis of History of colon polyps.  The various methods of treatment have been discussed with the patient and family. After consideration of risks, benefits and other options for treatment, the patient has consented to  Procedure(s): COLONOSCOPY WITH PROPOFOL (N/A) as a surgical intervention.  The patient's history has been reviewed, patient examined, no change in status, stable for surgery.  I have reviewed the patient's chart and labs.  Questions were answered to the patient's satisfaction.     Shirley Friar

## 2023-05-22 NOTE — Transfer of Care (Signed)
Immediate Anesthesia Transfer of Care Note  Patient: Tiffany Coleman  Procedure(s) Performed: COLONOSCOPY WITH PROPOFOL POLYPECTOMY  Patient Location: Endoscopy Unit  Anesthesia Type:MAC  Level of Consciousness: awake and patient cooperative  Airway & Oxygen Therapy: Patient Spontanous Breathing and Patient connected to face mask  Post-op Assessment: Report given to RN and Post -op Vital signs reviewed and stable  Post vital signs: Reviewed and stable  Last Vitals:  Vitals Value Taken Time  BP    Temp    Pulse 90 05/22/23 0950  Resp 23 05/22/23 0950  SpO2 100 % 05/22/23 0950  Vitals shown include unfiled device data.  Last Pain: There were no vitals filed for this visit.       Complications: No notable events documented.

## 2023-05-22 NOTE — Anesthesia Postprocedure Evaluation (Signed)
Anesthesia Post Note  Patient: Tiffany Coleman  Procedure(s) Performed: COLONOSCOPY WITH PROPOFOL POLYPECTOMY     Patient location during evaluation: Endoscopy Anesthesia Type: MAC Level of consciousness: awake and alert Pain management: pain level controlled Vital Signs Assessment: post-procedure vital signs reviewed and stable Respiratory status: spontaneous breathing, nonlabored ventilation and respiratory function stable Cardiovascular status: stable and blood pressure returned to baseline Postop Assessment: no apparent nausea or vomiting Anesthetic complications: no  No notable events documented.  Last Vitals:  Vitals:   05/22/23 0951  BP: 102/70  Pulse: 90  Resp: (!) 23  Temp: 36.5 C  SpO2: 100%    Last Pain:  Vitals:   05/22/23 0951  TempSrc: Temporal                 Panagiota Perfetti,W. EDMOND

## 2023-05-24 ENCOUNTER — Ambulatory Visit (HOSPITAL_COMMUNITY): Payer: 59

## 2023-05-26 ENCOUNTER — Encounter (HOSPITAL_COMMUNITY): Payer: Self-pay | Admitting: Gastroenterology

## 2023-05-29 ENCOUNTER — Ambulatory Visit (HOSPITAL_COMMUNITY): Payer: 59

## 2023-05-30 LAB — SURGICAL PATHOLOGY

## 2023-05-31 ENCOUNTER — Ambulatory Visit (HOSPITAL_COMMUNITY): Payer: 59

## 2023-06-02 ENCOUNTER — Emergency Department (HOSPITAL_BASED_OUTPATIENT_CLINIC_OR_DEPARTMENT_OTHER): Payer: 59

## 2023-06-02 ENCOUNTER — Emergency Department (HOSPITAL_BASED_OUTPATIENT_CLINIC_OR_DEPARTMENT_OTHER)
Admission: EM | Admit: 2023-06-02 | Discharge: 2023-06-03 | Disposition: A | Discharge status: Home / Self Care | Payer: 59 | Attending: Emergency Medicine | Admitting: Emergency Medicine

## 2023-06-02 ENCOUNTER — Other Ambulatory Visit: Payer: Self-pay

## 2023-06-02 ENCOUNTER — Encounter (HOSPITAL_BASED_OUTPATIENT_CLINIC_OR_DEPARTMENT_OTHER): Payer: Self-pay

## 2023-06-02 DIAGNOSIS — Z91040 Latex allergy status: Secondary | ICD-10-CM | POA: Insufficient documentation

## 2023-06-02 DIAGNOSIS — R112 Nausea with vomiting, unspecified: Secondary | ICD-10-CM

## 2023-06-02 DIAGNOSIS — I509 Heart failure, unspecified: Secondary | ICD-10-CM | POA: Diagnosis not present

## 2023-06-02 DIAGNOSIS — R1909 Other intra-abdominal and pelvic swelling, mass and lump: Secondary | ICD-10-CM | POA: Diagnosis not present

## 2023-06-02 DIAGNOSIS — Z1152 Encounter for screening for COVID-19: Secondary | ICD-10-CM | POA: Diagnosis not present

## 2023-06-02 DIAGNOSIS — J849 Interstitial pulmonary disease, unspecified: Secondary | ICD-10-CM | POA: Diagnosis not present

## 2023-06-02 LAB — URINALYSIS, ROUTINE W REFLEX MICROSCOPIC
Bilirubin Urine: NEGATIVE
Glucose, UA: NEGATIVE mg/dL
Ketones, ur: NEGATIVE mg/dL
Leukocytes,Ua: NEGATIVE
Nitrite: NEGATIVE
Protein, ur: NEGATIVE mg/dL
Specific Gravity, Urine: 1.008 (ref 1.005–1.030)
pH: 6 (ref 5.0–8.0)

## 2023-06-02 LAB — COMPREHENSIVE METABOLIC PANEL
ALT: 6 U/L (ref 0–44)
AST: 11 U/L — ABNORMAL LOW (ref 15–41)
Albumin: 4.2 g/dL (ref 3.5–5.0)
Alkaline Phosphatase: 49 U/L (ref 38–126)
Anion gap: 6 (ref 5–15)
BUN: 9 mg/dL (ref 8–23)
CO2: 30 mmol/L (ref 22–32)
Calcium: 10.6 mg/dL — ABNORMAL HIGH (ref 8.9–10.3)
Chloride: 100 mmol/L (ref 98–111)
Creatinine, Ser: 0.88 mg/dL (ref 0.44–1.00)
GFR, Estimated: >60 mL/min (ref >60)
Glucose, Bld: 87 mg/dL (ref 70–99)
Potassium: 3.8 mmol/L (ref 3.5–5.1)
Sodium: 136 mmol/L (ref 135–145)
Total Bilirubin: 0.6 mg/dL (ref 0.3–1.2)
Total Protein: 6.8 g/dL (ref 6.5–8.1)

## 2023-06-02 LAB — CBC
HCT: 34.7 % — ABNORMAL LOW (ref 36.0–46.0)
Hemoglobin: 11.3 g/dL — ABNORMAL LOW (ref 12.0–15.0)
MCH: 29.6 pg (ref 26.0–34.0)
MCHC: 32.6 g/dL (ref 30.0–36.0)
MCV: 90.8 fL (ref 80.0–100.0)
Platelets: 277 10*3/uL (ref 150–400)
RBC: 3.82 MIL/uL — ABNORMAL LOW (ref 3.87–5.11)
RDW: 13.8 % (ref 11.5–15.5)
WBC: 4.9 10*3/uL (ref 4.0–10.5)
nRBC: 0 % (ref 0.0–0.2)

## 2023-06-02 LAB — CBG MONITORING, ED: Glucose-Capillary: 78 mg/dL (ref 70–99)

## 2023-06-02 LAB — LIPASE, BLOOD: Lipase: 26 U/L (ref 11–51)

## 2023-06-02 LAB — SARS CORONAVIRUS 2 BY RT PCR: SARS Coronavirus 2 by RT PCR: NEGATIVE

## 2023-06-02 MED ORDER — ONDANSETRON 4 MG PO TBDP
4.0000 mg | ORAL_TABLET | Freq: Once | ORAL | Status: AC | PRN
  Administered 2023-06-02: 4 mg via ORAL
  Filled 2023-06-02: qty 1

## 2023-06-02 MED ORDER — LIDOCAINE-EPINEPHRINE (PF) 2 %-1:200000 IJ SOLN
10.0000 mL | Freq: Once | INTRAMUSCULAR | Status: AC
  Administered 2023-06-02: 10 mL
  Filled 2023-06-02: qty 20

## 2023-06-02 MED ORDER — IOHEXOL 300 MG/ML  SOLN
100.0000 mL | Freq: Once | INTRAMUSCULAR | Status: AC | PRN
  Administered 2023-06-02: 100 mL via INTRAVENOUS

## 2023-06-02 NOTE — ED Notes (Signed)
Pt was given peanut butter cheese crackers and ginger ale to eat and drink for PO challenge.

## 2023-06-02 NOTE — ED Triage Notes (Signed)
Pt c/o N/V, abd pain x 3 days. Pt reports associated dizziness and "lung pain."

## 2023-06-02 NOTE — ED Provider Notes (Signed)
[place] EMERGENCY DEPARTMENT AT [facility] Provider Note   CSN: [id number] Arrival date & time: 06/02/23  1507     History {Add pertinent medical, surgical, social history, OB history to HPI:1} Chief Complaint  Patient presents with   Emesis    Tiffany Coleman is a 67 y.o. female.   Emesis Patient presents with few different complaints.  Nausea vomiting.  States she has been coughing up sputum.  States she has been vomiting up to sputum.  Somewhat difficult to get from the history whether she has been vomiting or just coughing a lot.  Sounds as if there is a component of both.  States somewhat decreased oral intake due to it.  States she is also coughing up phlegm.  Some pain in the upper abdomen.  History of chronic lung disease since COVID a few years ago.  Is on baseline oxygen.  Also on immunosuppression.  No definite sick contacts.  No diarrhea.  Pain in the upper abdomen is dull.  States she has had a umbilical hernia.  States she has mesh in there. Also states she has had a lesion in her pubic hair that has now burst.  Unable to describe what is coming out but states that she is supposed to see dermatology.    Past Medical History:  Diagnosis Date   Anemia yrs ago   Arthritis    knees   Bronchitis    hx   CHF (congestive heart failure) (HCC)    Complication of anesthesia    trouble breathing when wake up needs breathing tx when wakes up   Hx of colonic polyps 2015   Obstructive sleep apnea 06/02/2016   No CPAP used   Recurrent ventral hernia 09/08/2013   Umbilical hernia     Home Medications Prior to Admission medications   Medication Sig Start Date End Date Taking? Authorizing Provider  acetaminophen (TYLENOL) 325 MG tablet Take 2 tablets (650 mg total) by mouth every 6 (six) hours as needed for mild pain or headache (fever >/= 101). 05/11/20   Arrien, York Ram, MD  albuterol (2.5 MG/3ML) 0.083% NEBU 3 mL, albuterol (5 MG/ML) 0.5% NEBU 0.5 mL  Inhale 5 mg into the lungs once a week.    [provider]  albuterol (VENTOLIN HFA) 108 (90 Base) MCG/ACT inhaler Inhale 2 puffs into the lungs every 6 (six) hours as needed for wheezing or shortness of breath. 09/06/20   Parrett, Virgel Bouquet, NP  azaTHIOprine (IMURAN) 50 MG tablet TAKE 3 TABLETS(150 MG) BY MOUTH DAILY 11/21/22   [name], Praveen, MD  diclofenac Sodium (VOLTAREN) 1 % GEL SMARTSIG:4 Gram(s) Topical 4 Times Daily PRN 03/07/22   [provider]  fluticasone (FLONASE) 50 MCG/ACT nasal spray Place 2 sprays into both nostrils daily. Patient taking differently: Place 2 sprays into both nostrils daily as needed for rhinitis. 06/01/20   Rodolph Bong, MD  furosemide (LASIX) 20 MG tablet Take 1 tablet (20 mg total) by mouth 2 (two) times daily. NEEDS FOLLOW UP APPOINTMENT FOR MORE REFILLS 11/28/21   Laurey Morale, MD  hydrOXYzine (ATARAX) 25 MG tablet Take 25 mg by mouth 2 (two) times daily as needed. for anxiety 05/23/22   [provider]  ibuprofen (ADVIL) 800 MG tablet Take 1 tablet (800 mg total) by mouth 3 (three) times daily. 01/14/22   Edwin Dada P, DO  ipratropium-albuterol (DUONEB) 0.5-2.5 (3) MG/3ML SOLN Take 3 mLs by nebulization every 6 (six) hours as needed. 12/14/20  Chilton Greathouse, MD  MOUNJARO 2.5 MG/0.5ML Pen SMARTSIG:2.5 Milligram(s) SUB-Q Once a Week 08/08/21   [provider]  pantoprazole (PROTONIX) 40 MG tablet Take 1 tablet (40 mg total) by mouth daily at 6 (six) AM. 06/01/20   Rodolph Bong, MD  potassium chloride (KLOR-CON M) 10 MEQ tablet Take 1 tablet (10 mEq total) by mouth daily. Absolute last refill without office visit please call [phone] to schedule 12/27/21   Laurey Morale, MD  predniSONE (DELTASONE) 2.5 MG tablet Take 2.5mg  daily x's 2 weeks then 2.5mg  every other day x's 2 weeks 10/04/22   Chilton Greathouse, MD  promethazine-dextromethorphan (PROMETHAZINE-DM) 6.25-15 MG/5ML syrup Take 10 mLs by mouth 2 (two) times  daily as needed for cough. 05/04/21   [provider]  SYMBICORT 160-4.5 MCG/ACT inhaler SMARTSIG:2 Puff(s) By Mouth Twice Daily 06/09/22   [provider]  zolpidem (AMBIEN) 10 MG tablet Take 10 mg by mouth at bedtime as needed for sleep. 05/15/21   [provider]      Allergies    Latex, Other, and Tape    Review of Systems   Review of Systems  Gastrointestinal:  Positive for vomiting.    Physical Exam Updated Vital Signs BP (!) 138/97   Pulse 70   Temp 98.5 F (36.9 C) (Oral)   Resp 20   Ht 5\' 3"  (1.6 m)   Wt 82.6 kg   SpO2 100%   BMI 32.24 kg/m  Physical Exam Vitals and nursing note reviewed.  Eyes:     Pupils: Pupils are equal, round, and reactive to light.  Cardiovascular:     Rate and Rhythm: Regular rhythm.  Pulmonary:     Comments: Diffuse harsh breath sounds. Abdominal:     Tenderness: There is abdominal tenderness.     Comments: Mild epigastric tenderness without rebound or guarding.  Does have some tenderness at the superior aspect of the scar from previous ventral hernia repair.  Genitourinary:    Comments: *** Musculoskeletal:     Cervical back: Neck supple. No tenderness.  Skin:    General: Skin is warm.  Neurological:     Mental Status: She is alert.     ED Results / Procedures / Treatments   Labs (all labs ordered are listed, but only abnormal results are displayed) Labs Reviewed  LIPASE, BLOOD  COMPREHENSIVE METABOLIC PANEL  CBC  URINALYSIS, ROUTINE W REFLEX MICROSCOPIC  CBG MONITORING, ED    EKG None  Radiology No results found.  Procedures Procedures  {Document cardiac monitor, telemetry assessment procedure when appropriate:1}  Medications Ordered in ED Medications  ondansetron (ZOFRAN-ODT) disintegrating tablet 4 mg (4 mg Oral Given 06/02/23 1613)    ED Course/ Medical Decision Making/ A&P   {   Click here for ABCD2, HEART and other calculatorsREFRESH Note before signing :1}                               Medical Decision Making Amount and/or Complexity of Data Reviewed Labs: ordered. Radiology: ordered.  Risk Prescription drug management.   Patient with various complaints.  Has been coughing or vomiting up sputum.  Differential diagnosis does include obstruction and causes such as pneumonia.  Will get x-ray.  Will get basic blood work. Also possibility of bowel obstruction.  Reportedly has hernia.  I do not feel obstructed hernia at this time.  Potentially will require CT evaluation.  Also reportedly has lesion in her  pubic hair.  Will get chaperone and evaluate.   {Document critical care time when appropriate:1} {Document review of labs and clinical decision tools ie heart score, Chads2Vasc2 etc:1}  {Document your independent review of radiology images, and any outside records:1} {Document your discussion with family members, caretakers, and with consultants:1} {Document social determinants of health affecting pt's care:1} {Document your decision making why or why not admission, treatments were needed:1} Final Clinical Impression(s) / ED Diagnoses Final diagnoses:  None    Rx / DC Orders ED Discharge Orders     None

## 2023-06-03 MED ORDER — ONDANSETRON 4 MG PO TBDP: 4.0000 mg | ORAL_TABLET | Freq: Three times a day (TID) | ORAL | 0 refills | Status: DC | PRN

## 2023-06-03 NOTE — Discharge Instructions (Addendum)
Follow-up with dermatology as planned for the cyst.

## 2023-06-05 ENCOUNTER — Ambulatory Visit (HOSPITAL_COMMUNITY): Payer: 59

## 2023-06-07 ENCOUNTER — Ambulatory Visit (HOSPITAL_COMMUNITY): Payer: 59

## 2023-06-12 ENCOUNTER — Ambulatory Visit (HOSPITAL_COMMUNITY): Payer: 59

## 2023-06-14 ENCOUNTER — Ambulatory Visit (HOSPITAL_COMMUNITY): Payer: 59

## 2023-06-19 ENCOUNTER — Ambulatory Visit (HOSPITAL_COMMUNITY): Payer: 59

## 2023-06-21 ENCOUNTER — Ambulatory Visit (HOSPITAL_COMMUNITY): Payer: 59

## 2023-06-26 ENCOUNTER — Ambulatory Visit (HOSPITAL_COMMUNITY): Payer: 59

## 2023-06-28 ENCOUNTER — Ambulatory Visit (HOSPITAL_COMMUNITY): Payer: 59

## 2023-07-03 ENCOUNTER — Ambulatory Visit (HOSPITAL_COMMUNITY): Payer: 59

## 2023-07-05 ENCOUNTER — Ambulatory Visit (HOSPITAL_COMMUNITY): Payer: 59

## 2023-07-10 ENCOUNTER — Ambulatory Visit (HOSPITAL_COMMUNITY): Payer: 59

## 2023-07-17 ENCOUNTER — Ambulatory Visit (HOSPITAL_COMMUNITY): Payer: 59

## 2023-07-19 ENCOUNTER — Ambulatory Visit (HOSPITAL_COMMUNITY): Payer: 59

## 2023-07-23 ENCOUNTER — Ambulatory Visit: Payer: 59 | Admitting: Dermatology

## 2023-07-24 ENCOUNTER — Ambulatory Visit (HOSPITAL_COMMUNITY): Payer: 59

## 2023-07-25 ENCOUNTER — Encounter: Payer: Self-pay | Admitting: Dermatology

## 2023-07-25 ENCOUNTER — Ambulatory Visit: Payer: 59 | Admitting: Dermatology

## 2023-07-25 DIAGNOSIS — L720 Epidermal cyst: Secondary | ICD-10-CM

## 2023-07-25 DIAGNOSIS — L738 Other specified follicular disorders: Secondary | ICD-10-CM

## 2023-07-25 DIAGNOSIS — L818 Other specified disorders of pigmentation: Secondary | ICD-10-CM | POA: Diagnosis not present

## 2023-07-25 DIAGNOSIS — D239 Other benign neoplasm of skin, unspecified: Secondary | ICD-10-CM

## 2023-07-25 NOTE — Patient Instructions (Addendum)
Comedone extractor  Due to recent changes in healthcare laws, you may see results of your pathology and/or laboratory studies on MyChart before the doctors have had a chance to review them. We understand that in some cases there may be results that are confusing or concerning to you. Please understand that not all results are received at the same time and often the doctors may need to interpret multiple results in order to provide you with the best plan of care or course of treatment. Therefore, we ask that you please give [place] 2 business days to thoroughly review all your results before contacting the office for clarification. Should we see a critical lab result, you will be contacted sooner.   If You Need Anything After Your Visit  If you have any questions or concerns for your doctor, please call our main line at [phone] and press option 4 to reach your doctor's medical assistant. If no one answers, please leave a voicemail as directed and we will return your call as soon as possible. Messages left after 4 pm will be answered the following business day.   You may also send [place] a message via MyChart. We typically respond to MyChart messages within 1-2 business days.  For prescription refills, please ask your pharmacy to contact our office. Our fax number is [phone].  If you have an urgent issue when the clinic is closed that cannot wait until the next business day, you can page your doctor at the number below.    Please note that while we do our best to be available for urgent issues outside of office hours, we are not available 24/7.   If you have an urgent issue and are unable to reach [place], you may choose to seek medical care at your doctor's office, retail clinic, urgent care center, or emergency room.  If you have a medical emergency, please immediately call 911 or go to the emergency department.  Pager Numbers  - Dr. Gwen Pounds: [phone]  - Dr. Roseanne Reno: [phone]  - Dr.  Katrinka Blazing: [phone]   In the event of inclement weather, please call our main line at [phone] for an update on the status of any delays or closures.  Dermatology Medication Tips: Please keep the boxes that topical medications come in in order to help keep track of the instructions about where and how to use these. Pharmacies typically print the medication instructions only on the boxes and not directly on the medication tubes.   If your medication is too expensive, please contact our office at [phone] option 4 or send [place] a message through MyChart.   We are unable to tell what your co-pay for medications will be in advance as this is different depending on your insurance coverage. However, we may be able to find a substitute medication at lower cost or fill out paperwork to get insurance to cover a needed medication.   If a prior authorization is required to get your medication covered by your insurance company, please allow [place] 1-2 business days to complete this process.  Drug prices often vary depending on where the prescription is filled and some pharmacies may offer cheaper prices.  The website www.goodrx.com contains coupons for medications through different pharmacies. The prices here do not account for what the cost may be with help from insurance (it may be cheaper with your insurance), but the website can give you the price if you did not use any insurance.  - You can print the associated coupon  and take it with your prescription to the pharmacy.  - You may also stop by our office during regular business hours and pick up a GoodRx coupon card.  - If you need your prescription sent electronically to a different pharmacy, notify our office through [facility] or by phone at [phone] option 4.     Si Usted Necesita Algo Despus de Su Visita  Tambin puede enviarnos un mensaje a travs de Clinical cytogeneticist. Por lo general respondemos a los mensajes de MyChart en el transcurso  de 1 a 2 das hbiles.  Para renovar recetas, por favor pida a su farmacia que se ponga en contacto con nuestra oficina. Annie Sable de fax es [place] [phone].  Si tiene un asunto urgente cuando la clnica est cerrada y que no puede esperar hasta el siguiente da hbil, puede llamar/localizar a su doctor(a) al nmero que aparece a continuacin.   Por favor, tenga en cuenta que aunque hacemos todo lo posible para estar disponibles para asuntos urgentes fuera del horario de [place], no estamos disponibles las 24 horas del da, los 7 [address]  [address] de la [place].   Si tiene un problema urgente y no puede comunicarse con nosotros, puede optar por buscar atencin mdica  en el consultorio de su doctor(a), en una clnica privada, en un centro de atencin urgente o en una sala de emergencias.  Si tiene Engineer, drilling, por favor llame inmediatamente al 911 o vaya a la sala de emergencias.  Nmeros de bper  - Dr. Gwen Pounds: [phone]  - Dra. Roseanne Reno: [phone]  - Dr. Katrinka Blazing: [phone]   En caso de inclemencias del tiempo, por favor llame a Lacy Duverney principal al [phone] para una actualizacin sobre el [place] de cualquier retraso o cierre.  Consejos para la medicacin en dermatologa: Por favor, guarde las cajas en las que vienen los medicamentos de uso tpico para ayudarle a seguir las instrucciones sobre dnde y cmo usarlos. Las farmacias generalmente imprimen las instrucciones del medicamento slo en las cajas y no directamente en los tubos del [place].   Si su medicamento es muy caro, por favor, pngase en contacto con Rolm Gala llamando al [phone] y presione la opcin 4 o envenos un mensaje a travs de Clinical cytogeneticist.   No podemos decirle cul ser su copago por los medicamentos por adelantado ya que esto es diferente dependiendo de la cobertura de su seguro. Sin embargo, es posible que podamos encontrar un medicamento sustituto a Audiological scientist un formulario  para que el seguro cubra el medicamento que se considera necesario.   Si se requiere una autorizacin previa para que su compaa de seguros [place] su medicamento, por favor permtanos de 1 a 2 das hbiles para completar [address]  Los precios de los medicamentos varan con frecuencia dependiendo del Environmental consultant de dnde se surte la receta y alguna farmacias pueden ofrecer precios ms baratos.  El sitio web www.goodrx.com tiene cupones para medicamentos de Health and safety inspector. Los precios aqu no tienen en cuenta lo que podra costar con la ayuda del seguro (puede ser ms barato con su seguro), pero el sitio web puede darle el precio si no utiliz Tourist information centre manager.  - Puede imprimir el cupn correspondiente y llevarlo con su receta a la farmacia.  - Tambin puede pasar por nuestra oficina durante el horario de atencin regular y Education officer, museum una tarjeta de cupones de GoodRx.  - Si necesita que su receta se enve electrnicamente a Psychiatrist, informe a nuestra oficina a travs  de MyChart de [place] o por telfono llamando al [phone] y presione la opcin 4.

## 2023-07-25 NOTE — Progress Notes (Signed)
   New Patient Visit   Subjective  Tiffany Coleman is a 67 y.o. female who presents for the following: Knot on the right forearm x 1 year, painful at times, no drainage. She has a history of knot in the groin, went to ED and had area lanced. Area is improved, but would like checked. She has a "blackhead" on the superior buttocks/lower back area, present for 1 year, painful. The patient also has light spots on the thighs that she noticed 1 year so, not bothersome, possibly getting larger.   The patient has spots, moles and lesions to be evaluated, some may be new or changing.   The following portions of the chart were reviewed this encounter and updated as appropriate: medications, allergies, medical history  Review of Systems:  No other skin or systemic complaints except as noted in HPI or Assessment and Plan.  Objective  Well appearing patient in no apparent distress; mood and affect are within normal limits.  A focused examination was performed of the following areas: Arms, legs, groin, trunk Relevant physical exam findings are noted in the Assessment and Plan.  Right Lower Back, mid back, left lateral thigh Dilated pores.     Assessment & Plan   Dilated pore of [place] Right Lower Back, mid back, left lateral thigh  Painful, irritated.   Acne/Milia surgery - Right Lower Back, mid back, left lateral thigh Procedure risks and benefits were discussed with the patient and verbal consent was obtained. Following prep of the skin on the back, left lateral thigh with an alcohol swab and local anesthesia injection, extraction of dilated pore was performed with a comedone extractor. Vaseline ointment was applied to each site. The patient tolerated the procedure well.  EIC (epidermal inclusion cyst)  Idiopathic guttate hypomelanosis  EPIDERMAL INCLUSION CYST VS LIPOMA Exam: Subcutaneous nodule at right forearm  Benign-appearing. Exam most consistent with an epidermal inclusion  cyst. Discussed that a cyst is a benign growth that can grow over time and sometimes get irritated or inflamed. Recommend observation if it is not bothersome. Discussed option of surgical excision to remove it if it is growing, symptomatic, or other changes noted. Please call for new or changing lesions so they can be evaluated.   Idiopathic Guttate Hypomelanosis (IGH)  Exam: scattered small hypopigmented macules on the bilateral thighs  IGH is a benign chronic condition of sun-exposed skin, most commonly affecting the arms and legs. Cause is unknown but it can be a genetic trait. It is not related to vitiligo. There is no treatment. Recommend photoprotection and regular use of spf 30 or higher sunscreen which may help prevent the development of more white spots.  Treatment Plan:  Benign, observe.       Return if symptoms worsen or fail to improve.  Wendee Beavers, CMA, am acting as scribe for Elie Goody, MD .   Documentation: I have reviewed the above documentation for accuracy and completeness, and I agree with the above.  Elie Goody, MD

## 2023-07-26 ENCOUNTER — Ambulatory Visit (HOSPITAL_COMMUNITY): Payer: 59

## 2023-08-28 ENCOUNTER — Telehealth: Payer: Self-pay | Admitting: Pulmonary Disease

## 2023-08-28 DIAGNOSIS — J849 Interstitial pulmonary disease, unspecified: Secondary | ICD-10-CM

## 2023-08-28 DIAGNOSIS — J984 Other disorders of lung: Secondary | ICD-10-CM

## 2023-08-28 NOTE — Telephone Encounter (Signed)
 Patient would like to schedule pulmonary rehab. States not in patient's chart. Patient phone number is [phone].

## 2023-09-04 NOTE — Telephone Encounter (Signed)
Would you like to reorder Pulmonary Rehab Patient did not attend when ordered in September and the order was closed by Pulm Rehab?

## 2023-09-07 NOTE — Telephone Encounter (Signed)
Yes. Please place order for pulmonary rehab

## 2023-09-10 ENCOUNTER — Telehealth (HOSPITAL_COMMUNITY): Payer: Self-pay

## 2023-09-10 NOTE — Telephone Encounter (Signed)
Received referral from Dr. Isaiah Serge for this pt to participate in Pulmonary Rehab with the diagnosis of ILD & pneumonitis. Clinical review of pt follow up appt on 04/17/2023 Pulmonary office note. Pt appropriate for scheduling for Pulmonary rehab. Will forward to support staff for scheduling and verification of insurance eligibility/benefits with pt consent.   Tiffany Coleman Cardiac and Pulmonary Rehab

## 2023-09-10 NOTE — Telephone Encounter (Signed)
Called patient to see if she was interested in participating in the Pulmonary Rehab Program. Patient stated yes. Patient will come in for orientation on 09/26/23 and will attend the 1:15pm exercise class.  Pensions consultant.

## 2023-09-10 NOTE — Telephone Encounter (Signed)
Pulm rehab referral was placed  I called and spoke with the pt and notified her that this was done  Nothing further needed

## 2023-09-12 ENCOUNTER — Telehealth (HOSPITAL_COMMUNITY): Payer: Self-pay

## 2023-09-12 NOTE — Telephone Encounter (Signed)
Pt insurance is active and benefits verified through [facility] Dual Complete. Co-pay $0.00, DED $257.00/$0.00 met, out of pocket $9,350.00/$0.00 met, co-insurance 20%. No pre-authorization required. [place], 09/12/23 @ 9:59AM, ZOX#[id number]

## 2023-09-20 ENCOUNTER — Other Ambulatory Visit (HOSPITAL_BASED_OUTPATIENT_CLINIC_OR_DEPARTMENT_OTHER): Payer: Self-pay | Admitting: Internal Medicine

## 2023-09-20 ENCOUNTER — Other Ambulatory Visit: Payer: Self-pay | Admitting: Internal Medicine

## 2023-09-20 DIAGNOSIS — Z1231 Encounter for screening mammogram for malignant neoplasm of breast: Secondary | ICD-10-CM

## 2023-09-20 DIAGNOSIS — E2839 Other primary ovarian failure: Secondary | ICD-10-CM

## 2023-09-24 ENCOUNTER — Other Ambulatory Visit (HOSPITAL_BASED_OUTPATIENT_CLINIC_OR_DEPARTMENT_OTHER): Payer: Self-pay | Admitting: Internal Medicine

## 2023-09-24 DIAGNOSIS — E2839 Other primary ovarian failure: Secondary | ICD-10-CM

## 2023-09-26 ENCOUNTER — Encounter (HOSPITAL_COMMUNITY): Payer: Self-pay

## 2023-09-26 ENCOUNTER — Inpatient Hospital Stay (HOSPITAL_COMMUNITY): Admission: RE | Admit: 2023-09-26 | Payer: 59 | Source: Ambulatory Visit

## 2023-09-26 NOTE — Progress Notes (Signed)
Pt called and left message that she could not make her Pulmonary rehab appointment today. Her appointment was rescheduled for 09/28/23 @10 :30.

## 2023-09-28 ENCOUNTER — Encounter (HOSPITAL_COMMUNITY)
Admission: RE | Admit: 2023-09-28 | Discharge: 2023-09-28 | Disposition: A | Discharge status: Home / Self Care | Payer: 59 | Source: Ambulatory Visit | Attending: Pulmonary Disease | Admitting: Pulmonary Disease

## 2023-09-28 ENCOUNTER — Encounter (HOSPITAL_COMMUNITY): Payer: Self-pay

## 2023-09-28 VITALS — BP 114/70 | HR 80 | Ht 63.0 in | Wt 174.2 lb

## 2023-09-28 DIAGNOSIS — J849 Interstitial pulmonary disease, unspecified: Secondary | ICD-10-CM | POA: Insufficient documentation

## 2023-09-28 DIAGNOSIS — J984 Other disorders of lung: Secondary | ICD-10-CM | POA: Diagnosis present

## 2023-09-28 NOTE — Progress Notes (Signed)
Pulmonary Rehab Orientation Physical Assessment Note    Well appearing, A&Ox4, NAD Eyes/Ears: WNL Lungs: Clear with no wheezes, rales, rhonchi, denies chronic cough, dyspnea on exertion, wearing 2L [place] continuously Regular rate rhythm, no murmurs, no rubs, no clicks Gastrointestinal: abdomin soft, + bowel sounds in all 4 quads, denies recent weight gain or loss, endorses normal BMs Genitourinary: WNL, pt denies s/s Extremities:  +2 pulses, grip strength equal, strong, no edema, no cyanosis, no clubbing Integumentary: pt denies any rashes, open or non healing wounds Psy/Soc: Pt denies any psy/soc needs or concerns Assistive devices: Pt carriers portable oxygen concentrator (POC), has home concentrator, and back up tanks

## 2023-09-28 NOTE — Progress Notes (Signed)
Tiffany Coleman 68 y.o. female Pulmonary Rehab Orientation Note This patient who was referred to Pulmonary Rehab by Dr. Isaiah Serge with the diagnosis of Pneumonitis, ILD arrived today in Cardiac and Pulmonary Rehab. She  arrived ambulatory with normal gait. She  does carry portable oxygen. [employer] is the provider for their DME. Per patient, Tiffany Coleman uses oxygen continuously. Color good, skin warm and dry. Patient is oriented to time and place. Patient's medical history, psychosocial health, and medications reviewed. Psychosocial assessment reveals patient lives with family. Tiffany Coleman is currently retired. Patient hobbies include watching tv and cooking. Patient reports her stress level is low. Areas of stress/anxiety include health and family . Patient does not exhibit signs of depression. PHQ2/9 score 1/3. Tiffany Coleman shows good  coping skills with positive outlook on life. Offered emotional support and reassurance. Will continue to monitor. Physical assessment performed by Essie Hart RN. Please see their orientation physical assessment note. Tiffany Coleman reports she does take medications as prescribed. Patient states she follows a regular  diet. The patient reports no specific efforts to gain or lose weight.. Patient's weight will be monitored closely. Demonstration and practice of PLB using pulse oximeter. Tiffany Coleman able to return demonstration satisfactorily. Safety and hand hygiene in the exercise area reviewed with patient. Tiffany Coleman voices understanding of the information reviewed. Department expectations discussed with patient and achievable goals were set. The patient shows enthusiasm about attending the program and we look forward to working with Tiffany Coleman. Tiffany Coleman completed a 6 min walk test today and is scheduled to begin exercise on 10/02/23 at 1:15 pm.   [id number] Tiffany San, MS, ACSM-CEP

## 2023-09-28 NOTE — Progress Notes (Signed)
Pulmonary Individual Treatment Plan  Patient Details  Name: Tiffany Coleman MRN: [id number] Date of Birth: 1955/10/17 Referring Provider:   Doristine Devoid Pulmonary Rehab Walk Test from 09/28/2023 in [facility] for Heart, Vascular, & Lung Health  Referring Provider [name]       Initial Encounter Date:  Flowsheet Row Pulmonary Rehab Walk Test from 09/28/2023 in [facility] for Heart, Vascular, & Lung Health  Date 09/28/23       Visit Diagnosis: Pneumonitis  ILD (interstitial lung disease) (HCC)  Patient's Home Medications on Admission:   Current Outpatient Medications:    acetaminophen (TYLENOL) 325 MG tablet, Take 2 tablets (650 mg total) by mouth every 6 (six) hours as needed for mild pain or headache (fever >/= 101)., Disp: , Rfl:    albuterol (2.5 MG/3ML) 0.083% NEBU 3 mL, albuterol (5 MG/ML) 0.5% NEBU 0.5 mL, Inhale 5 mg into the lungs once a week., Disp: , Rfl:    albuterol (VENTOLIN HFA) 108 (90 Base) MCG/ACT inhaler, Inhale 2 puffs into the lungs every 6 (six) hours as needed for wheezing or shortness of breath., Disp: 1 each, Rfl: 2   azaTHIOprine (IMURAN) 50 MG tablet, TAKE 3 TABLETS(150 MG) BY MOUTH DAILY, Disp: 90 tablet, Rfl: 5   diclofenac Sodium (VOLTAREN) 1 % GEL, SMARTSIG:4 Gram(s) Topical 4 Times Daily PRN, Disp: , Rfl:    fluticasone (FLONASE) 50 MCG/ACT nasal spray, Place 2 sprays into both nostrils daily. (Patient taking differently: Place 2 sprays into both nostrils daily as needed for rhinitis.), Disp: , Rfl: 2   furosemide (LASIX) 20 MG tablet, Take 1 tablet (20 mg total) by mouth 2 (two) times daily. NEEDS FOLLOW UP APPOINTMENT FOR MORE REFILLS, Disp: 90 tablet, Rfl: 0   hydrOXYzine (ATARAX) 25 MG tablet, Take 25 mg by mouth 2 (two) times daily as needed. for anxiety, Disp: , Rfl:    ibuprofen (ADVIL) 800 MG tablet, Take 1 tablet (800 mg total) by mouth 3 (three) times daily., Disp: 21 tablet, Rfl: 0    ipratropium-albuterol (DUONEB) 0.5-2.5 (3) MG/3ML SOLN, Take 3 mLs by nebulization every 6 (six) hours as needed., Disp: 360 mL, Rfl: 5   MOUNJARO 2.5 MG/0.5ML Pen, SMARTSIG:2.5 Milligram(s) SUB-Q Once a Week, Disp: , Rfl:    ondansetron (ZOFRAN-ODT) 4 MG disintegrating tablet, Take 1 tablet (4 mg total) by mouth every 8 (eight) hours as needed., Disp: 8 tablet, Rfl: 0   pantoprazole (PROTONIX) 40 MG tablet, Take 1 tablet (40 mg total) by mouth daily at 6 (six) AM., Disp: 30 tablet, Rfl: 2   potassium chloride (KLOR-CON M) 10 MEQ tablet, Take 1 tablet (10 mEq total) by mouth daily. Absolute last refill without office visit please call [phone] to schedule, Disp: 30 tablet, Rfl: 0   predniSONE (DELTASONE) 2.5 MG tablet, Take 2.5mg  daily x's 2 weeks then 2.5mg  every other day x's 2 weeks, Disp: 21 tablet, Rfl: 0   promethazine-dextromethorphan (PROMETHAZINE-DM) 6.25-15 MG/5ML syrup, Take 10 mLs by mouth 2 (two) times daily as needed for cough., Disp: , Rfl:    SYMBICORT 160-4.5 MCG/ACT inhaler, SMARTSIG:2 Puff(s) By Mouth Twice Daily, Disp: , Rfl:    zolpidem (AMBIEN) 10 MG tablet, Take 10 mg by mouth at bedtime as needed for sleep., Disp: , Rfl:   Past Medical History: Past Medical History:  Diagnosis Date   Anemia yrs ago   Arthritis    knees   Bronchitis    hx   CHF (congestive heart  failure) (HCC)    Complication of anesthesia    trouble breathing when wake up needs breathing tx when wakes up   Hx of colonic polyps 2015   Obstructive sleep apnea 06/02/2016   No CPAP used   Recurrent ventral hernia 09/08/2013   Umbilical hernia     Tobacco Use: Social History   Tobacco Use  Smoking Status Former   Current packs/day: 0.00   Types: Cigarettes   Start date: 05/26/2000   Quit date: 05/26/2020   Years since quitting: 3.3   Passive exposure: Past  Smokeless Tobacco Never    Labs: Review Flowsheet  More data exists      Latest Ref Rng & Units 04/05/2020 04/25/2020 05/20/2021  10/10/2021 10/11/2022  Labs for ITP Cardiac and Pulmonary Rehab  Cholestrol <200 mg/dL - - - 403  474   LDL (calc) mg/dL (calc) - - - 259  98   HDL-C > OR = 50 mg/dL - - - 69  58   Trlycerides <150 mg/dL 563  - - 83  875   Hemoglobin A1c 4.8 - 5.6 % - 6.7  - - -  Bicarbonate 20.0 - 28.0 mmol/L 26.4  - 32.6  - -  TCO2 22 - 32 mmol/L - - 34  32  33  - -  O2 Saturation % 38.5  - 74.0  - -    Details       Multiple values from one day are sorted in reverse-chronological order         Capillary Blood Glucose: Lab Results  Component Value Date   GLUCAP 78 06/02/2023   GLUCAP 122 (H) 05/22/2023   GLUCAP 69 (L) 05/22/2023   GLUCAP 111 (H) 05/08/2020   GLUCAP 176 (H) 05/07/2020     Pulmonary Assessment Scores:  Pulmonary Assessment Scores     Row Name 09/28/23 1120         ADL UCSD   ADL Phase Entry     SOB Score total 16       CAT Score   CAT Score 13       mMRC Score   mMRC Score 2             UCSD: Self-administered rating of dyspnea associated with activities of daily living (ADLs) 6-point scale (0 = "not at all" to 5 = "maximal or unable to do because of breathlessness")  Scoring Scores range from 0 to 120.  Minimally important difference is 5 units  CAT: CAT can identify the health impairment of COPD patients and is better correlated with disease progression.  CAT has a scoring range of zero to 40. The CAT score is classified into four groups of low (less than 10), medium (10 - 20), high (21-30) and very high (31-40) based on the impact level of disease on health status. A CAT score over 10 suggests significant symptoms.  A worsening CAT score could be explained by an exacerbation, poor medication adherence, poor inhaler technique, or progression of COPD or comorbid conditions.  CAT MCID is 2 points  mMRC: mMRC (Modified Medical Research Council) Dyspnea Scale is used to assess the degree of baseline functional disability in patients of respiratory disease  due to dyspnea. No minimal important difference is established. A decrease in score of 1 point or greater is considered a positive change.   Pulmonary Function Assessment:  Pulmonary Function Assessment - 09/28/23 1044       Breath   Bilateral Breath Sounds Clear  Shortness of Breath Yes;Limiting activity;Fear of Shortness of Breath             Exercise Target Goals: Exercise Program Goal: Individual exercise prescription set using results from initial 6 min walk test and THRR while considering  patient's activity barriers and safety.   Exercise Prescription Goal: Initial exercise prescription builds to 30-45 minutes a day of aerobic activity, 2-3 days per week.  Home exercise guidelines will be given to patient during program as part of exercise prescription that the participant will acknowledge.  Activity Barriers & Risk Stratification:   6 Minute Walk:  6 Minute Walk     Row Name 09/28/23 1130         6 Minute Walk   Phase Initial     Distance 1140 feet     Walk Time 6 minutes     # of Rest Breaks 0     MPH 2.16     METS 2.52     RPE 9     Perceived Dyspnea  1     VO2 Peak 8.81     Symptoms No     Resting HR 80 bpm     Resting BP 114/70     Resting Oxygen Saturation  100 %     Exercise Oxygen Saturation  during 6 min walk 92 %     Max Ex. HR 102 bpm     Max Ex. BP 140/64     2 Minute Post BP 130/70       Interval HR   1 Minute HR 89     2 Minute HR 101     3 Minute HR 98     4 Minute HR 102     5 Minute HR 100     6 Minute HR 101     2 Minute Post HR 75     Interval Heart Rate? Yes       Interval Oxygen   Interval Oxygen? Yes     Baseline Oxygen Saturation % 100 %     1 Minute Oxygen Saturation % 100 %     1 Minute Liters of Oxygen 2 L     2 Minute Oxygen Saturation % 97 %     2 Minute Liters of Oxygen 2 L     3 Minute Oxygen Saturation % 98 %     3 Minute Liters of Oxygen 2 L     4 Minute Oxygen Saturation % 94 %     4 Minute Liters of  Oxygen 2 L     5 Minute Oxygen Saturation % 93 %     5 Minute Liters of Oxygen 2 L     6 Minute Oxygen Saturation % 92 %     6 Minute Liters of Oxygen 2 L     2 Minute Post Oxygen Saturation % 100 %     2 Minute Post Liters of Oxygen 2 L              Oxygen Initial Assessment:  Oxygen Initial Assessment - 09/28/23 1044       Home Oxygen   Home Oxygen Device E-Tanks;Home Concentrator    Sleep Oxygen Prescription Continuous    Liters per minute 2    Home Exercise Oxygen Prescription Pulsed    Liters per minute 2    Home Resting Oxygen Prescription Pulsed    Liters per minute 2    Compliance with Home Oxygen Use Yes  Initial 6 min Walk   Oxygen Used Continuous    Liters per minute 2      Program Oxygen Prescription   Program Oxygen Prescription Continuous    Liters per minute 2      Intervention   Short Term Goals To learn and exhibit compliance with exercise, home and travel O2 prescription;To learn and understand importance of monitoring SPO2 with pulse oximeter and demonstrate accurate use of the pulse oximeter.;To learn and understand importance of maintaining oxygen saturations>88%;To learn and demonstrate proper use of respiratory medications;To learn and demonstrate proper pursed lip breathing techniques or other breathing techniques.     Long  Term Goals Exhibits compliance with exercise, home  and travel O2 prescription;Verbalizes importance of monitoring SPO2 with pulse oximeter and return demonstration;Maintenance of O2 saturations>88%;Exhibits proper breathing techniques, such as pursed lip breathing or other method taught during program session;Compliance with respiratory medication;Demonstrates proper use of MDI's             Oxygen Re-Evaluation:   Oxygen Discharge (Final Oxygen Re-Evaluation):   Initial Exercise Prescription:  Initial Exercise Prescription - 09/28/23 1200       Date of Initial Exercise RX and Referring Provider   Date 09/28/23     Referring Provider [name]    Expected Discharge Date 12/20/23      Oxygen   Oxygen Continuous    Liters 2    Maintain Oxygen Saturation 88% or higher      Recumbant Bike   Level 2    RPM 70    Minutes 15    METs 2      Recumbant Elliptical   Level 2    RPM 70    Minutes 15    METs 2      Prescription Details   Frequency (times per week) 2    Duration Progress to 30 minutes of continuous aerobic without signs/symptoms of physical distress      Intensity   THRR 40-80% of Max Heartrate 60-121    Ratings of Perceived Exertion 11-13    Perceived Dyspnea 0-4      Progression   Progression Continue to progress workloads to maintain intensity without signs/symptoms of physical distress.      Resistance Training   Training Prescription Yes    Weight red bands    Reps 10-15             Perform Capillary Blood Glucose checks as needed.  Exercise Prescription Changes:   Exercise Comments:   Exercise Goals and Review:   Exercise Goals     Row Name 09/28/23 1042             Exercise Goals   Increase Physical Activity Yes       Intervention Provide advice, education, support and counseling about physical activity/exercise needs.;Develop an individualized exercise prescription for aerobic and resistive training based on initial evaluation findings, risk stratification, comorbidities and participant's personal goals.       Expected Outcomes Short Term: Attend rehab on a regular basis to increase amount of physical activity.;Long Term: Add in home exercise to make exercise part of routine and to increase amount of physical activity.;Long Term: Exercising regularly at least 3-5 days a week.       Increase Strength and Stamina Yes       Intervention Provide advice, education, support and counseling about physical activity/exercise needs.;Develop an individualized exercise prescription for aerobic and resistive training based on initial evaluation findings, risk  stratification, comorbidities  and participant's personal goals.       Expected Outcomes Short Term: Increase workloads from initial exercise prescription for resistance, speed, and METs.;Short Term: Perform resistance training exercises routinely during rehab and add in resistance training at home;Long Term: Improve cardiorespiratory fitness, muscular endurance and strength as measured by increased METs and functional capacity ( )       Able to understand and use rate of perceived exertion (RPE) scale Yes       Intervention Provide education and explanation on how to use RPE scale       Expected Outcomes Short Term: Able to use RPE daily in rehab to express subjective intensity level;Long Term:  Able to use RPE to guide intensity level when exercising independently       Able to understand and use Dyspnea scale Yes       Intervention Provide education and explanation on how to use Dyspnea scale       Expected Outcomes Short Term: Able to use Dyspnea scale daily in rehab to express subjective sense of shortness of breath during exertion;Long Term: Able to use Dyspnea scale to guide intensity level when exercising independently       Knowledge and understanding of Target Heart Rate Range (THRR) Yes       Intervention Provide education and explanation of THRR including how the numbers were predicted and where they are located for reference       Expected Outcomes Long Term: Able to use THRR to govern intensity when exercising independently;Short Term: Able to state/look up THRR;Short Term: Able to use daily as guideline for intensity in rehab       Understanding of Exercise Prescription Yes       Intervention Provide education, explanation, and written materials on patient's individual exercise prescription       Expected Outcomes Short Term: Able to explain program exercise prescription;Long Term: Able to explain home exercise prescription to exercise independently                Exercise Goals  Re-Evaluation :  Exercise Goals Re-Evaluation     Row Name 09/28/23 1043             Exercise Goal Re-Evaluation   Exercise Goals Review Increase Physical Activity;Able to understand and use Dyspnea scale;Understanding of Exercise Prescription;Increase Strength and Stamina;Knowledge and understanding of Target Heart Rate Range (THRR);Able to understand and use rate of perceived exertion (RPE) scale       Comments Albert is scheduled to exercise again on 2/18. Will continue to monitor and progress as able.       Expected Outcomes Through exercise at rehab and home the patient will decrease shortness of breath with daily activities and feel confident in carrying out and exercise regimn at home.                Discharge Exercise Prescription (Final Exercise Prescription Changes):   Nutrition:  Target Goals: Understanding of nutrition guidelines, daily intake of sodium 1500mg , cholesterol 200mg , calories 30% from fat and 7% or less from saturated fats, daily to have 5 or more servings of fruits and vegetables.  Biometrics:  Pre Biometrics - 09/28/23 1215       Pre Biometrics   Grip Strength 21 kg              Nutrition Therapy Plan and Nutrition Goals:   Nutrition Assessments:  MEDIFICTS Score Key: >=70 Need to make dietary changes  40-70 Heart Healthy Diet <= 40 Therapeutic  Level Cholesterol Diet  Flowsheet Row PULMONARY REHAB OTHER RESPIRATORY from 10/14/2020 in [facility] for Heart, Vascular, & Lung Health  Picture Your Plate Total Score on Admission 55      Picture Your Plate Scores: <40 Unhealthy dietary pattern with much room for improvement. 41-50 Dietary pattern unlikely to meet recommendations for good health and room for improvement. 51-60 More healthful dietary pattern, with some room for improvement.  >60 Healthy dietary pattern, although there may be some specific behaviors that could be improved.    Nutrition Goals  Re-Evaluation:   Nutrition Goals Discharge (Final Nutrition Goals Re-Evaluation):   Psychosocial: Target Goals: Acknowledge presence or absence of significant depression and/or stress, maximize coping skills, provide positive support system. Participant is able to verbalize types and ability to use techniques and skills needed for reducing stress and depression.  Initial Review & Psychosocial Screening:  Initial Psych Review & Screening - 09/28/23 1045       Initial Review   Current issues with None Identified    Comments Pt denies any psychosocial barriers at this time.      Family Dynamics   Good Support System? Yes    Comments Pt states she is in contact with children      Screening Interventions   Interventions Encouraged to exercise             Quality of Life Scores:  Scores of 19 and below usually indicate a poorer quality of life in these areas.  A difference of  2-3 points is a clinically meaningful difference.  A difference of 2-3 points in the total score of the Quality of Life Index has been associated with significant improvement in overall quality of life, self-image, physical symptoms, and general health in studies assessing change in quality of life.  PHQ-9: Review Flowsheet  More data exists      09/28/2023 08/11/2022 11/23/2020 10/01/2020 11/02/2017  Depression screen PHQ 2/9  Decreased Interest 0 0 1 0 0  Down, Depressed, Hopeless 1 0 0 2 0  PHQ - 2 Score 1 0 1 2 0  Altered sleeping 1 0 0 2 -  Tired, decreased energy 1 3 1 3  -  Change in appetite 0 0 0 0 -  Feeling bad or failure about yourself  0 0 0 0 -  Trouble concentrating 0 0 0 0 -  Moving slowly or fidgety/restless 0 0 0 0 -  Suicidal thoughts 0 0 0 0 -  PHQ-9 Score 3 3 2 7  -  Difficult doing work/chores Not difficult at all Not difficult at all Somewhat difficult Somewhat difficult -   Interpretation of Total Score  Total Score Depression Severity:  1-4 = Minimal depression, 5-9 = Mild  depression, 10-14 = Moderate depression, 15-19 = Moderately severe depression, 20-27 = Severe depression   Psychosocial Evaluation and Intervention:  Psychosocial Evaluation - 09/28/23 1047       Psychosocial Evaluation & Interventions   Interventions Encouraged to exercise with the program and follow exercise prescription    Comments Pt denies any psychosocial barriers at this time.    Expected Outcomes For Melaysia to participate in rehab free of psychosocial barriers.    Continue Psychosocial Services  No Follow up required             Psychosocial Re-Evaluation:   Psychosocial Discharge (Final Psychosocial Re-Evaluation):   Education: Education Goals: Education classes will be provided on a weekly basis, covering required topics. Participant will state  understanding/return demonstration of topics presented.  Learning Barriers/Preferences:   Education Topics: Know Your Numbers Group instruction that is supported by a PowerPoint presentation. Instructor discusses importance of knowing and understanding resting, exercise, and post-exercise oxygen saturation, heart rate, and blood pressure. Oxygen saturation, heart rate, blood pressure, rating of perceived exertion, and dyspnea are reviewed along with a normal range for these values.    Exercise for the Pulmonary Patient Group instruction that is supported by a PowerPoint presentation. Instructor discusses benefits of exercise, core components of exercise, frequency, duration, and intensity of an exercise routine, importance of utilizing pulse oximetry during exercise, safety while exercising, and options of places to exercise outside of rehab.    MET Level  Group instruction provided by PowerPoint, verbal discussion, and written material to support subject matter. Instructor reviews what METs are and how to increase METs.    Pulmonary Medications Verbally interactive group education provided by instructor with focus on  inhaled medications and proper administration.   Anatomy and Physiology of the Respiratory System Group instruction provided by PowerPoint, verbal discussion, and written material to support subject matter. Instructor reviews respiratory cycle and anatomical components of the respiratory system and their functions. Instructor also reviews differences in obstructive and restrictive respiratory diseases with examples of each.    Oxygen Safety Group instruction provided by PowerPoint, verbal discussion, and written material to support subject matter. There is an overview of "What is Oxygen" and "Why do we need it".  Instructor also reviews how to create a safe environment for oxygen use, the importance of using oxygen as prescribed, and the risks of noncompliance. There is a brief discussion on traveling with oxygen and resources the patient may utilize. Flowsheet Row PULMONARY REHAB OTHER RESPIRATORY from 11/11/2020 in [facility] for Heart, Vascular, & Lung Health  Date 10/14/20  Educator Handout       Oxygen Use Group instruction provided by PowerPoint, verbal discussion, and written material to discuss how supplemental oxygen is prescribed and different types of oxygen supply systems. Resources for more information are provided.    Breathing Techniques Group instruction that is supported by demonstration and informational handouts. Instructor discusses the benefits of pursed lip and diaphragmatic breathing and detailed demonstration on how to perform both.     Risk Factor Reduction Group instruction that is supported by a PowerPoint presentation. Instructor discusses the definition of a risk factor, different risk factors for pulmonary disease, and how the heart and lungs work together.   Pulmonary Diseases Group instruction provided by PowerPoint, verbal discussion, and written material to support subject matter. Instructor gives an overview of the different type  of pulmonary diseases. There is also a discussion on risk factors and symptoms as well as ways to manage the diseases.   Stress and Energy Conservation Group instruction provided by PowerPoint, verbal discussion, and written material to support subject matter. Instructor gives an overview of stress and the impact it can have on the body. Instructor also reviews ways to reduce stress. There is also a discussion on energy conservation and ways to conserve energy throughout the day.   Warning Signs and Symptoms Group instruction provided by PowerPoint, verbal discussion, and written material to support subject matter. Instructor reviews warning signs and symptoms of stroke, heart attack, cold and flu. Instructor also reviews ways to prevent the spread of infection.   Other Education Group or individual verbal, written, or video instructions that support the educational goals of the pulmonary rehab program. [employer]  PULMONARY REHAB OTHER RESPIRATORY from 11/11/2020 in [facility] for Heart, Vascular, & Lung Health  Date 11/11/20  [facility] Plate]  Educator handout  [How to beat a sedentary lifestyle]  Instruction Review Code 1- Verbalizes Understanding        Knowledge Questionnaire Score:  Knowledge Questionnaire Score - 09/28/23 1118       Knowledge Questionnaire Score   Pre Score 13/18             Core Components/Risk Factors/Patient Goals at Admission:  Personal Goals and Risk Factors at Admission - 09/28/23 1045       Core Components/Risk Factors/Patient Goals on Admission   Improve shortness of breath with ADL's Yes    Intervention Provide education, individualized exercise plan and daily activity instruction to help decrease symptoms of SOB with activities of daily living.    Expected Outcomes Short Term: Improve cardiorespiratory fitness to achieve a reduction of symptoms when performing ADLs;Long Term: Be able to perform more ADLs without symptoms  or delay the onset of symptoms             Core Components/Risk Factors/Patient Goals Review:    Core Components/Risk Factors/Patient Goals at Discharge (Final Review):    ITP Comments: Dr. Mechele Collin is Medical Director for Pulmonary Rehab at [facility].

## 2023-10-02 ENCOUNTER — Encounter (HOSPITAL_COMMUNITY): Admission: RE | Admit: 2023-10-02 | Payer: 59 | Source: Ambulatory Visit

## 2023-10-03 NOTE — Progress Notes (Signed)
Pulmonary Individual Treatment Plan  Patient Details  Name: Tiffany Coleman MRN: [id number] Date of Birth: [birth year] Referring Provider:   Doristine Devoid Pulmonary Rehab Walk Test from 09/28/2023 in [facility] for Heart, Vascular, & Lung Health  Referring Provider [name]       Initial Encounter Date:  Flowsheet Row Pulmonary Rehab Walk Test from 09/28/2023 in [facility] for Heart, Vascular, & Lung Health  Date 09/28/23       Visit Diagnosis: Pneumonitis  ILD (interstitial lung disease) (HCC)  Patient's Home Medications on Admission:   Current Outpatient Medications:    acetaminophen (TYLENOL) 325 MG tablet, Take 2 tablets (650 mg total) by mouth every 6 (six) hours as needed for mild pain or headache (fever >/= 101)., Disp: , Rfl:    albuterol (2.5 MG/3ML) 0.083% NEBU 3 mL, albuterol (5 MG/ML) 0.5% NEBU 0.5 mL, Inhale 5 mg into the lungs once a week., Disp: , Rfl:    albuterol (VENTOLIN HFA) 108 (90 Base) MCG/ACT inhaler, Inhale 2 puffs into the lungs every 6 (six) hours as needed for wheezing or shortness of breath., Disp: 1 each, Rfl: 2   azaTHIOprine (IMURAN) 50 MG tablet, TAKE 3 TABLETS(150 MG) BY MOUTH DAILY, Disp: 90 tablet, Rfl: 5   diclofenac Sodium (VOLTAREN) 1 % GEL, SMARTSIG:4 Gram(s) Topical 4 Times Daily PRN, Disp: , Rfl:    fluticasone (FLONASE) 50 MCG/ACT nasal spray, Place 2 sprays into both nostrils daily. (Patient taking differently: Place 2 sprays into both nostrils daily as needed for rhinitis.), Disp: , Rfl: 2   furosemide (LASIX) 20 MG tablet, Take 1 tablet (20 mg total) by mouth 2 (two) times daily. NEEDS FOLLOW UP APPOINTMENT FOR MORE REFILLS, Disp: 90 tablet, Rfl: 0   hydrOXYzine (ATARAX) 25 MG tablet, Take 25 mg by mouth 2 (two) times daily as needed. for anxiety, Disp: , Rfl:    ibuprofen (ADVIL) 800 MG tablet, Take 1 tablet (800 mg total) by mouth 3 (three) times daily., Disp: 21 tablet, Rfl: 0    ipratropium-albuterol (DUONEB) 0.5-2.5 (3) MG/3ML SOLN, Take 3 mLs by nebulization every 6 (six) hours as needed., Disp: 360 mL, Rfl: 5   MOUNJARO 2.5 MG/0.5ML Pen, SMARTSIG:2.5 Milligram(s) SUB-Q Once a Week, Disp: , Rfl:    ondansetron (ZOFRAN-ODT) 4 MG disintegrating tablet, Take 1 tablet (4 mg total) by mouth every 8 (eight) hours as needed., Disp: 8 tablet, Rfl: 0   pantoprazole (PROTONIX) 40 MG tablet, Take 1 tablet (40 mg total) by mouth daily at 6 (six) AM., Disp: 30 tablet, Rfl: 2   potassium chloride (KLOR-CON M) 10 MEQ tablet, Take 1 tablet (10 mEq total) by mouth daily. Absolute last refill without office visit please call [phone] to schedule, Disp: 30 tablet, Rfl: 0   predniSONE (DELTASONE) 2.5 MG tablet, Take 2.5mg  daily x's 2 weeks then 2.5mg  every other day x's 2 weeks, Disp: 21 tablet, Rfl: 0   promethazine-dextromethorphan (PROMETHAZINE-DM) 6.25-15 MG/5ML syrup, Take 10 mLs by mouth 2 (two) times daily as needed for cough., Disp: , Rfl:    SYMBICORT 160-4.5 MCG/ACT inhaler, SMARTSIG:2 Puff(s) By Mouth Twice Daily, Disp: , Rfl:    zolpidem (AMBIEN) 10 MG tablet, Take 10 mg by mouth at bedtime as needed for sleep., Disp: , Rfl:   Past Medical History: Past Medical History:  Diagnosis Date   Anemia yrs ago   Arthritis    knees   Bronchitis    hx   CHF (congestive heart  failure) (HCC)    Complication of anesthesia    trouble breathing when wake up needs breathing tx when wakes up   Hx of colonic polyps 2015   Obstructive sleep apnea 06/02/2016   No CPAP used   Recurrent ventral hernia 09/08/2013   Umbilical hernia     Tobacco Use: Social History   Tobacco Use  Smoking Status Former   Current packs/day: 0.00   Types: Cigarettes   Start date: 05/26/2000   Quit date: 05/26/2020   Years since quitting: 3.3   Passive exposure: Past  Smokeless Tobacco Never    Labs: Review Flowsheet  More data exists      Latest Ref Rng & Units 04/05/2020 04/25/2020 05/20/2021  10/10/2021 10/11/2022  Labs for ITP Cardiac and Pulmonary Rehab  Cholestrol <200 mg/dL - - - 604  540   LDL (calc) mg/dL (calc) - - - 981  98   HDL-C > OR = 50 mg/dL - - - 69  58   Trlycerides <150 mg/dL 191  - - 83  478   Hemoglobin A1c 4.8 - 5.6 % - 6.7  - - -  Bicarbonate 20.0 - 28.0 mmol/L 26.4  - 32.6  - -  TCO2 22 - 32 mmol/L - - 34  32  33  - -  O2 Saturation % 38.5  - 74.0  - -    Details       Multiple values from one day are sorted in reverse-chronological order         Capillary Blood Glucose: Lab Results  Component Value Date   GLUCAP 78 06/02/2023   GLUCAP 122 (H) 05/22/2023   GLUCAP 69 (L) 05/22/2023   GLUCAP 111 (H) 05/08/2020   GLUCAP 176 (H) 05/07/2020     Pulmonary Assessment Scores:  Pulmonary Assessment Scores     Row Name 09/28/23 1120         ADL UCSD   ADL Phase Entry     SOB Score total 16       CAT Score   CAT Score 13       mMRC Score   mMRC Score 2             UCSD: Self-administered rating of dyspnea associated with activities of daily living (ADLs) 6-point scale (0 = "not at all" to 5 = "maximal or unable to do because of breathlessness")  Scoring Scores range from 0 to 120.  Minimally important difference is 5 units  CAT: CAT can identify the health impairment of COPD patients and is better correlated with disease progression.  CAT has a scoring range of zero to 40. The CAT score is classified into four groups of low (less than 10), medium (10 - 20), high (21-30) and very high (31-40) based on the impact level of disease on health status. A CAT score over 10 suggests significant symptoms.  A worsening CAT score could be explained by an exacerbation, poor medication adherence, poor inhaler technique, or progression of COPD or comorbid conditions.  CAT MCID is 2 points  mMRC: mMRC (Modified Medical Research Council) Dyspnea Scale is used to assess the degree of baseline functional disability in patients of respiratory disease  due to dyspnea. No minimal important difference is established. A decrease in score of 1 point or greater is considered a positive change.   Pulmonary Function Assessment:  Pulmonary Function Assessment - 09/28/23 1044       Breath   Bilateral Breath Sounds Clear  Shortness of Breath Yes;Limiting activity;Fear of Shortness of Breath             Exercise Target Goals: Exercise Program Goal: Individual exercise prescription set using results from initial 6 min walk test and THRR while considering  patient's activity barriers and safety.   Exercise Prescription Goal: Initial exercise prescription builds to 30-45 minutes a day of aerobic activity, 2-3 days per week.  Home exercise guidelines will be given to patient during program as part of exercise prescription that the participant will acknowledge.  Activity Barriers & Risk Stratification:   6 Minute Walk:  6 Minute Walk     Row Name 09/28/23 1130         6 Minute Walk   Phase Initial     Distance 1140 feet     Walk Time 6 minutes     # of Rest Breaks 0     MPH 2.16     METS 2.52     RPE 9     Perceived Dyspnea  1     VO2 Peak 8.81     Symptoms No     Resting HR 80 bpm     Resting BP 114/70     Resting Oxygen Saturation  100 %     Exercise Oxygen Saturation  during 6 min walk 92 %     Max Ex. HR 102 bpm     Max Ex. BP 140/64     2 Minute Post BP 130/70       Interval HR   1 Minute HR 89     2 Minute HR 101     3 Minute HR 98     4 Minute HR 102     5 Minute HR 100     6 Minute HR 101     2 Minute Post HR 75     Interval Heart Rate? Yes       Interval Oxygen   Interval Oxygen? Yes     Baseline Oxygen Saturation % 100 %     1 Minute Oxygen Saturation % 100 %     1 Minute Liters of Oxygen 2 L     2 Minute Oxygen Saturation % 97 %     2 Minute Liters of Oxygen 2 L     3 Minute Oxygen Saturation % 98 %     3 Minute Liters of Oxygen 2 L     4 Minute Oxygen Saturation % 94 %     4 Minute Liters of  Oxygen 2 L     5 Minute Oxygen Saturation % 93 %     5 Minute Liters of Oxygen 2 L     6 Minute Oxygen Saturation % 92 %     6 Minute Liters of Oxygen 2 L     2 Minute Post Oxygen Saturation % 100 %     2 Minute Post Liters of Oxygen 2 L              Oxygen Initial Assessment:  Oxygen Initial Assessment - 09/28/23 1044       Home Oxygen   Home Oxygen Device E-Tanks;Home Concentrator    Sleep Oxygen Prescription Continuous    Liters per minute 2    Home Exercise Oxygen Prescription Pulsed    Liters per minute 2    Home Resting Oxygen Prescription Pulsed    Liters per minute 2    Compliance with Home Oxygen Use Yes  Initial 6 min Walk   Oxygen Used Continuous    Liters per minute 2      Program Oxygen Prescription   Program Oxygen Prescription Continuous    Liters per minute 2      Intervention   Short Term Goals To learn and exhibit compliance with exercise, home and travel O2 prescription;To learn and understand importance of monitoring SPO2 with pulse oximeter and demonstrate accurate use of the pulse oximeter.;To learn and understand importance of maintaining oxygen saturations>88%;To learn and demonstrate proper use of respiratory medications;To learn and demonstrate proper pursed lip breathing techniques or other breathing techniques.     Long  Term Goals Exhibits compliance with exercise, home  and travel O2 prescription;Verbalizes importance of monitoring SPO2 with pulse oximeter and return demonstration;Maintenance of O2 saturations>88%;Exhibits proper breathing techniques, such as pursed lip breathing or other method taught during program session;Compliance with respiratory medication;Demonstrates proper use of MDI's             Oxygen Re-Evaluation:   Oxygen Discharge (Final Oxygen Re-Evaluation):   Initial Exercise Prescription:  Initial Exercise Prescription - 09/28/23 1200       Date of Initial Exercise RX and Referring Provider   Date 09/28/23     Referring Provider [name]    Expected Discharge Date 12/20/23      Oxygen   Oxygen Continuous    Liters 2    Maintain Oxygen Saturation 88% or higher      Recumbant Bike   Level 2    RPM 70    Minutes 15    METs 2      Recumbant Elliptical   Level 2    RPM 70    Minutes 15    METs 2      Prescription Details   Frequency (times per week) 2    Duration Progress to 30 minutes of continuous aerobic without signs/symptoms of physical distress      Intensity   THRR 40-80% of Max Heartrate 60-121    Ratings of Perceived Exertion 11-13    Perceived Dyspnea 0-4      Progression   Progression Continue to progress workloads to maintain intensity without signs/symptoms of physical distress.      Resistance Training   Training Prescription Yes    Weight red bands    Reps 10-15             Perform Capillary Blood Glucose checks as needed.  Exercise Prescription Changes:   Exercise Comments:   Exercise Goals and Review:   Exercise Goals     Row Name 09/28/23 1042             Exercise Goals   Increase Physical Activity Yes       Intervention Provide advice, education, support and counseling about physical activity/exercise needs.;Develop an individualized exercise prescription for aerobic and resistive training based on initial evaluation findings, risk stratification, comorbidities and participant's personal goals.       Expected Outcomes Short Term: Attend rehab on a regular basis to increase amount of physical activity.;Long Term: Add in home exercise to make exercise part of routine and to increase amount of physical activity.;Long Term: Exercising regularly at least 3-5 days a week.       Increase Strength and Stamina Yes       Intervention Provide advice, education, support and counseling about physical activity/exercise needs.;Develop an individualized exercise prescription for aerobic and resistive training based on initial evaluation findings, risk  stratification, comorbidities  and participant's personal goals.       Expected Outcomes Short Term: Increase workloads from initial exercise prescription for resistance, speed, and METs.;Short Term: Perform resistance training exercises routinely during rehab and add in resistance training at home;Long Term: Improve cardiorespiratory fitness, muscular endurance and strength as measured by increased METs and functional capacity ( )       Able to understand and use rate of perceived exertion (RPE) scale Yes       Intervention Provide education and explanation on how to use RPE scale       Expected Outcomes Short Term: Able to use RPE daily in rehab to express subjective intensity level;Long Term:  Able to use RPE to guide intensity level when exercising independently       Able to understand and use Dyspnea scale Yes       Intervention Provide education and explanation on how to use Dyspnea scale       Expected Outcomes Short Term: Able to use Dyspnea scale daily in rehab to express subjective sense of shortness of breath during exertion;Long Term: Able to use Dyspnea scale to guide intensity level when exercising independently       Knowledge and understanding of Target Heart Rate Range (THRR) Yes       Intervention Provide education and explanation of THRR including how the numbers were predicted and where they are located for reference       Expected Outcomes Long Term: Able to use THRR to govern intensity when exercising independently;Short Term: Able to state/look up THRR;Short Term: Able to use daily as guideline for intensity in rehab       Understanding of Exercise Prescription Yes       Intervention Provide education, explanation, and written materials on patient's individual exercise prescription       Expected Outcomes Short Term: Able to explain program exercise prescription;Long Term: Able to explain home exercise prescription to exercise independently                Exercise Goals  Re-Evaluation :  Exercise Goals Re-Evaluation     Row Name 09/28/23 1043             Exercise Goal Re-Evaluation   Exercise Goals Review Increase Physical Activity;Able to understand and use Dyspnea scale;Understanding of Exercise Prescription;Increase Strength and Stamina;Knowledge and understanding of Target Heart Rate Range (THRR);Able to understand and use rate of perceived exertion (RPE) scale       Comments Elissa is scheduled to exercise again on 2/18. Will continue to monitor and progress as able.       Expected Outcomes Through exercise at rehab and home the patient will decrease shortness of breath with daily activities and feel confident in carrying out and exercise regimn at home.                Discharge Exercise Prescription (Final Exercise Prescription Changes):   Nutrition:  Target Goals: Understanding of nutrition guidelines, daily intake of sodium 1500mg , cholesterol 200mg , calories 30% from fat and 7% or less from saturated fats, daily to have 5 or more servings of fruits and vegetables.  Biometrics:  Pre Biometrics - 09/28/23 1215       Pre Biometrics   Grip Strength 21 kg              Nutrition Therapy Plan and Nutrition Goals:   Nutrition Assessments:  MEDIFICTS Score Key: >=70 Need to make dietary changes  40-70 Heart Healthy Diet <= 40 Therapeutic  Level Cholesterol Diet  Flowsheet Row PULMONARY REHAB OTHER RESPIRATORY from 10/14/2020 in [facility] for Heart, Vascular, & Lung Health  Picture Your Plate Total Score on Admission 55      Picture Your Plate Scores: <16 Unhealthy dietary pattern with much room for improvement. 41-50 Dietary pattern unlikely to meet recommendations for good health and room for improvement. 51-60 More healthful dietary pattern, with some room for improvement.  >60 Healthy dietary pattern, although there may be some specific behaviors that could be improved.    Nutrition Goals  Re-Evaluation:   Nutrition Goals Discharge (Final Nutrition Goals Re-Evaluation):   Psychosocial: Target Goals: Acknowledge presence or absence of significant depression and/or stress, maximize coping skills, provide positive support system. Participant is able to verbalize types and ability to use techniques and skills needed for reducing stress and depression.  Initial Review & Psychosocial Screening:  Initial Psych Review & Screening - 09/28/23 1045       Initial Review   Current issues with None Identified    Comments Pt denies any psychosocial barriers at this time.      Family Dynamics   Good Support System? Yes    Comments Pt states she is in contact with children      Screening Interventions   Interventions Encouraged to exercise             Quality of Life Scores:  Scores of 19 and below usually indicate a poorer quality of life in these areas.  A difference of  2-3 points is a clinically meaningful difference.  A difference of 2-3 points in the total score of the Quality of Life Index has been associated with significant improvement in overall quality of life, self-image, physical symptoms, and general health in studies assessing change in quality of life.  PHQ-9: Review Flowsheet  More data exists      09/28/2023 08/11/2022 11/23/2020 10/01/2020 11/02/2017  Depression screen PHQ 2/9  Decreased Interest 0 0 1 0 0  Down, Depressed, Hopeless 1 0 0 2 0  PHQ - 2 Score 1 0 1 2 0  Altered sleeping 1 0 0 2 -  Tired, decreased energy 1 3 1 3  -  Change in appetite 0 0 0 0 -  Feeling bad or failure about yourself  0 0 0 0 -  Trouble concentrating 0 0 0 0 -  Moving slowly or fidgety/restless 0 0 0 0 -  Suicidal thoughts 0 0 0 0 -  PHQ-9 Score 3 3 2 7  -  Difficult doing work/chores Not difficult at all Not difficult at all Somewhat difficult Somewhat difficult -   Interpretation of Total Score  Total Score Depression Severity:  1-4 = Minimal depression, 5-9 = Mild  depression, 10-14 = Moderate depression, 15-19 = Moderately severe depression, 20-27 = Severe depression   Psychosocial Evaluation and Intervention:  Psychosocial Evaluation - 09/28/23 1047       Psychosocial Evaluation & Interventions   Interventions Encouraged to exercise with the program and follow exercise prescription    Comments Pt denies any psychosocial barriers at this time.    Expected Outcomes For Fumi to participate in rehab free of psychosocial barriers.    Continue Psychosocial Services  No Follow up required             Psychosocial Re-Evaluation:  Psychosocial Re-Evaluation     Row Name 10/03/23 1020             Psychosocial Re-Evaluation  Comments Carlyn was scheduled to start the program on 10/02/23, but did not show up. No new barriers or concerns since orientation on 09/28/23.       Expected Outcomes For Zhara to participate in PR free of any psychosocial barriers or concerns.       Continue Psychosocial Services  No Follow up required                Psychosocial Discharge (Final Psychosocial Re-Evaluation):  Psychosocial Re-Evaluation - 10/03/23 1020       Psychosocial Re-Evaluation   Comments Ashara was scheduled to start the program on 10/02/23, but did not show up. No new barriers or concerns since orientation on 09/28/23.    Expected Outcomes For Tanaka to participate in PR free of any psychosocial barriers or concerns.    Continue Psychosocial Services  No Follow up required             Education: Education Goals: Education classes will be provided on a weekly basis, covering required topics. Participant will state understanding/return demonstration of topics presented.  Learning Barriers/Preferences:   Education Topics: Know Your Numbers Group instruction that is supported by a PowerPoint presentation. Instructor discusses importance of knowing and understanding resting, exercise, and post-exercise oxygen saturation,  heart rate, and blood pressure. Oxygen saturation, heart rate, blood pressure, rating of perceived exertion, and dyspnea are reviewed along with a normal range for these values.    Exercise for the Pulmonary Patient Group instruction that is supported by a PowerPoint presentation. Instructor discusses benefits of exercise, core components of exercise, frequency, duration, and intensity of an exercise routine, importance of utilizing pulse oximetry during exercise, safety while exercising, and options of places to exercise outside of rehab.    MET Level  Group instruction provided by PowerPoint, verbal discussion, and written material to support subject matter. Instructor reviews what METs are and how to increase METs.    Pulmonary Medications Verbally interactive group education provided by instructor with focus on inhaled medications and proper administration.   Anatomy and Physiology of the Respiratory System Group instruction provided by PowerPoint, verbal discussion, and written material to support subject matter. Instructor reviews respiratory cycle and anatomical components of the respiratory system and their functions. Instructor also reviews differences in obstructive and restrictive respiratory diseases with examples of each.    Oxygen Safety Group instruction provided by PowerPoint, verbal discussion, and written material to support subject matter. There is an overview of "What is Oxygen" and "Why do we need it".  Instructor also reviews how to create a safe environment for oxygen use, the importance of using oxygen as prescribed, and the risks of noncompliance. There is a brief discussion on traveling with oxygen and resources the patient may utilize. Flowsheet Row PULMONARY REHAB OTHER RESPIRATORY from 11/11/2020 in [facility] for Heart, Vascular, & Lung Health  Date 10/14/20  Educator Handout       Oxygen Use Group instruction provided by PowerPoint,  verbal discussion, and written material to discuss how supplemental oxygen is prescribed and different types of oxygen supply systems. Resources for more information are provided.    Breathing Techniques Group instruction that is supported by demonstration and informational handouts. Instructor discusses the benefits of pursed lip and diaphragmatic breathing and detailed demonstration on how to perform both.     Risk Factor Reduction Group instruction that is supported by a PowerPoint presentation. Instructor discusses the definition of a risk factor, different risk factors for pulmonary disease, and how  the heart and lungs work together.   Pulmonary Diseases Group instruction provided by PowerPoint, verbal discussion, and written material to support subject matter. Instructor gives an overview of the different type of pulmonary diseases. There is also a discussion on risk factors and symptoms as well as ways to manage the diseases.   Stress and Energy Conservation Group instruction provided by PowerPoint, verbal discussion, and written material to support subject matter. Instructor gives an overview of stress and the impact it can have on the body. Instructor also reviews ways to reduce stress. There is also a discussion on energy conservation and ways to conserve energy throughout the day.   Warning Signs and Symptoms Group instruction provided by PowerPoint, verbal discussion, and written material to support subject matter. Instructor reviews warning signs and symptoms of stroke, heart attack, cold and flu. Instructor also reviews ways to prevent the spread of infection.   Other Education Group or individual verbal, written, or video instructions that support the educational goals of the pulmonary rehab program. Flowsheet Row PULMONARY REHAB OTHER RESPIRATORY from 11/11/2020 in [facility] for Heart, Vascular, & Lung Health  Date 11/11/20  [facility] Plate]  Educator  handout  [How to beat a sedentary lifestyle]  Instruction Review Code 1- Verbalizes Understanding        Knowledge Questionnaire Score:  Knowledge Questionnaire Score - 09/28/23 1118       Knowledge Questionnaire Score   Pre Score 13/18             Core Components/Risk Factors/Patient Goals at Admission:  Personal Goals and Risk Factors at Admission - 09/28/23 1045       Core Components/Risk Factors/Patient Goals on Admission   Improve shortness of breath with ADL's Yes    Intervention Provide education, individualized exercise plan and daily activity instruction to help decrease symptoms of SOB with activities of daily living.    Expected Outcomes Short Term: Improve cardiorespiratory fitness to achieve a reduction of symptoms when performing ADLs;Long Term: Be able to perform more ADLs without symptoms or delay the onset of symptoms             Core Components/Risk Factors/Patient Goals Review:   Goals and Risk Factor Review     Row Name 10/03/23 1023             Core Components/Risk Factors/Patient Goals Review   Personal Goals Review Improve shortness of breath with ADL's;Develop more efficient breathing techniques such as purse lipped breathing and diaphragmatic breathing and practicing self-pacing with activity.       Review Unable to assess Beatrices goals at this time. She was scheduled to start on 10/02/23, but was unable to attend.       Expected Outcomes To improve shortness of breath with ADL's and develop more efficient breathing techniques such as purse lipped breathing and diaphragmatic breathing; and practicing self-pacing with activity                Core Components/Risk Factors/Patient Goals at Discharge (Final Review):   Goals and Risk Factor Review - 10/03/23 1023       Core Components/Risk Factors/Patient Goals Review   Personal Goals Review Improve shortness of breath with ADL's;Develop more efficient breathing techniques such as purse  lipped breathing and diaphragmatic breathing and practicing self-pacing with activity.    Review Unable to assess Beatrices goals at this time. She was scheduled to start on 10/02/23, but was unable to attend.    Expected Outcomes To  improve shortness of breath with ADL's and develop more efficient breathing techniques such as purse lipped breathing and diaphragmatic breathing; and practicing self-pacing with activity             ITP Comments: Pt is making expected progress toward Pulmonary Rehab goals after completing 0 session(s). Recommend continued exercise, life style modification, education, and utilization of breathing techniques to increase stamina and strength, while also decreasing shortness of breath with exertion.  Dr. Mechele Collin is Medical Director for Pulmonary Rehab at [facility].

## 2023-10-04 ENCOUNTER — Encounter (HOSPITAL_COMMUNITY)
Admission: RE | Admit: 2023-10-04 | Discharge: 2023-10-04 | Disposition: A | Discharge status: Home / Self Care | Payer: 59 | Source: Ambulatory Visit | Attending: Pulmonary Disease | Admitting: Pulmonary Disease

## 2023-10-04 DIAGNOSIS — J984 Other disorders of lung: Secondary | ICD-10-CM

## 2023-10-04 DIAGNOSIS — J849 Interstitial pulmonary disease, unspecified: Secondary | ICD-10-CM

## 2023-10-04 LAB — GLUCOSE, CAPILLARY: Glucose-Capillary: 90 mg/dL (ref 70–99)

## 2023-10-04 NOTE — Progress Notes (Deleted)
Daily Session Note  Patient Details  Name: MARKERIA GOETSCH MRN: [id number] Date of Birth: [birth year] Referring Provider:   Doristine Devoid Pulmonary Rehab Walk Test from 09/28/2023 in [facility] for Heart, Vascular, & Lung Health  Referring Provider [name]       Encounter Date: 10/04/2023  Check In:  Session Check In - 10/04/23 1325       Check-In   Supervising physician immediately available to respond to emergencies [facility] MD immediately available    Physician(s) Lorin Picket, NP    Location [facility]    Staff Present Raford Pitcher, MS, ACSM-CEP, Exercise Physiologist;Mary Gerre Scull, RN, Cathlean Cower, MS, Exercise Physiologist;Jennfier Abdulla Dionisio Paschal, ACSM-CEP, Exercise Physiologist;Casey Katrinka Blazing, RT    Virtual Visit No    Medication changes reported     No    Fall or balance concerns reported    No    Tobacco Cessation No Change    Warm-up and Cool-down Not performed (comment)    Resistance Training Performed No    VAD Patient? No    PAD/SET Patient? No      Pain Assessment   Currently in Pain? No/denies    Multiple Pain Sites No             Capillary Blood Glucose: Results for orders placed or performed during the hospital encounter of 09/28/23 (from the past 24 hours)  Glucose, capillary     Status: None   Collection Time: 10/04/23  2:15 PM  Result Value Ref Range   Glucose-Capillary 90 70 - 99 mg/dL      Social History   Tobacco Use  Smoking Status Former   Current packs/day: 0.00   Types: Cigarettes   Start date: 05/26/2000   Quit date: 05/26/2020   Years since quitting: 3.3   Passive exposure: Past  Smokeless Tobacco Never    Goals Met:  Exercise tolerated well No report of concerns or symptoms today Strength training completed today  Goals Unmet:  Not Applicable  Comments: Service time is from 1316 to 1435.    Dr. Mechele Collin is Medical Director for Pulmonary Rehab at [facility].

## 2023-10-04 NOTE — Progress Notes (Signed)
 Daily Session Note  Patient Details  Name: Tiffany Coleman MRN: [id number] Date of Birth: 19-Sep-1955 Referring Provider:   Doristine Devoid Pulmonary Rehab Walk Test from 09/28/2023 in [facility] for Heart, Vascular, & Lung Health  Referring Provider [name]       Encounter Date: 10/04/2023  Check In:  Session Check In - 10/04/23 1325       Check-In   Supervising physician immediately available to respond to emergencies [facility] MD immediately available    Physician(s) Lorin Picket, NP    [facility]    Staff Present Raford Pitcher, MS, ACSM-CEP, Exercise Physiologist;Grason Brailsford Gerre Scull, RN, Cathlean Cower, MS, Exercise Physiologist;Randi Dionisio Paschal, ACSM-CEP, Exercise Physiologist;Casey Katrinka Blazing, RT    Virtual Visit No    Medication changes reported     No    Fall or balance concerns reported    No    Tobacco Cessation No Change    Warm-up and Cool-down Not performed (comment)    Resistance Training Performed No    VAD Patient? No    PAD/SET Patient? No      Pain Assessment   Currently in Pain? No/denies    Multiple Pain Sites No             Capillary Blood Glucose: No results found for this or any previous visit (from the past 24 hours).    Social History   Tobacco Use  Smoking Status Former   Current packs/day: 0.00   Types: Cigarettes   Start date: 05/26/2000   Quit date: 05/26/2020   Years since quitting: 3.3   Passive exposure: Past  Smokeless Tobacco Never    Goals Met:  Queuing for purse lip breathing  Goals Unmet:  Pt got lightheaded and dizzy while exercising on her second station. Pt had coughing attack with lot of clear thick sputum. Pt states she has coughing attacks at home while working out if she pushes herself. BS check was 90, BP 140/66, HR 95, O2 99%.  Pt revealed she hasn't eaten all day, only had a cup of coffee this AM. Educated pt about needing to eat before exercising or she will not be allowed  to exercise. Pt verbalizes understanding. Pt provided orange juice and graham crackers. Pt feeling better after rest and snack. Pt continued with relaxation at the end of the session.   Comments: Service time is from 1314 to 1431    Dr. Mechele Collin is Medical Director for Pulmonary Rehab at [facility].

## 2023-10-05 NOTE — Progress Notes (Signed)
Incomplete Session Note  Patient Details  Name: Tiffany Coleman MRN: [id number] Date of Birth: Jun 22, 1956 Referring Provider:   Doristine Devoid Pulmonary Rehab Walk Test from 09/28/2023 in [facility] for Heart, Vascular, & Lung Health  Referring Provider [name]       HENRETTA QUIST did not complete her rehab session.   Pt got lightheaded and dizzy while exercising on her second station. Pt had coughing attack, produced copious amount of clear thick sputum. Pt states she has coughing attacks at home while working out if she pushes herself. BS check was 90, BP 140/66, HR 95, O2 99%. Pt revealed she hasn't eaten all day, only had a cup of coffee this AM. Educated pt about needing to eat before exercising or she will not be allowed to exercise. Pt verbalizes understanding. Pt provided orange juice and graham crackers. Pt feeling better after rest and snack. Pt continued with relaxation at the end of the session. Exit VS 126/68, HR 65, 100% O2 on 2L.

## 2023-10-09 ENCOUNTER — Telehealth (HOSPITAL_COMMUNITY): Payer: Self-pay | Admitting: *Deleted

## 2023-10-09 ENCOUNTER — Telehealth (HOSPITAL_COMMUNITY): Payer: Self-pay

## 2023-10-09 ENCOUNTER — Encounter (HOSPITAL_COMMUNITY): Payer: 59

## 2023-10-09 NOTE — Telephone Encounter (Signed)
 Patient left message on department VM this morning. She will be unable to attend Pulmonary Rehab today due to an unforseen family emergency. She would like to make up her session tomorrow, and requested a call back. Message forwarded to PR staff for follow-up.

## 2023-10-09 NOTE — Telephone Encounter (Signed)
 Returned patient's call about missing Pulm Rehab today. Pt stated she needs to go to court today and can't make her exercise session. Will cancel appointment

## 2023-10-11 ENCOUNTER — Encounter (HOSPITAL_COMMUNITY)
Admission: RE | Admit: 2023-10-11 | Discharge: 2023-10-11 | Disposition: A | Discharge status: Home / Self Care | Payer: 59 | Source: Ambulatory Visit | Attending: Pulmonary Disease | Admitting: Pulmonary Disease

## 2023-10-11 DIAGNOSIS — J984 Other disorders of lung: Secondary | ICD-10-CM

## 2023-10-11 DIAGNOSIS — J849 Interstitial pulmonary disease, unspecified: Secondary | ICD-10-CM

## 2023-10-11 NOTE — Progress Notes (Signed)
 Daily Session Note  Patient Details  Name: Tiffany Coleman MRN: [id number] Date of Birth: 02-Jul-1956 Referring Provider:   Doristine Devoid Pulmonary Rehab Walk Test from 09/28/2023 in [facility] for Heart, Vascular, & Lung Health  Referring Provider [name]       Encounter Date: 10/11/2023  Check In:  Session Check In - 10/11/23 1405       Check-In   Supervising physician immediately available to respond to emergencies [facility] immediately available    Physician(s) Bernadene Person, NP    [facility]    Staff Present Raford Pitcher, MS, ACSM-CEP, Exercise Physiologist;Annalysa Mohammad Gerre Scull, RN, BSN;Randi Idelle Crouch BS, ACSM-CEP, Exercise Physiologist;Casey Katrinka Blazing, RT    Virtual Visit No    Medication changes reported     No    Fall or balance concerns reported    No    Tobacco Cessation No Change    Warm-up and Cool-down Performed as group-led instruction    Resistance Training Performed Yes    VAD Patient? No    PAD/SET Patient? No      Pain Assessment   Currently in Pain? No/denies    Multiple Pain Sites No             Capillary Blood Glucose: No results found for this or any previous visit (from the past 24 hours).    Social History   Tobacco Use  Smoking Status Former   Current packs/day: 0.00   Types: Cigarettes   Start date: 05/26/2000   Quit date: 05/26/2020   Years since quitting: 3.3   Passive exposure: Past  Smokeless Tobacco Never    Goals Met:  Exercise tolerated well Queuing for purse lip breathing No report of concerns or symptoms today Strength training completed today  Goals Unmet:  Not Applicable  Comments: Service time is from 1325 to 1455    Dr. Mechele Collin is Medical Director for Pulmonary Rehab at [facility].

## 2023-10-12 LAB — GLUCOSE, CAPILLARY
Glucose-Capillary: 100 mg/dL — ABNORMAL HIGH (ref 70–99)
Glucose-Capillary: 101 mg/dL — ABNORMAL HIGH (ref 70–99)

## 2023-10-15 ENCOUNTER — Other Ambulatory Visit (HOSPITAL_BASED_OUTPATIENT_CLINIC_OR_DEPARTMENT_OTHER): Payer: 59

## 2023-10-15 ENCOUNTER — Inpatient Hospital Stay (HOSPITAL_BASED_OUTPATIENT_CLINIC_OR_DEPARTMENT_OTHER): Admission: RE | Admit: 2023-10-15 | Payer: 59 | Source: Ambulatory Visit | Admitting: Radiology

## 2023-10-16 ENCOUNTER — Encounter (HOSPITAL_COMMUNITY)
Admission: RE | Admit: 2023-10-16 | Discharge: 2023-10-16 | Disposition: A | Discharge status: Home / Self Care | Payer: 59 | Source: Ambulatory Visit | Attending: Pulmonary Disease | Admitting: Pulmonary Disease

## 2023-10-16 VITALS — Wt 174.2 lb

## 2023-10-16 DIAGNOSIS — J984 Other disorders of lung: Secondary | ICD-10-CM | POA: Insufficient documentation

## 2023-10-16 DIAGNOSIS — J849 Interstitial pulmonary disease, unspecified: Secondary | ICD-10-CM | POA: Diagnosis present

## 2023-10-16 LAB — GLUCOSE, CAPILLARY
Glucose-Capillary: 101 mg/dL — ABNORMAL HIGH (ref 70–99)
Glucose-Capillary: 90 mg/dL (ref 70–99)

## 2023-10-16 NOTE — Progress Notes (Signed)
 Daily Session Note  Patient Details  Name: Tiffany Coleman MRN: [id number] Date of Birth: 07-22-1956 Referring Provider:   Doristine Devoid Pulmonary Rehab Walk Test from 09/28/2023 in [facility] for Heart, Vascular, & Lung Health  Referring Provider [name]       Encounter Date: 10/16/2023  Check In:  Session Check In - 10/16/23 1328       Check-In   Supervising physician immediately available to respond to emergencies [facility] immediately available    Physician(s) Bernadene Person, NP    [facility]    Staff Present Raford Pitcher, MS, ACSM-CEP, Exercise Physiologist;Mary Gerre Scull, RN, BSN;Randi Idelle Crouch BS, ACSM-CEP, Exercise Physiologist;Casey Katrinka Blazing, RT    Virtual Visit No    Medication changes reported     No    Fall or balance concerns reported    No    Tobacco Cessation No Change    Warm-up and Cool-down Performed as group-led instruction    Resistance Training Performed Yes    VAD Patient? No    PAD/SET Patient? No      Pain Assessment   Currently in Pain? No/denies    Multiple Pain Sites No             Capillary Blood Glucose: Results for orders placed or performed during the hospital encounter of 10/11/23 (from the past 24 hours)  Glucose, capillary     Status: Abnormal   Collection Time: 10/16/23  1:06 PM  Result Value Ref Range   Glucose-Capillary 101 (H) 70 - 99 mg/dL  Glucose, capillary     Status: None   Collection Time: 10/16/23  2:41 PM  Result Value Ref Range   Glucose-Capillary 90 70 - 99 mg/dL     Exercise Prescription Changes - 10/16/23 1500       Response to Exercise   Blood Pressure (Admit) 114/70    Blood Pressure (Exercise) 152/80    Blood Pressure (Exit) 120/60    Heart Rate (Admit) 65 bpm    Heart Rate (Exercise) 91 bpm    Heart Rate (Exit) 65 bpm    Oxygen Saturation (Admit) 100 %    Oxygen Saturation (Exercise) 97 %    Oxygen Saturation (Exit) 100 %    Rating of Perceived Exertion  (Exercise) 15    Perceived Dyspnea (Exercise) 1    Duration Continue with 30 min of aerobic exercise without signs/symptoms of physical distress.    Intensity THRR unchanged      Progression   Progression Continue to progress workloads to maintain intensity without signs/symptoms of physical distress.      Resistance Training   Training Prescription Yes    Weight red bands    Reps 10-15    Time 10 Minutes      Oxygen   Oxygen Continuous    Liters 2      Recumbant Bike   Level 1    Minutes 15    METs 2.1      Recumbant Elliptical   Level 1    Minutes 15    METs 2.9      Oxygen   Maintain Oxygen Saturation 88% or higher             Social History   Tobacco Use  Smoking Status Former   Current packs/day: 0.00   Types: Cigarettes   Start date: 05/26/2000   Quit date: 05/26/2020   Years since quitting: 3.3   Passive exposure: Past  Smokeless  Tobacco Never    Goals Met:  Proper associated with RPD/PD & O2 Sat Exercise tolerated well No report of concerns or symptoms today Strength training completed today  Goals Unmet:  Not Applicable  Comments: Service time is from 1302 to 1438.    Dr. Mechele Collin is Medical Director for Pulmonary Rehab at [facility].

## 2023-10-18 ENCOUNTER — Encounter (HOSPITAL_COMMUNITY)
Admission: RE | Admit: 2023-10-18 | Discharge: 2023-10-18 | Disposition: A | Discharge status: Home / Self Care | Payer: 59 | Source: Ambulatory Visit | Attending: Pulmonary Disease | Admitting: Pulmonary Disease

## 2023-10-18 DIAGNOSIS — J849 Interstitial pulmonary disease, unspecified: Secondary | ICD-10-CM

## 2023-10-18 DIAGNOSIS — J984 Other disorders of lung: Secondary | ICD-10-CM

## 2023-10-18 NOTE — Progress Notes (Signed)
 Daily Session Note  Patient Details  Name: Tiffany Coleman MRN: [id number] Date of Birth: 1956-03-13 Referring Provider:   Doristine Devoid Pulmonary Rehab Walk Test from 09/28/2023 in [facility] for Heart, Vascular, & Lung Health  Referring Provider [name]       Encounter Date: 10/18/2023  Check In:  Session Check In - 10/18/23 1342       Check-In   Supervising physician immediately available to respond to emergencies [facility] immediately available    Physician(s) Robin Searing, NP    [facility]    Staff Present Raford Pitcher, MS, ACSM-CEP, Exercise Physiologist;Mary Gerre Scull, RN, BSN;Randi Idelle Crouch BS, ACSM-CEP, Exercise Physiologist;Krishawn Vanderweele Katrinka Blazing, RT    Virtual Visit No    Medication changes reported     No    Fall or balance concerns reported    No    Tobacco Cessation No Change    Warm-up and Cool-down Performed as group-led instruction    Resistance Training Performed Yes    VAD Patient? No    PAD/SET Patient? No      Pain Assessment   Currently in Pain? No/denies    Multiple Pain Sites No             Capillary Blood Glucose: No results found for this or any previous visit (from the past 24 hours).    Social History   Tobacco Use  Smoking Status Former   Current packs/day: 0.00   Types: Cigarettes   Start date: 05/26/2000   Quit date: 05/26/2020   Years since quitting: 3.3   Passive exposure: Past  Smokeless Tobacco Never    Goals Met:  Proper associated with RPD/PD & O2 Sat Independence with exercise equipment Exercise tolerated well No report of concerns or symptoms today Strength training completed today  Goals Unmet:  Not Applicable  Comments: Service time is from 1305 to 1437.    Dr. Mechele Collin is Medical Director for Pulmonary Rehab at [facility].

## 2023-10-22 ENCOUNTER — Inpatient Hospital Stay (HOSPITAL_BASED_OUTPATIENT_CLINIC_OR_DEPARTMENT_OTHER): Admission: RE | Admit: 2023-10-22 | Source: Ambulatory Visit | Admitting: Radiology

## 2023-10-22 ENCOUNTER — Other Ambulatory Visit (HOSPITAL_BASED_OUTPATIENT_CLINIC_OR_DEPARTMENT_OTHER)

## 2023-10-23 ENCOUNTER — Encounter (HOSPITAL_COMMUNITY)
Admission: RE | Admit: 2023-10-23 | Discharge: 2023-10-23 | Disposition: A | Discharge status: Home / Self Care | Payer: 59 | Source: Ambulatory Visit | Attending: Pulmonary Disease | Admitting: Pulmonary Disease

## 2023-10-23 DIAGNOSIS — J849 Interstitial pulmonary disease, unspecified: Secondary | ICD-10-CM

## 2023-10-23 DIAGNOSIS — J984 Other disorders of lung: Secondary | ICD-10-CM

## 2023-10-23 NOTE — Progress Notes (Signed)
 Daily Session Note  Patient Details  Name: Tiffany Coleman MRN: [id number] Date of Birth: 10-18-1955 Referring Provider:   Doristine Devoid Pulmonary Rehab Walk Test from 09/28/2023 in [facility] for Heart, Vascular, & Lung Health  Referring Provider [name]       Encounter Date: 10/23/2023  Check In:  Session Check In - 10/23/23 1425       Check-In   Supervising physician immediately available to respond to emergencies [facility] MD immediately available    Physician(s) Tereso Newcomer, PA    Location [facility]    Staff Present Raford Pitcher, MS, ACSM-CEP, Exercise Physiologist;Randi Dionisio Paschal, ACSM-CEP, Exercise Physiologist;Saleemah Mollenhauer Thedore Mins, RN, BSN    Virtual Visit No    Medication changes reported     No    Fall or balance concerns reported    No    Tobacco Cessation No Change    Warm-up and Cool-down Performed as group-led instruction    Resistance Training Performed Yes    VAD Patient? No    PAD/SET Patient? No      Pain Assessment   Currently in Pain? No/denies    Multiple Pain Sites No             Capillary Blood Glucose: No results found for this or any previous visit (from the past 24 hours).    Social History   Tobacco Use  Smoking Status Former   Current packs/day: 0.00   Types: Cigarettes   Start date: 05/26/2000   Quit date: 05/26/2020   Years since quitting: 3.4   Passive exposure: Past  Smokeless Tobacco Never    Goals Met:  Proper associated with RPD/PD & O2 Sat Independence with exercise equipment Exercise tolerated well No report of concerns or symptoms today Strength training completed today  Goals Unmet:  Not Applicable  Comments: Service time is from 1307 to 1437.    Dr. Mechele Collin is Medical Director for Pulmonary Rehab at [facility].

## 2023-10-25 ENCOUNTER — Encounter (HOSPITAL_COMMUNITY)
Admission: RE | Admit: 2023-10-25 | Discharge: 2023-10-25 | Disposition: A | Discharge status: Home / Self Care | Payer: 59 | Source: Ambulatory Visit | Attending: Pulmonary Disease | Admitting: Pulmonary Disease

## 2023-10-25 VITALS — Wt 174.2 lb

## 2023-10-25 DIAGNOSIS — J849 Interstitial pulmonary disease, unspecified: Secondary | ICD-10-CM

## 2023-10-25 DIAGNOSIS — J984 Other disorders of lung: Secondary | ICD-10-CM

## 2023-10-25 NOTE — Progress Notes (Signed)
 Daily Session Note  Patient Details  Name: Tiffany Coleman MRN: [id number] Date of Birth: 1956/07/24 Referring Provider:   Doristine Devoid Pulmonary Rehab Walk Test from 09/28/2023 in [facility] for Heart, Vascular, & Lung Health  Referring Provider [name]       Encounter Date: 10/25/2023  Check In:  Session Check In - 10/25/23 1414       Check-In   Supervising physician immediately available to respond to emergencies [facility] MD immediately available    Physician(s) Edd Fabian, NP    Location [facility]    Staff Present Raford Pitcher, MS, ACSM-CEP, Exercise Physiologist;Randi Dionisio Paschal, ACSM-CEP, Exercise Physiologist;Matasha Smigelski Chester Holstein, MS, Exercise Physiologist    Virtual Visit No    Medication changes reported     No    Fall or balance concerns reported    No    Tobacco Cessation No Change    Warm-up and Cool-down Performed as group-led instruction    Resistance Training Performed Yes    VAD Patient? No    PAD/SET Patient? No      Pain Assessment   Currently in Pain? No/denies    Multiple Pain Sites No             Capillary Blood Glucose: No results found for this or any previous visit (from the past 24 hours).    Social History   Tobacco Use  Smoking Status Former   Current packs/day: 0.00   Types: Cigarettes   Start date: 05/26/2000   Quit date: 05/26/2020   Years since quitting: 3.4   Passive exposure: Past  Smokeless Tobacco Never    Goals Met:  Proper associated with RPD/PD & O2 Sat Independence with exercise equipment Exercise tolerated well No report of concerns or symptoms today Strength training completed today  Goals Unmet:  Not Applicable  Comments: Service time is from 1305 to 1442.    Dr. Mechele Collin is Medical Director for Pulmonary Rehab at [facility].

## 2023-10-30 ENCOUNTER — Encounter (HOSPITAL_COMMUNITY)
Admission: RE | Admit: 2023-10-30 | Discharge: 2023-10-30 | Disposition: A | Discharge status: Home / Self Care | Payer: 59 | Source: Ambulatory Visit | Attending: Pulmonary Disease | Admitting: Pulmonary Disease

## 2023-10-30 ENCOUNTER — Telehealth (HOSPITAL_COMMUNITY): Payer: Self-pay | Admitting: *Deleted

## 2023-10-30 DIAGNOSIS — J984 Other disorders of lung: Secondary | ICD-10-CM

## 2023-10-30 DIAGNOSIS — J849 Interstitial pulmonary disease, unspecified: Secondary | ICD-10-CM

## 2023-10-30 NOTE — Telephone Encounter (Signed)
 Called pt to check in since she missed her Pulmonary Rehab appt. She sts she has a HA today and also didn't have transportation. She sts she plans to attend 3/20.  Ethelda Chick BS, ACSM-CEP 10/30/2023 3:46 PM

## 2023-10-31 NOTE — Progress Notes (Signed)
 Pulmonary Individual Treatment Plan  Patient Details  Name: JANESE RADABAUGH MRN: [id number] Date of Birth: Apr 24, 1956 Referring Provider:   Doristine Devoid Pulmonary Rehab Walk Test from 09/28/2023 in [facility]  Referring Provider [name]       Initial Encounter Date:  Flowsheet Row Pulmonary Rehab Walk Test from 09/28/2023 in [facility]  Date 09/28/23       Visit Diagnosis: ILD (interstitial lung disease) (HCC)  Pneumonitis  Patient's Home Medications on Admission:   Current Outpatient Medications:    acetaminophen (TYLENOL) 325 MG tablet, Take 2 tablets (650 mg total) by mouth every 6 (six) hours as needed for mild pain or headache (fever >/= 101)., Disp: , Rfl:    albuterol (2.5 MG/3ML) 0.083% NEBU 3 mL, albuterol (5 MG/ML) 0.5% NEBU 0.5 mL, Inhale 5 mg into the lungs once a week., Disp: , Rfl:    albuterol (VENTOLIN HFA) 108 (90 Base) MCG/ACT inhaler, Inhale 2 puffs into the lungs every 6 (six) hours as needed for wheezing or shortness of breath., Disp: 1 each, Rfl: 2   azaTHIOprine (IMURAN) 50 MG tablet, TAKE 3 TABLETS(150 MG) BY MOUTH DAILY, Disp: 90 tablet, Rfl: 5   diclofenac Sodium (VOLTAREN) 1 % GEL, SMARTSIG:4 Gram(s) Topical 4 Times Daily PRN, Disp: , Rfl:    fluticasone (FLONASE) 50 MCG/ACT nasal spray, Place 2 sprays into both nostrils daily. (Patient taking differently: Place 2 sprays into both nostrils daily as needed for rhinitis.), Disp: , Rfl: 2   furosemide (LASIX) 20 MG tablet, Take 1 tablet (20 mg total) by mouth 2 (two) times daily. NEEDS FOLLOW UP APPOINTMENT FOR MORE REFILLS, Disp: 90 tablet, Rfl: 0   hydrOXYzine (ATARAX) 25 MG tablet, Take 25 mg by mouth 2 (two) times daily as needed. for anxiety, Disp: , Rfl:    ibuprofen (ADVIL) 800 MG tablet, Take 1 tablet (800 mg total) by mouth 3 (three) times daily., Disp: 21 tablet, Rfl: 0    ipratropium-albuterol (DUONEB) 0.5-2.5 (3) MG/3ML SOLN, Take 3 mLs by nebulization every 6 (six) hours as needed., Disp: 360 mL, Rfl: 5   MOUNJARO 2.5 MG/0.5ML Pen, SMARTSIG:2.5 Milligram(s) SUB-Q Once a Week, Disp: , Rfl:    ondansetron (ZOFRAN-ODT) 4 MG disintegrating tablet, Take 1 tablet (4 mg total) by mouth every 8 (eight) hours as needed., Disp: 8 tablet, Rfl: 0   pantoprazole (PROTONIX) 40 MG tablet, Take 1 tablet (40 mg total) by mouth daily at 6 (six) AM., Disp: 30 tablet, Rfl: 2   potassium chloride (KLOR-CON M) 10 MEQ tablet, Take 1 tablet (10 mEq total) by mouth daily. Absolute last refill without office visit please call [phone] to schedule, Disp: 30 tablet, Rfl: 0   predniSONE (DELTASONE) 2.5 MG tablet, Take 2.5mg  daily x's 2 weeks then 2.5mg  every other day x's 2 weeks, Disp: 21 tablet, Rfl: 0   promethazine-dextromethorphan (PROMETHAZINE-DM) 6.25-15 MG/5ML syrup, Take 10 mLs by mouth 2 (two) times daily as needed for cough., Disp: , Rfl:    SYMBICORT 160-4.5 MCG/ACT inhaler, SMARTSIG:2 Puff(s) By Mouth Twice Daily, Disp: , Rfl:    zolpidem (AMBIEN) 10 MG tablet, Take 10 mg by mouth at bedtime as needed for sleep., Disp: , Rfl:   Past Medical History: Past Medical History:  Diagnosis Date   Anemia yrs ago   Arthritis    knees   Bronchitis    hx   CHF (congestive heart  failure) (HCC)    Complication of anesthesia    trouble breathing when wake up needs breathing tx when wakes up   Hx of colonic polyps 2015   Obstructive sleep apnea 06/02/2016   No CPAP used   Recurrent ventral hernia 09/08/2013   Umbilical hernia     Tobacco Use: Social History   Tobacco Use  Smoking Status Former   Current packs/day: 0.00   Types: Cigarettes   Start date: 05/26/2000   Quit date: 05/26/2020   Years since quitting: 3.4   Passive exposure: Past  Smokeless Tobacco Never    Labs: Review Flowsheet  More data exists      Latest Ref Rng & Units 04/05/2020 04/25/2020 05/20/2021  10/10/2021 10/11/2022  Labs for ITP Cardiac and Pulmonary Rehab  Cholestrol <200 mg/dL - - - 409  811   LDL (calc) mg/dL (calc) - - - 914  98   HDL-C > OR = 50 mg/dL - - - 69  58   Trlycerides <150 mg/dL 782  - - 83  956   Hemoglobin A1c 4.8 - 5.6 % - 6.7  - - -  Bicarbonate 20.0 - 28.0 mmol/L 26.4  - 32.6  - -  TCO2 22 - 32 mmol/L - - 34  32  33  - -  O2 Saturation % 38.5  - 74.0  - -    Details       Multiple values from one day are sorted in reverse-chronological order         Capillary Blood Glucose: Lab Results  Component Value Date   GLUCAP 90 10/16/2023   GLUCAP 101 (H) 10/16/2023   GLUCAP 101 (H) 10/11/2023   GLUCAP 100 (H) 10/11/2023   GLUCAP 90 10/04/2023     Pulmonary Assessment Scores:  Pulmonary Assessment Scores     Row Name 09/28/23 1120         ADL UCSD   ADL Phase Entry     SOB Score total 16       CAT Score   CAT Score 13       mMRC Score   mMRC Score 2             UCSD: Self-administered rating of dyspnea associated with activities of daily living (ADLs) 6-point scale (0 = "not at all" to 5 = "maximal or unable to do because of breathlessness")  Scoring Scores range from 0 to 120.  Minimally important difference is 5 units  CAT: CAT can identify the health impairment of COPD patients and is better correlated with disease progression.  CAT has a scoring range of zero to 40. The CAT score is classified into four groups of low (less than 10), medium (10 - 20), high (21-30) and very high (31-40) based on the impact level of disease on health status. A CAT score over 10 suggests significant symptoms.  A worsening CAT score could be explained by an exacerbation, poor medication adherence, poor inhaler technique, or progression of COPD or comorbid conditions.  CAT MCID is 2 points  mMRC: mMRC (Modified Medical Research Council) Dyspnea Scale is used to assess the degree of baseline functional disability in patients of respiratory disease due  to dyspnea. No minimal important difference is established. A decrease in score of 1 point or greater is considered a positive change.   Pulmonary Function Assessment:  Pulmonary Function Assessment - 09/28/23 1044       Breath   Bilateral Breath Sounds Clear  Shortness of Breath Yes;Limiting activity;Fear of Shortness of Breath             Exercise Target Goals: Exercise Program Goal: Individual exercise prescription set using results from initial 6 min walk test and THRR while considering  patient's activity barriers and safety.   Exercise Prescription Goal: Initial exercise prescription builds to 30-45 minutes a day of aerobic activity, 2-3 days per week.  Home exercise guidelines will be given to patient during program as part of exercise prescription that the participant will acknowledge.  Activity Barriers & Risk Stratification:   6 Minute Walk:  6 Minute Walk     Row Name 09/28/23 1130         6 Minute Walk   Phase Initial     Distance 1140 feet     Walk Time 6 minutes     # of Rest Breaks 0     MPH 2.16     METS 2.52     RPE 9     Perceived Dyspnea  1     VO2 Peak 8.81     Symptoms No     Resting HR 80 bpm     Resting BP 114/70     Resting Oxygen Saturation  100 %     Exercise Oxygen Saturation  during 6 min walk 92 %     Max Ex. HR 102 bpm     Max Ex. BP 140/64     2 Minute Post BP 130/70       Interval HR   1 Minute HR 89     2 Minute HR 101     3 Minute HR 98     4 Minute HR 102     5 Minute HR 100     6 Minute HR 101     2 Minute Post HR 75     Interval Heart Rate? Yes       Interval Oxygen   Interval Oxygen? Yes     Baseline Oxygen Saturation % 100 %     1 Minute Oxygen Saturation % 100 %     1 Minute Liters of Oxygen 2 L     2 Minute Oxygen Saturation % 97 %     2 Minute Liters of Oxygen 2 L     3 Minute Oxygen Saturation % 98 %     3 Minute Liters of Oxygen 2 L     4 Minute Oxygen Saturation % 94 %     4 Minute Liters of Oxygen  2 L     5 Minute Oxygen Saturation % 93 %     5 Minute Liters of Oxygen 2 L     6 Minute Oxygen Saturation % 92 %     6 Minute Liters of Oxygen 2 L     2 Minute Post Oxygen Saturation % 100 %     2 Minute Post Liters of Oxygen 2 L              Oxygen Initial Assessment:  Oxygen Initial Assessment - 09/28/23 1044       Home Oxygen   Home Oxygen Device E-Tanks;Home Concentrator    Sleep Oxygen Prescription Continuous    Liters per minute 2    Home Exercise Oxygen Prescription Pulsed    Liters per minute 2    Home Resting Oxygen Prescription Pulsed    Liters per minute 2    Compliance with Home Oxygen Use Yes  Initial 6 min Walk   Oxygen Used Continuous    Liters per minute 2      Program Oxygen Prescription   Program Oxygen Prescription Continuous    Liters per minute 2      Intervention   Short Term Goals To learn and exhibit compliance with exercise, home and travel O2 prescription;To learn and understand importance of monitoring SPO2 with pulse oximeter and demonstrate accurate use of the pulse oximeter.;To learn and understand importance of maintaining oxygen saturations>88%;To learn and demonstrate proper use of respiratory medications;To learn and demonstrate proper pursed lip breathing techniques or other breathing techniques.     Long  Term Goals Exhibits compliance with exercise, home  and travel O2 prescription;Verbalizes importance of monitoring SPO2 with pulse oximeter and return demonstration;Maintenance of O2 saturations>88%;Exhibits proper breathing techniques, such as pursed lip breathing or other method taught during program session;Compliance with respiratory medication;Demonstrates proper use of MDI's             Oxygen Re-Evaluation:  Oxygen Re-Evaluation     Row Name 10/26/23 0926             Program Oxygen Prescription   Program Oxygen Prescription Continuous       Liters per minute 2         Home Oxygen   Home Oxygen Device  E-Tanks;Home Concentrator       Sleep Oxygen Prescription Continuous       Liters per minute 2       Home Exercise Oxygen Prescription Pulsed       Liters per minute 2       Home Resting Oxygen Prescription Pulsed       Liters per minute 2       Compliance with Home Oxygen Use Yes         Goals/Expected Outcomes   Short Term Goals To learn and exhibit compliance with exercise, home and travel O2 prescription;To learn and understand importance of monitoring SPO2 with pulse oximeter and demonstrate accurate use of the pulse oximeter.;To learn and understand importance of maintaining oxygen saturations>88%;To learn and demonstrate proper use of respiratory medications;To learn and demonstrate proper pursed lip breathing techniques or other breathing techniques.        Long  Term Goals Exhibits compliance with exercise, home  and travel O2 prescription;Verbalizes importance of monitoring SPO2 with pulse oximeter and return demonstration;Maintenance of O2 saturations>88%;Exhibits proper breathing techniques, such as pursed lip breathing or other method taught during program session;Compliance with respiratory medication;Demonstrates proper use of MDI's       Goals/Expected Outcomes Compliance and understanding of oxygen saturation monitoring and breathing techniques to decrease shortness of breath.                Oxygen Discharge (Final Oxygen Re-Evaluation):  Oxygen Re-Evaluation - 10/26/23 0926       Program Oxygen Prescription   Program Oxygen Prescription Continuous    Liters per minute 2      Home Oxygen   Home Oxygen Device E-Tanks;Home Concentrator    Sleep Oxygen Prescription Continuous    Liters per minute 2    Home Exercise Oxygen Prescription Pulsed    Liters per minute 2    Home Resting Oxygen Prescription Pulsed    Liters per minute 2    Compliance with Home Oxygen Use Yes      Goals/Expected Outcomes   Short Term Goals To learn and exhibit compliance with exercise,  home and travel O2 prescription;To learn  and understand importance of monitoring SPO2 with pulse oximeter and demonstrate accurate use of the pulse oximeter.;To learn and understand importance of maintaining oxygen saturations>88%;To learn and demonstrate proper use of respiratory medications;To learn and demonstrate proper pursed lip breathing techniques or other breathing techniques.     Long  Term Goals Exhibits compliance with exercise, home  and travel O2 prescription;Verbalizes importance of monitoring SPO2 with pulse oximeter and return demonstration;Maintenance of O2 saturations>88%;Exhibits proper breathing techniques, such as pursed lip breathing or other method taught during program session;Compliance with respiratory medication;Demonstrates proper use of MDI's    Goals/Expected Outcomes Compliance and understanding of oxygen saturation monitoring and breathing techniques to decrease shortness of breath.             Initial Exercise Prescription:  Initial Exercise Prescription - 09/28/23 1200       Date of Initial Exercise RX and Referring Provider   Date 09/28/23    Referring Provider [name]    Expected Discharge Date 12/20/23      Oxygen   Oxygen Continuous    Liters 2    Maintain Oxygen Saturation 88% or higher      Recumbant Bike   Level 2    RPM 70    Minutes 15    METs 2      Recumbant Elliptical   Level 2    RPM 70    Minutes 15    METs 2      Prescription Details   Frequency (times per week) 2    Duration Progress to 30 minutes of continuous aerobic without signs/symptoms of physical distress      Intensity   THRR 40-80% of Max Heartrate 60-121    Ratings of Perceived Exertion 11-13    Perceived Dyspnea 0-4      Progression   Progression Continue to progress workloads to maintain intensity without signs/symptoms of physical distress.      Resistance Training   Training Prescription Yes    Weight red bands    Reps 10-15             Perform  Capillary Blood Glucose checks as needed.  Exercise Prescription Changes:   Exercise Prescription Changes     Row Name 10/16/23 1500 10/25/23 1426           Response to Exercise   Blood Pressure (Admit) 114/70 124/58      Blood Pressure (Exercise) 152/80 --      Blood Pressure (Exit) 120/60 100/60      Heart Rate (Admit) 65 bpm 74 bpm      Heart Rate (Exercise) 91 bpm 120 bpm      Heart Rate (Exit) 65 bpm 72 bpm      Oxygen Saturation (Admit) 100 % 100 %  2l      Oxygen Saturation (Exercise) 97 % 94 %  2L      Oxygen Saturation (Exit) 100 % 100 %  2      Rating of Perceived Exertion (Exercise) 15 15      Perceived Dyspnea (Exercise) 1 1      Duration Continue with 30 min of aerobic exercise without signs/symptoms of physical distress. Continue with 30 min of aerobic exercise without signs/symptoms of physical distress.      Intensity THRR unchanged THRR unchanged        Progression   Progression Continue to progress workloads to maintain intensity without signs/symptoms of physical distress. Continue to progress workloads to maintain intensity without signs/symptoms  of physical distress.        Resistance Training   Training Prescription Yes Yes      Weight red bands red bands      Reps 10-15 10-15      Time 10 Minutes 10 Minutes        Oxygen   Oxygen Continuous Continuous      Liters 2 2        Recumbant Bike   Level 1 --      Minutes 15 --      METs 2.1 --        Recumbant Elliptical   Level 1 2      Minutes 15 15      METs 2.9 3        Track   Laps -- 10      Minutes -- 15      METs -- 2.54        Oxygen   Maintain Oxygen Saturation 88% or higher 88% or higher               Exercise Comments:   Exercise Comments     Row Name 10/04/23 1459           Exercise Comments Pt completed her first day of group exercise. She exercised on the recumbent bike for 15 min, level 1, METs 1.8. She then exercised on the recumbent elliptical for 15 min, level  1, METs 3.8. She tolerated well but felt shaky afterwards. She did not complete cool down. Discussed METs with good reception.                Exercise Goals and Review:   Exercise Goals     Row Name 09/28/23 1042             Exercise Goals   Increase Physical Activity Yes       Intervention Provide advice, education, support and counseling about physical activity/exercise needs.;Develop an individualized exercise prescription for aerobic and resistive training based on initial evaluation findings, risk stratification, comorbidities and participant's personal goals.       Expected Outcomes Short Term: Attend rehab on a regular basis to increase amount of physical activity.;Long Term: Add in home exercise to make exercise part of routine and to increase amount of physical activity.;Long Term: Exercising regularly at least 3-5 days a week.       Increase Strength and Stamina Yes       Intervention Provide advice, education, support and counseling about physical activity/exercise needs.;Develop an individualized exercise prescription for aerobic and resistive training based on initial evaluation findings, risk stratification, comorbidities and participant's personal goals.       Expected Outcomes Short Term: Increase workloads from initial exercise prescription for resistance, speed, and METs.;Short Term: Perform resistance training exercises routinely during rehab and add in resistance training at home;Long Term: Improve cardiorespiratory fitness, muscular endurance and strength as measured by increased METs and functional capacity ( )       Able to understand and use rate of perceived exertion (RPE) scale Yes       Intervention Provide education and explanation on how to use RPE scale       Expected Outcomes Short Term: Able to use RPE daily in rehab to express subjective intensity level;Long Term:  Able to use RPE to guide intensity level when exercising independently       Able to  understand and use Dyspnea scale Yes  Intervention Provide education and explanation on how to use Dyspnea scale       Expected Outcomes Short Term: Able to use Dyspnea scale daily in rehab to express subjective sense of shortness of breath during exertion;Long Term: Able to use Dyspnea scale to guide intensity level when exercising independently       Knowledge and understanding of Target Heart Rate Range (THRR) Yes       Intervention Provide education and explanation of THRR including how the numbers were predicted and where they are located for reference       Expected Outcomes Long Term: Able to use THRR to govern intensity when exercising independently;Short Term: Able to state/look up THRR;Short Term: Able to use daily as guideline for intensity in rehab       Understanding of Exercise Prescription Yes       Intervention Provide education, explanation, and written materials on patient's individual exercise prescription       Expected Outcomes Short Term: Able to explain program exercise prescription;Long Term: Able to explain home exercise prescription to exercise independently                Exercise Goals Re-Evaluation :  Exercise Goals Re-Evaluation     Row Name 09/28/23 1043 10/26/23 0922           Exercise Goal Re-Evaluation   Exercise Goals Review Increase Physical Activity;Able to understand and use Dyspnea scale;Understanding of Exercise Prescription;Increase Strength and Stamina;Knowledge and understanding of Target Heart Rate Range (THRR);Able to understand and use rate of perceived exertion (RPE) scale Increase Physical Activity;Able to understand and use Dyspnea scale;Understanding of Exercise Prescription;Increase Strength and Stamina;Knowledge and understanding of Target Heart Rate Range (THRR);Able to understand and use rate of perceived exertion (RPE) scale      Comments Tandy is scheduled to exercise again on 2/18. Will continue to monitor and progress as  able. Odell has completed 6 exercise sessions, missing one session. She was exercising on the recumbent bike for 15 min, level 2, METs 2.3. However her knee was hurting therefore changed to walking the track last session with decreased pain and increased enjoyment. She walked 15 min, METs 2.54. She then is exercising on the recumbent elliptical for 15 min, level 2, METs 3.0. She is beginning to progress and is becoming more motivated. She performs warmup and cool down standing, working on squats. Using red bands, 3.7 lbs. Will encouraged blue bands, 5.8 lbs. Will continue to monitor and progress as able.      Expected Outcomes Through exercise at rehab and home the patient will decrease shortness of breath with daily activities and feel confident in carrying out and exercise regimn at home. Through exercise at rehab and home the patient will decrease shortness of breath with daily activities and feel confident in carrying out and exercise regimn at home.               Discharge Exercise Prescription (Final Exercise Prescription Changes):  Exercise Prescription Changes - 10/25/23 1426       Response to Exercise   Blood Pressure (Admit) 124/58    Blood Pressure (Exit) 100/60    Heart Rate (Admit) 74 bpm    Heart Rate (Exercise) 120 bpm    Heart Rate (Exit) 72 bpm    Oxygen Saturation (Admit) 100 %   2l   Oxygen Saturation (Exercise) 94 %   2L   Oxygen Saturation (Exit) 100 %   2   Rating of Perceived Exertion (  Exercise) 15    Perceived Dyspnea (Exercise) 1    Duration Continue with 30 min of aerobic exercise without signs/symptoms of physical distress.    Intensity THRR unchanged      Progression   Progression Continue to progress workloads to maintain intensity without signs/symptoms of physical distress.      Resistance Training   Training Prescription Yes    Weight red bands    Reps 10-15    Time 10 Minutes      Oxygen   Oxygen Continuous    Liters 2      Recumbant  Elliptical   Level 2    Minutes 15    METs 3      Track   Laps 10    Minutes 15    METs 2.54      Oxygen   Maintain Oxygen Saturation 88% or higher             Nutrition:  Target Goals: Understanding of nutrition guidelines, daily intake of sodium 1500mg , cholesterol 200mg , calories 30% from fat and 7% or less from saturated fats, daily to have 5 or more servings of fruits and vegetables.  Biometrics:  Pre Biometrics - 09/28/23 1215       Pre Biometrics   Grip Strength 21 kg              Nutrition Therapy Plan and Nutrition Goals:  Nutrition Therapy & Goals - 10/26/23 1040       Nutrition Therapy   Diet Heart Healthy Diet      Personal Nutrition Goals   Nutrition Goal Patient to improve diet quality by using the plate method as a guide for meal planning to include lean protein/plant protein, fruits, vegetables, whole grains, nonfat dairy as part of a well-balanced diet.    Comments Goal in progress. Brunella has medical hisotry of pneumonitis, ILD, HTN, OSA. She has maintained her weight since starting with our program. Answered patient questions regarding sugar intake and strategies for reducing added sugar. Patient will continue to benefit from participation in pulmonary rehab for nutrition, exercise, and lifestyle modification support.      Intervention Plan   Intervention Prescribe, educate and counsel regarding individualized specific dietary modifications aiming towards targeted core components such as weight, hypertension, lipid management, diabetes, heart failure and other comorbidities.;Nutrition handout(s) given to patient.    Expected Outcomes Short Term Goal: Understand basic principles of dietary content, such as calories, fat, sodium, cholesterol and nutrients.;Long Term Goal: Adherence to prescribed nutrition plan.             Nutrition Assessments:  MEDIFICTS Score Key: >=70 Need to make dietary changes  40-70 Heart Healthy Diet <= 40  Therapeutic Level Cholesterol Diet  Flowsheet Row PULMONARY REHAB OTHER RESPIRATORY from 10/14/2020 in [facility] for Heart, Vascular, & Lung Health  Picture Your Plate Total Score on Admission 55      Picture Your Plate Scores: <04 Unhealthy dietary pattern with much room for improvement. 41-50 Dietary pattern unlikely to meet recommendations for good health and room for improvement. 51-60 More healthful dietary pattern, with some room for improvement.  >60 Healthy dietary pattern, although there may be some specific behaviors that could be improved.    Nutrition Goals Re-Evaluation:  Nutrition Goals Re-Evaluation     Row Name 10/26/23 1040             Goals   Current Weight 174 lb 2.6 oz (79 kg)  Comment Lipids WNL, LDL 98       Expected Outcome Patient will benefit from participation in intensive cardiac rehab for nutrition, exercise, and lifestyle modification.                Nutrition Goals Discharge (Final Nutrition Goals Re-Evaluation):  Nutrition Goals Re-Evaluation - 10/26/23 1040       Goals   Current Weight 174 lb 2.6 oz (79 kg)    Comment Lipids WNL, LDL 98    Expected Outcome Patient will benefit from participation in intensive cardiac rehab for nutrition, exercise, and lifestyle modification.             Psychosocial: Target Goals: Acknowledge presence or absence of significant depression and/or stress, maximize coping skills, provide positive support system. Participant is able to verbalize types and ability to use techniques and skills needed for reducing stress and depression.  Initial Review & Psychosocial Screening:  Initial Psych Review & Screening - 09/28/23 1045       Initial Review   Current issues with None Identified    Comments Pt denies any psychosocial barriers at this time.      Family Dynamics   Good Support System? Yes    Comments Pt states she is in contact with children      Screening  Interventions   Interventions Encouraged to exercise             Quality of Life Scores:  Scores of 19 and below usually indicate a poorer quality of life in these areas.  A difference of  2-3 points is a clinically meaningful difference.  A difference of 2-3 points in the total score of the Quality of Life Index has been associated with significant improvement in overall quality of life, self-image, physical symptoms, and general health in studies assessing change in quality of life.  PHQ-9: Review Flowsheet  More data exists      09/28/2023 08/11/2022 11/23/2020 10/01/2020 11/02/2017  Depression screen PHQ 2/9  Decreased Interest 0 0 1 0 0  Down, Depressed, Hopeless 1 0 0 2 0  PHQ - 2 Score 1 0 1 2 0  Altered sleeping 1 0 0 2 -  Tired, decreased energy 1 3 1 3  -  Change in appetite 0 0 0 0 -  Feeling bad or failure about yourself  0 0 0 0 -  Trouble concentrating 0 0 0 0 -  Moving slowly or fidgety/restless 0 0 0 0 -  Suicidal thoughts 0 0 0 0 -  PHQ-9 Score 3 3 2 7  -  Difficult doing work/chores Not difficult at all Not difficult at all Somewhat difficult Somewhat difficult -   Interpretation of Total Score  Total Score Depression Severity:  1-4 = Minimal depression, 5-9 = Mild depression, 10-14 = Moderate depression, 15-19 = Moderately severe depression, 20-27 = Severe depression   Psychosocial Evaluation and Intervention:  Psychosocial Evaluation - 09/28/23 1047       Psychosocial Evaluation & Interventions   Interventions Encouraged to exercise with the program and follow exercise prescription    Comments Pt denies any psychosocial barriers at this time.    Expected Outcomes For Jahmiyah to participate in rehab free of psychosocial barriers.    Continue Psychosocial Services  No Follow up required             Psychosocial Re-Evaluation:  Psychosocial Re-Evaluation     Row Name 10/03/23 1020 10/24/23 1334  Psychosocial Re-Evaluation   Current  issues with -- None Identified      Comments Audia was scheduled to start the program on 10/02/23, but did not show up. No new barriers or concerns since orientation on 09/28/23. Dot denies any new psychosocial barriers or concerns at this time.      Expected Outcomes For Camillia to participate in PR free of any psychosocial barriers or concerns. For Journie to participate in PR free of any psychosocial barriers or concerns.      Interventions -- Encouraged to attend Pulmonary Rehabilitation for the exercise      Continue Psychosocial Services  No Follow up required No Follow up required               Psychosocial Discharge (Final Psychosocial Re-Evaluation):  Psychosocial Re-Evaluation - 10/24/23 1334       Psychosocial Re-Evaluation   Current issues with None Identified    Comments Henri denies any new psychosocial barriers or concerns at this time.    Expected Outcomes For Ileigh to participate in PR free of any psychosocial barriers or concerns.    Interventions Encouraged to attend Pulmonary Rehabilitation for the exercise    Continue Psychosocial Services  No Follow up required             Education: Education Goals: Education classes will be provided on a weekly basis, covering required topics. Participant will state understanding/return demonstration of topics presented.  Learning Barriers/Preferences:   Education Topics: Know Your Numbers Group instruction that is supported by a PowerPoint presentation. Instructor discusses importance of knowing and understanding resting, exercise, and post-exercise oxygen saturation, heart rate, and blood pressure. Oxygen saturation, heart rate, blood pressure, rating of perceived exertion, and dyspnea are reviewed along with a normal range for these values.    Exercise for the Pulmonary Patient Group instruction that is supported by a PowerPoint presentation. Instructor discusses benefits of exercise, core components  of exercise, frequency, duration, and intensity of an exercise routine, importance of utilizing pulse oximetry during exercise, safety while exercising, and options of places to exercise outside of rehab.    MET Level  Group instruction provided by PowerPoint, verbal discussion, and written material to support subject matter. Instructor reviews what METs are and how to increase METs.  Flowsheet Row PULMONARY REHAB OTHER RESPIRATORY from 10/04/2023 in [facility]  Date 10/04/23  Educator EP  Instruction Review Code 1- Verbalizes Understanding       Pulmonary Medications Verbally interactive group education provided by instructor with focus on inhaled medications and proper administration.   Anatomy and Physiology of the Respiratory System Group instruction provided by PowerPoint, verbal discussion, and written material to support subject matter. Instructor reviews respiratory cycle and anatomical components of the respiratory system and their functions. Instructor also reviews differences in obstructive and restrictive respiratory diseases with examples of each.  Flowsheet Row PULMONARY REHAB OTHER RESPIRATORY from 10/18/2023 in [facility]  Date 10/18/23  Educator RT  Instruction Review Code 1- Verbalizes Understanding       Oxygen Safety Group instruction provided by PowerPoint, verbal discussion, and written material to support subject matter. There is an overview of "What is Oxygen" and "Why do we need it".  Instructor also reviews how to create a safe environment for oxygen use, the importance of using oxygen as prescribed, and the risks of noncompliance. There is a brief discussion on traveling  with oxygen and resources the patient may utilize. Flowsheet Row PULMONARY REHAB OTHER RESPIRATORY from 11/11/2020 in [facility]  Date 10/14/20  Educator Handout       Oxygen Use Group instruction provided by PowerPoint, verbal discussion, and written material to discuss how supplemental oxygen is prescribed and different types of oxygen supply systems. Resources for more information are provided.    Breathing Techniques Group instruction that is supported by demonstration and informational handouts. Instructor discusses the benefits of pursed lip and diaphragmatic breathing and detailed demonstration on how to perform both.     Risk Factor Reduction Group instruction that is supported by a PowerPoint presentation. Instructor discusses the definition of a risk factor, different risk factors for pulmonary disease, and how the heart and lungs work together.   Pulmonary Diseases Group instruction provided by PowerPoint, verbal discussion, and written material to support subject matter. Instructor gives an overview of the different type of pulmonary diseases. There is also a discussion on risk factors and symptoms as well as ways to manage the diseases. Flowsheet Row PULMONARY REHAB OTHER RESPIRATORY from 10/11/2023 in [facility] for [facility], Vascular, & Lung Health  Date 10/11/23  Educator RT  Instruction Review Code 1- Verbalizes Understanding       Stress and Energy Conservation Group instruction provided by PowerPoint, verbal discussion, and written material to support subject matter. Instructor gives an overview of stress and the impact it can have on the body. Instructor also reviews ways to reduce stress. There is also a discussion on energy conservation and ways to conserve energy throughout the day.   Warning Signs and Symptoms Group instruction provided by PowerPoint, verbal discussion, and written material to support subject matter. Instructor reviews warning signs and symptoms of stroke, heart attack, cold and flu. Instructor also reviews ways to prevent the spread of  infection.   Other Education Group or individual verbal, written, or video instructions that support the educational goals of the pulmonary rehab program. Flowsheet Row PULMONARY REHAB OTHER RESPIRATORY from 10/25/2023 in [facility], [facility]  Date 10/25/23  Educator EP  Instruction Review Code 1- Verbalizes Understanding        Knowledge Questionnaire Score:  Knowledge Questionnaire Score - 09/28/23 1118       Knowledge Questionnaire Score   Pre Score 13/18             Core Components/Risk Factors/Patient Goals at Admission:  Personal Goals and Risk Factors at Admission - 09/28/23 1045       Core Components/Risk Factors/Patient Goals on Admission   Improve shortness of breath with ADL's Yes    Intervention Provide education, individualized exercise plan and daily activity instruction to help decrease symptoms of SOB with activities of daily living.    Expected Outcomes Short Term: Improve cardiorespiratory fitness to achieve a reduction of symptoms when performing ADLs;Long Term: Be able to perform more ADLs without symptoms or delay the onset of symptoms             Core Components/Risk Factors/Patient Goals Review:   Goals and Risk Factor Review     Row Name 10/03/23 1023 10/24/23 1335           Core Components/Risk Factors/Patient Goals Review   Personal Goals Review Improve shortness of breath with ADL's;Develop more efficient breathing techniques such as purse lipped breathing and diaphragmatic breathing and practicing self-pacing with  activity. Improve shortness of breath with ADL's;Develop more efficient breathing techniques such as purse lipped breathing and diaphragmatic breathing and practicing self-pacing with activity.      Review Unable to assess Beatrices goals at this time. She was scheduled to start on 10/02/23, but was unable to attend. Goal progressing for improving shortness of breath. Jameela is  currently using 2L O2  while exercising to keep sats >88%. Goal progressing for developing more efficient breathing techniques such as purse lipped breathing and diaphragmatic breathing; and practicing self-pacing with activity. Amaia is currently exercising on the recumbant elliptical and the recumbant bike. We will continue to monitor her progress throughout the program.      Expected Outcomes To improve shortness of breath with ADL's and develop more efficient breathing techniques such as purse lipped breathing and diaphragmatic breathing; and practicing self-pacing with activity To improve shortness of breath with ADL's and develop more efficient breathing techniques such as purse lipped breathing and diaphragmatic breathing; and practicing self-pacing with activity               Core Components/Risk Factors/Patient Goals at Discharge (Final Review):   Goals and Risk Factor Review - 10/24/23 1335       Core Components/Risk Factors/Patient Goals Review   Personal Goals Review Improve shortness of breath with ADL's;Develop more efficient breathing techniques such as purse lipped breathing and diaphragmatic breathing and practicing self-pacing with activity.    Review Goal progressing for improving shortness of breath. Keora is currently using 2L O2  while exercising to keep sats >88%. Goal progressing for developing more efficient breathing techniques such as purse lipped breathing and diaphragmatic breathing; and practicing self-pacing with activity. Rosy is currently exercising on the recumbant elliptical and the recumbant bike. We will continue to monitor her progress throughout the program.    Expected Outcomes To improve shortness of breath with ADL's and develop more efficient breathing techniques such as purse lipped breathing and diaphragmatic breathing; and practicing self-pacing with activity             ITP Comments: Pt is making expected progress toward Pulmonary  Rehab goals after completing 6 session(s). Recommend continued exercise, life style modification, education, and utilization of breathing techniques to increase stamina and strength, while also decreasing shortness of breath with exertion.  Dr. Mechele Collin is Medical Director for Pulmonary Rehab at [facility].

## 2023-11-01 ENCOUNTER — Encounter (HOSPITAL_COMMUNITY)
Admission: RE | Admit: 2023-11-01 | Discharge: 2023-11-01 | Disposition: A | Discharge status: Home / Self Care | Payer: 59 | Source: Ambulatory Visit | Attending: Pulmonary Disease | Admitting: Pulmonary Disease

## 2023-11-01 DIAGNOSIS — J849 Interstitial pulmonary disease, unspecified: Secondary | ICD-10-CM

## 2023-11-01 DIAGNOSIS — J984 Other disorders of lung: Secondary | ICD-10-CM

## 2023-11-01 NOTE — Progress Notes (Signed)
 Daily Session Note  Patient Details  Name: Tiffany Coleman MRN: [id number] Date of Birth: 01-26-1956 Referring Provider:   Doristine Devoid Pulmonary Rehab Walk Test from 09/28/2023 in [facility]  Referring Provider [name]       Encounter Date: 11/01/2023  Check In:  Session Check In - 11/01/23 1322       Check-In   Supervising physician immediately available to respond to emergencies [facility] MD immediately available    Physician(s) Reather Littler, NP    Location [facility]    Staff Present Raford Pitcher, MS, ACSM-CEP, Exercise Physiologist;Randi Dionisio Paschal, ACSM-CEP, Exercise Physiologist;Casey Synthia Innocent, RN, BSN;Samantha Belarus, RD, LDN    Virtual Visit No    Medication changes reported     No    Fall or balance concerns reported    No    Tobacco Cessation No Change    Warm-up and Cool-down Performed as group-led instruction    Resistance Training Performed Yes    VAD Patient? No    PAD/SET Patient? No      Pain Assessment   Currently in Pain? No/denies    Multiple Pain Sites No             Capillary Blood Glucose: No results found for this or any previous visit (from the past 24 hours).    Social History   Tobacco Use  Smoking Status Former   Current packs/day: 0.00   Types: Cigarettes   Start date: 05/26/2000   Quit date: 05/26/2020   Years since quitting: 3.4   Passive exposure: Past  Smokeless Tobacco Never    Goals Met:  Proper associated with RPD/PD & O2 Sat Exercise tolerated well No report of concerns or symptoms today Strength training completed today  Goals Unmet:  Not Applicable  Comments: Service time is from 1300 to 1432.    Dr. Mechele Collin is Medical Director for Pulmonary Rehab at [facility].

## 2023-11-06 ENCOUNTER — Encounter (HOSPITAL_COMMUNITY)
Admission: RE | Admit: 2023-11-06 | Discharge: 2023-11-06 | Disposition: A | Discharge status: Home / Self Care | Payer: 59 | Source: Ambulatory Visit | Attending: Pulmonary Disease | Admitting: Pulmonary Disease

## 2023-11-06 DIAGNOSIS — J849 Interstitial pulmonary disease, unspecified: Secondary | ICD-10-CM | POA: Diagnosis not present

## 2023-11-06 DIAGNOSIS — J984 Other disorders of lung: Secondary | ICD-10-CM

## 2023-11-06 NOTE — Progress Notes (Signed)
 Daily Session Note  Patient Details  Name: Tiffany Coleman MRN: [id number] Date of Birth: 03-08-1956 Referring Provider:   Doristine Devoid Pulmonary Rehab Walk Test from 09/28/2023 in [facility]  Referring Provider [name]       Encounter Date: 11/06/2023  Check In:  Session Check In - 11/06/23 1327       Check-In   Supervising physician immediately available to respond to emergencies [facility] MD immediately available    Physician(s) Eligha Bridegroom, NP    Location [facility]    Staff Present Raford Pitcher, MS, ACSM-CEP, Exercise Physiologist;Randi Dionisio Paschal, ACSM-CEP, Exercise Physiologist;Casey Synthia Innocent, RN, BSN;Samantha Belarus, RD, LDN;Johnny Hale Bogus, MS, Exercise Physiologist    Virtual Visit No    Medication changes reported     No    Fall or balance concerns reported    No    Tobacco Cessation No Change    Warm-up and Cool-down Performed as group-led instruction    Resistance Training Performed Yes    VAD Patient? No    PAD/SET Patient? No      Pain Assessment   Currently in Pain? No/denies    Multiple Pain Sites No             Capillary Blood Glucose: No results found for this or any previous visit (from the past 24 hours).    Social History   Tobacco Use  Smoking Status Former   Current packs/day: 0.00   Types: Cigarettes   Start date: 05/26/2000   Quit date: 05/26/2020   Years since quitting: 3.4   Passive exposure: Past  Smokeless Tobacco Never    Goals Met:  Exercise tolerated well No report of concerns or symptoms today Strength training completed today  Goals Unmet:  Not Applicable  Comments: Service time is from 1311 to 1442    Dr. Mechele Collin is Medical Director for Pulmonary Rehab at [facility]

## 2023-11-06 NOTE — Progress Notes (Signed)
 Home Exercise Prescription I have reviewed a Home Exercise Prescription with Tiffany Coleman. She is currently not doing any aerobic exercise but is working on the warm up and cool down exercises. Discussed walking or dancing for 15 min 1-2 nonrehab days and building up to 30 min at a time. She agreed. She is eager to build up her tolerance and to get off oxygen. The patient stated that their goals were to get back to work. We reviewed exercise guidelines, target heart rate during exercise, RPE Scale, weather conditions, endpoints for exercise, warmup and cool down. The patient is encouraged to come to me with any questions. I will continue to follow up with the patient to assist them with progression and safety.  Spent 15 min discussing home exercise plan and goals.  Sharmeka Palmisano [place], [place], ACSM-CEP 11/06/2023 3:15 PM

## 2023-11-07 ENCOUNTER — Ambulatory Visit: Payer: 59 | Admitting: Pulmonary Disease

## 2023-11-07 ENCOUNTER — Encounter: Payer: Self-pay | Admitting: Pulmonary Disease

## 2023-11-07 VITALS — BP 100/60 | HR 77 | Temp 98.2°F | Ht 63.5 in | Wt 172.6 lb

## 2023-11-07 DIAGNOSIS — G4733 Obstructive sleep apnea (adult) (pediatric): Secondary | ICD-10-CM | POA: Diagnosis not present

## 2023-11-07 DIAGNOSIS — J849 Interstitial pulmonary disease, unspecified: Secondary | ICD-10-CM

## 2023-11-07 DIAGNOSIS — U099 Post covid-19 condition, unspecified: Secondary | ICD-10-CM

## 2023-11-07 DIAGNOSIS — Z5181 Encounter for therapeutic drug level monitoring: Secondary | ICD-10-CM | POA: Diagnosis not present

## 2023-11-07 LAB — COMPREHENSIVE METABOLIC PANEL
ALT: 8 U/L (ref 0–35)
AST: 16 U/L (ref 0–37)
Albumin: 4.1 g/dL (ref 3.5–5.2)
Alkaline Phosphatase: 52 U/L (ref 39–117)
BUN: 21 mg/dL (ref 6–23)
CO2: 31 meq/L (ref 19–32)
Calcium: 9.9 mg/dL (ref 8.4–10.5)
Chloride: 104 meq/L (ref 96–112)
Creatinine, Ser: 1.11 mg/dL (ref 0.40–1.20)
GFR: 50.57 mL/min — ABNORMAL LOW (ref >60.00)
Glucose, Bld: 83 mg/dL (ref 70–99)
Potassium: 4.2 meq/L (ref 3.5–5.1)
Sodium: 140 meq/L (ref 135–145)
Total Bilirubin: 0.6 mg/dL (ref 0.2–1.2)
Total Protein: 6.9 g/dL (ref 6.0–8.3)

## 2023-11-07 LAB — CBC WITH DIFFERENTIAL/PLATELET
Basophils Absolute: 0 10*3/uL (ref 0.0–0.1)
Basophils Relative: 1 % (ref 0.0–3.0)
Eosinophils Absolute: 0.3 10*3/uL (ref 0.0–0.7)
Eosinophils Relative: 5.4 % — ABNORMAL HIGH (ref 0.0–5.0)
HCT: 33.4 % — ABNORMAL LOW (ref 36.0–46.0)
Hemoglobin: 11.1 g/dL — ABNORMAL LOW (ref 12.0–15.0)
Lymphocytes Relative: 26.8 % (ref 12.0–46.0)
Lymphs Abs: 1.3 10*3/uL (ref 0.7–4.0)
MCHC: 33.2 g/dL (ref 30.0–36.0)
MCV: 90.4 fl (ref 78.0–100.0)
Monocytes Absolute: 0.4 10*3/uL (ref 0.1–1.0)
Monocytes Relative: 9.2 % (ref 3.0–12.0)
Neutro Abs: 2.8 10*3/uL (ref 1.4–7.7)
Neutrophils Relative %: 57.6 % (ref 43.0–77.0)
Platelets: 252 10*3/uL (ref 150.0–400.0)
RBC: 3.69 Mil/uL — ABNORMAL LOW (ref 3.87–5.11)
RDW: 14.4 % (ref 11.5–15.5)
WBC: 4.9 10*3/uL (ref 4.0–10.5)

## 2023-11-07 NOTE — Patient Instructions (Signed)
 VISIT SUMMARY:  Today, we reviewed your progress with pulmonary rehabilitation and oxygen therapy. You have shown significant improvement, maintaining good oxygen levels during exercise without supplemental oxygen. We also discussed your current medications and overall health, including your diabetes management and weight loss.  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 You have been managing well with pulmonary rehabilitation and azathioprine, and we will continue to monitor your progress. Please continue your pulmonary rehabilitation, monitor your oxygen levels during exercise, and take azathioprine as prescribed. We will perform a CT scan of your lungs on November 09, 2023, and conduct blood work to monitor the effects of azathioprine. If your oxygen levels remain stable, we may consider discontinuing oxygen therapy.  -MUSCULOSKELETAL PAIN: You experience lung soreness likely due to inflammation from overexertion during exercise. Continue taking ibuprofen 800 mg as needed to relieve this soreness.  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 Since stopping prednisone, your diabetes has improved, and you have experienced significant weight loss. Continue to manage your diabetes as advised.  INSTRUCTIONS:  Please ensure you have your CT scan on November 09, 2023, and complete your blood work as planned. Schedule a follow-up appointment with pulmonology in six months to review your progress and the CT scan results.

## 2023-11-07 NOTE — Progress Notes (Signed)
 Tiffany Coleman    [id number]    [birth year]  Primary Care Physician:Avbuere, Dorma Russell, MD  Referring Physician: Fleet Contras, MD [address] [place],  [place] [address]  Chief complaint:  Follow-up for asthma mild, post COVID-19 Started azathioprine in June 2022  HPI: 68 y.o.  with history of asthma, morbid obesity, OSA  Recent hospitalization for COVID-19 infection with prolonged hospital stay and discharged on 05/11/20 She was readmitted back to [facility] long hospital on 10/13 with hypoxic respiratory failure.  No PE on CTA but imaging shows extensive bilateral infiltrates. 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 Shirlee Latch in November 2022 with mild pulmonary venous hypertension.  She is on Lasix for diuresis   Started on azathioprine in June 2022 as she was unable to come off steroids in spite of multiple attempts at taper. She developed COVID again in early August 2023 and was treated with Paxlovid.    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 No mold, hot tub, Jacuzzi Smoking history: Never smoker Travel history: Originally from [place]. No significant recent travel Relevant family history: No significant family issue of lung disease.  Interim history:  Discussed the use of AI scribe software for clinical note transcription with the patient, who gave verbal consent to proceed.  History of Present Illness Tiffany Coleman is a 68 year old female with post-COVID pneumonitis and interstitial lung disease who presents for follow-up regarding her pulmonary rehabilitation and oxygen therapy.  She has experienced significant improvement in her condition since starting pulmonary  rehabilitation in February. She is now able to walk without supplemental oxygen, maintaining oxygen saturation levels in the nineties during activities such as walking twelve laps and using the exercise bike. She hopes to eventually discontinue oxygen therapy, which she has been on for nearly four years.  She stopped taking prednisone approximately five to six months ago, around September or October 2024, after being on it for a prolonged period due to post-COVID pneumonitis and ILD. She attributes her ability to discontinue prednisone to the use of azathioprine, which she currently takes at a dose of three tablets per day. She has experienced significant weight loss, from 260 pounds to 172 pounds, and improvement in her diabetes since stopping prednisone.  She mentions occasional lung inflammation, particularly when engaging in excessive physical activity, for which she takes ibuprofen 800 mg to alleviate soreness.  She has not had a CT scan recently but has an appointment scheduled for November 09, 2023. She also notes that she has not had recent blood work and plans to have it done to monitor her condition while on azathioprine.    Outpatient Encounter Medications as of 11/07/2023  Medication Sig   acetaminophen (TYLENOL) 325 MG tablet Take 2 tablets (650 mg total) by mouth every 6 (six) hours as needed for mild pain or headache (fever >/= 101).   albuterol (2.5 MG/3ML) 0.083% NEBU 3 mL, albuterol (5 MG/ML) 0.5% NEBU 0.5 mL Inhale 5 mg into the lungs once a week.   albuterol (VENTOLIN HFA) 108 (90 Base) MCG/ACT inhaler Inhale 2 puffs into the lungs every 6 (six) hours as needed for wheezing or shortness of breath.  azaTHIOprine (IMURAN) 50 MG tablet TAKE 3 TABLETS(150 MG) BY MOUTH DAILY   diclofenac Sodium (VOLTAREN) 1 % GEL SMARTSIG:4 Gram(s) Topical 4 Times Daily PRN   fluticasone (FLONASE) 50 MCG/ACT nasal spray Place 2 sprays into both nostrils daily. (Patient taking differently: Place 2 sprays  into both nostrils daily as needed for rhinitis.)   furosemide (LASIX) 20 MG tablet Take 1 tablet (20 mg total) by mouth 2 (two) times daily. NEEDS FOLLOW UP APPOINTMENT FOR MORE REFILLS   hydrOXYzine (ATARAX) 25 MG tablet Take 25 mg by mouth 2 (two) times daily as needed. for anxiety   ibuprofen (ADVIL) 800 MG tablet Take 1 tablet (800 mg total) by mouth 3 (three) times daily.   ipratropium-albuterol (DUONEB) 0.5-2.5 (3) MG/3ML SOLN Take 3 mLs by nebulization every 6 (six) hours as needed.   MOUNJARO 2.5 MG/0.5ML Pen SMARTSIG:2.5 Milligram(s) SUB-Q Once a Week   ondansetron (ZOFRAN-ODT) 4 MG disintegrating tablet Take 1 tablet (4 mg total) by mouth every 8 (eight) hours as needed.   pantoprazole (PROTONIX) 40 MG tablet Take 1 tablet (40 mg total) by mouth daily at 6 (six) AM.   potassium chloride (KLOR-CON M) 10 MEQ tablet Take 1 tablet (10 mEq total) by mouth daily. Absolute last refill without office visit please call [phone] to schedule   promethazine-dextromethorphan (PROMETHAZINE-DM) 6.25-15 MG/5ML syrup Take 10 mLs by mouth 2 (two) times daily as needed for cough.   SYMBICORT 160-4.5 MCG/ACT inhaler SMARTSIG:2 Puff(s) By Mouth Twice Daily   zolpidem (AMBIEN) 10 MG tablet Take 10 mg by mouth at bedtime as needed for sleep.   predniSONE (DELTASONE) 2.5 MG tablet Take 2.5mg  daily x's 2 weeks then 2.5mg  every other day x's 2 weeks (Patient not taking: Reported on 11/07/2023)   No facility-administered encounter medications on file as of 11/07/2023.    Physical Exam: Blood pressure 100/60, pulse 77, temperature 98.2 F (36.8 C), temperature source Oral, height 5' 3.5" (1.613 m), weight 172 lb 9.6 oz (78.3 kg), SpO2 99%. Gen:      No acute distress HEENT:  EOMI, sclera anicteric Neck:     No masses; no thyromegaly Lungs:    Clear to auscultation bilaterally; normal respiratory effort CV:         Regular rate and rhythm; no murmurs Abd:      + bowel sounds; soft, non-tender; no palpable  masses, no distension Ext:    No edema; adequate peripheral perfusion Skin:      Warm and dry; no rash Neuro: alert and oriented x 3 Psych: normal mood and affect   Data Reviewed: Imaging: CTA 05/26/2020-no PE, diffuse bilateral interstitial infiltrates and groundglass opacities.  Overall improved compared to prior CT. Chest x-ray 07/13/2020 -persistent bilateral pulmonary infiltrates.  I have reviewed the images personally. High-res CT 10/01/2020-improvement in groundglass opacities with development of fibrotic changes consistent with evolving post-COVID ILD.  CTA 03/01/2021-no pulmonary embolism, stable diffuse interstitial opacities. High-res CT 11/24/2021-stable pattern of groundglass and fibrotic changes. High-resolution CT 09/20/2022-stable pattern of groundglass, fibrotic changes consistent with post-COVID ILD I have reviewed the images personally.  PFTs: 08/31/2020 FVC 1.35 [50%], FEV1 1.28 [69%], F/F 95, TLC 1.99 [40%], DLCO 8.42 [44%] Severe restriction, diffusion defect  Labs: CBC 07/13/2020-WBC 10.8, eos 0.1%, absolute eosinophil count 11 CMP 07/13/2020-normal except for slight increase in bicarb and glucose  CTD serologies 02/08/2021- 1:320, cytoplasmic  Cardiac: Echocardiogram 04/24/2020-LVEF 60 to 65%, moderate concentric LVH, RV systolic size and function are normal.  RHC 05/20/2021 1. Mildly elevated  right-sided filling pressures.  2. Mild pulmonary venous hypertension.  3. Normal PCWP.   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 Since starting pulmonary rehabilitation in February, her oxygen levels remain in the 90s during exercise without supplemental oxygen, with the goal of potentially discontinuing oxygen therapy. Azathioprine has replaced prednisone, which was discontinued around September or October 2024 due to significant weight gain and  diabetes. Her condition is well-managed on azathioprine, with improved diabetes and significant weight loss.  - Continue pulmonary rehabilitation - Monitor oxygen levels during exercise - Order CT scan of the lungs on November 09, 2023 - Perform blood work to monitor azathioprine effects - Continue azathioprine 3 tablets daily - Consider discontinuing oxygen therapy if oxygen levels remain stable  Asthma She has history of asthma but no obstruction on PFTs.  She has not tolerated Symbicort due to thrush Continue duo nebs, albuterol as needed  HFpEF Echo reviewed with EF 60-65%, LVH Right heart cath with no evidence of pulmonary arterial hypertension Continue Lasix Cardiology follow-up  OSA Intolerant of CPAP and is not interested in retrying Split-night sleep study ordered in 2023 but patient canceled as she just had her hair done and did not want to disturb it   Musculoskeletal pain She experiences lung soreness likely due to inflammation from overexertion during exercise, relieved by ibuprofen 800 mg as needed. - Continue ibuprofen 800 mg as needed for lung soreness  Diabetes mellitus Developed as a side effect of long-term prednisone use. Discontinuation of prednisone has led to improved diabetes and significant weight loss from 260 pounds to 172 pounds.  Follow-up A CT scan of the lungs is scheduled for November 09, 2023, with results expected in 2-3 weeks. Follow-up with pulmonology is planned in six months. - Schedule follow-up appointment in six months - Review CT scan results when available    Plan/Recommendations: Continue azathioprine to 150 mg Check CBC, CMP Continue pulm rehab Follow CT chest  Chilton Greathouse MD [facility] 11/07/2023, 10:04 AM  CC: Fleet Contras, MD

## 2023-11-08 ENCOUNTER — Encounter (HOSPITAL_COMMUNITY)
Admission: RE | Admit: 2023-11-08 | Discharge: 2023-11-08 | Disposition: A | Discharge status: Home / Self Care | Payer: 59 | Source: Ambulatory Visit | Attending: Pulmonary Disease | Admitting: Pulmonary Disease

## 2023-11-08 DIAGNOSIS — J849 Interstitial pulmonary disease, unspecified: Secondary | ICD-10-CM | POA: Diagnosis not present

## 2023-11-08 DIAGNOSIS — J984 Other disorders of lung: Secondary | ICD-10-CM

## 2023-11-08 NOTE — Progress Notes (Signed)
 Daily Session Note  Patient Details  Name: Tiffany Coleman MRN: [id number] Date of Birth: 1956-07-23 Referring Provider:   Doristine Devoid Pulmonary Rehab Walk Test from 09/28/2023 in [facility]  Referring Provider [name]       Encounter Date: 11/08/2023  Check In:  Session Check In - 11/08/23 1334       Check-In   Supervising physician immediately available to respond to emergencies [facility] MD immediately available    Physician(s) Jari Favre, PA    Location [facility]    Staff Present Raford Pitcher, MS, ACSM-CEP, Exercise Physiologist;Randi Dionisio Paschal, ACSM-CEP, Exercise Physiologist;Kysean Sweet Synthia Innocent, RN, BSN;Samantha Belarus, RD, Gaylord Shih, MS, Exercise Physiologist    Virtual Visit No    Medication changes reported     No    Fall or balance concerns reported    No    Tobacco Cessation No Change    Warm-up and Cool-down Performed as group-led instruction    Resistance Training Performed Yes    VAD Patient? No    PAD/SET Patient? No      Pain Assessment   Currently in Pain? No/denies    Multiple Pain Sites No             Capillary Blood Glucose: No results found for this or any previous visit (from the past 24 hours).    Social History   Tobacco Use  Smoking Status Former   Current packs/day: 0.00   Types: Cigarettes   Start date: 05/26/2000   Quit date: 05/26/2020   Years since quitting: 3.4   Passive exposure: Past  Smokeless Tobacco Never    Goals Met:  Proper associated with RPD/PD & O2 Sat Independence with exercise equipment Exercise tolerated well No report of concerns or symptoms today Strength training completed today  Goals Unmet:  Not Applicable  Comments: Service time is from 1309 to 1445.    Dr. Mechele Collin is Medical Director for Pulmonary Rehab at [facility].

## 2023-11-09 ENCOUNTER — Ambulatory Visit
Admission: RE | Admit: 2023-11-09 | Discharge: 2023-11-09 | Disposition: A | Discharge status: Home / Self Care | Payer: 59 | Source: Ambulatory Visit | Attending: Pulmonary Disease | Admitting: Pulmonary Disease

## 2023-11-09 ENCOUNTER — Telehealth (HOSPITAL_COMMUNITY): Payer: Self-pay

## 2023-11-09 DIAGNOSIS — J849 Interstitial pulmonary disease, unspecified: Secondary | ICD-10-CM

## 2023-11-09 NOTE — Telephone Encounter (Signed)
 Dr. Isaiah Serge, Can we have an order to increase Tiffany Coleman's target heart rate to 136 while exercising in pulmonary rehab?  Thanks, Durel Salts, RRT

## 2023-11-12 NOTE — Telephone Encounter (Signed)
 Okay to increase target heart rate to 136 in pulmonary rehab

## 2023-11-13 ENCOUNTER — Encounter (HOSPITAL_COMMUNITY)
Admission: RE | Admit: 2023-11-13 | Discharge: 2023-11-13 | Disposition: A | Discharge status: Home / Self Care | Payer: 59 | Source: Ambulatory Visit | Attending: Pulmonary Disease | Admitting: Pulmonary Disease

## 2023-11-13 VITALS — Wt 174.4 lb

## 2023-11-13 DIAGNOSIS — J984 Other disorders of lung: Secondary | ICD-10-CM | POA: Insufficient documentation

## 2023-11-13 DIAGNOSIS — J849 Interstitial pulmonary disease, unspecified: Secondary | ICD-10-CM | POA: Diagnosis present

## 2023-11-13 NOTE — Progress Notes (Signed)
 Daily Session Note  Patient Details  Name: Tiffany Coleman MRN: [id number] Date of Birth: April 08, 1956 Referring Provider:   Doristine Devoid Pulmonary Rehab Walk Test from 09/28/2023 in [facility]  Referring Provider [name]       Encounter Date: 11/13/2023  Check In:  Session Check In - 11/13/23 1502       Check-In   Supervising physician immediately available to respond to emergencies [facility] MD immediately available    Physician(s) Jari Favre, PA    [facility]    Staff Present Raford Pitcher, MS, ACSM-CEP, Exercise Physiologist;Randi Dionisio Paschal, ACSM-CEP, Exercise Physiologist;Casey Synthia Innocent, RN, BSN;Samantha Belarus, RD, LDN    Virtual Visit No    Medication changes reported     No    Fall or balance concerns reported    No    Tobacco Cessation No Change    Warm-up and Cool-down Performed as group-led instruction    Resistance Training Performed Yes    VAD Patient? No    PAD/SET Patient? No      Pain Assessment   Currently in Pain? No/denies    Multiple Pain Sites No             Capillary Blood Glucose: No results found for this or any previous visit (from the past 24 hours).   Exercise Prescription Changes - 11/13/23 1500       Response to Exercise   Blood Pressure (Admit) 118/68    Blood Pressure (Exercise) 128/60    Blood Pressure (Exit) 122/70    Heart Rate (Admit) 70 bpm    Heart Rate (Exercise) 107 bpm    Heart Rate (Exit) 85 bpm    Oxygen Saturation (Admit) 100 %   RA   Oxygen Saturation (Exercise) 92 %   RA   Oxygen Saturation (Exit) 98 %   RA   Rating of Perceived Exertion (Exercise) 11    Perceived Dyspnea (Exercise) 1    Duration Continue with 30 min of aerobic exercise without signs/symptoms of physical distress.    Intensity THRR unchanged      Progression   Progression Continue to progress workloads to maintain intensity without signs/symptoms of  physical distress.      Resistance Training   Training Prescription Yes    Weight red bands    Reps 10-15    Time 10 Minutes      Oxygen   Oxygen Intermittent    Liters 0-2      Recumbant Elliptical   Level 3    RPM 70    Watts 84    Minutes 15    METs 4.2      Track   Laps 13    Minutes 15    METs 3      Oxygen   Maintain Oxygen Saturation 88% or higher             Social History   Tobacco Use  Smoking Status Former   Current packs/day: 0.00   Types: Cigarettes   Start date: 05/26/2000   Quit date: 05/26/2020   Years since quitting: 3.4   Passive exposure: Past  Smokeless Tobacco Never    Goals Met:  Exercise tolerated well No report of concerns or symptoms today Strength training completed today  Goals Unmet:  Not Applicable  Comments: Service time is from 1307 to 1441    Dr. Mechele Collin is Medical Director for Pulmonary  Rehab at [facility].

## 2023-11-14 ENCOUNTER — Other Ambulatory Visit: Payer: Self-pay | Admitting: Podiatry

## 2023-11-14 ENCOUNTER — Ambulatory Visit (INDEPENDENT_AMBULATORY_CARE_PROVIDER_SITE_OTHER)

## 2023-11-14 ENCOUNTER — Ambulatory Visit (INDEPENDENT_AMBULATORY_CARE_PROVIDER_SITE_OTHER): Admitting: Podiatry

## 2023-11-14 DIAGNOSIS — M21619 Bunion of unspecified foot: Secondary | ICD-10-CM

## 2023-11-14 DIAGNOSIS — M19071 Primary osteoarthritis, right ankle and foot: Secondary | ICD-10-CM

## 2023-11-14 DIAGNOSIS — M19072 Primary osteoarthritis, left ankle and foot: Secondary | ICD-10-CM | POA: Diagnosis not present

## 2023-11-14 DIAGNOSIS — Z01818 Encounter for other preprocedural examination: Secondary | ICD-10-CM | POA: Diagnosis not present

## 2023-11-14 NOTE — Progress Notes (Signed)
 Subjective:  Patient ID: Tiffany Coleman, female    DOB: 1956/05/05,  MRN: [id number]  Chief Complaint  Patient presents with   Diabetes    Pt stated that her feet have been swelling she stated that she has some numbness     68 y.o. female presents with the above complaint.  Patient presents with complaint of right first metatarsophalangeal joint pain.  Patient states is painful to touch is progressive gotten worse worse with ambulation worse with pressure she has tried different shoe gear modification has not seen and was prior to seeing me she would like to discuss more surgical options at this time she has tried other conservative modality has not helped including shoe gear modification padding offloading the first metatarsophalangeal joint.   Review of Systems: Negative except as noted in the HPI. Denies N/V/F/Ch.  Past Medical History:  Diagnosis Date   Anemia yrs ago   Arthritis    knees   Bronchitis    hx   CHF (congestive heart failure) (HCC)    Complication of anesthesia    trouble breathing when wake up needs breathing tx when wakes up   Hx of colonic polyps 2015   Obstructive sleep apnea 06/02/2016   No CPAP used   Recurrent ventral hernia 09/08/2013   Umbilical hernia     Current Outpatient Medications:    acetaminophen (TYLENOL) 325 MG tablet, Take 2 tablets (650 mg total) by mouth every 6 (six) hours as needed for mild pain or headache (fever >/= 101)., Disp: , Rfl:    albuterol (2.5 MG/3ML) 0.083% NEBU 3 mL, albuterol (5 MG/ML) 0.5% NEBU 0.5 mL, Inhale 5 mg into the lungs once a week., Disp: , Rfl:    albuterol (VENTOLIN HFA) 108 (90 Base) MCG/ACT inhaler, Inhale 2 puffs into the lungs every 6 (six) hours as needed for wheezing or shortness of breath., Disp: 1 each, Rfl: 2   azaTHIOprine (IMURAN) 50 MG tablet, TAKE 3 TABLETS(150 MG) BY MOUTH DAILY, Disp: 90 tablet, Rfl: 5   diclofenac Sodium (VOLTAREN) 1 % GEL, SMARTSIG:4 Gram(s) Topical 4 Times Daily PRN, Disp:  , Rfl:    fluticasone (FLONASE) 50 MCG/ACT nasal spray, Place 2 sprays into both nostrils daily. (Patient taking differently: Place 2 sprays into both nostrils daily as needed for rhinitis.), Disp: , Rfl: 2   furosemide (LASIX) 20 MG tablet, Take 1 tablet (20 mg total) by mouth 2 (two) times daily. NEEDS FOLLOW UP APPOINTMENT FOR MORE REFILLS, Disp: 90 tablet, Rfl: 0   hydrOXYzine (ATARAX) 25 MG tablet, Take 25 mg by mouth 2 (two) times daily as needed. for anxiety, Disp: , Rfl:    ibuprofen (ADVIL) 800 MG tablet, Take 1 tablet (800 mg total) by mouth 3 (three) times daily., Disp: 21 tablet, Rfl: 0   ipratropium-albuterol (DUONEB) 0.5-2.5 (3) MG/3ML SOLN, Take 3 mLs by nebulization every 6 (six) hours as needed., Disp: 360 mL, Rfl: 5   MOUNJARO 2.5 MG/0.5ML Pen, SMARTSIG:2.5 Milligram(s) SUB-Q Once a Week, Disp: , Rfl:    ondansetron (ZOFRAN-ODT) 4 MG disintegrating tablet, Take 1 tablet (4 mg total) by mouth every 8 (eight) hours as needed., Disp: 8 tablet, Rfl: 0   pantoprazole (PROTONIX) 40 MG tablet, Take 1 tablet (40 mg total) by mouth daily at 6 (six) AM., Disp: 30 tablet, Rfl: 2   potassium chloride (KLOR-CON M) 10 MEQ tablet, Take 1 tablet (10 mEq total) by mouth daily. Absolute last refill without office visit please call [phone] to schedule,  Disp: 30 tablet, Rfl: 0   predniSONE (DELTASONE) 2.5 MG tablet, Take 2.5mg  daily x's 2 weeks then 2.5mg  every other day x's 2 weeks (Patient not taking: Reported on 11/07/2023), Disp: 21 tablet, Rfl: 0   promethazine-dextromethorphan (PROMETHAZINE-DM) 6.25-15 MG/5ML syrup, Take 10 mLs by mouth 2 (two) times daily as needed for cough., Disp: , Rfl:    SYMBICORT 160-4.5 MCG/ACT inhaler, SMARTSIG:2 Puff(s) By Mouth Twice Daily, Disp: , Rfl:    zolpidem (AMBIEN) 10 MG tablet, Take 10 mg by mouth at bedtime as needed for sleep., Disp: , Rfl:   Social History   Tobacco Use  Smoking Status Former   Current packs/day: 0.00   Types: Cigarettes   Start  date: 05/26/2000   Quit date: 05/26/2020   Years since quitting: 3.4   Passive exposure: Past  Smokeless Tobacco Never    Allergies  Allergen Reactions   Latex Hives and Itching    Gloves with power   Other Itching    When take blood the material they put around arm causes itching   Tape Hives, Itching and Other (See Comments)    Coban wrap, in particular   Objective:  There were no vitals filed for this visit. There is no height or weight on file to calculate BMI. Constitutional Well developed. Well nourished.  Vascular Dorsalis pedis pulses palpable bilaterally. Posterior tibial pulses palpable bilaterally. Capillary refill normal to all digits.  No cyanosis or clubbing noted. Pedal hair growth normal.  Neurologic Normal speech. Oriented to person, place, and time. Epicritic sensation to light touch grossly present bilaterally.  Dermatologic Nails well groomed and normal in appearance. No open wounds. No skin lesions.  Orthopedic: Pain on palpation right first MTP pain with range of motion of the joint intra-articular first MPJ pain noted.  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  Osteophytes noted.  No other abnormalities identified.  Rest of the foot structure within normal limits posterior and plantar heel spurring noted Assessment:   1. Arthritis of first metatarsophalangeal (MTP) joint of right foot   2. Arthritis of left midfoot   3. Encounter for preoperative examination for general surgical procedure    Plan:  Patient was evaluated and treated and all questions answered.  Right first metatarsophalangeal joint arthritis severe with left dorsal midfoot arthritis - All questions and concerns were discussed with the patient in extensive  detail at this time I discussed my surgical options as well given the amount of arthritis that is present she will benefit from first metatarsophalangeal joint arthroplasty I discussed with the patient extensive detail she states understanding like to proceed with surgery despite my preoperative or postop plan with the patient in extensive detail she states understanding -Informed surgical risk consent was reviewed and read aloud to the patient.  I reviewed the films.  I have discussed my findings with the patient in great detail.  I have discussed all risks including but not limited to infection, stiffness, scarring, limp, disability, deformity, damage to blood vessels and nerves, numbness, poor healing, need for braces, arthritis, chronic pain, amputation, death.  All benefits and realistic expectations discussed in great detail.  I have made no promises as to the outcome.  I have provided realistic expectations.  I have offered the patient a 2nd opinion, which they  have declined and assured me they preferred to proceed despite the risks - Given the amount of pain that she is currently having she will benefit from steroid injection as well to the right first metatarsophalangeal joint and left dorsal midfoot -A steroid injection was performed at right first MTP and left dorsal midfoot using 1% plain Lidocaine and 10 mg of Kenalog. This was well tolerated.

## 2023-11-15 ENCOUNTER — Telehealth (HOSPITAL_COMMUNITY): Payer: Self-pay

## 2023-11-15 ENCOUNTER — Encounter (HOSPITAL_COMMUNITY): Admission: RE | Admit: 2023-11-15 | Payer: 59 | Source: Ambulatory Visit

## 2023-11-15 NOTE — Telephone Encounter (Signed)
 Patient called stating he won't be in the class due to swollen feet. She stated he had shots in her feet yesterday and was dealing with excessive swelling due to walking/exercising at home. She also wanted to remind [place] she will be getting surgery on her feet as soon as her rehab program is complete.

## 2023-11-16 ENCOUNTER — Telehealth: Payer: Self-pay | Admitting: Podiatry

## 2023-11-16 NOTE — Telephone Encounter (Signed)
 Pt called and seen that you chose her to have the surgery at [facility] and she is wondering why. She has no family in [place] and would like to have it in [place] if possible. She has would like to schedule it the 1st part of June. Please advise

## 2023-11-19 ENCOUNTER — Encounter: Payer: Self-pay | Admitting: Pulmonary Disease

## 2023-11-19 NOTE — Telephone Encounter (Signed)
 Called and spoke to the surgery center and they asked if we could go ahead and get the surgery scheduled and and send over the paperwork and she will have the nurse to review it to make sure she can have it at the surgery center. Pt does not want to go to [facility] but thought it was discussed to have it at the hospital due to pt being on oxygent 24 hrs a day. We have the date as of now on 5/19.25 at the [facility]. Please advise

## 2023-11-20 ENCOUNTER — Encounter (HOSPITAL_COMMUNITY)
Admission: RE | Admit: 2023-11-20 | Discharge: 2023-11-20 | Disposition: A | Discharge status: Home / Self Care | Payer: 59 | Source: Ambulatory Visit | Attending: Pulmonary Disease

## 2023-11-20 ENCOUNTER — Telehealth: Payer: Self-pay

## 2023-11-20 DIAGNOSIS — J849 Interstitial pulmonary disease, unspecified: Secondary | ICD-10-CM

## 2023-11-20 DIAGNOSIS — J984 Other disorders of lung: Secondary | ICD-10-CM

## 2023-11-20 NOTE — Telephone Encounter (Signed)
 Copied from CRM [id number]. Topic: Clinical - Lab/Test Results >> Nov 20, 2023 10:31 AM Konrad Dolores wrote: Reason for CRM: Ataya called requesting a call regarding her test results from her latest labs completed. She stated it showed abnormal, however she doesn't know exactly what that means. Please return the patient's call at [phone].   Spoke with [name], regarding Lab results from Dr. Isaiah Serge Labs are stable.  Continue current therapy. Pt verbalized understanding. NFN

## 2023-11-20 NOTE — Progress Notes (Signed)
 Daily Session Note  Patient Details  Name: Tiffany Coleman MRN: [id number] Date of Birth: [birth year] Referring Provider:   Doristine Devoid Pulmonary Rehab Walk Test from 09/28/2023 in [facility]  Referring Provider [name]       Encounter Date: 11/20/2023  Check In:  Session Check In - 11/20/23 1421       Check-In   Supervising physician immediately available to respond to emergencies [facility] MD immediately available    Physician(s) Rise Paganini, NP    Location [facility]    Staff Present Raford Pitcher, MS, ACSM-CEP, Exercise Physiologist;Randi Dionisio Paschal, ACSM-CEP, Exercise Physiologist;Catriona Dillenbeck Synthia Innocent, RN, BSN;Samantha Belarus, RD, LDN    Virtual Visit No    Medication changes reported     No    Fall or balance concerns reported    No    Tobacco Cessation No Change    Warm-up and Cool-down Performed as group-led instruction    Resistance Training Performed Yes    VAD Patient? No    PAD/SET Patient? No      Pain Assessment   Currently in Pain? No/denies    Multiple Pain Sites No             Capillary Blood Glucose: No results found for this or any previous visit (from the past 24 hours).    Social History   Tobacco Use  Smoking Status Former   Current packs/day: 0.00   Types: Cigarettes   Start date: 05/26/2000   Quit date: 05/26/2020   Years since quitting: 3.4   Passive exposure: Past  Smokeless Tobacco Never    Goals Met:  Proper associated with RPD/PD & O2 Sat Independence with exercise equipment Exercise tolerated well No report of concerns or symptoms today Strength training completed today  Goals Unmet:  Not Applicable  Comments: Service time is from 1305 to 1444.    Dr. Mechele Collin is Medical Director for Pulmonary Rehab at [facility].

## 2023-11-20 NOTE — Telephone Encounter (Unsigned)
 Copied from CRM [id number]. Topic: Clinical - Lab/Test Results >> Nov 20, 2023 10:31 AM Konrad Dolores wrote: Reason for CRM: Tiffany Coleman called requesting a call regarding her test results from her latest labs completed. She stated it showed abnormal, however she doesn't know exactly what that means. Please return the patient's call at [phone].

## 2023-11-21 ENCOUNTER — Telehealth: Payer: Self-pay | Admitting: *Deleted

## 2023-11-21 DIAGNOSIS — N289 Disorder of kidney and ureter, unspecified: Secondary | ICD-10-CM

## 2023-11-21 NOTE — Telephone Encounter (Signed)
 Notified pt that we were going to tentatively schedule it at the surgery center and per Aram Beecham they will have there anesthesiologist review the case and see if they are comfortable doing surgery at there facility. If not we will be able to switch it. Pt stated she needed it to be in [place] as she has no way to get to [facility] for surgery.

## 2023-11-21 NOTE — Telephone Encounter (Signed)
 Referral has been placed.

## 2023-11-21 NOTE — Telephone Encounter (Signed)
 Copied from CRM [id number]. Topic: Clinical - Lab/Test Results >> Nov 21, 2023  9:26 AM Orinda Kenner C wrote: Reason for CRM: Patient [phone] wants to speak with nurse on the test results showing her kidney levels are low and if she needs to see a kidney specialist. Also, wants a recommendation for cardiologist. Please advise and call back today per patient request.    I called and spoke with the pt  She is upset that she was told her labs were normal, but when she views them in Mychart she noticed it's considered abnormal  She is asking if she needs to see kidney specialist  She is also asking for a cards referral, bc she just wants to see one due to her age and being on o2   Dr Isaiah Serge, please advise, thanks!

## 2023-11-21 NOTE — Telephone Encounter (Signed)
 I called and discussed with patient.  GFR has some minimal changes She is however concerned as there is a strong family history of chronic kidney disease and is requesting an evaluation with nephrology We will hold off on cardiology referral as she does not have any cardiac symptoms  Please make a referral to nephrology for evaluation of kidney disease

## 2023-11-22 ENCOUNTER — Encounter (HOSPITAL_COMMUNITY)
Admission: RE | Admit: 2023-11-22 | Discharge: 2023-11-22 | Disposition: A | Discharge status: Home / Self Care | Payer: 59 | Source: Ambulatory Visit | Attending: Pulmonary Disease

## 2023-11-22 DIAGNOSIS — J984 Other disorders of lung: Secondary | ICD-10-CM

## 2023-11-22 DIAGNOSIS — J849 Interstitial pulmonary disease, unspecified: Secondary | ICD-10-CM | POA: Diagnosis not present

## 2023-11-22 NOTE — Progress Notes (Signed)
 Daily Session Note  Patient Details  Name: Tiffany Coleman MRN: [id number] Date of Birth: 25-Nov-1955 Referring Provider:   Doristine Devoid Pulmonary Rehab Walk Test from 09/28/2023 in [facility]  Referring Provider [name]       Encounter Date: 11/22/2023  Check In:  Session Check In - 11/22/23 1340       Check-In   Supervising physician immediately available to respond to emergencies [facility] MD immediately available    Physician(s) Robin Searing, NP    [facility]    Staff Present Raford Pitcher, MS, ACSM-CEP, Exercise Physiologist;Randi Dionisio Paschal, ACSM-CEP, Exercise Physiologist;Casey Synthia Innocent, RN, BSN;Samantha Belarus, RD, LDN    Virtual Visit No    Medication changes reported     No    Fall or balance concerns reported    No    Tobacco Cessation No Change    Warm-up and Cool-down Performed as group-led instruction    Resistance Training Performed Yes    VAD Patient? No    PAD/SET Patient? No      Pain Assessment   Currently in Pain? No/denies    Multiple Pain Sites No             Capillary Blood Glucose: No results found for this or any previous visit (from the past 24 hours).    Social History   Tobacco Use  Smoking Status Former   Current packs/day: 0.00   Types: Cigarettes   Start date: 05/26/2000   Quit date: 05/26/2020   Years since quitting: 3.4   Passive exposure: Past  Smokeless Tobacco Never    Goals Met:  Exercise tolerated well No report of concerns or symptoms today Strength training completed today  Goals Unmet:  Not Applicable  Comments: Service time is from 1308 to 1456    Dr. Mechele Collin is Medical Director for Pulmonary Rehab at [facility].

## 2023-11-23 ENCOUNTER — Telehealth: Payer: Self-pay | Admitting: Podiatry

## 2023-11-23 NOTE — Telephone Encounter (Signed)
 DOS: 12/31/23  KELLER BUNION IMPLANT (RT)    EFFECTIVE DATE: 08/15/23  DEDUCTIBLE:  $257.00  REMAINING: $0.00  OOP:  $9,350.00  REMAINING:  $9,024.63  CO INSURANCE : 30%  PER THE [facility] PROVIDER PORTAL NO PRIOR AUTH IS REQ FOR CPT CODE 13244  REF# [id number]

## 2023-11-26 ENCOUNTER — Ambulatory Visit (HOSPITAL_BASED_OUTPATIENT_CLINIC_OR_DEPARTMENT_OTHER)
Admission: RE | Admit: 2023-11-26 | Discharge: 2023-11-26 | Disposition: A | Discharge status: Home / Self Care | Source: Ambulatory Visit | Attending: Internal Medicine | Admitting: Internal Medicine

## 2023-11-26 ENCOUNTER — Encounter (HOSPITAL_BASED_OUTPATIENT_CLINIC_OR_DEPARTMENT_OTHER): Payer: Self-pay | Admitting: Radiology

## 2023-11-26 DIAGNOSIS — Z1231 Encounter for screening mammogram for malignant neoplasm of breast: Secondary | ICD-10-CM | POA: Insufficient documentation

## 2023-11-26 DIAGNOSIS — E2839 Other primary ovarian failure: Secondary | ICD-10-CM | POA: Insufficient documentation

## 2023-11-27 ENCOUNTER — Encounter (HOSPITAL_COMMUNITY)
Admission: RE | Admit: 2023-11-27 | Discharge: 2023-11-27 | Disposition: A | Discharge status: Home / Self Care | Payer: 59 | Source: Ambulatory Visit | Attending: Pulmonary Disease

## 2023-11-27 VITALS — Wt 174.4 lb

## 2023-11-27 DIAGNOSIS — J849 Interstitial pulmonary disease, unspecified: Secondary | ICD-10-CM | POA: Diagnosis not present

## 2023-11-27 DIAGNOSIS — J984 Other disorders of lung: Secondary | ICD-10-CM

## 2023-11-27 NOTE — Progress Notes (Signed)
 Daily Session Note  Patient Details  Name: Tiffany Coleman MRN: [id number] Date of Birth: May 30, 1956 Referring Provider:   Doristine Devoid Pulmonary Rehab Walk Test from 09/28/2023 in [facility]  Referring Provider [name]       Encounter Date: 11/27/2023  Check In:  Session Check In - 11/27/23 1326       Check-In   Supervising physician immediately available to respond to emergencies [facility] MD immediately available    Physician(s) Rise Paganini, NP    [facility]    Staff Present Raford Pitcher, MS, ACSM-CEP, Exercise Physiologist;Randi Dionisio Paschal, ACSM-CEP, Exercise Physiologist;Casey Synthia Innocent, RN, BSN    Virtual Visit No    Medication changes reported     No    Fall or balance concerns reported    No    Tobacco Cessation No Change    Warm-up and Cool-down Performed as group-led instruction    Resistance Training Performed Yes    VAD Patient? No    PAD/SET Patient? No      Pain Assessment   Currently in Pain? No/denies    Multiple Pain Sites No             Capillary Blood Glucose: No results found for this or any previous visit (from the past 24 hours).   Exercise Prescription Changes - 11/27/23 1500       Response to Exercise   Blood Pressure (Admit) 132/80    Blood Pressure (Exercise) 118/70    Blood Pressure (Exit) 122/74    Heart Rate (Admit) 80 bpm    Heart Rate (Exercise) 107 bpm    Heart Rate (Exit) 66 bpm    Oxygen Saturation (Admit) 97 %    Oxygen Saturation (Exercise) 94 %    Oxygen Saturation (Exit) 97 %    Rating of Perceived Exertion (Exercise) 13    Perceived Dyspnea (Exercise) 1    Duration Continue with 30 min of aerobic exercise without signs/symptoms of physical distress.    Intensity THRR unchanged      Progression   Progression Continue to progress workloads to maintain intensity without signs/symptoms of physical distress.       Resistance Training   Training Prescription Yes    Weight red bands    Reps 10-15    Time 10 Minutes      Oxygen   Oxygen Intermittent    Liters 0-2      Treadmill   MPH 1.4    Grade 0    Minutes 15    METs 2      Recumbant Elliptical   Level 3    Minutes 15    METs 4      Oxygen   Maintain Oxygen Saturation 88% or higher             Social History   Tobacco Use  Smoking Status Former   Current packs/day: 0.00   Types: Cigarettes   Start date: 05/26/2000   Quit date: 05/26/2020   Years since quitting: 3.5   Passive exposure: Past  Smokeless Tobacco Never    Goals Met:  Proper associated with RPD/PD & O2 Sat Exercise tolerated well No report of concerns or symptoms today Strength training completed today  Goals Unmet:  Not Applicable  Comments: Service time is from 1305 to 1435.    Dr. Mechele Collin is Medical Director for Pulmonary Rehab at [facility].

## 2023-11-28 NOTE — Progress Notes (Signed)
 Pulmonary Individual Treatment Plan  Patient Details  Name: Tiffany Coleman MRN: [id number] Date of Birth: [birth year] Referring Provider:   Doristine Devoid Pulmonary Rehab Walk Test from 09/28/2023 in [facility]  Referring Provider [name]       Initial Encounter Date:  Flowsheet Row Pulmonary Rehab Walk Test from 09/28/2023 in [facility]  Date 09/28/23       Visit Diagnosis: ILD (interstitial lung disease) (HCC)  Pneumonitis  Patient's Home Medications on Admission:   Current Outpatient Medications:    acetaminophen (TYLENOL) 325 MG tablet, Take 2 tablets (650 mg total) by mouth every 6 (six) hours as needed for mild pain or headache (fever >/= 101)., Disp: , Rfl:    albuterol (2.5 MG/3ML) 0.083% NEBU 3 mL, albuterol (5 MG/ML) 0.5% NEBU 0.5 mL, Inhale 5 mg into the lungs once a week., Disp: , Rfl:    albuterol (VENTOLIN HFA) 108 (90 Base) MCG/ACT inhaler, Inhale 2 puffs into the lungs every 6 (six) hours as needed for wheezing or shortness of breath., Disp: 1 each, Rfl: 2   azaTHIOprine (IMURAN) 50 MG tablet, TAKE 3 TABLETS(150 MG) BY MOUTH DAILY, Disp: 90 tablet, Rfl: 5   diclofenac Sodium (VOLTAREN) 1 % GEL, SMARTSIG:4 Gram(s) Topical 4 Times Daily PRN, Disp: , Rfl:    fluticasone (FLONASE) 50 MCG/ACT nasal spray, Place 2 sprays into both nostrils daily. (Patient taking differently: Place 2 sprays into both nostrils daily as needed for rhinitis.), Disp: , Rfl: 2   furosemide (LASIX) 20 MG tablet, Take 1 tablet (20 mg total) by mouth 2 (two) times daily. NEEDS FOLLOW UP APPOINTMENT FOR MORE REFILLS, Disp: 90 tablet, Rfl: 0   hydrOXYzine (ATARAX) 25 MG tablet, Take 25 mg by mouth 2 (two) times daily as needed. for anxiety, Disp: , Rfl:    ibuprofen (ADVIL) 800 MG tablet, Take 1 tablet (800 mg total) by mouth 3 (three) times daily., Disp: 21 tablet, Rfl: 0    ipratropium-albuterol (DUONEB) 0.5-2.5 (3) MG/3ML SOLN, Take 3 mLs by nebulization every 6 (six) hours as needed., Disp: 360 mL, Rfl: 5   MOUNJARO 2.5 MG/0.5ML Pen, SMARTSIG:2.5 Milligram(s) SUB-Q Once a Week, Disp: , Rfl:    ondansetron (ZOFRAN-ODT) 4 MG disintegrating tablet, Take 1 tablet (4 mg total) by mouth every 8 (eight) hours as needed., Disp: 8 tablet, Rfl: 0   pantoprazole (PROTONIX) 40 MG tablet, Take 1 tablet (40 mg total) by mouth daily at 6 (six) AM., Disp: 30 tablet, Rfl: 2   potassium chloride (KLOR-CON M) 10 MEQ tablet, Take 1 tablet (10 mEq total) by mouth daily. Absolute last refill without office visit please call [phone] to schedule, Disp: 30 tablet, Rfl: 0   predniSONE (DELTASONE) 2.5 MG tablet, Take 2.5mg  daily x's 2 weeks then 2.5mg  every other day x's 2 weeks (Patient not taking: Reported on 11/07/2023), Disp: 21 tablet, Rfl: 0   promethazine-dextromethorphan (PROMETHAZINE-DM) 6.25-15 MG/5ML syrup, Take 10 mLs by mouth 2 (two) times daily as needed for cough., Disp: , Rfl:    SYMBICORT 160-4.5 MCG/ACT inhaler, SMARTSIG:2 Puff(s) By Mouth Twice Daily, Disp: , Rfl:    zolpidem (AMBIEN) 10 MG tablet, Take 10 mg by mouth at bedtime as needed for sleep., Disp: , Rfl:   Past Medical History: Past Medical History:  Diagnosis Date   Anemia yrs ago   Arthritis    knees   Bronchitis  hx   CHF (congestive heart failure) (HCC)    Complication of anesthesia    trouble breathing when wake up needs breathing tx when wakes up   Hx of colonic polyps 2015   Obstructive sleep apnea 06/02/2016   No CPAP used   Recurrent ventral hernia 09/08/2013   Umbilical hernia     Tobacco Use: Social History   Tobacco Use  Smoking Status Former   Current packs/day: 0.00   Types: Cigarettes   Start date: 05/26/2000   Quit date: 05/26/2020   Years since quitting: 3.5   Passive exposure: Past  Smokeless Tobacco Never    Labs: Review Flowsheet  More data exists      Latest Ref  Rng & Units 04/05/2020 04/25/2020 05/20/2021 10/10/2021 10/11/2022  Labs for ITP Cardiac and Pulmonary Rehab  Cholestrol <200 mg/dL - - - 102  725   LDL (calc) mg/dL (calc) - - - 366  98   HDL-C > OR = 50 mg/dL - - - 69  58   Trlycerides <150 mg/dL 440  - - 83  347   Hemoglobin A1c 4.8 - 5.6 % - 6.7  - - -  Bicarbonate 20.0 - 28.0 mmol/L 26.4  - 32.6  - -  TCO2 22 - 32 mmol/L - - 34  32  33  - -  O2 Saturation % 38.5  - 74.0  - -    Details       Multiple values from one day are sorted in reverse-chronological order         Capillary Blood Glucose: Lab Results  Component Value Date   GLUCAP 90 10/16/2023   GLUCAP 101 (H) 10/16/2023   GLUCAP 101 (H) 10/11/2023   GLUCAP 100 (H) 10/11/2023   GLUCAP 90 10/04/2023     Pulmonary Assessment Scores:  Pulmonary Assessment Scores     Row Name 09/28/23 1120         ADL UCSD   ADL Phase Entry     SOB Score total 16       CAT Score   CAT Score 13       mMRC Score   mMRC Score 2             UCSD: Self-administered rating of dyspnea associated with activities of daily living (ADLs) 6-point scale (0 = "not at all" to 5 = "maximal or unable to do because of breathlessness")  Scoring Scores range from 0 to 120.  Minimally important difference is 5 units  CAT: CAT can identify the health impairment of COPD patients and is better correlated with disease progression.  CAT has a scoring range of zero to 40. The CAT score is classified into four groups of low (less than 10), medium (10 - 20), high (21-30) and very high (31-40) based on the impact level of disease on health status. A CAT score over 10 suggests significant symptoms.  A worsening CAT score could be explained by an exacerbation, poor medication adherence, poor inhaler technique, or progression of COPD or comorbid conditions.  CAT MCID is 2 points  mMRC: mMRC (Modified Medical Research Council) Dyspnea Scale is used to assess the degree of baseline functional  disability in patients of respiratory disease due to dyspnea. No minimal important difference is established. A decrease in score of 1 point or greater is considered a positive change.   Pulmonary Function Assessment:  Pulmonary Function Assessment - 09/28/23 1044       Breath  Bilateral Breath Sounds Clear    Shortness of Breath Yes;Limiting activity;Fear of Shortness of Breath             Exercise Target Goals: Exercise Program Goal: Individual exercise prescription set using results from initial 6 min walk test and THRR while considering  patient's activity barriers and safety.   Exercise Prescription Goal: Initial exercise prescription builds to 30-45 minutes a day of aerobic activity, 2-3 days per week.  Home exercise guidelines will be given to patient during program as part of exercise prescription that the participant will acknowledge.  Activity Barriers & Risk Stratification:   6 Minute Walk:  6 Minute Walk     Row Name 09/28/23 1130         6 Minute Walk   Phase Initial     Distance 1140 feet     Walk Time 6 minutes     # of Rest Breaks 0     MPH 2.16     METS 2.52     RPE 9     Perceived Dyspnea  1     VO2 Peak 8.81     Symptoms No     Resting HR 80 bpm     Resting BP 114/70     Resting Oxygen Saturation  100 %     Exercise Oxygen Saturation  during 6 min walk 92 %     Max Ex. HR 102 bpm     Max Ex. BP 140/64     2 Minute Post BP 130/70       Interval HR   1 Minute HR 89     2 Minute HR 101     3 Minute HR 98     4 Minute HR 102     5 Minute HR 100     6 Minute HR 101     2 Minute Post HR 75     Interval Heart Rate? Yes       Interval Oxygen   Interval Oxygen? Yes     Baseline Oxygen Saturation % 100 %     1 Minute Oxygen Saturation % 100 %     1 Minute Liters of Oxygen 2 L     2 Minute Oxygen Saturation % 97 %     2 Minute Liters of Oxygen 2 L     3 Minute Oxygen Saturation % 98 %     3 Minute Liters of Oxygen 2 L     4 Minute  Oxygen Saturation % 94 %     4 Minute Liters of Oxygen 2 L     5 Minute Oxygen Saturation % 93 %     5 Minute Liters of Oxygen 2 L     6 Minute Oxygen Saturation % 92 %     6 Minute Liters of Oxygen 2 L     2 Minute Post Oxygen Saturation % 100 %     2 Minute Post Liters of Oxygen 2 L              Oxygen Initial Assessment:  Oxygen Initial Assessment - 09/28/23 1044       Home Oxygen   Home Oxygen Device E-Tanks;Home Concentrator    Sleep Oxygen Prescription Continuous    Liters per minute 2    Home Exercise Oxygen Prescription Pulsed    Liters per minute 2    Home Resting Oxygen Prescription Pulsed    Liters per minute 2  Compliance with Home Oxygen Use Yes      Initial 6 min Walk   Oxygen Used Continuous    Liters per minute 2      Program Oxygen Prescription   Program Oxygen Prescription Continuous    Liters per minute 2      Intervention   Short Term Goals To learn and exhibit compliance with exercise, home and travel O2 prescription;To learn and understand importance of monitoring SPO2 with pulse oximeter and demonstrate accurate use of the pulse oximeter.;To learn and understand importance of maintaining oxygen saturations>88%;To learn and demonstrate proper use of respiratory medications;To learn and demonstrate proper pursed lip breathing techniques or other breathing techniques.     Long  Term Goals Exhibits compliance with exercise, home  and travel O2 prescription;Verbalizes importance of monitoring SPO2 with pulse oximeter and return demonstration;Maintenance of O2 saturations>88%;Exhibits proper breathing techniques, such as pursed lip breathing or other method taught during program session;Compliance with respiratory medication;Demonstrates proper use of MDI's             Oxygen Re-Evaluation:  Oxygen Re-Evaluation     Row Name 10/26/23 0926 11/19/23 1446           Program Oxygen Prescription   Program Oxygen Prescription Continuous Continuous       Liters per minute 2 0      Comments -- Pt is not requiring supplemental O2 for exercise        Home Oxygen   Home Oxygen Device E-Tanks;Home Concentrator E-Tanks;Home Concentrator      Sleep Oxygen Prescription Continuous Continuous      Liters per minute 2 2      Home Exercise Oxygen Prescription Pulsed Pulsed      Liters per minute 2 2      Home Resting Oxygen Prescription Pulsed Pulsed      Liters per minute 2 2      Compliance with Home Oxygen Use Yes Yes        Goals/Expected Outcomes   Short Term Goals To learn and exhibit compliance with exercise, home and travel O2 prescription;To learn and understand importance of monitoring SPO2 with pulse oximeter and demonstrate accurate use of the pulse oximeter.;To learn and understand importance of maintaining oxygen saturations>88%;To learn and demonstrate proper use of respiratory medications;To learn and demonstrate proper pursed lip breathing techniques or other breathing techniques.  To learn and exhibit compliance with exercise, home and travel O2 prescription;To learn and understand importance of monitoring SPO2 with pulse oximeter and demonstrate accurate use of the pulse oximeter.;To learn and understand importance of maintaining oxygen saturations>88%;To learn and demonstrate proper use of respiratory medications;To learn and demonstrate proper pursed lip breathing techniques or other breathing techniques.       Long  Term Goals Exhibits compliance with exercise, home  and travel O2 prescription;Verbalizes importance of monitoring SPO2 with pulse oximeter and return demonstration;Maintenance of O2 saturations>88%;Exhibits proper breathing techniques, such as pursed lip breathing or other method taught during program session;Compliance with respiratory medication;Demonstrates proper use of MDI's Exhibits compliance with exercise, home  and travel O2 prescription;Verbalizes importance of monitoring SPO2 with pulse oximeter and return  demonstration;Maintenance of O2 saturations>88%;Exhibits proper breathing techniques, such as pursed lip breathing or other method taught during program session;Compliance with respiratory medication;Demonstrates proper use of MDI's      Goals/Expected Outcomes Compliance and understanding of oxygen saturation monitoring and breathing techniques to decrease shortness of breath. Compliance and understanding of oxygen saturation monitoring and breathing techniques to  decrease shortness of breath.               Oxygen Discharge (Final Oxygen Re-Evaluation):  Oxygen Re-Evaluation - 11/19/23 1446       Program Oxygen Prescription   Program Oxygen Prescription Continuous    Liters per minute 0    Comments Pt is not requiring supplemental O2 for exercise      Home Oxygen   Home Oxygen Device E-Tanks;Home Concentrator    Sleep Oxygen Prescription Continuous    Liters per minute 2    Home Exercise Oxygen Prescription Pulsed    Liters per minute 2    Home Resting Oxygen Prescription Pulsed    Liters per minute 2    Compliance with Home Oxygen Use Yes      Goals/Expected Outcomes   Short Term Goals To learn and exhibit compliance with exercise, home and travel O2 prescription;To learn and understand importance of monitoring SPO2 with pulse oximeter and demonstrate accurate use of the pulse oximeter.;To learn and understand importance of maintaining oxygen saturations>88%;To learn and demonstrate proper use of respiratory medications;To learn and demonstrate proper pursed lip breathing techniques or other breathing techniques.     Long  Term Goals Exhibits compliance with exercise, home  and travel O2 prescription;Verbalizes importance of monitoring SPO2 with pulse oximeter and return demonstration;Maintenance of O2 saturations>88%;Exhibits proper breathing techniques, such as pursed lip breathing or other method taught during program session;Compliance with respiratory medication;Demonstrates  proper use of MDI's    Goals/Expected Outcomes Compliance and understanding of oxygen saturation monitoring and breathing techniques to decrease shortness of breath.             Initial Exercise Prescription:  Initial Exercise Prescription - 09/28/23 1200       Date of Initial Exercise RX and Referring Provider   Date 09/28/23    Referring Provider [name]    Expected Discharge Date 12/20/23      Oxygen   Oxygen Continuous    Liters 2    Maintain Oxygen Saturation 88% or higher      Recumbant Bike   Level 2    RPM 70    Minutes 15    METs 2      Recumbant Elliptical   Level 2    RPM 70    Minutes 15    METs 2      Prescription Details   Frequency (times per week) 2    Duration Progress to 30 minutes of continuous aerobic without signs/symptoms of physical distress      Intensity   THRR 40-80% of Max Heartrate 60-121    Ratings of Perceived Exertion 11-13    Perceived Dyspnea 0-4      Progression   Progression Continue to progress workloads to maintain intensity without signs/symptoms of physical distress.      Resistance Training   Training Prescription Yes    Weight red bands    Reps 10-15             Perform Capillary Blood Glucose checks as needed.  Exercise Prescription Changes:   Exercise Prescription Changes     Row Name 10/16/23 1500 10/25/23 1426 11/06/23 1500 11/13/23 1500 11/27/23 1500     Response to Exercise   Blood Pressure (Admit) 114/70 124/58 -- 118/68 132/80   Blood Pressure (Exercise) 152/80 -- -- 128/60 118/70   Blood Pressure (Exit) 120/60 100/60 -- 122/70 122/74   Heart Rate (Admit) 65 bpm 74 bpm -- 70 bpm 80 bpm  Heart Rate (Exercise) 91 bpm 120 bpm -- 107 bpm 107 bpm   Heart Rate (Exit) 65 bpm 72 bpm -- 85 bpm 66 bpm   Oxygen Saturation (Admit) 100 % 100 %  2l -- 100 %  RA 97 %   Oxygen Saturation (Exercise) 97 % 94 %  2L -- 92 %  RA 94 %   Oxygen Saturation (Exit) 100 % 100 %  2 -- 98 %  RA 97 %   Rating of Perceived  Exertion (Exercise) 15 15 -- 11 13   Perceived Dyspnea (Exercise) 1 1 -- 1 1   Duration Continue with 30 min of aerobic exercise without signs/symptoms of physical distress. Continue with 30 min of aerobic exercise without signs/symptoms of physical distress. -- Continue with 30 min of aerobic exercise without signs/symptoms of physical distress. Continue with 30 min of aerobic exercise without signs/symptoms of physical distress.   Intensity THRR unchanged THRR unchanged -- THRR unchanged THRR unchanged     Progression   Progression Continue to progress workloads to maintain intensity without signs/symptoms of physical distress. Continue to progress workloads to maintain intensity without signs/symptoms of physical distress. -- Continue to progress workloads to maintain intensity without signs/symptoms of physical distress. Continue to progress workloads to maintain intensity without signs/symptoms of physical distress.     Resistance Training   Training Prescription Yes Yes -- Yes Yes   Weight red bands red bands -- red bands red bands   Reps 10-15 10-15 -- 10-15 10-15   Time 10 Minutes 10 Minutes -- 10 Minutes 10 Minutes     Oxygen   Oxygen Continuous Continuous -- Intermittent Intermittent   Liters 2 2 -- 0-2 0-2     Treadmill   MPH -- -- -- -- 1.4   Grade -- -- -- -- 0   Minutes -- -- -- -- 15   METs -- -- -- -- 2     Recumbant Bike   Level 1 -- -- -- --   Minutes 15 -- -- -- --   METs 2.1 -- -- -- --     Recumbant Elliptical   Level 1 2 -- 3 3   RPM -- -- -- 70 --   Watts -- -- -- 84 --   Minutes 15 15 -- 15 15   METs 2.9 3 -- 4.2 4     Track   Laps -- 10 -- 13 --   Minutes -- 15 -- 15 --   METs -- 2.54 -- 3 --     Home Exercise Plan   Plans to continue exercise at -- -- Home (comment)  walking -- --   Frequency -- -- Add 1 additional day to program exercise sessions. -- --   Initial Home Exercises Provided -- -- 11/06/23 -- --     Oxygen   Maintain Oxygen  Saturation 88% or higher 88% or higher -- 88% or higher 88% or higher            Exercise Comments:   Exercise Comments     Row Name 10/04/23 1459 11/06/23 1507         Exercise Comments Pt completed her first day of group exercise. She exercised on the recumbent bike for 15 min, level 1, METs 1.8. She then exercised on the recumbent elliptical for 15 min, level 1, METs 3.8. She tolerated well but felt shaky afterwards. She did not complete cool down. Discussed METs with good reception. Discussed with pt home  exercise. She is currently not doing any aerobic exercise but is working on the warm up and cool down exercises. Discussed walking or dancing for 15 min 1-2 nonrehab days and building up to 30 min at a time. She agreed. She is eager to build up her tolerance and to get off oxygen.               Exercise Goals and Review:   Exercise Goals     Row Name 09/28/23 1042             Exercise Goals   Increase Physical Activity Yes       Intervention Provide advice, education, support and counseling about physical activity/exercise needs.;Develop an individualized exercise prescription for aerobic and resistive training based on initial evaluation findings, risk stratification, comorbidities and participant's personal goals.       Expected Outcomes Short Term: Attend rehab on a regular basis to increase amount of physical activity.;Long Term: Add in home exercise to make exercise part of routine and to increase amount of physical activity.;Long Term: Exercising regularly at least 3-5 days a week.       Increase Strength and Stamina Yes       Intervention Provide advice, education, support and counseling about physical activity/exercise needs.;Develop an individualized exercise prescription for aerobic and resistive training based on initial evaluation findings, risk stratification, comorbidities and participant's personal goals.       Expected Outcomes Short Term: Increase  workloads from initial exercise prescription for resistance, speed, and METs.;Short Term: Perform resistance training exercises routinely during rehab and add in resistance training at home;Long Term: Improve cardiorespiratory fitness, muscular endurance and strength as measured by increased METs and functional capacity ( )       Able to understand and use rate of perceived exertion (RPE) scale Yes       Intervention Provide education and explanation on how to use RPE scale       Expected Outcomes Short Term: Able to use RPE daily in rehab to express subjective intensity level;Long Term:  Able to use RPE to guide intensity level when exercising independently       Able to understand and use Dyspnea scale Yes       Intervention Provide education and explanation on how to use Dyspnea scale       Expected Outcomes Short Term: Able to use Dyspnea scale daily in rehab to express subjective sense of shortness of breath during exertion;Long Term: Able to use Dyspnea scale to guide intensity level when exercising independently       Knowledge and understanding of Target Heart Rate Range (THRR) Yes       Intervention Provide education and explanation of THRR including how the numbers were predicted and where they are located for reference       Expected Outcomes Long Term: Able to use THRR to govern intensity when exercising independently;Short Term: Able to state/look up THRR;Short Term: Able to use daily as guideline for intensity in rehab       Understanding of Exercise Prescription Yes       Intervention Provide education, explanation, and written materials on patient's individual exercise prescription       Expected Outcomes Short Term: Able to explain program exercise prescription;Long Term: Able to explain home exercise prescription to exercise independently                Exercise Goals Re-Evaluation :  Exercise Goals Re-Evaluation     Row Name 09/28/23  1043 10/26/23 0922 11/19/23 1443          Exercise Goal Re-Evaluation   Exercise Goals Review Increase Physical Activity;Able to understand and use Dyspnea scale;Understanding of Exercise Prescription;Increase Strength and Stamina;Knowledge and understanding of Target Heart Rate Range (THRR);Able to understand and use rate of perceived exertion (RPE) scale Increase Physical Activity;Able to understand and use Dyspnea scale;Understanding of Exercise Prescription;Increase Strength and Stamina;Knowledge and understanding of Target Heart Rate Range (THRR);Able to understand and use rate of perceived exertion (RPE) scale Increase Physical Activity;Able to understand and use Dyspnea scale;Understanding of Exercise Prescription;Increase Strength and Stamina;Knowledge and understanding of Target Heart Rate Range (THRR);Able to understand and use rate of perceived exertion (RPE) scale     Comments Jilian is scheduled to exercise again on 2/18. Will continue to monitor and progress as able. Brynley has completed 6 exercise sessions, missing one session. She was exercising on the recumbent bike for 15 min, level 2, METs 2.3. However her knee was hurting therefore changed to walking the track last session with decreased pain and increased enjoyment. She walked 15 min, METs 2.54. She then is exercising on the recumbent elliptical for 15 min, level 2, METs 3.0. She is beginning to progress and is becoming more motivated. She performs warmup and cool down standing, working on squats. Using red bands, 3.7 lbs. Will encouraged blue bands, 5.8 lbs. Will continue to monitor and progress as able. Ronee has completed 10 exercise sessions, missing two session. She is walking 15 min, METs 3. She then is exercising on the recumbent elliptical for 15 min, level 3, METs 4.2. She is beginning to progress and is becoming more motivated. She performs warmup and cool down standing, working on squats. Using blue bands, 5.8 lbs. Will continue to monitor and progress as able.      Expected Outcomes Through exercise at rehab and home the patient will decrease shortness of breath with daily activities and feel confident in carrying out and exercise regimn at home. Through exercise at rehab and home the patient will decrease shortness of breath with daily activities and feel confident in carrying out and exercise regimn at home. Through exercise at rehab and home the patient will decrease shortness of breath with daily activities and feel confident in carrying out and exercise regimn at home.              Discharge Exercise Prescription (Final Exercise Prescription Changes):  Exercise Prescription Changes - 11/27/23 1500       Response to Exercise   Blood Pressure (Admit) 132/80    Blood Pressure (Exercise) 118/70    Blood Pressure (Exit) 122/74    Heart Rate (Admit) 80 bpm    Heart Rate (Exercise) 107 bpm    Heart Rate (Exit) 66 bpm    Oxygen Saturation (Admit) 97 %    Oxygen Saturation (Exercise) 94 %    Oxygen Saturation (Exit) 97 %    Rating of Perceived Exertion (Exercise) 13    Perceived Dyspnea (Exercise) 1    Duration Continue with 30 min of aerobic exercise without signs/symptoms of physical distress.    Intensity THRR unchanged      Progression   Progression Continue to progress workloads to maintain intensity without signs/symptoms of physical distress.      Resistance Training   Training Prescription Yes    Weight red bands    Reps 10-15    Time 10 Minutes      Oxygen   Oxygen Intermittent  Liters 0-2      Treadmill   MPH 1.4    Grade 0    Minutes 15    METs 2      Recumbant Elliptical   Level 3    Minutes 15    METs 4      Oxygen   Maintain Oxygen Saturation 88% or higher             Nutrition:  Target Goals: Understanding of nutrition guidelines, daily intake of sodium 1500mg , cholesterol 200mg , calories 30% from fat and 7% or less from saturated fats, daily to have 5 or more servings of fruits and  vegetables.  Biometrics:  Pre Biometrics - 09/28/23 1215       Pre Biometrics   Grip Strength 21 kg              Nutrition Therapy Plan and Nutrition Goals:  Nutrition Therapy & Goals - 11/26/23 1044       Nutrition Therapy   Diet Heart Healthy Diet      Personal Nutrition Goals   Nutrition Goal Patient to improve diet quality by using the plate method as a guide for meal planning to include lean protein/plant protein, fruits, vegetables, whole grains, nonfat dairy as part of a well-balanced diet.    Comments Goal in progress. Kellis has medical hisotry of pneumonitis, ILD, HTN, OSA. She is down 1.5# since starting with our program. Answered patient questions regarding sugar intake and strategies for reducing added sugar. Patient will continue to benefit from participation in pulmonary rehab for nutrition, exercise, and lifestyle modification support.      Intervention Plan   Intervention Prescribe, educate and counsel regarding individualized specific dietary modifications aiming towards targeted core components such as weight, hypertension, lipid management, diabetes, heart failure and other comorbidities.;Nutrition handout(s) given to patient.    Expected Outcomes Short Term Goal: Understand basic principles of dietary content, such as calories, fat, sodium, cholesterol and nutrients.;Long Term Goal: Adherence to prescribed nutrition plan.             Nutrition Assessments:  MEDIFICTS Score Key: >=70 Need to make dietary changes  40-70 Heart Healthy Diet <= 40 Therapeutic Level Cholesterol Diet  Flowsheet Row PULMONARY REHAB OTHER RESPIRATORY from 10/14/2020 in [facility]  Picture Your Plate Total Score on Admission 55      Picture Your Plate Scores: <16 Unhealthy dietary pattern with much room for improvement. 41-50 Dietary pattern unlikely to meet recommendations for good health and room for  improvement. 51-60 More healthful dietary pattern, with some room for improvement.  >60 Healthy dietary pattern, although there may be some specific behaviors that could be improved.    Nutrition Goals Re-Evaluation:  Nutrition Goals Re-Evaluation     Row Name 10/26/23 1040 11/26/23 1044           Goals   Current Weight 174 lb 2.6 oz (79 kg) 172 lb 9.9 oz (78.3 kg)      Comment Lipids WNL, LDL 98 Lipids WNL, LDL 98, hemoglobin 11.1, GFR 50      Expected Outcome Patient will benefit from participation in intensive cardiac rehab for nutrition, exercise, and lifestyle modification. Goal in progress. Viola has medical hisotry of pneumonitis, ILD, HTN, OSA. She is down 1.5# since starting with our program. Answered patient questions regarding sugar intake and strategies for reducing added sugar. Patient will continue to benefit from participation in pulmonary rehab for nutrition, exercise,  and lifestyle modification support.               Nutrition Goals Discharge (Final Nutrition Goals Re-Evaluation):  Nutrition Goals Re-Evaluation - 11/26/23 1044       Goals   Current Weight 172 lb 9.9 oz (78.3 kg)    Comment Lipids WNL, LDL 98, hemoglobin 11.1, GFR 50    Expected Outcome Goal in progress. Dazha has medical hisotry of pneumonitis, ILD, HTN, OSA. She is down 1.5# since starting with our program. Answered patient questions regarding sugar intake and strategies for reducing added sugar. Patient will continue to benefit from participation in pulmonary rehab for nutrition, exercise, and lifestyle modification support.             Psychosocial: Target Goals: Acknowledge presence or absence of significant depression and/or stress, maximize coping skills, provide positive support system. Participant is able to verbalize types and ability to use techniques and skills needed for reducing stress and depression.  Initial Review & Psychosocial Screening:  Initial Psych Review &  Screening - 09/28/23 1045       Initial Review   Current issues with None Identified    Comments Pt denies any psychosocial barriers at this time.      Family Dynamics   Good Support System? Yes    Comments Pt states she is in contact with children      Screening Interventions   Interventions Encouraged to exercise             Quality of Life Scores:  Scores of 19 and below usually indicate a poorer quality of life in these areas.  A difference of  2-3 points is a clinically meaningful difference.  A difference of 2-3 points in the total score of the Quality of Life Index has been associated with significant improvement in overall quality of life, self-image, physical symptoms, and general health in studies assessing change in quality of life.  PHQ-9: Review Flowsheet  More data exists      09/28/2023 08/11/2022 11/23/2020 10/01/2020 11/02/2017  Depression screen PHQ 2/9  Decreased Interest 0 0 1 0 0  Down, Depressed, Hopeless 1 0 0 2 0  PHQ - 2 Score 1 0 1 2 0  Altered sleeping 1 0 0 2 -  Tired, decreased energy 1 3 1 3  -  Change in appetite 0 0 0 0 -  Feeling bad or failure about yourself  0 0 0 0 -  Trouble concentrating 0 0 0 0 -  Moving slowly or fidgety/restless 0 0 0 0 -  Suicidal thoughts 0 0 0 0 -  PHQ-9 Score 3 3 2 7  -  Difficult doing work/chores Not difficult at all Not difficult at all Somewhat difficult Somewhat difficult -   Interpretation of Total Score  Total Score Depression Severity:  1-4 = Minimal depression, 5-9 = Mild depression, 10-14 = Moderate depression, 15-19 = Moderately severe depression, 20-27 = Severe depression   Psychosocial Evaluation and Intervention:  Psychosocial Evaluation - 09/28/23 1047       Psychosocial Evaluation & Interventions   Interventions Encouraged to exercise with the program and follow exercise prescription    Comments Pt denies any psychosocial barriers at this time.    Expected Outcomes For Kalise to participate  in rehab free of psychosocial barriers.    Continue Psychosocial Services  No Follow up required             Psychosocial Re-Evaluation:  Psychosocial Re-Evaluation  Row Name 10/03/23 1020 10/24/23 1334 11/23/23 0815         Psychosocial Re-Evaluation   Current issues with -- None Identified None Identified     Comments Tyriana was scheduled to start the program on 10/02/23, but did not show up. No new barriers or concerns since orientation on 09/28/23. Payson denies any new psychosocial barriers or concerns at this time. Psychosocial monthly re-evaluation is as follows: Annelise is excited about her 68th birthday coming up. Her family is throwing her a large birthday party. She is excited to get dressed up and be the "queen" of the party. She is also excited that she has been weaned off her oxygen to room air. Gennesis denies any psy/soc barriers or concerns.     Expected Outcomes For Christabelle to participate in PR free of any psychosocial barriers or concerns. For Bentlie to participate in PR free of any psychosocial barriers or concerns. For Aasia to participate in PR free of any psychosocial barriers or concerns.     Interventions -- Encouraged to attend Pulmonary Rehabilitation for the exercise Encouraged to attend Pulmonary Rehabilitation for the exercise     Continue Psychosocial Services  No Follow up required No Follow up required No Follow up required              Psychosocial Discharge (Final Psychosocial Re-Evaluation):  Psychosocial Re-Evaluation - 11/23/23 0815       Psychosocial Re-Evaluation   Current issues with None Identified    Comments Psychosocial monthly re-evaluation is as follows: Cecillia is excited about her 68th birthday coming up. Her family is throwing her a large birthday party. She is excited to get dressed up and be the "queen" of the party. She is also excited that she has been weaned off her oxygen to room air. Symiah denies any psy/soc  barriers or concerns.    Expected Outcomes For Kimbrely to participate in PR free of any psychosocial barriers or concerns.    Interventions Encouraged to attend Pulmonary Rehabilitation for the exercise    Continue Psychosocial Services  No Follow up required             Education: Education Goals: Education classes will be provided on a weekly basis, covering required topics. Participant will state understanding/return demonstration of topics presented.  Learning Barriers/Preferences:   Education Topics: Know Your Numbers Group instruction that is supported by a PowerPoint presentation. Instructor discusses importance of knowing and understanding resting, exercise, and post-exercise oxygen saturation, heart rate, and blood pressure. Oxygen saturation, heart rate, blood pressure, rating of perceived exertion, and dyspnea are reviewed along with a normal range for these values.    Exercise for the Pulmonary Patient Group instruction that is supported by a PowerPoint presentation. Instructor discusses benefits of exercise, core components of exercise, frequency, duration, and intensity of an exercise routine, importance of utilizing pulse oximetry during exercise, safety while exercising, and options of places to exercise outside of rehab.    MET Level  Group instruction provided by PowerPoint, verbal discussion, and written material to support subject matter. Instructor reviews what METs are and how to increase METs.  Flowsheet Row PULMONARY REHAB OTHER RESPIRATORY from 10/04/2023 in [facility] for Heart, Vascular, & Lung Health  Date 10/04/23  Educator EP  Instruction Review Code 1- Verbalizes Understanding       Pulmonary Medications Verbally interactive group education provided by instructor with focus on inhaled medications and proper administration. Flowsheet Row PULMONARY REHAB OTHER RESPIRATORY from  11/01/2023 in [facility]  for Heart, Vascular, & Lung Health  Date 11/01/23  Educator RT  Instruction Review Code 1- Verbalizes Understanding       Anatomy and Physiology of the Respiratory System Group instruction provided by PowerPoint, verbal discussion, and written material to support subject matter. Instructor reviews respiratory cycle and anatomical components of the respiratory system and their functions. Instructor also reviews differences in obstructive and restrictive respiratory diseases with examples of each.  Flowsheet Row PULMONARY REHAB OTHER RESPIRATORY from 10/18/2023 in [facility] for Heart, Vascular, & Lung Health  Date 10/18/23  Educator RT  Instruction Review Code 1- Verbalizes Understanding       Oxygen Safety Group instruction provided by PowerPoint, verbal discussion, and written material to support subject matter. There is an overview of "What is Oxygen" and "Why do we need it".  Instructor also reviews how to create a safe environment for oxygen use, the importance of using oxygen as prescribed, and the risks of noncompliance. There is a brief discussion on traveling with oxygen and resources the patient may utilize. Flowsheet Row PULMONARY REHAB OTHER RESPIRATORY from 11/22/2023 in [facility] for Heart, Vascular, & Lung Health  Date 11/22/23  Educator EP  Instruction Review Code 1- Verbalizes Understanding       Oxygen Use Group instruction provided by PowerPoint, verbal discussion, and written material to discuss how supplemental oxygen is prescribed and different types of oxygen supply systems. Resources for more information are provided.    Breathing Techniques Group instruction that is supported by demonstration and informational handouts. Instructor discusses the benefits of pursed lip and diaphragmatic breathing and detailed demonstration on how to perform both.     Risk Factor Reduction Group instruction that is supported by  a PowerPoint presentation. Instructor discusses the definition of a risk factor, different risk factors for pulmonary disease, and how the heart and lungs work together.   Pulmonary Diseases Group instruction provided by PowerPoint, verbal discussion, and written material to support subject matter. Instructor gives an overview of the different type of pulmonary diseases. There is also a discussion on risk factors and symptoms as well as ways to manage the diseases. Flowsheet Row PULMONARY REHAB OTHER RESPIRATORY from 10/11/2023 in [facility] for Heart, Vascular, & Lung Health  Date 10/11/23  Educator RT  Instruction Review Code 1- Verbalizes Understanding       Stress and Energy Conservation Group instruction provided by PowerPoint, verbal discussion, and written material to support subject matter. Instructor gives an overview of stress and the impact it can have on the body. Instructor also reviews ways to reduce stress. There is also a discussion on energy conservation and ways to conserve energy throughout the day.   Warning Signs and Symptoms Group instruction provided by PowerPoint, verbal discussion, and written material to support subject matter. Instructor reviews warning signs and symptoms of stroke, heart attack, cold and flu. Instructor also reviews ways to prevent the spread of infection.   Other Education Group or individual verbal, written, or video instructions that support the educational goals of the pulmonary rehab program. Flowsheet Row PULMONARY REHAB OTHER RESPIRATORY from 10/25/2023 in [facility] for Heart, Vascular, & Lung Health  Date 10/25/23  Educator EP  Instruction Review Code 1- Verbalizes Understanding        Knowledge Questionnaire Score:  Knowledge Questionnaire Score - 09/28/23 1118       Knowledge Questionnaire  Score   Pre Score 13/18             Core Components/Risk Factors/Patient Goals at  Admission:  Personal Goals and Risk Factors at Admission - 09/28/23 1045       Core Components/Risk Factors/Patient Goals on Admission   Improve shortness of breath with ADL's Yes    Intervention Provide education, individualized exercise plan and daily activity instruction to help decrease symptoms of SOB with activities of daily living.    Expected Outcomes Short Term: Improve cardiorespiratory fitness to achieve a reduction of symptoms when performing ADLs;Long Term: Be able to perform more ADLs without symptoms or delay the onset of symptoms             Core Components/Risk Factors/Patient Goals Review:   Goals and Risk Factor Review     Row Name 10/03/23 1023 10/24/23 1335 11/23/23 0859         Core Components/Risk Factors/Patient Goals Review   Personal Goals Review Improve shortness of breath with ADL's;Develop more efficient breathing techniques such as purse lipped breathing and diaphragmatic breathing and practicing self-pacing with activity. Improve shortness of breath with ADL's;Develop more efficient breathing techniques such as purse lipped breathing and diaphragmatic breathing and practicing self-pacing with activity. Improve shortness of breath with ADL's;Develop more efficient breathing techniques such as purse lipped breathing and diaphragmatic breathing and practicing self-pacing with activity.     Review Unable to assess Beatrices goals at this time. She was scheduled to start on 10/02/23, but was unable to attend. Goal progressing for improving shortness of breath. Kawanda is currently using 2L O2  while exercising to keep sats >88%. Goal progressing for developing more efficient breathing techniques such as purse lipped breathing and diaphragmatic breathing; and practicing self-pacing with activity. Ameia is currently exercising on the recumbant elliptical and the recumbant bike. We will continue to monitor her progress throughout the program. Monthly review of  patient's Core Components/Risk Factors/Patient Goals are as follows: Goal progressing for improving shortness of breath. Evoleht has been weaned off he oxygen while exercising to room air. Zuma is currently exercising on the recumbent elliptical and the recumbent bike. She has been able to increase her METs and workload while maintaining her oxygen level >88%. Goal met for developing more efficient breathing techniques such as purse lipped breathing and diaphragmatic breathing; and practicing self-pacing with activity. Tricie can initiate PLB independently. She knows how to slow down her pace or stop when dyspneic. She is practicing diaphragmatic breathing at home, before bed. We will continue to monitor her goals progress throughout the program.     Expected Outcomes To improve shortness of breath with ADL's and develop more efficient breathing techniques such as purse lipped breathing and diaphragmatic breathing; and practicing self-pacing with activity To improve shortness of breath with ADL's and develop more efficient breathing techniques such as purse lipped breathing and diaphragmatic breathing; and practicing self-pacing with activity To improve shortness of breath with ADL's              Core Components/Risk Factors/Patient Goals at Discharge (Final Review):   Goals and Risk Factor Review - 11/23/23 0859       Core Components/Risk Factors/Patient Goals Review   Personal Goals Review Improve shortness of breath with ADL's;Develop more efficient breathing techniques such as purse lipped breathing and diaphragmatic breathing and practicing self-pacing with activity.    Review Monthly review of patient's Core Components/Risk Factors/Patient Goals are as follows: Goal progressing for improving shortness of breath.  Dejae has been weaned off he oxygen while exercising to room air. Gloria is currently exercising on the recumbent elliptical and the recumbent bike. She has been able to  increase her METs and workload while maintaining her oxygen level >88%. Goal met for developing more efficient breathing techniques such as purse lipped breathing and diaphragmatic breathing; and practicing self-pacing with activity. Tricie can initiate PLB independently. She knows how to slow down her pace or stop when dyspneic. She is practicing diaphragmatic breathing at home, before bed. We will continue to monitor her goals progress throughout the program.    Expected Outcomes To improve shortness of breath with ADL's             ITP Comments:Pt is making expected progress toward Pulmonary Rehab goals after completing 13 session(s). Recommend continued exercise, life style modification, education, and utilization of breathing techniques to increase stamina and strength, while also decreasing shortness of breath with exertion.  Dr. Genetta Kenning is Medical Director for Pulmonary Rehab at [facility].

## 2023-11-29 ENCOUNTER — Encounter (HOSPITAL_COMMUNITY): Payer: 59

## 2023-12-04 ENCOUNTER — Encounter (HOSPITAL_COMMUNITY)
Admission: RE | Admit: 2023-12-04 | Discharge: 2023-12-04 | Disposition: A | Discharge status: Home / Self Care | Payer: 59 | Source: Ambulatory Visit | Attending: Pulmonary Disease | Admitting: Pulmonary Disease

## 2023-12-04 DIAGNOSIS — J849 Interstitial pulmonary disease, unspecified: Secondary | ICD-10-CM

## 2023-12-04 DIAGNOSIS — J984 Other disorders of lung: Secondary | ICD-10-CM

## 2023-12-04 NOTE — Progress Notes (Signed)
 Daily Session Note  Patient Details  Name: Tiffany Coleman MRN: [id number] Date of Birth: [birth year] Referring Provider:   Gattis Kass Pulmonary Rehab Walk Test from 09/28/2023 in [facility] for Heart, Vascular, & Lung Health  Referring Provider [name]       Encounter Date: 12/04/2023  Check In:  Session Check In - 12/04/23 1438       Check-In   Supervising physician immediately available to respond to emergencies [facility] MD immediately available    Physician(s) Marlana Silvan, NP    Location [facility]    Staff Present Atlas Lea, MS, ACSM-CEP, Exercise Physiologist;Randi Rochelle Chu, ACSM-CEP, Exercise Physiologist;Shuntay Everetts Ena Harries, MS, Exercise Physiologist    Virtual Visit No    Medication changes reported     No    Fall or balance concerns reported    No    Tobacco Cessation No Change    Warm-up and Cool-down Performed as group-led instruction    Resistance Training Performed Yes    VAD Patient? No    PAD/SET Patient? No      Pain Assessment   Currently in Pain? No/denies    Multiple Pain Sites No             Capillary Blood Glucose: No results found for this or any previous visit (from the past 24 hours).    Social History   Tobacco Use  Smoking Status Former   Current packs/day: 0.00   Types: Cigarettes   Start date: 05/26/2000   Quit date: 05/26/2020   Years since quitting: 3.5   Passive exposure: Past  Smokeless Tobacco Never    Goals Met:  Proper associated with RPD/PD & O2 Sat Independence with exercise equipment Exercise tolerated well No report of concerns or symptoms today Strength training completed today  Goals Unmet:  Not Applicable  Comments: Service time is from 1313 to 1443.    Dr. Genetta Kenning is Medical Director for Pulmonary Rehab at [facility].

## 2023-12-06 ENCOUNTER — Encounter (HOSPITAL_COMMUNITY): Admission: RE | Admit: 2023-12-06 | Payer: 59 | Source: Ambulatory Visit

## 2023-12-06 ENCOUNTER — Telehealth (HOSPITAL_COMMUNITY): Payer: Self-pay

## 2023-12-06 NOTE — Telephone Encounter (Signed)
 Received VM that pt called out for illness.

## 2023-12-10 ENCOUNTER — Telehealth: Payer: Self-pay

## 2023-12-10 NOTE — Telephone Encounter (Signed)
 Copied from CRM [id number]. Topic: Referral - Question >> Dec 10, 2023 10:11 AM Tiffany Coleman wrote: Reason for CRM: pt states she received a call from [facility] medical.  pt sees Dr Waylan Haggard, and does not understand why she got this call.  Tiffany Coleman states it is for a pulmonary check up      Spoke w/ PT  explained we are not part of that clinic so we would not be able to tell her, she needs to call them about it.

## 2023-12-11 ENCOUNTER — Encounter (HOSPITAL_COMMUNITY)
Admission: RE | Admit: 2023-12-11 | Discharge: 2023-12-11 | Disposition: A | Discharge status: Home / Self Care | Payer: 59 | Source: Ambulatory Visit | Attending: Pulmonary Disease

## 2023-12-11 VITALS — Wt 173.7 lb

## 2023-12-11 DIAGNOSIS — J849 Interstitial pulmonary disease, unspecified: Secondary | ICD-10-CM

## 2023-12-11 DIAGNOSIS — J984 Other disorders of lung: Secondary | ICD-10-CM

## 2023-12-11 NOTE — Progress Notes (Signed)
 Daily Session Note  Patient Details  Name: Tiffany Coleman MRN: [id number] Date of Birth: [birth year] Referring Provider:   Gattis Kass Pulmonary Rehab Walk Test from 09/28/2023 in [facility] for Heart, Vascular, & Lung Health  Referring Provider [name]       Encounter Date: 12/11/2023  Check In:  Session Check In - 12/11/23 1430       Check-In   Supervising physician immediately available to respond to emergencies [facility] MD immediately available    Physician(s) Lawana Pray, NP    Location [facility]    Staff Present Atlas Lea, MS, ACSM-CEP, Exercise Physiologist;Randi Rochelle Chu, ACSM-CEP, Exercise Physiologist;Sholonda Jobst Ena Harries, MS, Exercise Physiologist    Virtual Visit No    Medication changes reported     No    Fall or balance concerns reported    No    Tobacco Cessation No Change    Warm-up and Cool-down Performed as group-led instruction    Resistance Training Performed Yes    VAD Patient? No    PAD/SET Patient? No      Pain Assessment   Currently in Pain? No/denies    Multiple Pain Sites No             Capillary Blood Glucose: No results found for this or any previous visit (from the past 24 hours).   Exercise Prescription Changes - 12/11/23 1500       Response to Exercise   Blood Pressure (Admit) 124/60    Blood Pressure (Exit) 102/66    Heart Rate (Admit) 69 bpm    Heart Rate (Exercise) 100 bpm    Heart Rate (Exit) 73 bpm    Oxygen Saturation (Admit) 98 %    Oxygen Saturation (Exercise) 92 %    Oxygen Saturation (Exit) 98 %    Rating of Perceived Exertion (Exercise) 13    Perceived Dyspnea (Exercise) 0    Duration Continue with 30 min of aerobic exercise without signs/symptoms of physical distress.    Intensity THRR unchanged      Progression   Progression Continue to progress workloads to maintain intensity without signs/symptoms of physical distress.      Resistance Training    Training Prescription Yes    Weight blue bands    Reps 10-15    Time 10 Minutes      Oxygen   Oxygen Intermittent    Liters 0-2      Recumbant Elliptical   Level 3    Minutes 15    METs 3.7      Track   Laps 12    Minutes 15    METs 2.85      Oxygen   Maintain Oxygen Saturation 88% or higher             Social History   Tobacco Use  Smoking Status Former   Current packs/day: 0.00   Types: Cigarettes   Start date: 05/26/2000   Quit date: 05/26/2020   Years since quitting: 3.5   Passive exposure: Past  Smokeless Tobacco Never    Goals Met:  Proper associated with RPD/PD & O2 Sat Independence with exercise equipment Exercise tolerated well No report of concerns or symptoms today Strength training completed today  Goals Unmet:  Not Applicable  Comments: Service time is from 1310 to 1441.    Dr. Genetta Kenning is Medical Director for Pulmonary Rehab at [facility].

## 2023-12-13 ENCOUNTER — Telehealth (HOSPITAL_COMMUNITY): Payer: Self-pay

## 2023-12-13 ENCOUNTER — Encounter (HOSPITAL_COMMUNITY): Admission: RE | Admit: 2023-12-13 | Payer: 59 | Source: Ambulatory Visit

## 2023-12-13 NOTE — Telephone Encounter (Signed)
 pt stated that she is having back pain and she will not be in today.

## 2023-12-14 ENCOUNTER — Telehealth: Payer: Self-pay | Admitting: Podiatry

## 2023-12-14 NOTE — Telephone Encounter (Signed)
 Called pts cell and vm is full, called pts home and pt answered and I explained that the surgery center where Dr Lydia Sams was going to do the surgery is not comfortable with doing the surgery due to her health conditions. They recommend at the hospital. We were originally going to do it at the hospital but pt did not want to go to [facility].  I explained that is the only hospital that Dr Lydia Sams does surgeries at and the only other option would be to see a different provider that can do it at a hospital in [place]. She chose to stay with Dr Lydia Sams and I am trying to get her scheduled for 5/12 at [facility].

## 2023-12-14 NOTE — Telephone Encounter (Signed)
 Pt scheduled at [facility] for 5/12 and is aware. Est time of surgery is 200pm. Case # [id number].  Pt aware needs to stop the mounjaro.

## 2023-12-14 NOTE — Telephone Encounter (Signed)
 Notified surgery center to cxl surgery that was scheduled there.

## 2023-12-18 ENCOUNTER — Telehealth: Payer: Self-pay | Admitting: Podiatry

## 2023-12-18 ENCOUNTER — Encounter (HOSPITAL_COMMUNITY)
Admission: RE | Admit: 2023-12-18 | Discharge: 2023-12-18 | Disposition: A | Discharge status: Home / Self Care | Payer: 59 | Source: Ambulatory Visit | Attending: Pulmonary Disease | Admitting: Pulmonary Disease

## 2023-12-18 DIAGNOSIS — J849 Interstitial pulmonary disease, unspecified: Secondary | ICD-10-CM | POA: Insufficient documentation

## 2023-12-18 DIAGNOSIS — J984 Other disorders of lung: Secondary | ICD-10-CM | POA: Diagnosis present

## 2023-12-18 NOTE — Progress Notes (Signed)
 Daily Session Note  Patient Details  Name: Tiffany Coleman MRN: [id number] Date of Birth: June 23, 1956 Referring Provider:   Gattis Kass Pulmonary Rehab Walk Test from 09/28/2023 in [facility] for Heart, Vascular, & Lung Health  Referring Provider [name]       Encounter Date: 12/18/2023  Check In:  Session Check In - 12/18/23 1419       Check-In   Supervising physician immediately available to respond to emergencies [facility] MD immediately available    Physician(s) Slater Duncan, NP    [facility]    Staff Present Atlas Lea, MS, ACSM-CEP, Exercise Physiologist;Lantz Hermann Rochelle Chu, ACSM-CEP, Exercise Physiologist;Casey Ena Harries, MS, Exercise Physiologist;Mary Arlester Ladd, RN, BSN    Virtual Visit No    Medication changes reported     No    Fall or balance concerns reported    No    Tobacco Cessation No Change    Warm-up and Cool-down Performed as group-led instruction    Resistance Training Performed Yes    VAD Patient? No    PAD/SET Patient? No      Pain Assessment   Currently in Pain? No/denies    Multiple Pain Sites No             Capillary Blood Glucose: No results found for this or any previous visit (from the past 24 hours).    Social History   Tobacco Use  Smoking Status Former   Current packs/day: 0.00   Types: Cigarettes   Start date: 05/26/2000   Quit date: 05/26/2020   Years since quitting: 3.5   Passive exposure: Past  Smokeless Tobacco Never    Goals Met:  Independence with exercise equipment Exercise tolerated well No report of concerns or symptoms today Strength training completed today  Goals Unmet:  Not Applicable  Comments: Service time is from 1302 to 1436.    Dr. Genetta Kenning is Medical Director for Pulmonary Rehab at [facility].

## 2023-12-18 NOTE — Telephone Encounter (Signed)
 DOS: 12/24/23   KELLER BUNION IMPLANT (RT)     EFFECTIVE DATE: 08/15/23   DEDUCTIBLE:  $257.00   REMAINING: $0.00   OOP:   $9,350.00   REMAINING:   $9,024.63   CO INSURANCE : 30%   PER THE [facility] PROVIDER PORTAL NO PRIOR AUTH IS REQ FOR CPT CODE 32440   REF# [id number]

## 2023-12-19 ENCOUNTER — Other Ambulatory Visit: Payer: Self-pay

## 2023-12-19 ENCOUNTER — Encounter
Admission: RE | Admit: 2023-12-19 | Discharge: 2023-12-19 | Disposition: A | Discharge status: Home / Self Care | Source: Ambulatory Visit | Attending: Podiatry | Admitting: Podiatry

## 2023-12-19 ENCOUNTER — Telehealth: Payer: Self-pay | Admitting: Podiatry

## 2023-12-19 VITALS — Ht 63.5 in | Wt 172.0 lb

## 2023-12-19 DIAGNOSIS — Z01818 Encounter for other preprocedural examination: Secondary | ICD-10-CM

## 2023-12-19 HISTORY — DX: Morbid (severe) obesity due to excess calories: E66.01

## 2023-12-19 HISTORY — DX: Personal history of COVID-19: Z86.16

## 2023-12-19 HISTORY — DX: Type 2 diabetes mellitus without complications: E11.9

## 2023-12-19 HISTORY — DX: Other disorders of lung: J98.4

## 2023-12-19 HISTORY — DX: Dependence on supplemental oxygen: Z99.81

## 2023-12-19 HISTORY — DX: Gastro-esophageal reflux disease without esophagitis: K21.9

## 2023-12-19 HISTORY — DX: Anxiety disorder, unspecified: F41.9

## 2023-12-19 HISTORY — DX: Interstitial pulmonary disease, unspecified: J84.9

## 2023-12-19 HISTORY — DX: Hallux rigidus, right foot: M20.21

## 2023-12-19 NOTE — Patient Instructions (Signed)
 Your procedure is scheduled on:12-24-23 Monday Report to the Registration Desk on the 1st floor of the [facility].Then proceed to the 2nd floor Surgery Desk To find out your arrival time, please call [phone] between 1PM - 3PM on:12-21-23 Friday If your arrival time is 6:00 am, do not arrive before that time as the [facility] entrance doors do not open until 6:00 am.  REMEMBER: Instructions that are not followed completely may result in serious medical risk, up to and including death; or upon the discretion of your surgeon and anesthesiologist your surgery may need to be rescheduled.  Do not eat food after midnight the night before surgery.  No gum chewing or hard candies.  You may however, drink Water up to 2 hours before you are scheduled to arrive for your surgery. Do not drink anything within 2 hours of your scheduled arrival time.  One week prior to surgery:Stop NOW (12-19-23) Stop Anti-inflammatories (NSAIDS) such as Advil , Aleve , Ibuprofen , Motrin , Naproxen , Naprosyn  and Aspirin  based products such as Excedrin, Goody's Powder, BC Powder. Stop ANY OVER THE COUNTER supplements until after surgery.  You may however, continue to take Tylenol  if needed for pain up until the day of surgery.  Stop tirzepatide [facility]) 7 days prior to surgery-Do NOT take again until AFTER surgery  Continue taking all of your other prescription medications up until the day of surgery.  ON THE DAY OF SURGERY ONLY TAKE THESE MEDICATIONS WITH SIPS OF WATER: -pantoprazole  (PROTONIX )  -You may take hydrOXYzine (ATARAX) if needed for anxiety  Use your ipratropium-albuterol  Duoneb (breathing treatment) at home the morning of surgery and bring your Albuterol  Inhaler to the hospital  No Alcohol for 24 hours before or after surgery.  No Smoking including e-cigarettes for 24 hours before surgery.  No chewable tobacco products for at least 6 hours before surgery.  No nicotine patches on the day of  surgery.  Do not use any "recreational" drugs for at least a week (preferably 2 weeks) before your surgery.  Please be advised that the combination of cocaine and anesthesia may have negative outcomes, up to and including death. If you test positive for cocaine, your surgery will be cancelled.  On the morning of surgery brush your teeth with toothpaste and water, you may rinse your mouth with mouthwash if you wish. Do not swallow any toothpaste or mouthwash.  Use an antibacterial soap (YQ:MVHQ) to shower with the night before surgery and again the morning of surgery  Do not wear jewelry, make-up, hairpins, clips or nail polish.  For welded (permanent) jewelry: bracelets, anklets, waist bands, etc.  Please have this removed prior to surgery.  If it is not removed, there is a chance that hospital personnel will need to cut it off on the day of surgery.  Do not wear lotions, powders, or perfumes.   Do not shave body hair from the neck down 48 hours before surgery.  Contact lenses, hearing aids and dentures may not be worn into surgery.  Do not bring valuables to the hospital. [facility] is not responsible for any missing/lost belongings or valuables.   Notify your doctor if there is any change in your medical condition (cold, fever, infection).  Wear comfortable clothing (specific to your surgery type) to the hospital.  After surgery, you can help prevent lung complications by doing breathing exercises.  Take deep breaths and cough every 1-2 hours. Your doctor may order a device called an Incentive Spirometer to help you take deep breaths.  When coughing or sneezing, hold a pillow firmly against your incision with both hands. This is called "splinting." Doing this helps protect your incision. It also decreases belly discomfort.  If you are being admitted to the hospital overnight, leave your suitcase in the car. After surgery it may be brought to your room.  In case of increased patient  census, it may be necessary for you, the patient, to continue your postoperative care in the Same Day Surgery department.  If you are being discharged the day of surgery, you will not be allowed to drive home. You will need a responsible individual to drive you home and stay with you for 24 hours after surgery.   If you are taking public transportation, you will need to have a responsible individual with you.  Please call the Pre-admissions Testing Dept. at [phone] if you have any questions about these instructions.  Surgery Visitation Policy:  Patients having surgery or a procedure may have two visitors.  Children under the age of 86 must have an adult with them who is not the patient.

## 2023-12-19 NOTE — Telephone Encounter (Signed)
 Called pt and told her Dr Lydia Sams has not received any transportation forms. She is calling them and will have it faxed to my direct fax so we can get it filled out.

## 2023-12-19 NOTE — Telephone Encounter (Signed)
 Pt called yesterday 5/6 afternoon at 426pm asking if we had received the documents needed for her transportation to the surgery at [facility] that is scheduled for 5/12. They need it back asap. She has to have 5 days notice. I did not see it in the paperwork to be signed.

## 2023-12-20 ENCOUNTER — Other Ambulatory Visit (HOSPITAL_COMMUNITY): Payer: Self-pay | Admitting: *Deleted

## 2023-12-20 ENCOUNTER — Encounter (HOSPITAL_COMMUNITY)
Admission: RE | Admit: 2023-12-20 | Discharge: 2023-12-20 | Disposition: A | Discharge status: Home / Self Care | Payer: 59 | Source: Ambulatory Visit | Attending: Pulmonary Disease | Admitting: Pulmonary Disease

## 2023-12-20 DIAGNOSIS — J849 Interstitial pulmonary disease, unspecified: Secondary | ICD-10-CM | POA: Diagnosis not present

## 2023-12-20 DIAGNOSIS — R079 Chest pain, unspecified: Secondary | ICD-10-CM

## 2023-12-20 DIAGNOSIS — J984 Other disorders of lung: Secondary | ICD-10-CM | POA: Diagnosis not present

## 2023-12-20 NOTE — Progress Notes (Signed)
 Daily Session Note  Patient Details  Name: Tiffany Coleman MRN: [id number] Date of Birth: [birth year] Referring Provider:   Gattis Kass Pulmonary Rehab Walk Test from 09/28/2023 in [facility] for Heart, Vascular, & Lung Health  Referring Provider [name]       Encounter Date: 12/20/2023  Check In:  Session Check In - 12/20/23 1426       Check-In   Supervising physician immediately available to respond to emergencies [facility] MD immediately available    Physician(s) Levin Reamer, NP    [facility]    Staff Present Atlas Lea, MS, ACSM-CEP, Exercise Physiologist;Tayona Sarnowski Rochelle Chu, ACSM-CEP, Exercise Physiologist;Casey Carmen Chol, RN, Mercedes Stake, RN, Malvin Searing, MS, ACSM-CEP, CCRP, Exercise Physiologist    Virtual Visit No    Medication changes reported     No    Fall or balance concerns reported    No    Tobacco Cessation No Change    Warm-up and Cool-down Performed as group-led instruction    Resistance Training Performed Yes    VAD Patient? No    PAD/SET Patient? No      Pain Assessment   Currently in Pain? No/denies    Multiple Pain Sites No             Capillary Blood Glucose: No results found for this or any previous visit (from the past 24 hours).    Social History   Tobacco Use  Smoking Status Some Days   Current packs/day: 0.00   Types: Cigarettes   Start date: 05/26/2000   Last attempt to quit: 05/26/2020   Years since quitting: 3.5   Passive exposure: Past  Smokeless Tobacco Never    Goals Met:  No report of concerns or symptoms today Strength training completed today  Goals Unmet:  Not Applicable  Comments: Pt performed 6 min walk test and graduated. Service time is from 1312 to 1444.    Dr. Genetta Kenning is Medical Director for Pulmonary Rehab at [facility].

## 2023-12-20 NOTE — Progress Notes (Signed)
 Exercise physiologist reported that the patient had 8/10 chest pain that radiate to her back toward the end of her 6 minute walk test.Blood pressure 192/70 heart rate 138. Patient said she really pushed herself. Oxygen saturation 89% on room air. Upon asking the patient said that her chest pain was going away. Patient was taken to the treatment room and placed on the Zoll. Telemetry rhythm Sinus rate 77. Arjanae said that the chest pain went away. Onsite provider Morey Ar NP notified. 12 lead ECG ordered and obtained. Patient was placed on 2l/min of oxygen. Oxygen saturation 99% on 2l/min. After the pain Sadey said that her left arm felt a little numb. Patient's arm numbness went away after a few minutes. Patient says she has a previous history of having neck problems. Onsite provider Morey Ar NP reviewed 12 lead ECG and evaluated the patient. Bernardo Bridgeman noted no acute changes on her 12 lead ECG. Bernardo Bridgeman said that Ahniyah can go home. No further interventions recommended at this time. Morey Ar NP instructed the patient to call 911 if she has any more chest pain and to report to the nearest emergency room for treatment. Patient states understanding.  Recheck BP 146/70 heart rate 66. Oxygen saturation 100% on room air. Medications reviewed. Patient said that she took a rescue inhaler before coming to pulmonary rehab. Patient walked a lap around the gym without symptoms or complaints. Today is the patient's graduation day. Will send today's event to Dr Waylan Haggard referring provider and patient's primary care provider, Dr Dia Forget.Monte Antonio RN BSN

## 2023-12-21 NOTE — Progress Notes (Signed)
 Discharge Progress Report  Patient Details  Name: Tiffany Coleman MRN: [id number] Date of Birth: 1955-09-05 Referring Provider:   Gattis Kass [facility] Walk Test from 09/28/2023 in [facility]  Referring Provider [name]        Number of Visits: 17  Reason for Discharge:  Patient has met program and personal goals.  Smoking History:  Social History   Tobacco Use  Smoking Status Some Days   Current packs/day: 0.00   Types: Cigarettes   Start date: 05/26/2000   Last attempt to quit: 05/26/2020   Years since quitting: 3.5   Passive exposure: Past  Smokeless Tobacco Never    Diagnosis:  ILD (interstitial lung disease) (HCC)  ADL [facility]:  Pulmonary Assessment Scores     Row Name 09/28/23 1120         ADL [facility]   ADL Phase Entry     SOB Score total 16       CAT Score   CAT Score 13       mMRC Score   mMRC Score 2              Initial Exercise Prescription:  Initial Exercise Prescription - 09/28/23 1200       Date of Initial Exercise RX and Referring Provider   Date 09/28/23    Referring Provider [name]    Expected Discharge Date 12/20/23      Oxygen   Oxygen Continuous    Liters 2    Maintain Oxygen Saturation 88% or higher      Recumbant Bike   Level 2    RPM 70    Minutes 15    METs 2      Recumbant Elliptical   Level 2    RPM 70    Minutes 15    METs 2      Prescription Details   Frequency (times per week) 2    Duration Progress to 30 minutes of continuous aerobic without signs/symptoms of physical distress      Intensity   THRR 40-80% of Max Heartrate 60-121    Ratings of Perceived Exertion 11-13    Perceived Dyspnea 0-4      Progression   Progression Continue to progress workloads to maintain intensity without signs/symptoms of physical distress.      Resistance Training   Training Prescription Yes    Weight red bands    Reps 10-15              Discharge Exercise Prescription (Final Exercise Prescription Changes):  Exercise Prescription Changes - 12/11/23 1500       Response to Exercise   Blood Pressure (Admit) 124/60    Blood Pressure (Exit) 102/66    Heart Rate (Admit) 69 bpm    Heart Rate (Exercise) 100 bpm    Heart Rate (Exit) 73 bpm    Oxygen Saturation (Admit) 98 %    Oxygen Saturation (Exercise) 92 %    Oxygen Saturation (Exit) 98 %    Rating of Perceived Exertion (Exercise) 13    Perceived Dyspnea (Exercise) 0    Duration Continue with 30 min of aerobic exercise without signs/symptoms of physical distress.    Intensity THRR unchanged      Progression   Progression Continue to progress workloads to maintain intensity without signs/symptoms of physical distress.      Resistance Training   Training Prescription Yes    Weight blue bands  Reps 10-15    Time 10 Minutes      Oxygen   Oxygen Intermittent    Liters 0-2      Recumbant Elliptical   Level 3    Minutes 15    METs 3.7      Track   Laps 12    Minutes 15    METs 2.85      Oxygen   Maintain Oxygen Saturation 88% or higher             Functional Capacity:  6 Minute Walk     Row Name 09/28/23 1130 12/20/23 1543       6 Minute Walk   Phase Initial Discharge    Distance 1140 feet 1140 feet    Distance % Change -- 31.6 %    Distance Feet Change -- 360 ft    Walk Time 6 minutes 6 minutes    # of Rest Breaks 0 0    MPH 2.16 2.84    METS 2.52 3.98    RPE 9 --    Perceived Dyspnea  1 --    VO2 Peak 8.81 13.9    Symptoms No Yes (comment)    Comments -- had substernal CP at 5:15 radiating into back, taken to treatment room for EKG    Resting HR 80 bpm 91 bpm    Resting BP 114/70 150/70    Resting Oxygen Saturation  100 % 95 %    Exercise Oxygen Saturation  during 6 min walk 92 % 89 %    Max Ex. HR 102 bpm 138 bpm    Max Ex. BP 140/64 192/70    2 Minute Post BP 130/70 150/76      Interval HR   1 Minute HR 89 114    2 Minute  HR 101 127    3 Minute HR 98 132    4 Minute HR 102 132    5 Minute HR 100 136    6 Minute HR 101 138    2 Minute Post HR 75 103    Interval Heart Rate? Yes --      Interval Oxygen   Interval Oxygen? Yes --    Baseline Oxygen Saturation % 100 % 95 %    1 Minute Oxygen Saturation % 100 % 92 %    1 Minute Liters of Oxygen 2 L 0 L    2 Minute Oxygen Saturation % 97 % 90 %    2 Minute Liters of Oxygen 2 L 0 L    3 Minute Oxygen Saturation % 98 % 89 %    3 Minute Liters of Oxygen 2 L 0 L    4 Minute Oxygen Saturation % 94 % 91 %    4 Minute Liters of Oxygen 2 L 0 L    5 Minute Oxygen Saturation % 93 % 89 %    5 Minute Liters of Oxygen 2 L 0 L    6 Minute Oxygen Saturation % 92 % 89 %    6 Minute Liters of Oxygen 2 L 0 L    2 Minute Post Oxygen Saturation % 100 % 99 %    2 Minute Post Liters of Oxygen 2 L 0 L             Psychological, QOL, Others - Outcomes: PHQ 2/9:    09/28/2023   10:48 AM 08/11/2022    9:59 AM 11/23/2020    1:24 PM 10/01/2020  10:47 AM 11/02/2017   10:17 AM  Depression screen PHQ 2/9  Decreased Interest 0 0 1 0 0  Down, Depressed, Hopeless 1 0 0 2 0  PHQ - 2 Score 1 0 1 2 0  Altered sleeping 1 0 0 2   Tired, decreased energy 1 3 1 3    Change in appetite 0 0 0 0   Feeling bad or failure about yourself  0 0 0 0   Trouble concentrating 0 0 0 0   Moving slowly or fidgety/restless 0 0 0 0   Suicidal thoughts 0 0 0 0   PHQ-9 Score 3 3 2 7    Difficult doing work/chores Not difficult at all Not difficult at all Somewhat difficult Somewhat difficult     Quality of Life:   Personal Goals: Goals established at orientation with interventions provided to work toward goal.  Personal Goals and Risk Factors at Admission - 09/28/23 1045       Core Components/Risk Factors/Patient Goals on Admission   Improve shortness of breath with ADL's Yes    Intervention Provide education, individualized exercise plan and daily activity instruction to help decrease  symptoms of SOB with activities of daily living.    Expected Outcomes Short Term: Improve cardiorespiratory fitness to achieve a reduction of symptoms when performing ADLs;Long Term: Be able to perform more ADLs without symptoms or delay the onset of symptoms              Personal Goals Discharge:  Goals and Risk Factor Review     Row Name 10/03/23 1023 10/24/23 1335 11/23/23 0859 12/21/23 0924       Core Components/Risk Factors/Patient Goals Review   Personal Goals Review Improve shortness of breath with ADL's;Develop more efficient breathing techniques such as purse lipped breathing and diaphragmatic breathing and practicing self-pacing with activity. Improve shortness of breath with ADL's;Develop more efficient breathing techniques such as purse lipped breathing and diaphragmatic breathing and practicing self-pacing with activity. Improve shortness of breath with ADL's;Develop more efficient breathing techniques such as purse lipped breathing and diaphragmatic breathing and practicing self-pacing with activity. Improve shortness of breath with ADL's    Review Unable to assess Tiffany Coleman goals at this time. She was scheduled to start on 10/02/23, but was unable to attend. Goal progressing for improving shortness of breath. Tiffany Coleman is currently using 2L O2  while exercising to keep sats >88%. Goal progressing for developing more efficient breathing techniques such as purse lipped breathing and diaphragmatic breathing; and practicing self-pacing with activity. Tiffany Coleman is currently exercising on the recumbant elliptical and the recumbant bike. We will continue to monitor her progress throughout the program. Monthly review of patient's Core Components/Risk Factors/Patient Goals are as follows: Goal progressing for improving shortness of breath. Tiffany Coleman has been weaned off he oxygen while exercising to room air. Tiffany Coleman is currently exercising on the recumbent elliptical and the recumbent bike. She  has been able to increase her METs and workload while maintaining her oxygen level >88%. Goal met for developing more efficient breathing techniques such as purse lipped breathing and diaphragmatic breathing; and practicing self-pacing with activity. Tiffany Coleman can initiate PLB independently. She knows how to slow down her pace or stop when dyspneic. She is practicing diaphragmatic breathing at home, before bed. We will continue to monitor her goals progress throughout the program. Tiffany Coleman graduated from the [facility] program on 12/20/23. She met her goal for improving shortness of breath with ADL's. She was weaned off oxygen during her time in  [facility] and she states she has significant less SOB. Tiffany Coleman did great during the program. We wish her the best!    Expected Outcomes To improve shortness of breath with ADL's and develop more efficient breathing techniques such as purse lipped breathing and diaphragmatic breathing; and practicing self-pacing with activity To improve shortness of breath with ADL's and develop more efficient breathing techniques such as purse lipped breathing and diaphragmatic breathing; and practicing self-pacing with activity To improve shortness of breath with ADL's To continue to exercise and modify her nutrition and lifestyle post graduation             Exercise Goals and Review:  Exercise Goals     Row Name 09/28/23 1042             Exercise Goals   Increase Physical Activity Yes       Intervention Provide advice, education, support and counseling about physical activity/exercise needs.;Develop an individualized exercise prescription for aerobic and resistive training based on initial evaluation findings, risk stratification, comorbidities and participant's personal goals.       Expected Outcomes Short Term: Attend rehab on a regular basis to increase amount of physical activity.;Long Term: Add in home exercise to make exercise part of routine and to increase amount of physical  activity.;Long Term: Exercising regularly at least 3-5 days a week.       Increase Strength and Stamina Yes       Intervention Provide advice, education, support and counseling about physical activity/exercise needs.;Develop an individualized exercise prescription for aerobic and resistive training based on initial evaluation findings, risk stratification, comorbidities and participant's personal goals.       Expected Outcomes Short Term: Increase workloads from initial exercise prescription for resistance, speed, and METs.;Short Term: Perform resistance training exercises routinely during rehab and add in resistance training at home;Long Term: Improve cardiorespiratory fitness, muscular endurance and strength as measured by increased METs and functional capacity ( )       Able to understand and use rate of perceived exertion (RPE) scale Yes       Intervention Provide education and explanation on how to use RPE scale       Expected Outcomes Short Term: Able to use RPE daily in rehab to express subjective intensity level;Long Term:  Able to use RPE to guide intensity level when exercising independently       Able to understand and use Dyspnea scale Yes       Intervention Provide education and explanation on how to use Dyspnea scale       Expected Outcomes Short Term: Able to use Dyspnea scale daily in rehab to express subjective sense of shortness of breath during exertion;Long Term: Able to use Dyspnea scale to guide intensity level when exercising independently       Knowledge and understanding of Target Heart Rate Range (THRR) Yes       Intervention Provide education and explanation of THRR including how the numbers were predicted and where they are located for reference       Expected Outcomes Long Term: Able to use THRR to govern intensity when exercising independently;Short Term: Able to state/look up THRR;Short Term: Able to use daily as guideline for intensity in rehab       Understanding of  Exercise Prescription Yes       Intervention Provide education, explanation, and written materials on patient's individual exercise prescription       Expected Outcomes Short Term: Able to explain program exercise prescription;Long  Term: Able to explain home exercise prescription to exercise independently                Exercise Goals Re-Evaluation:  Exercise Goals Re-Evaluation     Row Name 09/28/23 1043 10/26/23 0922 11/19/23 1443 12/20/23 1547       Exercise Goal Re-Evaluation   Exercise Goals Review Increase Physical Activity;Able to understand and use Dyspnea scale;Understanding of Exercise Prescription;Increase Strength and Stamina;Knowledge and understanding of Target Heart Rate Range (THRR);Able to understand and use rate of perceived exertion (RPE) scale Increase Physical Activity;Able to understand and use Dyspnea scale;Understanding of Exercise Prescription;Increase Strength and Stamina;Knowledge and understanding of Target Heart Rate Range (THRR);Able to understand and use rate of perceived exertion (RPE) scale Increase Physical Activity;Able to understand and use Dyspnea scale;Understanding of Exercise Prescription;Increase Strength and Stamina;Knowledge and understanding of Target Heart Rate Range (THRR);Able to understand and use rate of perceived exertion (RPE) scale Increase Physical Activity;Able to understand and use Dyspnea scale;Understanding of Exercise Prescription;Increase Strength and Stamina;Knowledge and understanding of Target Heart Rate Range (THRR);Able to understand and use rate of perceived exertion (RPE) scale    Comments Tiffany Coleman is scheduled to exercise again on 2/18. Will continue to monitor and progress as able. Tiffany Coleman has completed 6 exercise sessions, missing one session. She was exercising on the recumbent bike for 15 min, level 2, METs 2.3. However her knee was hurting therefore changed to walking the track last session with decreased pain and increased  enjoyment. She walked 15 min, METs 2.54. She then is exercising on the recumbent elliptical for 15 min, level 2, METs 3.0. She is beginning to progress and is becoming more motivated. She performs warmup and cool down standing, working on squats. Using red bands, 3.7 lbs. Will encouraged blue bands, 5.8 lbs. Will continue to monitor and progress as able. Tiffany Coleman has completed 10 exercise sessions, missing two session. She is walking 15 min, METs 3. She then is exercising on the recumbent elliptical for 15 min, level 3, METs 4.2. She is beginning to progress and is becoming more motivated. She performs warmup and cool down standing, working on squats. Using blue bands, 5.8 lbs. Will continue to monitor and progress as able. Tiffany Coleman completed 17 exercise sessions. She declined extending due to upcoming foot surgery. She walked 15 min, METs 2.85. She then did 4.4 METs on the recumbent elliptical for 15 min. She increased her 6 min walk test by 31.6 %, 360 ft. She is so happy to increase her ability to exercise without O2. She did not turn in her homework for CAT or SOB.    Expected Outcomes Through exercise at rehab and home the patient will decrease shortness of breath with daily activities and feel confident in carrying out and exercise regimn at home. Through exercise at rehab and home the patient will decrease shortness of breath with daily activities and feel confident in carrying out and exercise regimn at home. Through exercise at rehab and home the patient will decrease shortness of breath with daily activities and feel confident in carrying out and exercise regimn at home. Through exercise at rehab and home the patient will decrease shortness of breath with daily activities and feel confident in carrying out and exercise regimn at home.             Nutrition & Weight - Outcomes:  Pre Biometrics - 09/28/23 1215       Pre Biometrics   Grip Strength 21 kg  Nutrition:   Nutrition Therapy & Goals - 11/26/23 1044       Nutrition Therapy   Diet Heart Healthy Diet      Personal Nutrition Goals   Nutrition Goal Patient to improve diet quality by using the plate method as a guide for meal planning to include lean protein/plant protein, fruits, vegetables, whole grains, nonfat dairy as part of a well-balanced diet.    Comments Goal in progress. Tiffany Coleman has medical hisotry of pneumonitis, ILD, HTN, OSA. She is down 1.5# since starting with our program. Answered patient questions regarding sugar intake and strategies for reducing added sugar. Patient will continue to benefit from participation in pulmonary rehab for nutrition, exercise, and lifestyle modification support.      Intervention Plan   Intervention Prescribe, educate and counsel regarding individualized specific dietary modifications aiming towards targeted core components such as weight, hypertension, lipid management, diabetes, heart failure and other comorbidities.;Nutrition handout(s) given to patient.    Expected Outcomes Short Term Goal: Understand basic principles of dietary content, such as calories, fat, sodium, cholesterol and nutrients.;Long Term Goal: Adherence to prescribed nutrition plan.             Nutrition Discharge:   Education Questionnaire Score:  Knowledge Questionnaire Score - 09/28/23 1118       Knowledge Questionnaire Score   Pre Score 13/18             Goals reviewed with patient; copy given to patient.

## 2023-12-21 NOTE — H&P (View-Only) (Signed)
 Discharge Progress Report  Patient Details  Name: NYEMIAH JOY MRN: [id number] Date of Birth: 1955-09-05 Referring Provider:   Gattis Kass Pulmonary Rehab Walk Test from 09/28/2023 in [facility]  Referring Provider [name]        Number of Visits: 17  Reason for Discharge:  Patient has met program and personal goals.  Smoking History:  Social History   Tobacco Use  Smoking Status Some Days   Current packs/day: 0.00   Types: Cigarettes   Start date: 05/26/2000   Last attempt to quit: 05/26/2020   Years since quitting: 3.5   Passive exposure: Past  Smokeless Tobacco Never    Diagnosis:  ILD (interstitial lung disease) (HCC)  ADL UCSD:  Pulmonary Assessment Scores     Row Name 09/28/23 1120         ADL UCSD   ADL Phase Entry     SOB Score total 16       CAT Score   CAT Score 13       mMRC Score   mMRC Score 2              Initial Exercise Prescription:  Initial Exercise Prescription - 09/28/23 1200       Date of Initial Exercise RX and Referring Provider   Date 09/28/23    Referring Provider [name]    Expected Discharge Date 12/20/23      Oxygen   Oxygen Continuous    Liters 2    Maintain Oxygen Saturation 88% or higher      Recumbant Bike   Level 2    RPM 70    Minutes 15    METs 2      Recumbant Elliptical   Level 2    RPM 70    Minutes 15    METs 2      Prescription Details   Frequency (times per week) 2    Duration Progress to 30 minutes of continuous aerobic without signs/symptoms of physical distress      Intensity   THRR 40-80% of Max Heartrate 60-121    Ratings of Perceived Exertion 11-13    Perceived Dyspnea 0-4      Progression   Progression Continue to progress workloads to maintain intensity without signs/symptoms of physical distress.      Resistance Training   Training Prescription Yes    Weight red bands    Reps 10-15              Discharge Exercise Prescription (Final Exercise Prescription Changes):  Exercise Prescription Changes - 12/11/23 1500       Response to Exercise   Blood Pressure (Admit) 124/60    Blood Pressure (Exit) 102/66    Heart Rate (Admit) 69 bpm    Heart Rate (Exercise) 100 bpm    Heart Rate (Exit) 73 bpm    Oxygen Saturation (Admit) 98 %    Oxygen Saturation (Exercise) 92 %    Oxygen Saturation (Exit) 98 %    Rating of Perceived Exertion (Exercise) 13    Perceived Dyspnea (Exercise) 0    Duration Continue with 30 min of aerobic exercise without signs/symptoms of physical distress.    Intensity THRR unchanged      Progression   Progression Continue to progress workloads to maintain intensity without signs/symptoms of physical distress.      Resistance Training   Training Prescription Yes    Weight blue bands  Reps 10-15    Time 10 Minutes      Oxygen   Oxygen Intermittent    Liters 0-2      Recumbant Elliptical   Level 3    Minutes 15    METs 3.7      Track   Laps 12    Minutes 15    METs 2.85      Oxygen   Maintain Oxygen Saturation 88% or higher             Functional Capacity:  6 Minute Walk     Row Name 09/28/23 1130 12/20/23 1543       6 Minute Walk   Phase Initial Discharge    Distance 1140 feet 1140 feet    Distance % Change -- 31.6 %    Distance Feet Change -- 360 ft    Walk Time 6 minutes 6 minutes    # of Rest Breaks 0 0    MPH 2.16 2.84    METS 2.52 3.98    RPE 9 --    Perceived Dyspnea  1 --    VO2 Peak 8.81 13.9    Symptoms No Yes (comment)    Comments -- had substernal CP at 5:15 radiating into back, taken to treatment room for EKG    Resting HR 80 bpm 91 bpm    Resting BP 114/70 150/70    Resting Oxygen Saturation  100 % 95 %    Exercise Oxygen Saturation  during 6 min walk 92 % 89 %    Max Ex. HR 102 bpm 138 bpm    Max Ex. BP 140/64 192/70    2 Minute Post BP 130/70 150/76      Interval HR   1 Minute HR 89 114    2 Minute  HR 101 127    3 Minute HR 98 132    4 Minute HR 102 132    5 Minute HR 100 136    6 Minute HR 101 138    2 Minute Post HR 75 103    Interval Heart Rate? Yes --      Interval Oxygen   Interval Oxygen? Yes --    Baseline Oxygen Saturation % 100 % 95 %    1 Minute Oxygen Saturation % 100 % 92 %    1 Minute Liters of Oxygen 2 L 0 L    2 Minute Oxygen Saturation % 97 % 90 %    2 Minute Liters of Oxygen 2 L 0 L    3 Minute Oxygen Saturation % 98 % 89 %    3 Minute Liters of Oxygen 2 L 0 L    4 Minute Oxygen Saturation % 94 % 91 %    4 Minute Liters of Oxygen 2 L 0 L    5 Minute Oxygen Saturation % 93 % 89 %    5 Minute Liters of Oxygen 2 L 0 L    6 Minute Oxygen Saturation % 92 % 89 %    6 Minute Liters of Oxygen 2 L 0 L    2 Minute Post Oxygen Saturation % 100 % 99 %    2 Minute Post Liters of Oxygen 2 L 0 L             Psychological, QOL, Others - Outcomes: PHQ 2/9:    09/28/2023   10:48 AM 08/11/2022    9:59 AM 11/23/2020    1:24 PM 10/01/2020  10:47 AM 11/02/2017   10:17 AM  Depression screen PHQ 2/9  Decreased Interest 0 0 1 0 0  Down, Depressed, Hopeless 1 0 0 2 0  PHQ - 2 Score 1 0 1 2 0  Altered sleeping 1 0 0 2   Tired, decreased energy 1 3 1 3    Change in appetite 0 0 0 0   Feeling bad or failure about yourself  0 0 0 0   Trouble concentrating 0 0 0 0   Moving slowly or fidgety/restless 0 0 0 0   Suicidal thoughts 0 0 0 0   PHQ-9 Score 3 3 2 7    Difficult doing work/chores Not difficult at all Not difficult at all Somewhat difficult Somewhat difficult     Quality of Life:   Personal Goals: Goals established at orientation with interventions provided to work toward goal.  Personal Goals and Risk Factors at Admission - 09/28/23 1045       Core Components/Risk Factors/Patient Goals on Admission   Improve shortness of breath with ADL's Yes    Intervention Provide education, individualized exercise plan and daily activity instruction to help decrease  symptoms of SOB with activities of daily living.    Expected Outcomes Short Term: Improve cardiorespiratory fitness to achieve a reduction of symptoms when performing ADLs;Long Term: Be able to perform more ADLs without symptoms or delay the onset of symptoms              Personal Goals Discharge:  Goals and Risk Factor Review     Row Name 10/03/23 1023 10/24/23 1335 11/23/23 0859 12/21/23 0924       Core Components/Risk Factors/Patient Goals Review   Personal Goals Review Improve shortness of breath with ADL's;Develop more efficient breathing techniques such as purse lipped breathing and diaphragmatic breathing and practicing self-pacing with activity. Improve shortness of breath with ADL's;Develop more efficient breathing techniques such as purse lipped breathing and diaphragmatic breathing and practicing self-pacing with activity. Improve shortness of breath with ADL's;Develop more efficient breathing techniques such as purse lipped breathing and diaphragmatic breathing and practicing self-pacing with activity. Improve shortness of breath with ADL's    Review Unable to assess Beatrices goals at this time. She was scheduled to start on 10/02/23, but was unable to attend. Goal progressing for improving shortness of breath. Leia is currently using 2L O2  while exercising to keep sats >88%. Goal progressing for developing more efficient breathing techniques such as purse lipped breathing and diaphragmatic breathing; and practicing self-pacing with activity. Alicia is currently exercising on the recumbant elliptical and the recumbant bike. We will continue to monitor her progress throughout the program. Monthly review of patient's Core Components/Risk Factors/Patient Goals are as follows: Goal progressing for improving shortness of breath. Jacki has been weaned off he oxygen while exercising to room air. Juanika is currently exercising on the recumbent elliptical and the recumbent bike. She  has been able to increase her METs and workload while maintaining her oxygen level >88%. Goal met for developing more efficient breathing techniques such as purse lipped breathing and diaphragmatic breathing; and practicing self-pacing with activity. Tricie can initiate PLB independently. She knows how to slow down her pace or stop when dyspneic. She is practicing diaphragmatic breathing at home, before bed. We will continue to monitor her goals progress throughout the program. Adaleia graduated from the [facility] program on 12/20/23. She met her goal for improving shortness of breath with ADL's. She was weaned off oxygen during her time in  [facility] and she states she has significant less SOB. Sirley did great during the program. We wish her the best!    Expected Outcomes To improve shortness of breath with ADL's and develop more efficient breathing techniques such as purse lipped breathing and diaphragmatic breathing; and practicing self-pacing with activity To improve shortness of breath with ADL's and develop more efficient breathing techniques such as purse lipped breathing and diaphragmatic breathing; and practicing self-pacing with activity To improve shortness of breath with ADL's To continue to exercise and modify her nutrition and lifestyle post graduation             Exercise Goals and Review:  Exercise Goals     Row Name 09/28/23 1042             Exercise Goals   Increase Physical Activity Yes       Intervention Provide advice, education, support and counseling about physical activity/exercise needs.;Develop an individualized exercise prescription for aerobic and resistive training based on initial evaluation findings, risk stratification, comorbidities and participant's personal goals.       Expected Outcomes Short Term: Attend rehab on a regular basis to increase amount of physical activity.;Long Term: Add in home exercise to make exercise part of routine and to increase amount of physical  activity.;Long Term: Exercising regularly at least 3-5 days a week.       Increase Strength and Stamina Yes       Intervention Provide advice, education, support and counseling about physical activity/exercise needs.;Develop an individualized exercise prescription for aerobic and resistive training based on initial evaluation findings, risk stratification, comorbidities and participant's personal goals.       Expected Outcomes Short Term: Increase workloads from initial exercise prescription for resistance, speed, and METs.;Short Term: Perform resistance training exercises routinely during rehab and add in resistance training at home;Long Term: Improve cardiorespiratory fitness, muscular endurance and strength as measured by increased METs and functional capacity ( )       Able to understand and use rate of perceived exertion (RPE) scale Yes       Intervention Provide education and explanation on how to use RPE scale       Expected Outcomes Short Term: Able to use RPE daily in rehab to express subjective intensity level;Long Term:  Able to use RPE to guide intensity level when exercising independently       Able to understand and use Dyspnea scale Yes       Intervention Provide education and explanation on how to use Dyspnea scale       Expected Outcomes Short Term: Able to use Dyspnea scale daily in rehab to express subjective sense of shortness of breath during exertion;Long Term: Able to use Dyspnea scale to guide intensity level when exercising independently       Knowledge and understanding of Target Heart Rate Range (THRR) Yes       Intervention Provide education and explanation of THRR including how the numbers were predicted and where they are located for reference       Expected Outcomes Long Term: Able to use THRR to govern intensity when exercising independently;Short Term: Able to state/look up THRR;Short Term: Able to use daily as guideline for intensity in rehab       Understanding of  Exercise Prescription Yes       Intervention Provide education, explanation, and written materials on patient's individual exercise prescription       Expected Outcomes Short Term: Able to explain program exercise prescription;Long  Term: Able to explain home exercise prescription to exercise independently                Exercise Goals Re-Evaluation:  Exercise Goals Re-Evaluation     Row Name 09/28/23 1043 10/26/23 0922 11/19/23 1443 12/20/23 1547       Exercise Goal Re-Evaluation   Exercise Goals Review Increase Physical Activity;Able to understand and use Dyspnea scale;Understanding of Exercise Prescription;Increase Strength and Stamina;Knowledge and understanding of Target Heart Rate Range (THRR);Able to understand and use rate of perceived exertion (RPE) scale Increase Physical Activity;Able to understand and use Dyspnea scale;Understanding of Exercise Prescription;Increase Strength and Stamina;Knowledge and understanding of Target Heart Rate Range (THRR);Able to understand and use rate of perceived exertion (RPE) scale Increase Physical Activity;Able to understand and use Dyspnea scale;Understanding of Exercise Prescription;Increase Strength and Stamina;Knowledge and understanding of Target Heart Rate Range (THRR);Able to understand and use rate of perceived exertion (RPE) scale Increase Physical Activity;Able to understand and use Dyspnea scale;Understanding of Exercise Prescription;Increase Strength and Stamina;Knowledge and understanding of Target Heart Rate Range (THRR);Able to understand and use rate of perceived exertion (RPE) scale    Comments Octayvia is scheduled to exercise again on 2/18. Will continue to monitor and progress as able. Leonila has completed 6 exercise sessions, missing one session. She was exercising on the recumbent bike for 15 min, level 2, METs 2.3. However her knee was hurting therefore changed to walking the track last session with decreased pain and increased  enjoyment. She walked 15 min, METs 2.54. She then is exercising on the recumbent elliptical for 15 min, level 2, METs 3.0. She is beginning to progress and is becoming more motivated. She performs warmup and cool down standing, working on squats. Using red bands, 3.7 lbs. Will encouraged blue bands, 5.8 lbs. Will continue to monitor and progress as able. Darnell has completed 10 exercise sessions, missing two session. She is walking 15 min, METs 3. She then is exercising on the recumbent elliptical for 15 min, level 3, METs 4.2. She is beginning to progress and is becoming more motivated. She performs warmup and cool down standing, working on squats. Using blue bands, 5.8 lbs. Will continue to monitor and progress as able. Emmalee completed 17 exercise sessions. She declined extending due to upcoming foot surgery. She walked 15 min, METs 2.85. She then did 4.4 METs on the recumbent elliptical for 15 min. She increased her 6 min walk test by 31.6 %, 360 ft. She is so happy to increase her ability to exercise without O2. She did not turn in her homework for CAT or SOB.    Expected Outcomes Through exercise at rehab and home the patient will decrease shortness of breath with daily activities and feel confident in carrying out and exercise regimn at home. Through exercise at rehab and home the patient will decrease shortness of breath with daily activities and feel confident in carrying out and exercise regimn at home. Through exercise at rehab and home the patient will decrease shortness of breath with daily activities and feel confident in carrying out and exercise regimn at home. Through exercise at rehab and home the patient will decrease shortness of breath with daily activities and feel confident in carrying out and exercise regimn at home.             Nutrition & Weight - Outcomes:  Pre Biometrics - 09/28/23 1215       Pre Biometrics   Grip Strength 21 kg  Nutrition:   Nutrition Therapy & Goals - 11/26/23 1044       Nutrition Therapy   Diet Heart Healthy Diet      Personal Nutrition Goals   Nutrition Goal Patient to improve diet quality by using the plate method as a guide for meal planning to include lean protein/plant protein, fruits, vegetables, whole grains, nonfat dairy as part of a well-balanced diet.    Comments Goal in progress. Voni has medical hisotry of pneumonitis, ILD, HTN, OSA. She is down 1.5# since starting with our program. Answered patient questions regarding sugar intake and strategies for reducing added sugar. Patient will continue to benefit from participation in pulmonary rehab for nutrition, exercise, and lifestyle modification support.      Intervention Plan   Intervention Prescribe, educate and counsel regarding individualized specific dietary modifications aiming towards targeted core components such as weight, hypertension, lipid management, diabetes, heart failure and other comorbidities.;Nutrition handout(s) given to patient.    Expected Outcomes Short Term Goal: Understand basic principles of dietary content, such as calories, fat, sodium, cholesterol and nutrients.;Long Term Goal: Adherence to prescribed nutrition plan.             Nutrition Discharge:   Education Questionnaire Score:  Knowledge Questionnaire Score - 09/28/23 1118       Knowledge Questionnaire Score   Pre Score 13/18             Goals reviewed with patient; copy given to patient.

## 2023-12-24 ENCOUNTER — Other Ambulatory Visit: Payer: Self-pay | Admitting: Podiatry

## 2023-12-24 ENCOUNTER — Ambulatory Visit

## 2023-12-24 ENCOUNTER — Ambulatory Visit: Admitting: Certified Registered"

## 2023-12-24 ENCOUNTER — Ambulatory Visit
Admission: RE | Admit: 2023-12-24 | Discharge: 2023-12-24 | Disposition: A | Discharge status: Home / Self Care | Attending: Podiatry | Admitting: Podiatry

## 2023-12-24 ENCOUNTER — Encounter
Admission: RE | Disposition: A | Discharge status: Home / Self Care | Payer: Self-pay | Source: Home / Self Care | Attending: Podiatry

## 2023-12-24 ENCOUNTER — Other Ambulatory Visit: Payer: Self-pay

## 2023-12-24 ENCOUNTER — Encounter: Payer: Self-pay | Admitting: Podiatry

## 2023-12-24 DIAGNOSIS — I110 Hypertensive heart disease with heart failure: Secondary | ICD-10-CM | POA: Insufficient documentation

## 2023-12-24 DIAGNOSIS — J9691 Respiratory failure, unspecified with hypoxia: Secondary | ICD-10-CM | POA: Insufficient documentation

## 2023-12-24 DIAGNOSIS — J449 Chronic obstructive pulmonary disease, unspecified: Secondary | ICD-10-CM | POA: Diagnosis not present

## 2023-12-24 DIAGNOSIS — G4730 Sleep apnea, unspecified: Secondary | ICD-10-CM | POA: Diagnosis not present

## 2023-12-24 DIAGNOSIS — K219 Gastro-esophageal reflux disease without esophagitis: Secondary | ICD-10-CM | POA: Diagnosis not present

## 2023-12-24 DIAGNOSIS — E1142 Type 2 diabetes mellitus with diabetic polyneuropathy: Secondary | ICD-10-CM | POA: Diagnosis not present

## 2023-12-24 DIAGNOSIS — M19071 Primary osteoarthritis, right ankle and foot: Secondary | ICD-10-CM | POA: Insufficient documentation

## 2023-12-24 DIAGNOSIS — Z01818 Encounter for other preprocedural examination: Secondary | ICD-10-CM

## 2023-12-24 DIAGNOSIS — I509 Heart failure, unspecified: Secondary | ICD-10-CM | POA: Diagnosis not present

## 2023-12-24 HISTORY — PX: HALLUX VALGUS CHEILECTOMY: SHX6625

## 2023-12-24 LAB — GLUCOSE, CAPILLARY
Glucose-Capillary: 92 mg/dL (ref 70–99)
Glucose-Capillary: 84 mg/dL (ref 70–99)

## 2023-12-24 SURGERY — CHEILECTOMY, GREAT TOE, WITH IMPLANT INSERTION
Anesthesia: General | Site: Foot | Laterality: Right

## 2023-12-24 MED ORDER — PROPOFOL 10 MG/ML IV BOLUS
INTRAVENOUS | Status: DC | PRN
  Administered 2023-12-24: 170 mg via INTRAVENOUS

## 2023-12-24 MED ORDER — PHENYLEPHRINE 80 MCG/ML (10ML) SYRINGE FOR IV PUSH (FOR BLOOD PRESSURE SUPPORT)
PREFILLED_SYRINGE | INTRAVENOUS | Status: DC | PRN
  Administered 2023-12-24 (×3): 80 ug via INTRAVENOUS
  Administered 2023-12-24: 160 ug via INTRAVENOUS

## 2023-12-24 MED ORDER — OXYCODONE-ACETAMINOPHEN 5-325 MG PO TABS: 1.0000 | ORAL_TABLET | ORAL | 0 refills | Status: DC | PRN

## 2023-12-24 MED ORDER — OXYCODONE HCL 5 MG PO TABS: 5.0000 mg | ORAL_TABLET | Freq: Once | ORAL | Status: DC | PRN

## 2023-12-24 MED ORDER — ORAL CARE MOUTH RINSE: 15.0000 mL | Freq: Once | OROMUCOSAL | Status: AC

## 2023-12-24 MED ORDER — CHLORHEXIDINE GLUCONATE 0.12 % MT SOLN
15.0000 mL | Freq: Once | OROMUCOSAL | Status: AC
  Administered 2023-12-24: 15 mL via OROMUCOSAL

## 2023-12-24 MED ORDER — BUPIVACAINE HCL (PF) 0.5 % IJ SOLN
INTRAMUSCULAR | Status: AC
  Filled 2023-12-24: qty 30

## 2023-12-24 MED ORDER — SODIUM CHLORIDE 0.9 % IV SOLN: INTRAVENOUS | Status: DC

## 2023-12-24 MED ORDER — LACTATED RINGERS IV SOLN
INTRAVENOUS | Status: DC | PRN
Start: 2023-12-24 — End: 2023-12-24

## 2023-12-24 MED ORDER — IBUPROFEN 800 MG PO TABS: 800.0000 mg | ORAL_TABLET | Freq: Four times a day (QID) | ORAL | 1 refills | Status: DC | PRN

## 2023-12-24 MED ORDER — LIDOCAINE HCL (CARDIAC) PF 100 MG/5ML IV SOSY
PREFILLED_SYRINGE | INTRAVENOUS | Status: DC | PRN
  Administered 2023-12-24: 80 mg via INTRAVENOUS

## 2023-12-24 MED ORDER — FENTANYL CITRATE (PF) 100 MCG/2ML IJ SOLN: 25.0000 ug | INTRAMUSCULAR | Status: DC | PRN

## 2023-12-24 MED ORDER — CEFAZOLIN SODIUM-DEXTROSE 2-4 GM/100ML-% IV SOLN
2.0000 g | Freq: Once | INTRAVENOUS | Status: AC
  Administered 2023-12-24: 2 g via INTRAVENOUS

## 2023-12-24 MED ORDER — DEXAMETHASONE SODIUM PHOSPHATE 10 MG/ML IJ SOLN
INTRAMUSCULAR | Status: DC | PRN
  Administered 2023-12-24: 10 mg via INTRAVENOUS

## 2023-12-24 MED ORDER — PROPOFOL 10 MG/ML IV BOLUS
INTRAVENOUS | Status: AC
  Filled 2023-12-24: qty 20

## 2023-12-24 MED ORDER — CHLORHEXIDINE GLUCONATE 0.12 % MT SOLN
OROMUCOSAL | Status: AC
  Filled 2023-12-24: qty 15

## 2023-12-24 MED ORDER — ONDANSETRON HCL 4 MG/2ML IJ SOLN
INTRAMUSCULAR | Status: DC | PRN
  Administered 2023-12-24: 4 mg via INTRAVENOUS

## 2023-12-24 MED ORDER — FENTANYL CITRATE (PF) 100 MCG/2ML IJ SOLN
INTRAMUSCULAR | Status: DC | PRN
  Administered 2023-12-24: 50 ug via INTRAVENOUS
  Administered 2023-12-24 (×2): 25 ug via INTRAVENOUS

## 2023-12-24 MED ORDER — OXYCODONE HCL 5 MG/5ML PO SOLN: 5.0000 mg | Freq: Once | ORAL | Status: DC | PRN

## 2023-12-24 MED ORDER — BUPIVACAINE HCL (PF) 0.5 % IJ SOLN
INTRAMUSCULAR | Status: DC | PRN
  Administered 2023-12-24: 10 mL

## 2023-12-24 MED ORDER — CEFAZOLIN SODIUM-DEXTROSE 2-4 GM/100ML-% IV SOLN
INTRAVENOUS | Status: AC
  Filled 2023-12-24: qty 100

## 2023-12-24 MED ORDER — MIDAZOLAM HCL 2 MG/2ML IJ SOLN
INTRAMUSCULAR | Status: AC
  Filled 2023-12-24: qty 2

## 2023-12-24 MED ORDER — FENTANYL CITRATE (PF) 100 MCG/2ML IJ SOLN
INTRAMUSCULAR | Status: AC
  Filled 2023-12-24: qty 2

## 2023-12-24 SURGICAL SUPPLY — 37 items
BLADE AVERAGE 25X9 (BLADE) IMPLANT
BLADE OSC/SAG .038X5.5 CUT EDG (BLADE) IMPLANT
BLADE SAW LAPIPLASTY 40X11 (BLADE) IMPLANT
BLADE SURG 15 STRL LF DISP TIS (BLADE) ×2 IMPLANT
BLADE SW THK.38XMED LNG THN (BLADE) IMPLANT
BNDG ELASTIC 3INX 5YD STR LF (GAUZE/BANDAGES/DRESSINGS) ×1 IMPLANT
BNDG ELASTIC 4INX 5YD STR LF (GAUZE/BANDAGES/DRESSINGS) ×1 IMPLANT
BNDG ELASTIC 4X5.8 VLCR NS LF (GAUZE/BANDAGES/DRESSINGS) IMPLANT
BNDG ESMARCH 4X12 STRL LF (GAUZE/BANDAGES/DRESSINGS) IMPLANT
BNDG GAUZE DERMACEA FLUFF 4 (GAUZE/BANDAGES/DRESSINGS) ×1 IMPLANT
BOOT STEPPER DURA MED (SOFTGOODS) IMPLANT
BUR 4X45 EGG (BURR) IMPLANT
CHLORAPREP W/TINT 26 (MISCELLANEOUS) ×1 IMPLANT
CUFF TOURN SGL QUICK 18X4 (TOURNIQUET CUFF) IMPLANT
DRAPE FLUOR MINI C-ARM 54X84 (DRAPES) IMPLANT
DRSG XEROFORM 1X8 (GAUZE/BANDAGES/DRESSINGS) IMPLANT
ELECTRODE REM PT RTRN 9FT ADLT (ELECTROSURGICAL) ×1 IMPLANT
GAUZE SPONGE 4X4 12PLY STRL (GAUZE/BANDAGES/DRESSINGS) ×1 IMPLANT
GAUZE STRETCH 2X75IN STRL (MISCELLANEOUS) ×1 IMPLANT
GLOVE BIO SURGEON STRL SZ7.5 (GLOVE) ×1 IMPLANT
GLOVE SURG SYN 7.0 (GLOVE) ×1 IMPLANT
GLOVE SURG SYN 7.0 PF PI (GLOVE) ×1 IMPLANT
GOWN STRL REUS W/ TWL LRG LVL3 (GOWN DISPOSABLE) ×1 IMPLANT
IMPL TOE INTEGRA CGT SZ 30 (Toe) IMPLANT
KIT TURNOVER KIT A (KITS) ×1 IMPLANT
NDL HYPO 25X1 1.5 SAFETY (NEEDLE) IMPLANT
NEEDLE HYPO 25X1 1.5 SAFETY (NEEDLE) IMPLANT
NS IRRIG 500ML POUR BTL (IV SOLUTION) IMPLANT
PACK EXTREMITY ARMC (MISCELLANEOUS) ×1 IMPLANT
PAD CAST 4YDX4 CTTN HI CHSV (CAST SUPPLIES) ×1 IMPLANT
PENCIL SMOKE EVACUATOR (MISCELLANEOUS) ×1 IMPLANT
STAPLER SKIN PROX 35W (STAPLE) ×1 IMPLANT
STOCKINETTE IMPERV 14X48 (MISCELLANEOUS) ×1 IMPLANT
STOCKINETTE STRL 6IN 960660 (GAUZE/BANDAGES/DRESSINGS) ×1 IMPLANT
SUT ETHILON 3 0 PS 1 (SUTURE) ×2 IMPLANT
SUT MNCRL AB 3-0 PS2 27 (SUTURE) ×1 IMPLANT
TOWEL OR 17X26 4PK STRL BLUE (TOWEL DISPOSABLE) ×1 IMPLANT

## 2023-12-24 NOTE — Anesthesia Procedure Notes (Signed)
 Procedure Name: LMA Insertion Date/Time: 12/24/2023 10:29 AM  Performed by: Karan Osgood, CRNAPre-anesthesia Checklist: Patient identified, Patient being monitored, Timeout performed, Emergency Drugs available and Suction available Patient Re-evaluated:Patient Re-evaluated prior to induction Oxygen Delivery Method: Circle system utilized Preoxygenation: Pre-oxygenation with 100% oxygen Induction Type: IV induction Ventilation: Mask ventilation without difficulty LMA: LMA inserted Tube type: Oral Number of attempts: 1 Placement Confirmation: positive ETCO2 and breath sounds checked- equal and bilateral Tube secured with: Tape Dental Injury: Teeth and Oropharynx as per pre-operative assessment

## 2023-12-24 NOTE — Anesthesia Postprocedure Evaluation (Signed)
 Anesthesia Post Note  Patient: Tiffany Coleman  Procedure(s) Performed: RIGHT FIRST METATARSOPHALANGEAL JOINT ARTHROPLASTY WITH SILASTIC IMPLANT (Right: Foot)  Patient location during evaluation: PACU Anesthesia Type: General Level of consciousness: awake and alert Pain management: pain level controlled Vital Signs Assessment: post-procedure vital signs reviewed and stable Respiratory status: spontaneous breathing, nonlabored ventilation, respiratory function stable and patient connected to nasal cannula oxygen Cardiovascular status: blood pressure returned to baseline and stable Postop Assessment: no apparent nausea or vomiting Anesthetic complications: no  No notable events documented.   Last Vitals:  Vitals:   12/24/23 1230 12/24/23 1245  BP: (!) 144/75 132/73  Pulse: 72 60  Resp: (!) 26 13  Temp:    SpO2: 100% 100%    Last Pain:  Vitals:   12/24/23 1245  TempSrc:   PainSc: Asleep                 Enrique Harvest

## 2023-12-24 NOTE — Transfer of Care (Signed)
 Immediate Anesthesia Transfer of Care Note  Patient: Tiffany Coleman  Procedure(s) Performed: RIGHT FIRST METATARSOPHALANGEAL JOINT ARTHROPLASTY WITH SILASTIC IMPLANT (Right: Foot)  Patient Location: PACU  Anesthesia Type:General  Level of Consciousness: awake, alert , and oriented  Airway & Oxygen Therapy: Patient Spontanous Breathing and Patient connected to face mask oxygen  Post-op Assessment: Report given to RN and Post -op Vital signs reviewed and stable  Post vital signs: Reviewed and stable  Last Vitals:  Vitals Value Taken Time  BP 138/69 12/24/23 1221  Temp 36.5 C 12/24/23 1217  Pulse 64 12/24/23 1222  Resp 17 12/24/23 1222  SpO2 100 % 12/24/23 1222  Vitals shown include unfiled device data.  Last Pain:  Vitals:   12/24/23 1103  TempSrc: Temporal  PainSc: 0-No pain      Patients Stated Pain Goal: 0 (12/24/23 1103)  Complications: No notable events documented.

## 2023-12-24 NOTE — Anesthesia Preprocedure Evaluation (Signed)
 Anesthesia Evaluation  Patient identified by MRN, date of birth, ID band Patient awake    Reviewed: Allergy & Precautions, H&P , NPO status , Patient's Chart, lab work & pertinent test results  History of Anesthesia Complications Negative for: history of anesthetic complications  Airway Mallampati: II  TM Distance: >3 FB Neck ROM: Full    Dental no notable dental hx. (+) Partial Lower, Partial Upper, Dental Advisory Given   Pulmonary neg pulmonary ROS, asthma , sleep apnea , Current Smoker and Patient abstained from smoking., former smoker   Pulmonary exam normal breath sounds clear to auscultation       Cardiovascular hypertension, +CHF  negative cardio ROS Normal cardiovascular exam Rhythm:Regular Rate:Normal     Neuro/Psych   Anxiety     negative neurological ROS  negative psych ROS   GI/Hepatic negative GI ROS, Neg liver ROS,GERD  Medicated,,  Endo/Other  negative endocrine ROSdiabetes    Renal/GU      Musculoskeletal   Abdominal   Peds  Hematology negative hematology ROS (+) Blood dyscrasia, anemia   Anesthesia Other Findings Past Medical History: yrs ago: Anemia No date: Anxiety No date: Arthritis     Comment:  knees No date: Bronchitis     Comment:  hx No date: CHF (congestive heart failure) (HCC) No date: Complication of anesthesia     Comment:  trouble breathing when wake up needs breathing tx when               wakes up No date: DM (diabetes mellitus), type 2 (HCC) No date: GERD (gastroesophageal reflux disease) No date: Hallux rigidus, right foot No date: History of COVID-19 2015: Hx of colonic polyps No date: Interstitial lung disease (HCC) No date: Morbid obesity (HCC) 06/02/2016: Obstructive sleep apnea     Comment:  No CPAP used No date: Oxygen dependent     Comment:  2 L [place] No date: Pneumonitis     Comment:  due to covid in 2021 09/08/2013: Recurrent ventral hernia No date:  Umbilical hernia  Past Surgical History: No date: ABDOMINAL HYSTERECTOMY     Comment:  partial No date: BREAST SURGERY; Bilateral     Comment:  cyst removed No date: COLON SURGERY 05/29/2017: COLONOSCOPY WITH PROPOFOL ; N/A     Comment:  Procedure: COLONOSCOPY WITH PROPOFOL ;  Surgeon:               Baldo Bonds, MD;  Location: [facility];  Service:              Endoscopy;  Laterality: N/A; 05/22/2023: COLONOSCOPY WITH PROPOFOL ; N/A     Comment:  Procedure: COLONOSCOPY WITH PROPOFOL ;  Surgeon:               Baldo Bonds, MD;  Location: [facility];  Service:              Gastroenterology;  Laterality: N/A; No date: FINGER SURGERY     Comment:  right pinkie x 3 No date: HERNIA REPAIR 2017: KNEE SURGERY; Right ~2004: PARTIAL COLECTOMY; Left     Comment:  for obstruction 05/22/2023: POLYPECTOMY     Comment:  Procedure: POLYPECTOMY;  Surgeon: Baldo Bonds, MD;              Location: [facility];  Service: Gastroenterology;; yrs ago: right foot surgery 05/20/2021: RIGHT HEART CATH; N/A     Comment:  Procedure: RIGHT HEART CATH;  Surgeon: Darlis Eisenmenger,  MD;  Location: [facility];  Service:               Cardiovascular;  Laterality: N/A; No date: SHOULDER ARTHROSCOPY; Right No date: stomach tumur     Comment:  [id number] benign  BMI    Body Mass Index: 30.51 kg/m      Reproductive/Obstetrics negative OB ROS                             Anesthesia Physical Anesthesia Plan  ASA: 3  Anesthesia Plan: General   Post-op Pain Management:    Induction: Intravenous  PONV Risk Score and Plan: 2 and Ondansetron , Dexamethasone  and Midazolam  Airway Management Planned: LMA  Additional Equipment:   Intra-op Plan:   Post-operative Plan: Extubation in OR  Informed Consent: I have reviewed the patients History and Physical, chart, labs and discussed the procedure including the risks, benefits and alternatives  for the proposed anesthesia with the patient or authorized representative who has indicated his/her understanding and acceptance.     Dental Advisory Given  Plan Discussed with: Anesthesiologist, CRNA and Surgeon  Anesthesia Plan Comments: (Patient consented for risks of anesthesia including but not limited to:  - adverse reactions to medications - damage to eyes, teeth, lips or other oral mucosa - nerve damage due to positioning  - sore throat or hoarseness - Damage to heart, brain, nerves, lungs, other parts of body or loss of life  Patient voiced understanding and assent.)       Anesthesia Quick Evaluation

## 2023-12-24 NOTE — Addendum Note (Signed)
 Addendum  created 12/24/23 1345 by Evana Runnels, MD   Intraprocedure Staff edited

## 2023-12-24 NOTE — Interval H&P Note (Signed)
 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  The various methods of treatment have been discussed with the patient and family. After consideration of risks, benefits and other options for treatment, the patient has consented to  Procedure(s) with comments: CHEILECTOMY, GREAT TOE, WITH IMPLANT INSERTION (Right) - KELLER BUNION IMPLANT as a surgical intervention.  The patient's history has been reviewed, patient examined, no change in status, stable for surgery.  I have reviewed the patient's chart and labs.  Questions were answered to the patient's satisfaction.     Velma Ghazi

## 2023-12-26 ENCOUNTER — Ambulatory Visit: Payer: Self-pay | Admitting: Pulmonary Disease

## 2023-12-26 ENCOUNTER — Encounter: Payer: Self-pay | Admitting: Podiatry

## 2023-12-26 ENCOUNTER — Telehealth: Payer: Self-pay | Admitting: Podiatry

## 2023-12-26 NOTE — Op Note (Signed)
 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 female presents with painful right first metatarsophalangeal joint. Patient has failed all conservative therapy including but not limited to shoe gear modification padding protecting offloading. She wishes to have surgical correction of the foot/deformity. It was determined that patient would benefit from right first metatarsophalangeal joint arthroplasty with Silastic implant. Informed surgical risk consent was reviewed and read aloud to the patient.  I reviewed the films.  I have discussed my findings with the patient in great detail.  I have discussed all risks including but not limited to infection, stiffness, scarring, limp, disability, deformity, damage to blood vessels and nerves, numbness, poor healing, need for braces, arthritis, chronic pain, amputation, death.  All benefits and realistic expectations discussed in great detail.  I have made no promises as to the outcome.  I have provided realistic expectations.  I have offered the patient a 2nd opinion, which they have declined and assured me they preferred to proceed despite the risks   Procedure in detail: The patient was both verbally and visually identified by myself, the nursing staff, and anesthesia staff in the preoperative holding area. They were then transferred to  the operating room and placed on the operative table in supine position.  A 6 cm linear longitudinal skin incision was planned and made overlying the first metatarsal phalangeal joint of the respective foot. Incision was carried down the level of joint capsule with care taken to cut, ligated or retracted way all small neurovascular structures traversing the incision site. At this time a capsulotomy was performed to expose the underlying joint. Soft tissue dissection was achieved in preparation for the joint arthroplasty. A cut guide was placed within the joint and osteotomies performed of both the head of the first metatarsal as well as the base the proximal phalanx. At this time broaching was performed to allow placement of the stems of the distal proximal portions of the Silastic implant.. Intraoperative x-ray fluoroscopy was utilized to be as well as the placement of the implant which was satisfactory. The incision site was then copiously irrigated with normal saline and dried in preparation for routine layered soft tissue closure. 3-0 Vicryl sutures utilized to reapproximate subcutaneous and capsular tissue followed by standstill skin staples to reapproximate superficial skin edges.   At the conclusion of the procedure the patient was awoken from anesthesia and found to have tolerated the procedure well any complications. There were transferred to PACU with vital signs stable and vascular status intact.  Murphy Duzan, DPM

## 2023-12-26 NOTE — Telephone Encounter (Signed)
 Patient called, would like to get instructions on when she is supposed to walk on foot, she stated she recently had to get out of bed to walk and is concerned that she was not suppose to walk until instructed by provider. Please contact patient at [phone]

## 2023-12-27 ENCOUNTER — Encounter: Payer: Self-pay | Admitting: Podiatry

## 2024-01-02 ENCOUNTER — Ambulatory Visit (INDEPENDENT_AMBULATORY_CARE_PROVIDER_SITE_OTHER): Admitting: Podiatry

## 2024-01-02 ENCOUNTER — Ambulatory Visit (INDEPENDENT_AMBULATORY_CARE_PROVIDER_SITE_OTHER)

## 2024-01-02 ENCOUNTER — Encounter: Admitting: Podiatry

## 2024-01-02 DIAGNOSIS — Z9889 Other specified postprocedural states: Secondary | ICD-10-CM

## 2024-01-02 DIAGNOSIS — M19071 Primary osteoarthritis, right ankle and foot: Secondary | ICD-10-CM | POA: Diagnosis not present

## 2024-01-02 NOTE — Progress Notes (Signed)
 Subjective:  Patient ID: Tiffany Coleman, female    DOB: 1955-12-31,  MRN: [id number]  Chief Complaint  Patient presents with   Routine Post Op    RM 11 POV # 1 DOS 12/24/23 RT 1ST METATARSSPHELANGEL JOINT SILASTIC IMPLANT ARTHROPLASTY. Pt states no feeling on the inner side of the right foot. Sutures look good no drainage or signs of infection.    DOS: 12/24/2023 Procedure: Right first metatarsophalangeal joint arthroplasty  68 y.o. female returns for post-op check.  Patient states that she is doing well denies any other acute complaints bandages clean dry and intact.  Pain is controlled.  Review of Systems: Negative except as noted in the HPI. Denies N/V/F/Ch.  Past Medical History:  Diagnosis Date   Anemia yrs ago   Anxiety    Arthritis    knees   Bronchitis    hx   CHF (congestive heart failure) (HCC)    Complication of anesthesia    trouble breathing when wake up needs breathing tx when wakes up   DM (diabetes mellitus), type 2 (HCC)    GERD (gastroesophageal reflux disease)    Hallux rigidus, right foot    History of COVID-19    Hx of colonic polyps 2015   Interstitial lung disease (HCC)    Morbid obesity (HCC)    Obstructive sleep apnea 06/02/2016   No CPAP used   Oxygen dependent    2 L [place]   Pneumonitis    due to covid in 2021   Recurrent ventral hernia 09/08/2013   Umbilical hernia     Current Outpatient Medications:    albuterol  (VENTOLIN  HFA) 108 (90 Base) MCG/ACT inhaler, Inhale 2 puffs into the lungs every 6 (six) hours as needed for wheezing or shortness of breath., Disp: 1 each, Rfl: 2   azaTHIOprine  (IMURAN ) 50 MG tablet, TAKE 3 TABLETS(150 MG) BY MOUTH DAILY (Patient taking differently: Take 150 mg by mouth daily.), Disp: 90 tablet, Rfl: 5   fluticasone  (FLONASE ) 50 MCG/ACT nasal spray, Place 2 sprays into both nostrils daily., Disp: , Rfl: 2   furosemide  (LASIX ) 20 MG tablet, Take 1 tablet (20 mg total) by mouth 2 (two) times daily. NEEDS FOLLOW UP  APPOINTMENT FOR MORE REFILLS, Disp: 90 tablet, Rfl: 0   hydrOXYzine (ATARAX) 25 MG tablet, Take 25 mg by mouth 2 (two) times daily as needed. for anxiety, Disp: , Rfl:    ibuprofen  (ADVIL ) 800 MG tablet, Take 1 tablet (800 mg total) by mouth 3 (three) times daily., Disp: 21 tablet, Rfl: 0   ibuprofen  (ADVIL ) 800 MG tablet, Take 1 tablet (800 mg total) by mouth every 6 (six) hours as needed., Disp: 60 tablet, Rfl: 1   ipratropium-albuterol  (DUONEB) 0.5-2.5 (3) MG/3ML SOLN, Take 3 mLs by nebulization every 6 (six) hours as needed., Disp: 360 mL, Rfl: 5   LINZESS 290 MCG CAPS capsule, Take 290 mcg by mouth every morning., Disp: , Rfl:    oxyCODONE -acetaminophen  (PERCOCET) 5-325 MG tablet, Take 1 tablet by mouth every 4 (four) hours as needed for severe pain (pain score 7-10)., Disp: 30 tablet, Rfl: 0   OXYGEN, Inhale 2 L into the lungs continuous., Disp: , Rfl:    pantoprazole  (PROTONIX ) 40 MG tablet, Take 40 mg by mouth every morning., Disp: , Rfl:    rosuvastatin (CRESTOR) 10 MG tablet, Take 1 tablet by mouth daily., Disp: , Rfl:    tirzepatide (MOUNJARO) 5 MG/0.5ML Pen, Inject 5 mg into the skin once a week. Tuesdays, Disp: ,  Rfl:    zolpidem  (AMBIEN ) 10 MG tablet, Take 10 mg by mouth at bedtime as needed for sleep., Disp: , Rfl:   Social History   Tobacco Use  Smoking Status Some Days   Current packs/day: 0.00   Types: Cigarettes   Start date: 05/26/2000   Last attempt to quit: 05/26/2020   Years since quitting: 3.6   Passive exposure: Past  Smokeless Tobacco Never    Allergies  Allergen Reactions   Latex Hives and Itching    Gloves with power   Other Itching    When take blood the material they put around arm causes itching   Tape Hives, Itching and Other (See Comments)    Coban wrap, in particular   Objective:  There were no vitals filed for this visit. There is no height or weight on file to calculate BMI. Constitutional Well developed. Well nourished.  Vascular Foot warm  and well perfused. Capillary refill normal to all digits.   Neurologic Normal speech. Oriented to person, place, and time. Epicritic sensation to light touch grossly present bilaterally.  Dermatologic Skin healing well without signs of infection. Skin edges well coapted without signs of infection.  Orthopedic: Tenderness to palpation noted about the surgical site.   Radiographs: 3 views of skeletally mature the right foot: Arthroplasty of the first metatarsophalangeal joint noted.  No complication noted good position noted Assessment:   1. Arthritis of first metatarsophalangeal (MTP) joint of right foot   2. Status post foot surgery    Plan:  Patient was evaluated and treated and all questions answered.  S/p foot surgery right -Progressing as expected post-operatively. -XR: See above -WB Status: Weightbearing as tolerated in cam boot -Sutures: Intact.  No clinical signs of dehiscence noted no complication noted. -Medications: None -Foot redressed.  No follow-ups on file.

## 2024-01-09 ENCOUNTER — Encounter: Admitting: Podiatry

## 2024-01-16 ENCOUNTER — Ambulatory Visit (INDEPENDENT_AMBULATORY_CARE_PROVIDER_SITE_OTHER): Admitting: Podiatry

## 2024-01-16 DIAGNOSIS — M19071 Primary osteoarthritis, right ankle and foot: Secondary | ICD-10-CM

## 2024-01-16 DIAGNOSIS — Z9889 Other specified postprocedural states: Secondary | ICD-10-CM

## 2024-01-16 NOTE — Progress Notes (Signed)
 Subjective:  Patient ID: Tiffany Coleman, female    DOB: 05-14-1956,  MRN: [id number]  Chief Complaint  Patient presents with   Routine Post Op    POV # 2 DOS 12/24/23 RT 1ST METATARSSPHELANGEL JOINT SILASTIC IMPLANT ARTHROPLASTY    DOS: 12/24/2023 Procedure: Right first metatarsophalangeal joint arthroplasty  68 y.o. female returns for post-op check.  Patient states that she is doing well denies any other acute complaints bandages clean dry and intact.  No further pain noted  Review of Systems: Negative except as noted in the HPI. Denies N/V/F/Ch.  Past Medical History:  Diagnosis Date   Anemia yrs ago   Anxiety    Arthritis    knees   Bronchitis    hx   CHF (congestive heart failure) (HCC)    Complication of anesthesia    trouble breathing when wake up needs breathing tx when wakes up   DM (diabetes mellitus), type 2 (HCC)    GERD (gastroesophageal reflux disease)    Hallux rigidus, right foot    History of COVID-19    Hx of colonic polyps 2015   Interstitial lung disease (HCC)    Morbid obesity (HCC)    Obstructive sleep apnea 06/02/2016   No CPAP used   Oxygen dependent    2 L [place]   Pneumonitis    due to covid in 2021   Recurrent ventral hernia 09/08/2013   Umbilical hernia     Current Outpatient Medications:    albuterol  (VENTOLIN  HFA) 108 (90 Base) MCG/ACT inhaler, Inhale 2 puffs into the lungs every 6 (six) hours as needed for wheezing or shortness of breath., Disp: 1 each, Rfl: 2   azaTHIOprine  (IMURAN ) 50 MG tablet, TAKE 3 TABLETS(150 MG) BY MOUTH DAILY (Patient taking differently: Take 150 mg by mouth daily.), Disp: 90 tablet, Rfl: 5   fluticasone  (FLONASE ) 50 MCG/ACT nasal spray, Place 2 sprays into both nostrils daily., Disp: , Rfl: 2   furosemide  (LASIX ) 20 MG tablet, Take 1 tablet (20 mg total) by mouth 2 (two) times daily. NEEDS FOLLOW UP APPOINTMENT FOR MORE REFILLS, Disp: 90 tablet, Rfl: 0   hydrOXYzine (ATARAX) 25 MG tablet, Take 25 mg by mouth 2  (two) times daily as needed. for anxiety, Disp: , Rfl:    ibuprofen  (ADVIL ) 800 MG tablet, Take 1 tablet (800 mg total) by mouth 3 (three) times daily., Disp: 21 tablet, Rfl: 0   ibuprofen  (ADVIL ) 800 MG tablet, Take 1 tablet (800 mg total) by mouth every 6 (six) hours as needed., Disp: 60 tablet, Rfl: 1   ipratropium-albuterol  (DUONEB) 0.5-2.5 (3) MG/3ML SOLN, Take 3 mLs by nebulization every 6 (six) hours as needed., Disp: 360 mL, Rfl: 5   LINZESS 290 MCG CAPS capsule, Take 290 mcg by mouth every morning., Disp: , Rfl:    oxyCODONE -acetaminophen  (PERCOCET) 5-325 MG tablet, Take 1 tablet by mouth every 4 (four) hours as needed for severe pain (pain score 7-10)., Disp: 30 tablet, Rfl: 0   OXYGEN, Inhale 2 L into the lungs continuous., Disp: , Rfl:    pantoprazole  (PROTONIX ) 40 MG tablet, Take 40 mg by mouth every morning., Disp: , Rfl:    rosuvastatin (CRESTOR) 10 MG tablet, Take 1 tablet by mouth daily., Disp: , Rfl:    tirzepatide (MOUNJARO) 5 MG/0.5ML Pen, Inject 5 mg into the skin once a week. Tuesdays, Disp: , Rfl:    zolpidem  (AMBIEN ) 10 MG tablet, Take 10 mg by mouth at bedtime as needed for sleep., Disp: ,  Rfl:   Social History   Tobacco Use  Smoking Status Some Days   Current packs/day: 0.00   Types: Cigarettes   Start date: 05/26/2000   Last attempt to quit: 05/26/2020   Years since quitting: 3.6   Passive exposure: Past  Smokeless Tobacco Never    Allergies  Allergen Reactions   Latex Hives and Itching    Gloves with power   Other Itching    When take blood the material they put around arm causes itching   Tape Hives, Itching and Other (See Comments)    Coban wrap, in particular   Objective:  There were no vitals filed for this visit. There is no height or weight on file to calculate BMI. Constitutional Well developed. Well nourished.  Vascular Foot warm and well perfused. Capillary refill normal to all digits.   Neurologic Normal speech. Oriented to person,  place, and time. Epicritic sensation to light touch grossly present bilaterally.  Dermatologic Skin completely epithelialized.  No signs of Deis is noted.  Good range of motion noted the first metatarsophalangeal joint.  No pain with range of motion  Orthopedic: No further tenderness to palpation noted about the surgical site.   Radiographs: 3 views of skeletally mature the right foot: Arthroplasty of the first metatarsophalangeal joint noted.  No complication noted good position noted Assessment:   1. Arthritis of first metatarsophalangeal (MTP) joint of right foot   2. Status post foot surgery     Plan:  Patient was evaluated and treated and all questions answered.  S/p foot surgery right - Clinically healed completely epithelialized.  Good range of motion noted of first metatarsophalangeal joint.  No signs of dehiscence noted no signs of infection noted at this time patient is officially discharged from my care if any foot and ankle issues or in the future she will come back and see me.  No follow-ups on file.

## 2024-01-23 ENCOUNTER — Encounter: Admitting: Podiatry

## 2024-02-08 ENCOUNTER — Other Ambulatory Visit: Payer: Self-pay | Admitting: Podiatry

## 2024-02-28 ENCOUNTER — Ambulatory Visit: Admitting: Dermatology

## 2024-02-28 DIAGNOSIS — L738 Other specified follicular disorders: Secondary | ICD-10-CM

## 2024-02-28 DIAGNOSIS — L710 Perioral dermatitis: Secondary | ICD-10-CM

## 2024-02-28 DIAGNOSIS — L720 Epidermal cyst: Secondary | ICD-10-CM

## 2024-02-28 DIAGNOSIS — L700 Acne vulgaris: Secondary | ICD-10-CM

## 2024-02-28 DIAGNOSIS — L821 Other seborrheic keratosis: Secondary | ICD-10-CM | POA: Diagnosis not present

## 2024-02-28 DIAGNOSIS — D239 Other benign neoplasm of skin, unspecified: Secondary | ICD-10-CM

## 2024-02-28 MED ORDER — METRONIDAZOLE 0.75 % EX CREA: TOPICAL_CREAM | Freq: Two times a day (BID) | CUTANEOUS | 1 refills | Status: AC

## 2024-02-28 NOTE — Patient Instructions (Addendum)
 Pre-Operative Instructions You are scheduled for a surgical procedure at [facility]. We recommend you read the following instructions. If you have any questions or concerns, please call the office at [phone].  Shower and wash the entire body with soap and water the day of your surgery paying special attention to cleansing at and around the planned surgery site.  Please continue to take your anticoagulants (blood thinners) as you normally     would before and after surgery if they were prescribed by a medical provider. Stopping them could be harmful to you. We have multiple tools in dermatology to stop the bleeding even if you take an anticoagulant. If you take over the counter blood thinner such as aspirin, Ibuprofen (Motrin, Advil and Nuprin), Naprosyn , Voltaren , Relafen, etc. that was not prescribed or recommended by a medical provider, we recommend that you stop taking it for a week before your surgery and wait to restart until 2 days after your surgery.  Please inform us  of all medications you are currently taking. All medications that are taken regularly should be taken the day of surgery as you always do. Nevertheless, we need to be informed of what medications you are taking prior to surgery to know whether they will affect the procedure or cause any complications.   Please inform us  of any medication allergies. Also inform us  of whether you have allergies to Latex or rubber products or whether you have had any adverse reaction to Lidocaine  or Epinephrine .  Please inform us  of any prosthetic or artificial body parts such as artificial heart valve, joint replacements, etc., or similar condition that might require preoperative antibiotics.   We recommend avoidance of alcohol at least two weeks prior to surgery and continued avoidance for at least two weeks after surgery.   We recommend discontinuation of tobacco smoking at least two weeks prior to surgery and continued  abstinence for at least two weeks after surgery.  Do not plan strenuous exercise, strenuous work or strenuous lifting for approximately four weeks after your surgery.   We request if you are unable to make your scheduled surgical appointment, please call us  at least a week in advance or as soon as you are aware of a problem so that we can cancel or reschedule the appointment.   You MAY TAKE TYLENOL  (acetaminophen ) for pain as it is not a blood thinner.   PLEASE PLAN TO BE IN TOWN FOR TWO WEEKS FOLLOWING SURGERY, THIS IS IMPORTANT SO YOU CAN BE CHECKED FOR DRESSING CHANGES, FUTURE REMOVAL AND TO MONITOR FOR POSSIBLE COMPLICATIONS.   Due to recent changes in healthcare laws, you may see results of your pathology and/or laboratory studies on MyChart before the doctors have had a chance to review them. We understand that in some cases there may be results that are confusing or concerning to you. Please understand that not all results are received at the same time and often the doctors may need to interpret multiple results in order to provide you with the best plan of care or course of treatment. Therefore, we ask that you please give us  2 business days to thoroughly review all your results before contacting the office for clarification. Should we see a critical lab result, you will be contacted sooner.   If You Need Anything After Your Visit  If you have any questions or concerns for your doctor, please call our main line at [phone] and press option 4 to reach your doctor's medical assistant. If no  one answers, please leave a voicemail as directed and we will return your call as soon as possible. Messages left after 4 pm will be answered the following business day.   You may also send us  a message via MyChart. We typically respond to MyChart messages within 1-2 business days.  For prescription refills, please ask your pharmacy to contact our office. Our fax number is [phone].  If you have  an urgent issue when the clinic is closed that cannot wait until the next business day, you can page your doctor at the number below.    Please note that while we do our best to be available for urgent issues outside of office hours, we are not available 24/7.   If you have an urgent issue and are unable to reach us , you may choose to seek medical care at your doctor's office, retail clinic, urgent care center, or emergency room.  If you have a medical emergency, please immediately call 911 or go to the emergency department.  Pager Numbers  - Dr. Hester: [phone]  - Dr. Jackquline: [phone]  - Dr. Claudene: [phone]   In the event of inclement weather, please call our main line at [phone] for an update on the status of any delays or closures.  Dermatology Medication Tips: Please keep the boxes that topical medications come in in order to help keep track of the instructions about where and how to use these. Pharmacies typically print the medication instructions only on the boxes and not directly on the medication tubes.   If your medication is too expensive, please contact our office at [phone] option 4 or send us  a message through MyChart.   We are unable to tell what your co-pay for medications will be in advance as this is different depending on your insurance coverage. However, we may be able to find a substitute medication at lower cost or fill out paperwork to get insurance to cover a needed medication.   If a prior authorization is required to get your medication covered by your insurance company, please allow us  1-2 business days to complete this process.  Drug prices often vary depending on where the prescription is filled and some pharmacies may offer cheaper prices.  The website www.goodrx.com contains coupons for medications through different pharmacies. The prices here do not account for what the cost may be with help from insurance (it may be cheaper with  your insurance), but the website can give you the price if you did not use any insurance.  - You can print the associated coupon and take it with your prescription to the pharmacy.  - You may also stop by our office during regular business hours and pick up a GoodRx coupon card.  - If you need your prescription sent electronically to a different pharmacy, notify our office through [facility] or by phone at [phone] option 4.     Si Usted Necesita Algo Despus de Su Visita  Tambin puede enviarnos un mensaje a travs de Clinical cytogeneticist. Por lo general respondemos a los mensajes de MyChart en el transcurso de 1 a 2 das hbiles.  Para renovar recetas, por favor pida a su farmacia que se ponga en contacto con nuestra oficina. Randi lakes de fax es [place] [phone].  Si tiene un asunto urgente cuando la clnica est cerrada y que no puede esperar hasta el siguiente da hbil, puede llamar/localizar a su doctor(a) al nmero que aparece a continuacin.   Por favor, tenga en  cuenta que aunque hacemos todo lo posible para estar disponibles para asuntos urgentes fuera del horario de [place], no estamos disponibles las 24 horas del da, los 7 [address]  [address] de la [place].   Si tiene un problema urgente y no puede comunicarse con nosotros, puede optar por buscar atencin mdica  en el consultorio de su doctor(a), en una clnica privada, en un centro de atencin urgente o en una sala de emergencias.  Si tiene Engineer, drilling, por favor llame inmediatamente al 911 o vaya a la sala de emergencias.  Nmeros de bper  - Dr. Hester: [phone]  - Dra. Jackquline: [phone]  - Dr. Claudene: [phone]   En caso de inclemencias del tiempo, por favor llame a landry capes principal al [phone] para una actualizacin sobre el [place] de cualquier retraso o cierre.  Consejos para la medicacin en dermatologa: Por favor, guarde las cajas en las que vienen los medicamentos de uso tpico para  ayudarle a seguir las instrucciones sobre dnde y cmo usarlos. Las farmacias generalmente imprimen las instrucciones del medicamento slo en las cajas y no directamente en los tubos del [employer].   Si su medicamento es muy caro, por favor, pngase en contacto con landry rieger llamando al [phone] y presione la opcin 4 o envenos un mensaje a travs de Clinical cytogeneticist.   No podemos decirle cul ser su copago por los medicamentos por adelantado ya que esto es diferente dependiendo de la cobertura de su seguro. Sin embargo, es posible que podamos encontrar un medicamento sustituto a Audiological scientist un formulario para que el seguro cubra el medicamento que se considera necesario.   Si se requiere una autorizacin previa para que su compaa de seguros malta su medicamento, por favor permtanos de 1 a 2 das hbiles para completar este proceso.  Los precios de los medicamentos varan con frecuencia dependiendo del Environmental consultant de dnde se surte la receta y alguna farmacias pueden ofrecer precios ms baratos.  El sitio web www.goodrx.com tiene cupones para medicamentos de Health and safety inspector. Los precios aqu no tienen en cuenta lo que podra costar con la ayuda del seguro (puede ser ms barato con su seguro), pero el sitio web puede darle el precio si no utiliz Tourist information centre manager.  - Puede imprimir el cupn correspondiente y llevarlo con su receta a la farmacia.  - Tambin puede pasar por nuestra oficina durante el horario de atencin regular y Education officer, museum una tarjeta de cupones de GoodRx.  - Si necesita que su receta se enve electrnicamente a una farmacia diferente, informe a nuestra oficina a travs de MyChart de [place] o por telfono llamando al [phone] y presione la opcin 4.

## 2024-02-28 NOTE — Progress Notes (Unsigned)
   Follow-Up Visit   Subjective  Tiffany Coleman is a 68 y.o. female who presents for the following: Knot at R forearm, some pain in it, is growing.   Patient also reports face stinging, breaking out, blackheads, face feels itchy. Has not been applying anything. No new products.    The following portions of the chart were reviewed this encounter and updated as appropriate: medications, allergies, medical history  Review of Systems:  No other skin or systemic complaints except as noted in HPI or Assessment and Plan.  Objective  Well appearing patient in no apparent distress; mood and affect are within normal limits.  A focused examination was performed of the following areas: Face, R forearm   Relevant exam findings are noted in the Assessment and Plan.  Face Skin-coloured to erythematous papules on perioral skin Left Zygomatic Area, forehead Open comedones on forehead, L brow, L cheek x 2  Assessment & Plan   EPIDERMAL INCLUSION CYST Exam: Subcutaneous nodule at right forearm   Benign-appearing. Exam most consistent with an epidermal inclusion cyst. Discussed that a cyst is a benign growth that can grow over time and sometimes get irritated or inflamed. Recommend observation if it is not bothersome. Discussed option of surgical excision to remove it if it is growing, symptomatic, or other changes noted. Please call for new or changing lesions so they can be evaluated.  Patient will schedule excision, has a trip planned so will schedule after her trip.   SEBORRHEIC KERATOSIS - Stuck-on, waxy, tan-brown papules and/or plaques at face neck back - Benign-appearing - Discussed benign etiology and prognosis. - Observe - Call for any changes  PERIORIFICIAL DERMATITIS Face Start metronidazole  0.75% cream, apply twice a day to affected areas at face.   Can also be triggered by wearing a mask.   Discussed if wanting to test for possible dog allergy would refer to allergist  for testing.  DILATED PORE OF WINER Left Zygomatic Area, forehead Prepped with alcohol and used comedone extractor to partially remove sebum EIC (EPIDERMAL INCLUSION CYST)   OPEN COMEDONE   SEBORRHEIC KERATOSES    Return for w/ Dr. Claudene for surgery.  I, Tiffany Coleman, Tiffany Coleman, am acting as scribe for Tiffany Claudene, MD .   Documentation: I have reviewed the above documentation for accuracy and completeness, and I agree with the above.  Tiffany Claudene, MD

## 2024-02-29 ENCOUNTER — Encounter: Payer: Self-pay | Admitting: Dermatology

## 2024-04-12 ENCOUNTER — Encounter (HOSPITAL_BASED_OUTPATIENT_CLINIC_OR_DEPARTMENT_OTHER): Payer: Self-pay | Admitting: Emergency Medicine

## 2024-04-12 ENCOUNTER — Other Ambulatory Visit: Payer: Self-pay

## 2024-04-12 ENCOUNTER — Emergency Department (HOSPITAL_BASED_OUTPATIENT_CLINIC_OR_DEPARTMENT_OTHER)

## 2024-04-12 ENCOUNTER — Emergency Department (HOSPITAL_BASED_OUTPATIENT_CLINIC_OR_DEPARTMENT_OTHER)
Admission: EM | Admit: 2024-04-12 | Discharge: 2024-04-12 | Disposition: A | Discharge status: Home / Self Care | Attending: Emergency Medicine | Admitting: Emergency Medicine

## 2024-04-12 DIAGNOSIS — I509 Heart failure, unspecified: Secondary | ICD-10-CM | POA: Diagnosis not present

## 2024-04-12 DIAGNOSIS — R109 Unspecified abdominal pain: Secondary | ICD-10-CM | POA: Diagnosis present

## 2024-04-12 DIAGNOSIS — R7989 Other specified abnormal findings of blood chemistry: Secondary | ICD-10-CM | POA: Diagnosis not present

## 2024-04-12 DIAGNOSIS — E119 Type 2 diabetes mellitus without complications: Secondary | ICD-10-CM | POA: Insufficient documentation

## 2024-04-12 DIAGNOSIS — R1030 Lower abdominal pain, unspecified: Secondary | ICD-10-CM

## 2024-04-12 DIAGNOSIS — Z91040 Latex allergy status: Secondary | ICD-10-CM | POA: Diagnosis not present

## 2024-04-12 DIAGNOSIS — R1032 Left lower quadrant pain: Secondary | ICD-10-CM | POA: Diagnosis not present

## 2024-04-12 DIAGNOSIS — J189 Pneumonia, unspecified organism: Secondary | ICD-10-CM

## 2024-04-12 LAB — CBC WITH DIFFERENTIAL/PLATELET
Abs Immature Granulocytes: 0.01 K/uL (ref 0.00–0.07)
Basophils Absolute: 0 K/uL (ref 0.0–0.1)
Basophils Relative: 1 %
Eosinophils Absolute: 0.4 K/uL (ref 0.0–0.5)
Eosinophils Relative: 8 %
HCT: 34.8 % — ABNORMAL LOW (ref 36.0–46.0)
Hemoglobin: 11.4 g/dL — ABNORMAL LOW (ref 12.0–15.0)
Immature Granulocytes: 0 %
Lymphocytes Relative: 30 %
Lymphs Abs: 1.7 K/uL (ref 0.7–4.0)
MCH: 29.2 pg (ref 26.0–34.0)
MCHC: 32.8 g/dL (ref 30.0–36.0)
MCV: 89.2 fL (ref 80.0–100.0)
Monocytes Absolute: 0.5 K/uL (ref 0.1–1.0)
Monocytes Relative: 9 %
Neutro Abs: 2.9 K/uL (ref 1.7–7.7)
Neutrophils Relative %: 52 %
Platelets: 211 K/uL (ref 150–400)
RBC: 3.9 MIL/uL (ref 3.87–5.11)
RDW: 13.5 % (ref 11.5–15.5)
WBC: 5.5 K/uL (ref 4.0–10.5)
nRBC: 0 % (ref 0.0–0.2)

## 2024-04-12 LAB — COMPREHENSIVE METABOLIC PANEL WITH GFR
ALT: 11 U/L (ref 0–44)
AST: 19 U/L (ref 15–41)
Albumin: 4.1 g/dL (ref 3.5–5.0)
Alkaline Phosphatase: 64 U/L (ref 38–126)
Anion gap: 9 (ref 5–15)
BUN: 10 mg/dL (ref 8–23)
CO2: 28 mmol/L (ref 22–32)
Calcium: 10.2 mg/dL (ref 8.9–10.3)
Chloride: 104 mmol/L (ref 98–111)
Creatinine, Ser: 1.07 mg/dL — ABNORMAL HIGH (ref 0.44–1.00)
GFR, Estimated: 56 mL/min — ABNORMAL LOW (ref >60)
Glucose, Bld: 83 mg/dL (ref 70–99)
Potassium: 4.9 mmol/L (ref 3.5–5.1)
Sodium: 141 mmol/L (ref 135–145)
Total Bilirubin: 0.5 mg/dL (ref 0.0–1.2)
Total Protein: 6.8 g/dL (ref 6.5–8.1)

## 2024-04-12 LAB — URINALYSIS, ROUTINE W REFLEX MICROSCOPIC
Bilirubin Urine: NEGATIVE
Glucose, UA: NEGATIVE mg/dL
Hgb urine dipstick: NEGATIVE
Ketones, ur: NEGATIVE mg/dL
Leukocytes,Ua: NEGATIVE
Nitrite: NEGATIVE
Protein, ur: NEGATIVE mg/dL
Specific Gravity, Urine: 1.018 (ref 1.005–1.030)
pH: 5.5 (ref 5.0–8.0)

## 2024-04-12 LAB — LIPASE, BLOOD: Lipase: 31 U/L (ref 11–51)

## 2024-04-12 MED ORDER — AMOXICILLIN 500 MG PO CAPS: 500.0000 mg | ORAL_CAPSULE | Freq: Three times a day (TID) | ORAL | 0 refills | Status: DC

## 2024-04-12 MED ORDER — SODIUM CHLORIDE 0.9 % IV BOLUS
500.0000 mL | Freq: Once | INTRAVENOUS | Status: AC
  Administered 2024-04-12: 500 mL via INTRAVENOUS

## 2024-04-12 MED ORDER — IOHEXOL 300 MG/ML  SOLN
100.0000 mL | Freq: Once | INTRAMUSCULAR | Status: AC | PRN
  Administered 2024-04-12: 100 mL via INTRAVENOUS

## 2024-04-12 MED ORDER — AZITHROMYCIN 250 MG PO TABS: ORAL_TABLET | ORAL | 0 refills | Status: DC

## 2024-04-12 MED ORDER — MORPHINE SULFATE (PF) 4 MG/ML IV SOLN
4.0000 mg | Freq: Once | INTRAVENOUS | Status: AC
  Administered 2024-04-12: 4 mg via INTRAVENOUS
  Filled 2024-04-12: qty 1

## 2024-04-12 NOTE — ED Notes (Signed)
Pt on 2L O2 

## 2024-04-12 NOTE — ED Provider Notes (Signed)
[name] EMERGENCY DEPARTMENT AT [facility] Provider Note   CSN: [id number] Arrival date & time: 04/12/24  1746     Patient presents with: Flank Pain   Tiffany Coleman is a 68 y.o. female.   The history is provided by the patient and medical records. No language interpreter was used.  Flank Pain     68 year old female history of CHF, anemia, diabetes, GERD, colonic polyps, oxygen dependent due to interstitial lung disease presenting with complaint of abdominal pain.  Patient is for the past 3 days she has had persistent pain to her left flank that radiates to her left side abdomen.  Pain is crampy, achy, persistent worse when she moves.  Pain not adequately controlled despite taking ibuprofen  at home.  She also feels a bit dehydrated and her mouth is dry and felt a bit itchy.  She does not complain of any fever no nausea no vomiting no bowel changes.  She does notice some discomfort with urination but denies any blood in the urine.  She has normal appetite.  No history of kidney stone.  Prior to Admission medications   Medication Sig Start Date End Date Taking? Authorizing Provider  albuterol  (VENTOLIN  HFA) 108 (90 Base) MCG/ACT inhaler Inhale 2 puffs into the lungs every 6 (six) hours as needed for wheezing or shortness of breath. 09/06/20   Parrett, Tammy S, NP  azaTHIOprine  (IMURAN ) 50 MG tablet TAKE 3 TABLETS(150 MG) BY MOUTH DAILY Patient taking differently: Take 150 mg by mouth daily. 11/21/22   [name], Praveen, MD  fluticasone  (FLONASE ) 50 MCG/ACT nasal spray Place 2 sprays into both nostrils daily. 06/01/20   Sebastian Toribio GAILS, MD  furosemide  (LASIX ) 20 MG tablet Take 1 tablet (20 mg total) by mouth 2 (two) times daily. NEEDS FOLLOW UP APPOINTMENT FOR MORE REFILLS 11/28/21   Rolan Ezra RAMAN, MD  hydrOXYzine (ATARAX) 25 MG tablet Take 25 mg by mouth 2 (two) times daily as needed. for anxiety 05/23/22   [provider]  ibuprofen  (ADVIL ) 800 MG tablet Take 1  tablet (800 mg total) by mouth 3 (three) times daily. 01/14/22   Elnor Hila P, DO  ibuprofen  (ADVIL ) 800 MG tablet TAKE 1 TABLET(800 MG) BY MOUTH EVERY 6 HOURS AS NEEDED 02/08/24   Tobie Franky SQUIBB, DPM  ipratropium-albuterol  (DUONEB) 0.5-2.5 (3) MG/3ML SOLN Take 3 mLs by nebulization every 6 (six) hours as needed. 12/14/20   [name], Praveen, MD  LINZESS 290 MCG CAPS capsule Take 290 mcg by mouth every morning.    [provider]  metroNIDAZOLE  (METROCREAM ) 0.75 % cream Apply topically 2 (two) times daily. 02/28/24 02/27/25  Claudene Lehmann, MD  oxyCODONE -acetaminophen  (PERCOCET) 5-325 MG tablet Take 1 tablet by mouth every 4 (four) hours as needed for severe pain (pain score 7-10). 12/24/23   Tobie Franky SQUIBB, DPM  OXYGEN Inhale 2 L into the lungs continuous.    [provider]  pantoprazole  (PROTONIX ) 40 MG tablet Take 40 mg by mouth every morning.    [provider]  rosuvastatin (CRESTOR) 10 MG tablet Take 1 tablet by mouth daily. 10/13/23   [provider]  tirzepatide CLOYDE) 5 MG/0.5ML Pen Inject 5 mg into the skin once a week. Tuesdays 08/08/21   [provider]  zolpidem  (AMBIEN ) 10 MG tablet Take 10 mg by mouth at bedtime as needed for sleep. 05/15/21   [provider]    Allergies: Latex, Other, and Tape    Review of Systems  Genitourinary:  Positive  for flank pain.  All other systems reviewed and are negative.   Updated Vital Signs BP (!) 119/98 (BP Location: Left Arm)   Pulse 87   Temp 98.2 F (36.8 C) (Oral)   Resp 18   SpO2 99%   Physical Exam Vitals and nursing note reviewed.  Constitutional:      General: She is not in acute distress.    Appearance: She is well-developed.     Comments: Resting calmly, wearing supplemental oxygen.  HENT:     Head: Atraumatic.  Eyes:     Conjunctiva/sclera: Conjunctivae normal.  Cardiovascular:     Rate and Rhythm: Normal rate and regular rhythm.     Pulses: Normal pulses.      Heart sounds: Normal heart sounds.  Pulmonary:     Effort: Pulmonary effort is normal.  Abdominal:     Palpations: Abdomen is soft.     Tenderness: There is abdominal tenderness (Mild tenderness to left lower quadrant without guarding or rebound tenderness.). There is no right CVA tenderness or left CVA tenderness.  Musculoskeletal:     Cervical back: Neck supple.  Skin:    Findings: No rash.  Neurological:     Mental Status: She is alert.  Psychiatric:        Mood and Affect: Mood normal.     (all labs ordered are listed, but only abnormal results are displayed) Labs Reviewed  CBC WITH DIFFERENTIAL/PLATELET - Abnormal; Notable for the following components:      Result Value   Hemoglobin 11.4 (*)    HCT 34.8 (*)    All other components within normal limits  COMPREHENSIVE METABOLIC PANEL WITH GFR - Abnormal; Notable for the following components:   Creatinine, Ser 1.07 (*)    GFR, Estimated 56 (*)    All other components within normal limits  LIPASE, BLOOD  URINALYSIS, ROUTINE W REFLEX MICROSCOPIC    EKG: None  Radiology: CT ABDOMEN PELVIS W CONTRAST Result Date: 04/12/2024 CLINICAL DATA:  Left flank pain EXAM: CT ABDOMEN AND PELVIS WITH CONTRAST TECHNIQUE: Multidetector CT imaging of the abdomen and pelvis was performed using the standard protocol following bolus administration of intravenous contrast. RADIATION DOSE REDUCTION: This exam was performed according to the departmental dose-optimization program which includes automated exposure control, adjustment of the mA and/or kV according to patient size and/or use of iterative reconstruction technique. CONTRAST:  OMNIPAQUE  IOHEXOL  300 MG/ML  SOLN COMPARISON:  CT abdomen and pelvis 03/02/2023. FINDINGS: Lower chest: There some mild patchy ground-glass opacities in both lung bases. Hepatobiliary: No focal liver abnormality is seen. No gallstones, gallbladder wall thickening, or biliary dilatation. Pancreas: Unremarkable. No  pancreatic ductal dilatation or surrounding inflammatory changes. Spleen: Normal in size without focal abnormality. Adrenals/Urinary Tract: Subcentimeter hypodensities in the kidneys are too small to characterize, likely cysts. Otherwise, the adrenal glands, kidneys and bladder are within normal limits. Stomach/Bowel: Stomach is within normal limits. Appendix appears normal. No evidence of bowel wall thickening, distention, or inflammatory changes. There are scattered colonic diverticula. Vascular/Lymphatic: Aortic atherosclerosis. No enlarged abdominal or pelvic lymph nodes. Reproductive: Status post hysterectomy. No adnexal masses. Other: No abdominal wall hernia or abnormality. No abdominopelvic ascites. Musculoskeletal: No fracture is seen. IMPRESSION: 1. No acute localizing process in the abdomen or pelvis. 2. Colonic diverticulosis without evidence for diverticulitis. 3. Mild patchy ground-glass opacities in both lung bases, likely infectious/inflammatory. Aortic Atherosclerosis (ICD10-I70.0). Electronically Signed   By: Greig Pique M.D.   On: 04/12/2024 20:38  Procedures   Medications Ordered in the ED  morphine  (PF) 4 MG/ML injection 4 mg (4 mg Intravenous Given 04/12/24 1921)  sodium chloride  0.9 % bolus 500 mL (0 mLs Intravenous Stopped 04/12/24 2041)  iohexol  (OMNIPAQUE ) 300 MG/ML solution 100 mL (100 mLs Intravenous Contrast Given 04/12/24 2028)                                    Medical Decision Making Amount and/or Complexity of Data Reviewed Labs: ordered. Radiology: ordered.  Risk Prescription drug management.   BP (!) 119/98 (BP Location: Left Arm)   Pulse 87   Temp 98.2 F (36.8 C) (Oral)   Resp 18   SpO2 99%   68:51 PM  69 year old female history of CHF, anemia, diabetes, GERD, colonic polyps, oxygen dependent due to interstitial lung disease presenting with complaint of abdominal pain.  Patient is for the past 3 days she has had persistent pain to her left flank  that radiates to her left side abdomen.  Pain is crampy, achy, persistent worse when she moves.  Pain not adequately controlled despite taking ibuprofen  at home.  She also feels a bit dehydrated and her mouth is dry and felt a bit itchy.  She does not complain of any fever no nausea no vomiting no bowel changes.  She does notice some discomfort with urination but denies any blood in the urine.  She has normal appetite.  No history of kidney stone.  On exam patient is resting comfortably appears to be in no acute discomfort.  Some tenderness to the left lower quadrant but without guarding or rebound tenderness.  No CVA tenderness.  Will obtain CT scan of the abdomen pelvis for further assessment.  Workup initiated.   -Labs ordered, independently viewed and interpreted by me.  Labs remarkable for creatinine of 1.07, IV fluids given urinalysis without signs of Uroject infection -The patient was maintained on a cardiac monitor.  I personally viewed and interpreted the cardiac monitored which showed an underlying rhythm of: Sinus rhythm -Imaging independently viewed and interpreted by me and I agree with radiologist's interpretation.  Result remarkable for abdominal pelvis CT scan without any concerning finding.  There is mild patchy ground glass opacities in both lung bases likely infectious/inflammatory.  Patient have a did not complain of any significant shortness of breath more than her usual and denies any cough therefore symptoms not consistent with pneumonia. -This patient presents to the ED for concern of lower abdominal pain, this involves an extensive number of treatment options, and is a complaint that carries with it a high risk of complications and morbidity.  The differential diagnosis includes diverticulitis, colitis, pancreatitis, appendicitis, UTI, pyelonephritis, shingles, muscle strain -Co morbidities that complicate the patient evaluation includes CHF, anemia, diabetes, GERD, interstitial  lung disease -Treatment includes morphine  -Reevaluation of the patient after these medicines showed that the patient improved -PCP office notes or outside notes reviewed -Discussion with attending Dr. Pamella -Escalation to admission/observation considered: patients feels much better, is comfortable with discharge, and will follow up with PCP -Prescription medication considered, patient comfortable with home OTC meds -Social Determinant of Health considered which includes tobacco use      Final diagnoses:  Lower abdominal pain    ED Discharge Orders     None          Nivia Colon, PA-C 04/12/24 2200    Pamella Ozell LABOR, DO 04/18/24  1139  

## 2024-04-12 NOTE — Discharge Instructions (Signed)
 You have been evaluated for your symptoms.  Fortunately no concerning findings were noted on today's exam.  CT scan of your chest did show some inflammatory changes however if you are not coughing, having fever or having increased shortness of breath it is unlikely that this is pneumonia.  If you do develop cough, fever, increase shortness of breath then please follow-up with your doctor as antibiotic for pneumonia may be beneficial.

## 2024-04-12 NOTE — ED Triage Notes (Signed)
 Left side flank pain into back X 3 days Not relieved with ibruprofen  Also reports itchy feeling

## 2024-04-23 ENCOUNTER — Encounter: Payer: Self-pay | Admitting: Dermatology

## 2024-04-23 ENCOUNTER — Ambulatory Visit (INDEPENDENT_AMBULATORY_CARE_PROVIDER_SITE_OTHER): Admitting: Dermatology

## 2024-04-23 DIAGNOSIS — L720 Epidermal cyst: Secondary | ICD-10-CM | POA: Diagnosis not present

## 2024-04-23 DIAGNOSIS — D485 Neoplasm of uncertain behavior of skin: Secondary | ICD-10-CM

## 2024-04-23 DIAGNOSIS — L942 Calcinosis cutis: Secondary | ICD-10-CM

## 2024-04-23 DIAGNOSIS — D492 Neoplasm of unspecified behavior of bone, soft tissue, and skin: Secondary | ICD-10-CM

## 2024-04-23 MED ORDER — MUPIROCIN 2 % EX OINT: 1.0000 | TOPICAL_OINTMENT | Freq: Every day | CUTANEOUS | 0 refills | Status: AC

## 2024-04-23 NOTE — Patient Instructions (Signed)

## 2024-04-23 NOTE — Progress Notes (Unsigned)
   Follow-Up Visit   Subjective  Tiffany Coleman is a 68 y.o. female who presents for the following: Excision of EIC at right forearm  The following portions of the chart were reviewed this encounter and updated as appropriate: medications, allergies, medical history  Review of Systems:  No other skin or systemic complaints except as noted in HPI or Assessment and Plan.  Objective  Well appearing patient in no apparent distress; mood and affect are within normal limits.  A focused examination was performed of the following areas: Right arm Relevant physical exam findings are noted in the Assessment and Plan.   Right Forearm Subcutaneous nodule. 1.5 cm  Assessment & Plan   NEOPLASM OF UNSPECIFIED BEHAVIOR OF BONE, SOFT TISSUE, AND SKIN Right Forearm Skin excision  Excision method:  elliptical Total excision diameter (cm):  1.5 Informed consent: discussed and consent obtained   Timeout: patient name, date of birth, surgical site, and procedure verified   Procedure prep:  Patient was prepped and draped in usual sterile fashion Prep type:  Chlorhexidine  Anesthesia: the lesion was anesthetized in a standard fashion   Anesthetic:  1% lidocaine  w/ epinephrine  1-100,000 buffered w/ 8.4% NaHCO3 (9 cc) Instrument used: #15 blade   Hemostasis achieved with: suture, pressure and electrodesiccation   Outcome: patient tolerated procedure well with no complications    Skin repair Complexity:  Intermediate Final length (cm):  2.5 Informed consent: discussed and consent obtained   Timeout: patient name, date of birth, surgical site, and procedure verified   Procedure prep:  Patient was prepped and draped in usual sterile fashion Prep type:  Chlorhexidine  Anesthesia: the lesion was anesthetized in a standard fashion   Anesthetic:  1% lidocaine  w/ epinephrine  1-100,000 buffered w/ 8.4% NaHCO3 Reason for type of repair: reduce tension to allow closure, reduce the risk of dehiscence,  infection, and necrosis, reduce subcutaneous dead space and avoid a hematoma, allow closure of the large defect and preserve normal anatomy   Undermining: edges could be approximated without difficulty   Subcutaneous layers (deep stitches):  Suture size:  4-0 Suture type: Monocryl (poliglecaprone 25)   Stitches:  Buried vertical mattress Fine/surface layer approximation (top stitches):  Suture size:  5-0 Suture type: Prolene (polypropylene)   Stitches comment:  Running locked Suture removal (days):  7 Hemostasis achieved with: suture, pressure and electrodesiccation Outcome: patient tolerated procedure well with no complications   Post-procedure details: sterile dressing applied and wound care instructions given   Dressing type: petrolatum, bandage and pressure dressing    Specimen 1 - Surgical pathology Differential Diagnosis: Cyst vs Other  Check Margins: No   Return in about 1 week (around 04/30/2024) for Suture Removal.  Tiffany Coleman, RMA, am acting as scribe for Boneta Sharps, MD .   Documentation: I have reviewed the above documentation for accuracy and completeness, and I agree with the above.  Boneta Sharps, MD

## 2024-04-24 ENCOUNTER — Telehealth: Payer: Self-pay

## 2024-04-24 ENCOUNTER — Encounter: Payer: Self-pay | Admitting: Dermatology

## 2024-04-24 ENCOUNTER — Ambulatory Visit: Payer: Self-pay | Admitting: Dermatology

## 2024-04-24 LAB — SURGICAL PATHOLOGY

## 2024-04-24 NOTE — Telephone Encounter (Signed)
 Patient doing well after surgery. Keep SR appt as scheduled. Lonell RAMAN., RMA

## 2024-04-28 NOTE — Telephone Encounter (Signed)
 Discussed pathology results. Patient voiced understanding. Cannot come 05/01/2024 for suture removal. Rescheduled per patient request.

## 2024-04-28 NOTE — Telephone Encounter (Signed)
-----   Message from [place] sent at 04/24/2024  6:53 PM EDT ----- Diagnosis right forearm :       CYSTIC CALCINOSIS CUTIS / SUBCUTIS   Please call to share that excision revealed calcifications of the skin and get update on surgical wound. Thank you. ----- Message ----- From: Interface, [facility] Sent: 04/24/2024   6:41 PM EDT To: Boneta Sharps, MD

## 2024-04-29 ENCOUNTER — Ambulatory Visit: Payer: Self-pay | Admitting: Pulmonary Disease

## 2024-04-29 NOTE — Telephone Encounter (Signed)
 FYI Only or Action Required?: Action required by provider: update on patient condition.  Patient is followed in Pulmonology for ILD, last seen on 11/07/2023 by [name], Praveen, MD.  Called Nurse Triage reporting Chest Pain.  Symptoms began going on 3 weeks ago.  Interventions attempted: Prescription medications: amoxicillin , zpak, prednisone , cough medicine, Rescue inhaler, Nebulizer treatments, and Other: went to the ER 04/12/2024 and saw her PCP two days later.  Symptoms are: better but not 100%.  Triage Disposition: Information or Advice Only Call  Patient/caregiver understands and will follow disposition?: Yes             Copied from CRM [id number]. Topic: Clinical - Red Word Triage >> Apr 29, 2024  2:01 PM Tiffany Coleman wrote: Red Word that prompted transfer to Nurse Triage: Patient [phone] states went to [facility] ER 04/12/24 and was diagnosis with pneumonia. Patient states still having pain in lungs, chest tightening, wheezing, and denies shortness of breath. Patient saw her pcp Dr. Avbuere last week and was prescribed Amoxil /Zpak and presdnisone. Patient states is not better and wants to see Dr. Theophilus. Please advise. Reason for Disposition  [1] Follow-up call to recent contact AND [2] information only call, no triage required  Answer Assessment - Initial Assessment Questions Wheezing is gone per patient She states she is now getting congested in nasal area   E2C2 Pulmonary Triage - Initial Assessment Questions Chief Complaint (e.g., cough, sob, wheezing, fever, chills, sweat or additional symptoms) *Go to specific symptom protocol after initial questions.  Congestion, pneumonia in both lungs,  How long have symptoms been present? Going on 3 weeks  Have you tested for COVID or Flu? Note: If not, ask patient if a home test can be taken. If so, instruct patient to call back for positive results. No  MEDICINES:   Have you used any OTC meds to help with symptoms?  No If yes, ask What medications? unknown  Have you used your inhalers/maintenance medication? Yes If yes, What medications? Albuterol  Inhaler--Inhale 2 puffs into the lungs every 6 (six) hours as needed for wheezing or shortness of breath. --uses two times a day Duoneb--Take 3 mLs by nebulization every 6 (six) hours as needed.   If inhaler, ask How many puffs and how often? Note: Review instructions on medication in the chart. Albuterol  Inhaler--Inhale 2 puffs into the lungs every 6 (six) hours as needed for wheezing or shortness of breath. --uses two times a day Duoneb--Take 3 mLs by nebulization every 6 (six) hours as needed.   OXYGEN: Do you wear supplemental oxygen? Yes If yes, How many liters are you supposed to use? 2 liters  Do you monitor your oxygen levels?  If yes, What is your reading (oxygen level) today? Patient states she usually does but she doesn't know where it is right now    8. PULMONARY RISK FACTORS: Do you have any history of lung disease?  (e.g., blood clots in lung, asthma, emphysema, birth control pills)     Covid lungs 9. CAUSE: What do you think is causing the chest pain?     pneumonia 10. OTHER SYMPTOMS: Do you have any other symptoms? (e.g., dizziness, nausea, vomiting, sweating, fever, difficulty breathing, cough)       Funny taste in mouth, lungs hurting  Answer Assessment - Initial Assessment Questions Wheezing is gone per patient She states she is now getting congested in nasal area Patient just wanted her pulmonologist to know her situation  Patient still taking Amoxicillin ---7 tablets left Patient states  she finished her Zpak Patient states she is also taking Prednisone ---Patient states she has 10mg  and 20mg  tablets---patient's PCP wanted her to take 20mg  daily for three days but patient took her 10mg  each day until she feels better  Patient just wants her Pulmonologist know that she went to the ER 04/12/2024 and she  went to her PCP 04/14/2024 ---patient not feeling 100%  Patient denies chest pain at this time and states she just still feels so unwell She thought she would have been admitted to the hospital at that time Patient also has a visit by video with her regular PCP on Facetime. She is denying any worsening symptoms or any chest pain complaints or shortness of breath. She just wanted Pulm to be aware of what she had going on and she didn't want an appointment unless her Pulmonologist wanted her to come in and be seen but she does have another follow up with her PCP tomorrow via video She is advised to call us  back if needed and if anything gets worse to go to the Emergency Room She verbalized understanding.    [facility] Pulmonary Triage - Initial Assessment Questions Chief Complaint (e.g., cough, sob, wheezing, fever, chills, sweat or additional symptoms) *Go to specific symptom protocol after initial questions.  Congestion, pneumonia in both lungs,  How long have symptoms been present? Going on 3 weeks  Have you tested for COVID or Flu? Note: If not, ask patient if a home test can be taken. If so, instruct patient to call back for positive results. No  MEDICINES:   Have you used any OTC meds to help with symptoms? No If yes, ask What medications? unknown  Have you used your inhalers/maintenance medication? Yes If yes, What medications? Albuterol  Inhaler--Inhale 2 puffs into the lungs every 6 (six) hours as needed for wheezing or shortness of breath. --uses two times a day Duoneb--Take 3 mLs by nebulization every 6 (six) hours as needed.   If inhaler, ask How many puffs and how often? Note: Review instructions on medication in the chart. Albuterol  Inhaler--Inhale 2 puffs into the lungs every 6 (six) hours as needed for wheezing or shortness of breath. --uses two times a day Duoneb--Take 3 mLs by nebulization every 6 (six) hours as needed.   OXYGEN: Do you wear supplemental  oxygen? Yes If yes, How many liters are you supposed to use? 2 liters  Do you monitor your oxygen levels?  If yes, What is your reading (oxygen level) today? Patient states she usually does but she doesn't know where it is right now    8. PULMONARY RISK FACTORS: Do you have any history of lung disease?  (e.g., blood clots in lung, asthma, emphysema, birth control pills)     Covid lungs 9. CAUSE: What do you think is causing the chest pain?     pneumonia 10. OTHER SYMPTOMS: Do you have any other symptoms? (e.g., dizziness, nausea, vomiting, sweating, fever, difficulty breathing, cough)       Funny taste in mouth, lungs hurting some  Protocols used: Chest Pain-A-AH, Information Only Call - No Triage-A-AH

## 2024-04-30 NOTE — Telephone Encounter (Signed)
 Called and discussed with patient She was seen in the ED on 8/30 for abdominal pain with CT abdomen showing bilateral ground glass opacities in the lungs Treated with amoxicillin  Z-Pak and she later got prednisone  and as an outpatient from PCP.  Also using nebulizer, inhaler  Overall she is slowly getting better Advised her to continue current therapy.  No additional Rx needed She will call us  back if there are any new issues. Nothing further needed  Semaj Coburn MD [facility] 04/30/2024, 2:18 PM

## 2024-05-01 ENCOUNTER — Ambulatory Visit: Admitting: Dermatology

## 2024-05-06 ENCOUNTER — Encounter: Payer: Self-pay | Admitting: Dermatology

## 2024-05-06 ENCOUNTER — Ambulatory Visit (INDEPENDENT_AMBULATORY_CARE_PROVIDER_SITE_OTHER): Admitting: Dermatology

## 2024-05-06 DIAGNOSIS — L811 Chloasma: Secondary | ICD-10-CM | POA: Diagnosis not present

## 2024-05-06 DIAGNOSIS — Z5189 Encounter for other specified aftercare: Secondary | ICD-10-CM

## 2024-05-06 DIAGNOSIS — Z4802 Encounter for removal of sutures: Secondary | ICD-10-CM

## 2024-05-06 MED ORDER — HYDROQUINONE 4 % EX CREA: TOPICAL_CREAM | Freq: Two times a day (BID) | CUTANEOUS | 1 refills | Status: AC

## 2024-05-06 NOTE — Progress Notes (Signed)
   Follow-Up Visit   Subjective  Tiffany Coleman is a 68 y.o. 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  Objective  Well appearing patient in no apparent distress; mood and affect are within normal limits.  Areas Examined: Right Forearm  Relevant physical exam findings are noted in the Assessment and Plan.    Assessment & Plan   VISIT FOR WOUND CHECK   ENCOUNTER FOR REMOVAL OF SUTURES   MELASMA   Encounter for Removal of Sutures - Incision site is clean, dry and intact. - Wound cleansed, sutures removed, wound cleansed and steri strips applied.  - Discussed pathology results showing CYSTIC CALCINOSIS CUTIS / SUBCUTIS  - Patient advised to keep steri-strips dry until they fall off. - Scars remodel for a full year. - Once steri-strips fall off, patient can apply over-the-counter silicone scar cream once to twice a day to help with scar remodeling if desired. - Patient advised to call with any concerns or if they notice any new or changing lesions.  MELASMA Exam: reticulated hyperpigmented patches at temples  Chronic condition with duration or expected duration over one year. Currently well-controlled.   Melasma is a chronic; persistent condition of hyperpigmented patches generally on the face, worse in summer due to higher UV exposure.    Heredity; thyroid disease; sun exposure; pregnancy; birth control pills; epilepsy medication and darker skin may predispose to Melasma.   Recommendations include: - Sun avoidance and daily broad spectrum (UVA/UVB) tinted mineral sunscreen SPF 30+, with Zinc  or Titanium Dioxide. - Rx topical bleaching creams (i.e. hydroquinone ) is  a common treatment but should not be used long term.  Hydroquinones may be mixed with retinoids; vitamin C; steroids; Kojic Acid. - Alastin A-luminate, retinoids, vitamin C, topical tranexamic acid, glycolic acid and kojic acid can be used for brightening while on break from hydroquinone  - Rx Azelaic Acid is also a treatment option that is safe for pregnancy (Category B). - OTC Heliocare can be helpful in control and prevention. - Oral Rx with Tranexamic Acid 250 mg - 650 mg po daily can be used for moderate to severe cases especially during summer (contraindications include pregnancy; lactation; hx of PE; hx of DVT; clotting disorder; heart disease; anticoagulant use and upcoming long trips)   - Chemical peels (would need multiple for best result).  - Lasers and  Microdermabrasion may also be helpful adjunct treatments.  Treatment Plan: - Start hydroquinone  4%; apply topically to affected areas twice daily.   Recommend daily broad spectrum (UVA/UVB) tinted mineral sunscreen SPF 30+, with Zinc  or Titanium Dioxide.   No follow-ups on file.  I, Emerick Ege, CMA am acting as scribe for Boneta Sharps, MD    Documentation: I have reviewed the above documentation for accuracy and completeness, and I agree with the above.  Boneta Sharps, MD

## 2024-05-06 NOTE — Patient Instructions (Signed)

## 2024-05-12 ENCOUNTER — Other Ambulatory Visit: Payer: Self-pay | Admitting: Pulmonary Disease

## 2024-05-27 ENCOUNTER — Encounter: Payer: Self-pay | Admitting: Pulmonary Disease

## 2024-05-27 ENCOUNTER — Ambulatory Visit (INDEPENDENT_AMBULATORY_CARE_PROVIDER_SITE_OTHER): Admitting: Pulmonary Disease

## 2024-05-27 VITALS — BP 156/85 | HR 54 | Ht 63.5 in | Wt 180.8 lb

## 2024-05-27 DIAGNOSIS — G4733 Obstructive sleep apnea (adult) (pediatric): Secondary | ICD-10-CM | POA: Diagnosis not present

## 2024-05-27 DIAGNOSIS — Z23 Encounter for immunization: Secondary | ICD-10-CM

## 2024-05-27 DIAGNOSIS — E119 Type 2 diabetes mellitus without complications: Secondary | ICD-10-CM | POA: Diagnosis not present

## 2024-05-27 DIAGNOSIS — I5030 Unspecified diastolic (congestive) heart failure: Secondary | ICD-10-CM

## 2024-05-27 DIAGNOSIS — J45909 Unspecified asthma, uncomplicated: Secondary | ICD-10-CM

## 2024-05-27 MED ORDER — GUAIFENESIN-DM 100-10 MG/5ML PO SYRP: 5.0000 mL | ORAL_SOLUTION | ORAL | 0 refills | Status: AC | PRN

## 2024-05-27 NOTE — Progress Notes (Signed)
 Tiffany Coleman    [id number]    10-11-1955  Primary Care Physician:Avbuere, Aliene, MD  Referring Physician: Shelda Aliene, MD [address] 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   amoxicillin  (AMOXIL ) 500 MG capsule Take 1 capsule (500 mg total) by mouth 3 (three) times daily.   azaTHIOprine  (IMURAN ) 50 MG tablet TAKE 3 TABLETS(150 MG) BY MOUTH DAILY   azithromycin  (ZITHROMAX  Z-PAK) 250 MG tablet 2 po day one, then 1 daily x 4 days   fluticasone  (FLONASE ) 50 MCG/ACT nasal spray Place 2 sprays into both nostrils daily.   furosemide  (LASIX ) 20 MG tablet Take 1 tablet (20 mg total) by mouth 2 (two) times daily. NEEDS FOLLOW UP APPOINTMENT FOR MORE REFILLS   guaiFENesin -dextromethorphan  (ROBITUSSIN DM) 100-10 MG/5ML syrup Take 5 mLs by mouth every 4 (four) hours as needed for cough.   hydroquinone  4 % cream Apply topically 2 (two) times daily.   hydrOXYzine (ATARAX) 25 MG tablet Take 25 mg by mouth 2 (two) times daily as needed. for anxiety   ibuprofen  (ADVIL ) 800 MG tablet  Take 1 tablet (800 mg total) by mouth 3 (three) times daily.   ibuprofen  (ADVIL ) 800 MG tablet TAKE 1 TABLET(800 MG) BY MOUTH EVERY 6 HOURS AS NEEDED   ipratropium-albuterol  (DUONEB) 0.5-2.5 (3) MG/3ML SOLN Take 3 mLs by nebulization every 6 (six) hours as needed.   LINZESS 290 MCG CAPS capsule Take 290 mcg by mouth every morning.   metroNIDAZOLE  (METROCREAM ) 0.75 % cream Apply topically 2 (two) times daily.   mupirocin  ointment (BACTROBAN ) 2 % Apply 1 Application topically daily.   oxyCODONE -acetaminophen  (PERCOCET) 5-325 MG tablet Take 1 tablet by mouth every 4 (four) hours as needed for severe pain (pain score 7-10).   OXYGEN Inhale 2 L into the lungs continuous.   pantoprazole  (PROTONIX ) 40 MG tablet Take 40 mg by mouth every morning.   rosuvastatin (CRESTOR) 10 MG tablet Take 1 tablet by mouth daily.   tirzepatide (MOUNJARO) 5 MG/0.5ML Pen Inject 5 mg into the skin once a week. Tuesdays   zolpidem  (AMBIEN ) 10 MG tablet Take 10 mg by mouth at bedtime as needed for sleep.   No facility-administered encounter medications on file as of 05/27/2024.    Allergies as of 05/27/2024 - Review Complete 05/27/2024  Allergen Reaction Noted   Latex Hives and Itching 08/18/2011   Other Itching 05/16/2021   Tape Hives, Itching, and Other (See Comments) 10/12/2016    Past Medical History:  Diagnosis Date   Anemia yrs ago   Anxiety    Arthritis  knees   Bronchitis    hx   CHF (congestive heart failure) (HCC)    Complication of anesthesia    trouble breathing when wake up needs breathing tx when wakes up   DM (diabetes mellitus), type 2 (HCC)    GERD (gastroesophageal reflux disease)    Hallux rigidus, right foot    History of COVID-19    Hx of colonic polyps 2015   Interstitial lung disease (HCC)    Morbid obesity (HCC)    Obstructive sleep apnea 06/02/2016   No CPAP used   Oxygen dependent    2 L [place]   Pneumonitis    due to covid in 2021   Recurrent ventral hernia 09/08/2013    Umbilical hernia     Past Surgical History:  Procedure Laterality Date   ABDOMINAL HYSTERECTOMY     partial   BREAST SURGERY Bilateral    cyst removed   COLON SURGERY     COLONOSCOPY WITH PROPOFOL  N/A 05/29/2017   Procedure: COLONOSCOPY WITH PROPOFOL ;  Surgeon: Dianna Specking, MD;  Location: [facility];  Service: Endoscopy;  Laterality: N/A;   COLONOSCOPY WITH PROPOFOL  N/A 05/22/2023   Procedure: COLONOSCOPY WITH PROPOFOL ;  Surgeon: Dianna Specking, MD;  Location: [facility];  Service: Gastroenterology;  Laterality: N/A;   FINGER SURGERY     right pinkie x 3   HALLUX VALGUS CHEILECTOMY Right 12/24/2023   Procedure: RIGHT FIRST METATARSOPHALANGEAL JOINT ARTHROPLASTY WITH SILASTIC IMPLANT;  Surgeon: Tobie Franky SQUIBB, DPM;  Location: [facility];  Service: Orthopedics/Podiatry;  Laterality: Right;  KELLER BUNION IMPLANT   HERNIA REPAIR     KNEE SURGERY Right 2017   PARTIAL COLECTOMY Left ~2004   for obstruction   POLYPECTOMY  05/22/2023   Procedure: POLYPECTOMY;  Surgeon: Dianna Specking, MD;  Location: [facility];  Service: Gastroenterology;;   right foot surgery  yrs ago   RIGHT HEART CATH N/A 05/20/2021   Procedure: RIGHT HEART CATH;  Surgeon: Rolan Ezra RAMAN, MD;  Location: [facility] CV LAB;  Service: Cardiovascular;  Laterality: N/A;   SHOULDER ARTHROSCOPY Right    stomach tumur     [id number] benign    Family History  Problem Relation Age of Onset   Deep vein thrombosis Mother    Diabetes Mother    Hypertension Mother    Varicose Veins Mother    Cancer Father        colon   Hypertension Sister    Hyperlipidemia Brother    Hypertension Daughter    Heart attack Son    Cancer Maternal Uncle     Social History   Socioeconomic History   Marital status: Single    Spouse name: Not on file   Number of children: Not on file   Years of education: Not on file   Highest education level: 11th grade  Occupational History   Not on file  Tobacco Use   Smoking  status: Some Days    Current packs/day: 0.00    Types: Cigarettes    Start date: 05/26/2000    Last attempt to quit: 05/26/2020    Years since quitting: 4.0    Passive exposure: Past   Smokeless tobacco: Never  Vaping Use   Vaping status: Never Used  Substance and Sexual Activity   Alcohol use: Yes    Comment: wine coolers occ   Drug use: No   Sexual activity: Not Currently    Birth control/protection: None  Other Topics Concern   Not on file  Social  History Narrative   Not on file   Social Drivers of Health   Financial Resource Strain: Not on file  Food Insecurity: Low Risk  (12/28/2022)   Received from [facility]   Hunger Vital Sign    Within the past 12 months, you worried that your food would run out before you got money to buy more: Never true    Within the past 12 months, the food you bought just didn't last and you didn't have money to get more. : Never true  Transportation Needs: Not on file (12/28/2022)  Recent Concern: Transportation Needs - Unmet Transportation Needs (12/28/2022)   Received from [facility]    In the past 12 months, has lack of reliable transportation kept you from medical appointments, meetings, work or from getting things needed for daily living? : Yes  Physical Activity: Inactive (07/12/2017)   Exercise Vital Sign    Days of Exercise per Week: 0 days    Minutes of Exercise per Session: 0 min  Stress: No Stress Concern Present (07/12/2017)   [employer] of Occupational Health - Occupational Stress Questionnaire    Feeling of Stress : Not at all  Social Connections: Unknown (12/25/2021)   [facility]   Social Network    Social Network: Not on file  Intimate Partner Violence: Unknown (11/17/2021)   Received from [facility]   HITS    Physically Hurt: Not on file    Insult or Talk Down To: Not on file    Threaten Physical Harm: Not on file    Scream or Curse: Not on file    Review of Systems   Respiratory:  Positive for shortness of breath.     Vitals:   05/27/24 1317 05/27/24 1323  BP: (!) 188/84 (!) 156/85  Pulse: (!) 54   SpO2: 97%      Physical Exam Constitutional:      Appearance: She is obese.  HENT:     Head: Normocephalic.     Mouth/Throat:     Mouth: Mucous membranes are moist.  Eyes:     General: No scleral icterus. Cardiovascular:     Rate and Rhythm: Normal rate and regular rhythm.     Heart sounds: No murmur heard.    No friction rub.  Pulmonary:     Effort: No respiratory distress.     Breath sounds: No stridor. No wheezing or rhonchi.  Musculoskeletal:     Cervical back: No rigidity or tenderness.  Neurological:     Mental Status: She is alert.  Psychiatric:        Mood and Affect: Mood normal.      Data Reviewed: CT abdomen 04/12/2024 reviewed by myself showing bibasal infiltrate High-resolution CT scan 11/09/2023 reviewed by myself showing postinflammatory fibrosis  Last pulmonary function test on record shows an FVC of 50%, FEV1 of 69% total lung capacity 40% with a DLCO 44%, severe restriction with severe diffusion defect  CTD serologies shows 1: 320, cytoplasmic  Assessment/Plan: Patient with postinflammatory fibrosis, recently treated for pneumonia  Symptoms continue to improve  Still gets short of breath with moderate activity - Requires oxygen supplementation with activity  She remains on Imuran   She requires oxygen for activity  Asthma - Appears well-controlled at present  Heart failure with preserved ejection fraction on diuretics  Obstructive sleep apnea - Intolerant of CPAP therapy  Diabetes  Musculoskeletal pain  A letter was provided to her today for release back to  work, needs accommodation for shortness of breath  Follow-up in about 3 months  Encouraged to call with significant concerns      Jennet Epley MD [facility] 05/27/2024, 8:55 PM  CC: Shelda Atlas,  MD

## 2024-05-27 NOTE — Patient Instructions (Addendum)
  Administer pneumonia vaccine  Cough medicine prescription sent to pharmacy for you  Schedule for breathing study  Follow-up in 3 months  Depending on breathing study, may require repeat CT scan of the chest  You may go back to work with accommodations  Call us  with significant concerns

## 2024-06-20 ENCOUNTER — Emergency Department (HOSPITAL_BASED_OUTPATIENT_CLINIC_OR_DEPARTMENT_OTHER)
Admission: EM | Admit: 2024-06-20 | Discharge: 2024-06-20 | Disposition: A | Discharge status: Home / Self Care | Attending: Emergency Medicine | Admitting: Emergency Medicine

## 2024-06-20 ENCOUNTER — Other Ambulatory Visit: Payer: Self-pay

## 2024-06-20 ENCOUNTER — Emergency Department (HOSPITAL_BASED_OUTPATIENT_CLINIC_OR_DEPARTMENT_OTHER): Admitting: Radiology

## 2024-06-20 ENCOUNTER — Emergency Department (HOSPITAL_BASED_OUTPATIENT_CLINIC_OR_DEPARTMENT_OTHER)

## 2024-06-20 ENCOUNTER — Encounter (HOSPITAL_BASED_OUTPATIENT_CLINIC_OR_DEPARTMENT_OTHER): Payer: Self-pay

## 2024-06-20 ENCOUNTER — Other Ambulatory Visit (HOSPITAL_BASED_OUTPATIENT_CLINIC_OR_DEPARTMENT_OTHER): Payer: Self-pay | Admitting: Neurology

## 2024-06-20 DIAGNOSIS — R0602 Shortness of breath: Secondary | ICD-10-CM | POA: Insufficient documentation

## 2024-06-20 DIAGNOSIS — R059 Cough, unspecified: Secondary | ICD-10-CM | POA: Insufficient documentation

## 2024-06-20 DIAGNOSIS — I509 Heart failure, unspecified: Secondary | ICD-10-CM | POA: Insufficient documentation

## 2024-06-20 DIAGNOSIS — E119 Type 2 diabetes mellitus without complications: Secondary | ICD-10-CM | POA: Insufficient documentation

## 2024-06-20 DIAGNOSIS — R079 Chest pain, unspecified: Secondary | ICD-10-CM | POA: Insufficient documentation

## 2024-06-20 DIAGNOSIS — R292 Abnormal reflex: Secondary | ICD-10-CM

## 2024-06-20 LAB — BASIC METABOLIC PANEL WITH GFR
Anion gap: 8 (ref 5–15)
BUN: 11 mg/dL (ref 8–23)
CO2: 28 mmol/L (ref 22–32)
Calcium: 10 mg/dL (ref 8.9–10.3)
Chloride: 102 mmol/L (ref 98–111)
Creatinine, Ser: 0.95 mg/dL (ref 0.44–1.00)
GFR, Estimated: >60 mL/min (ref >60)
Glucose, Bld: 95 mg/dL (ref 70–99)
Potassium: 3.9 mmol/L (ref 3.5–5.1)
Sodium: 138 mmol/L (ref 135–145)

## 2024-06-20 LAB — CBC
HCT: 35.8 % — ABNORMAL LOW (ref 36.0–46.0)
Hemoglobin: 11.7 g/dL — ABNORMAL LOW (ref 12.0–15.0)
MCH: 28.6 pg (ref 26.0–34.0)
MCHC: 32.7 g/dL (ref 30.0–36.0)
MCV: 87.5 fL (ref 80.0–100.0)
Platelets: 219 K/uL (ref 150–400)
RBC: 4.09 MIL/uL (ref 3.87–5.11)
RDW: 12.7 % (ref 11.5–15.5)
WBC: 6.3 K/uL (ref 4.0–10.5)
nRBC: 0 % (ref 0.0–0.2)

## 2024-06-20 LAB — RESP PANEL BY RT-PCR (RSV, FLU A&B, COVID)  RVPGX2
Influenza A by PCR: NEGATIVE
Influenza B by PCR: NEGATIVE
Resp Syncytial Virus by PCR: NEGATIVE
SARS Coronavirus 2 by RT PCR: NEGATIVE

## 2024-06-20 LAB — TROPONIN T, HIGH SENSITIVITY
Troponin T High Sensitivity: <15 ng/L (ref 0–19)
Troponin T High Sensitivity: <15 ng/L (ref 0–19)

## 2024-06-20 LAB — PRO BRAIN NATRIURETIC PEPTIDE: Pro Brain Natriuretic Peptide: <50 pg/mL (ref <300.0)

## 2024-06-20 MED ORDER — HYDROCODONE-ACETAMINOPHEN 5-325 MG PO TABS
1.0000 | ORAL_TABLET | Freq: Once | ORAL | Status: AC
  Administered 2024-06-20: 1 via ORAL
  Filled 2024-06-20: qty 1

## 2024-06-20 MED ORDER — IBUPROFEN 400 MG PO TABS
600.0000 mg | ORAL_TABLET | Freq: Once | ORAL | Status: DC
  Filled 2024-06-20: qty 1

## 2024-06-20 MED ORDER — SODIUM CHLORIDE 0.9 % IV BOLUS
1000.0000 mL | Freq: Once | INTRAVENOUS | Status: AC
  Administered 2024-06-20: 1000 mL via INTRAVENOUS

## 2024-06-20 MED ORDER — IOHEXOL 350 MG/ML SOLN
100.0000 mL | Freq: Once | INTRAVENOUS | Status: AC | PRN
  Administered 2024-06-20: 100 mL via INTRAVENOUS

## 2024-06-20 MED ORDER — IPRATROPIUM-ALBUTEROL 0.5-2.5 (3) MG/3ML IN SOLN
3.0000 mL | Freq: Once | RESPIRATORY_TRACT | Status: AC
  Administered 2024-06-20: 3 mL via RESPIRATORY_TRACT
  Filled 2024-06-20: qty 3

## 2024-06-20 NOTE — ED Notes (Addendum)
 PT stated she feels like she cannot breath to Radiology Tech, PT speaking in full sentences, endorses chest pain when this tech went into room. VS obtained, all within defined limits. PT on O2, Paramedic Robin informed on pt status.

## 2024-06-20 NOTE — ED Notes (Addendum)
 PT ambulated to doorway and stated displeasure with this tech due to not having call bell at bedside and being present to replace o2 therapy. This tech explained that due to an emergent patient in triage, my presence was needed in order to open up a room for an emergent patient in triage. Repeated that she did not like this techs vibe, and asked how mood was. This tech apologized for any service failures and ensured nasal cannula was in place and call bell is present. O2 when placed back on pulse ox 96% after returning to bed.

## 2024-06-20 NOTE — ED Triage Notes (Signed)
 Patient reports chest pain, vomiting, back pain, and shortness of breath as well as dizziness. Says her provider sent medications but she was unable to pick them up because of the pharmacy closing and no ride.

## 2024-06-20 NOTE — Discharge Instructions (Addendum)
 Evaluation today was reassuring.  Suspect this could be a viral process.  Recommend continue supportive treatment at home and assertive hydration.  Please follow-up with your PCP.  If you develop worsening chest pain, cough, fever shortness of breath or any other concerning symptom please return to ED for further evaluation.

## 2024-06-20 NOTE — ED Provider Notes (Signed)
 [place] [facility]PARTMENT AT [facility] Provider Note   CSN: [id number] Arrival date & time: 06/20/24  1412     Patient presents with: Chest Pain, Back Pain, and Nausea  HPI Tiffany Coleman is a 68 y.o. 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  Her symptoms have been going on for about a week.  The cough is productive with green and yellow sputum.  She is also felt nauseous but denies vomiting and abdominal pain.  Chest pain started a couple of days after the coughing is in the center of the chest and feels sharp pain it is worse with coughing and is intermittent.  She is also endorsed some pain in her middle of her back as well.  She denies fever at home.  Also reports that she feels lightheaded when she stands up.    Chest Pain Associated symptoms: back pain   Back Pain Associated symptoms: chest pain        Prior to Admission medications   Medication Sig Start Date End Date Taking? Authorizing Provider  ACCU-CHEK GUIDE TEST test strip daily. 04/19/24  Yes [provider]  Accu-Chek Softclix Lancets lancets daily. 04/19/24  Yes [provider]  brimonidine (ALPHAGAN P) 0.1 % SOLN  06/14/24  Yes [provider]  fluconazole  (DIFLUCAN ) 150 MG tablet Take 150 mg by mouth once a week. 06/11/24  Yes [provider]  latanoprost (XALATAN) 0.005 % ophthalmic solution Place 1 drop into both eyes every evening. 06/11/24  Yes [provider]  levofloxacin (LEVAQUIN) 500 MG tablet Take 500 mg by mouth daily. 06/19/24  Yes [provider]  predniSONE  (DELTASONE ) 20 MG tablet Take by mouth. 06/19/24  Yes [provider]  predniSONE  (DELTASONE ) 5 MG tablet Take 5 mg by mouth daily. 06/09/24  Yes [provider]  albuterol  (VENTOLIN  HFA) 108 (90 Base) MCG/ACT inhaler Inhale 2 puffs into the lungs every 6 (six) hours as needed for  wheezing or shortness of breath. 09/06/20   Parrett, Madelin RAMAN, NP  amoxicillin  (AMOXIL ) 500 MG capsule Take 1 capsule (500 mg total) by mouth 3 (three) times daily. 04/12/24   Tran, Bowie, PA-C  azaTHIOprine  (IMURAN ) 50 MG tablet TAKE 3 TABLETS(150 MG) BY MOUTH DAILY 05/13/24   [name], Praveen, MD  azithromycin  (ZITHROMAX  Z-PAK) 250 MG tablet 2 po day one, then 1 daily x 4 days 04/12/24   Nivia Colon, PA-C  fluticasone  (FLONASE ) 50 MCG/ACT nasal spray Place 2 sprays into both nostrils daily. 06/01/20   Sebastian Toribio GAILS, MD  furosemide  (LASIX ) 20 MG tablet Take 1 tablet (20 mg total) by mouth 2 (two) times daily. NEEDS FOLLOW UP APPOINTMENT FOR MORE REFILLS 11/28/21   Rolan Ezra RAMAN, MD  guaiFENesin -dextromethorphan  (ROBITUSSIN DM) 100-10 MG/5ML syrup Take 5 mLs by mouth every 4 (four) hours as needed for cough. 05/27/24   Olalere, Jennet A, MD  hydroquinone  4 % cream Apply topically 2 (two) times daily. 05/06/24   Claudene Lehmann, MD  hydrOXYzine (ATARAX) 25 MG tablet Take 25 mg by mouth 2 (two) times daily as needed. for anxiety 05/23/22   [provider]  ibuprofen  (ADVIL ) 800 MG tablet Take 1 tablet (800 mg total) by mouth 3 (three) times daily. 01/14/22   Elnor Hila P, DO  ibuprofen  (ADVIL ) 800 MG tablet TAKE 1 TABLET(800 MG) BY MOUTH EVERY 6 HOURS AS NEEDED 02/08/24   Tobie Franky SQUIBB,  DPM  ipratropium-albuterol  (DUONEB) 0.5-2.5 (3) MG/3ML SOLN Take 3 mLs by nebulization every 6 (six) hours as needed. 12/14/20   [name], Praveen, MD  LINZESS 290 MCG CAPS capsule Take 290 mcg by mouth every morning.    [provider]  metroNIDAZOLE  (METROCREAM ) 0.75 % cream Apply topically 2 (two) times daily. 02/28/24 02/27/25  Claudene Lehmann, MD  mupirocin  ointment (BACTROBAN ) 2 % Apply 1 Application topically daily. 04/23/24   Claudene Lehmann, MD  oxyCODONE -acetaminophen  (PERCOCET) 5-325 MG tablet Take 1 tablet by mouth every 4 (four) hours as needed for severe pain (pain score 7-10).  12/24/23   Tobie Franky SQUIBB, DPM  OXYGEN Inhale 2 L into the lungs continuous.    [provider]  pantoprazole  (PROTONIX ) 40 MG tablet Take 40 mg by mouth every morning.    [provider]  rosuvastatin (CRESTOR) 10 MG tablet Take 1 tablet by mouth daily. 10/13/23   [provider]  tirzepatide CLOYDE) 5 MG/0.5ML Pen Inject 5 mg into the skin once a week. Tuesdays 08/08/21   [provider]  zolpidem  (AMBIEN ) 10 MG tablet Take 10 mg by mouth at bedtime as needed for sleep. 05/15/21   [provider]    Allergies: Latex, Other, and Tape    Review of Systems  Cardiovascular:  Positive for chest pain.  Musculoskeletal:  Positive for back pain.    Updated Vital Signs BP (!) 162/92 (BP Location: Right Arm)   Pulse (!) 54   Temp 98.7 F (37.1 C) (Oral)   Resp 18   SpO2 100%   Physical Exam Vitals and nursing note reviewed.  HENT:     Head: Normocephalic and atraumatic.     Mouth/Throat:     Mouth: Mucous membranes are moist.  Eyes:     General:        Right eye: No discharge.        Left eye: No discharge.     Conjunctiva/sclera: Conjunctivae normal.  Cardiovascular:     Rate and Rhythm: Normal rate and regular rhythm.     Pulses: Normal pulses.     Heart sounds: Normal heart sounds.  Pulmonary:     Effort: Pulmonary effort is normal.     Breath sounds: Normal breath sounds and air entry. No decreased breath sounds, wheezing, rhonchi or rales.  Abdominal:     General: Abdomen is flat.     Palpations: Abdomen is soft.     Tenderness: There is no abdominal tenderness.  Skin:    General: Skin is warm and dry.  Neurological:     General: No focal deficit present.  Psychiatric:        Mood and Affect: Mood normal.     (all labs ordered are listed, but only abnormal results are displayed) Labs Reviewed  CBC - Abnormal; Notable for the following components:      Result Value   Hemoglobin 11.7 (*)    HCT 35.8 (*)    All other  components within normal limits  RESP PANEL BY RT-PCR (RSV, FLU A&B, COVID)  RVPGX2  BASIC METABOLIC PANEL WITH GFR  PRO BRAIN NATRIURETIC PEPTIDE  TROPONIN T, HIGH SENSITIVITY  TROPONIN T, HIGH SENSITIVITY    EKG: EKG Interpretation Date/Time:  Friday June 20 2024 14:22:11 EST Ventricular Rate:  60 PR Interval:  174 QRS Duration:  84 QT Interval:  386 QTC Calculation: 386 R Axis:   86  Text Interpretation: Normal sinus rhythm Nonspecific ST abnormality Abnormal ECG When compared  with ECG of 20-Dec-2023 13:37, Questionable change in QRS axis Confirmed by Yolande Charleston [phone]) on 06/20/2024 3:39:13 PM  Radiology: CT Angio Chest/Abd/Pel for Dissection W and/or W/WO Result Date: 06/20/2024 EXAM: CTA CHEST, ABDOMEN AND PELVIS WITHOUT AND WITH CONTRAST 06/20/2024 05:10:49 PM TECHNIQUE: CTA of the chest was performed without and with the administration of 100 mL of iohexol  (OMNIPAQUE ) 350 MG/ML injection. CTA of the abdomen and pelvis was performed with the administration of 100 mL of iohexol  (OMNIPAQUE ) 350 MG/ML injection. Multiplanar reformatted images are provided for review. MIP images are provided for review. Automated exposure control, iterative reconstruction, and/or weight based adjustment of the mA/kV was utilized to reduce the radiation dose to as low as reasonably achievable. COMPARISON: Comparison with same day chest x-ray, CT 04/12/2024, and CT 04/10/2024. CLINICAL HISTORY: Chest pain. Acute aortic syndrome (AAS) suspected. FINDINGS: VASCULATURE: AORTA: Mild aortic atherosclerotic calcification. No hemodynamically significant stenosis of the aorta or the mesentery, renal, or iliac artery branches. No dissection. No aneurysm. PULMONARY ARTERIES: No pulmonary embolism with the limits of this exam. GREAT VESSELS OF AORTIC ARCH: No acute finding. No dissection. No arterial occlusion or significant stenosis. CELIAC TRUNK: No acute finding. No occlusion or significant stenosis. SUPERIOR  MESENTERIC ARTERY: No acute finding. No occlusion or significant stenosis. INFERIOR MESENTERIC ARTERY: No acute finding. No occlusion or significant stenosis. RENAL ARTERIES: No acute finding. No occlusion or significant stenosis. ILIAC ARTERIES: No acute finding. No occlusion or significant stenosis. CHEST: MEDIASTINUM: No mediastinal lymphadenopathy. The heart and pericardium demonstrate no acute abnormality. LUNGS AND PLEURA: Scarring in the lungs. No focal pneumonia. No pleural effusion or pneumothorax. THORACIC BONES AND SOFT TISSUES: No acute bone or soft tissue abnormality. ABDOMEN AND PELVIS: LIVER: The liver is unremarkable. GALLBLADDER AND BILE DUCTS: Gallbladder is unremarkable. No biliary ductal dilatation. SPLEEN: The spleen is unremarkable. PANCREAS: The pancreas is unremarkable. ADRENAL GLANDS: Bilateral adrenal glands demonstrate no acute abnormality. KIDNEYS, URETERS AND BLADDER: No stones in the kidneys or ureters. No hydronephrosis. No perinephric or periureteral stranding. Urinary bladder is unremarkable. GI AND BOWEL: Colonic diverticulosis without diverticulitis. Normal appendix. Stomach and duodenal sweep demonstrate no acute abnormality. There is no bowel obstruction. No abnormal bowel wall thickening or distension. REPRODUCTIVE: Reproductive organs are unremarkable. PERITONEUM AND RETROPERITONEUM: No ascites or free air. LYMPH NODES: No lymphadenopathy. ABDOMINAL BONES AND SOFT TISSUES: No acute abnormality of the bones. No acute soft tissue abnormality. IMPRESSION: 1. No acute aortic syndrome. Electronically signed by: Norman Gatlin MD 06/20/2024 05:52 PM EST RP Workstation: [id number]   DG Chest 2 View Result Date: 06/20/2024 EXAM: 2 VIEW(S) XRAY OF THE CHEST 06/20/2024 02:39:00 PM COMPARISON: 06/02/2023 CLINICAL HISTORY: CP CP FINDINGS: LUNGS AND PLEURA: Stable interstitial densities are noted most consistent with scarring. No pulmonary edema. No pleural effusion. No pneumothorax.  HEART AND MEDIASTINUM: No acute abnormality of the cardiac and mediastinal silhouettes. BONES AND SOFT TISSUES: No acute osseous abnormality. IMPRESSION: 1. No acute cardiopulmonary abnormality. Electronically signed by: Lynwood Seip MD 06/20/2024 03:19 PM EST RP Workstation: [id number]     Procedures   Medications Ordered in the ED  sodium chloride  0.9 % bolus 1,000 mL (0 mLs Intravenous Stopped 06/20/24 1801)  HYDROcodone -acetaminophen  (NORCO/VICODIN) 5-325 MG per tablet 1 tablet (1 tablet Oral Given 06/20/24 1553)  ipratropium-albuterol  (DUONEB) 0.5-2.5 (3) MG/3ML nebulizer solution 3 mL (3 mLs Nebulization Given 06/20/24 1551)  iohexol  (OMNIPAQUE ) 350 MG/ML injection 100 mL (100 mLs Intravenous Contrast Given 06/20/24 1654)  Medical Decision Making Amount and/or Complexity of Data Reviewed Labs: ordered. Radiology: ordered.  Risk Prescription drug management.   Initial Impression and Ddx 68 year old well-appearing female presenting for chest pain, cough, nausea and intermittent shortness of breath.  Exam was unremarkable. She looks well, no acute distress and vitals are reassuring.  DDx includes pneumonia, ACS, aortic dissection, PE, intra-abdominal infection, other. Patient PMH that increases complexity of ED encounter:  ILD on 2 L of oxygen at home, CHF, diabetes  Interpretation of Diagnostics - I independent reviewed and interpreted the labs as followed: non acute, no troponins x 2, normal proBNP  - I independently visualized the following imaging with scope of interpretation limited to determining acute life threatening conditions related to emergency care: CT angio chest/ab/pelvis and CXR, which revealed no acute findings  - I personally reviewed and interpreted EKG which revealed NSR with evidence of ischemia  Patient Reassessment and Ultimate Disposition/Management Workup overall reassuring.  Patient has been stable for a few hours on her  home oxygen without hypoxia or evidence of respiratory distress.  CT angio of the chest abdomen pelvis and chest x-ray were without acute findings.  She remains well-appearing and hemodynamically stable, feel that she is safe for discharge.  Suspect this could be a viral process.  Patient management required discussion with the following services or consulting groups:  None  Complexity of Problems Addressed Acute complicated illness or Injury  Additional Data Reviewed and Analyzed Further history obtained from: Past medical history and medications listed in the EMR and Prior ED visit notes  Patient Encounter Risk Assessment Consideration of hospitalization      Final diagnoses:  Chest pain, unspecified type  Shortness of breath    ED Discharge Orders     None          Lang Norleen POUR, PA-C 06/20/24 1807    Doretha Folks, MD 06/25/24 1643

## 2024-06-20 NOTE — ED Notes (Signed)
 Patient transported to CT

## 2024-06-20 NOTE — ED Notes (Signed)
 PT laying flat in order to obtain orthostatic vital signs

## 2024-06-23 ENCOUNTER — Telehealth: Payer: Self-pay | Admitting: *Deleted

## 2024-06-23 NOTE — Telephone Encounter (Signed)
 Copied from CRM [id number]. Topic: Clinical - Medical Advice >> Jun 20, 2024  3:48 PM Tiffany Coleman wrote: Reason for CRM: Patient is currently at the emergency department for chest pain, dizziness and throwing up mucus. Patient states they hospital staff doesn't see anything and she would like to alert Dr. Neda to this and is requesting his nurse to call her Monday morning.   I called and spoke with the pt She states her symptoms have improved since ED visit 06/20/24  I offered ov with AO today for f/u and she declined, and requested appt for after the Holidays  She is scheduled for 07/23/24 and was advised to call back sooner if needed  Nothing further needed

## 2024-07-23 ENCOUNTER — Encounter: Payer: Self-pay | Admitting: Pulmonary Disease

## 2024-07-23 ENCOUNTER — Ambulatory Visit (INDEPENDENT_AMBULATORY_CARE_PROVIDER_SITE_OTHER): Admitting: Pulmonary Disease

## 2024-07-23 VITALS — BP 124/80 | HR 79 | Temp 98.2°F | Ht 63.0 in | Wt 177.8 lb

## 2024-07-23 DIAGNOSIS — J9611 Chronic respiratory failure with hypoxia: Secondary | ICD-10-CM

## 2024-07-23 DIAGNOSIS — J8410 Pulmonary fibrosis, unspecified: Secondary | ICD-10-CM

## 2024-07-23 DIAGNOSIS — F17210 Nicotine dependence, cigarettes, uncomplicated: Secondary | ICD-10-CM | POA: Diagnosis not present

## 2024-07-23 DIAGNOSIS — K219 Gastro-esophageal reflux disease without esophagitis: Secondary | ICD-10-CM

## 2024-07-23 DIAGNOSIS — Z8709 Personal history of other diseases of the respiratory system: Secondary | ICD-10-CM | POA: Diagnosis not present

## 2024-07-23 DIAGNOSIS — G4733 Obstructive sleep apnea (adult) (pediatric): Secondary | ICD-10-CM | POA: Diagnosis not present

## 2024-07-23 DIAGNOSIS — J849 Interstitial pulmonary disease, unspecified: Secondary | ICD-10-CM

## 2024-07-23 NOTE — Progress Notes (Signed)
 Tiffany Coleman    [id number]    Mar 12, 1956  Primary Care Physician:Avbuere, Aliene, MD  Referring Physician: Shelda Aliene, MD [address] [place],  [place] [address]  Chief complaint:   Patient with a history of interstitial lung disease Recent ED visit for chest discomfort, shortness of breath-ED visit on 06/20/2024  Discussed the use of AI scribe software for clinical note transcription with the patient, who gave verbal consent to proceed.  History of Present Illness Tiffany Coleman is a 68 year old female with lung scarring who presents for evaluation of her oxygen use during sleep.  She experiences chest discomfort, described as pain in the chest bone and tightness in the arm, similar to a blood pressure cuff squeezing. This discomfort sometimes occurs after eating and lying down, which she suspects is related to indigestion. Despite taking acid reflux medication, symptoms persist, especially when eating before bed.  Her history of lung scarring has been monitored through CT scans, with the most recent in November showing inflammation and scarring. Previous CT scans for comparison showed more inflammation than four years ago. Lung function was last assessed in 2022, revealing reduced capacity with measurements of 55% and 68% for different parameters, where 80% is expected. She uses oxygen at night but not during the day and is concerned about the necessity of using oxygen while traveling.  She mentions experiencing sharp pains in her head and tightness in her arm, which she associates with her medication, Mounjaro. Occasional rattling in the chest is reported. She has a history of smoking and acknowledges the need to quit. She has had COVID-19 a couple of times, which she feels has affected her lungs.  No shortness of breath, fever, chills, or significant sputum production.  She currently uses oxygen supplementation at night, she wonders whether she can stop  using this She has been able to go without oxygen during the day even with activity  She does have a past history of sleep disordered breathing, did not tolerate CPAP in the past and not interested in revisiting CPAP  An active smoker she knows she needs to quit and she is working on this-smokes a few cigarettes here and there  Outpatient Encounter Medications as of 07/23/2024  Medication Sig   ACCU-CHEK GUIDE TEST test strip daily.   Accu-Chek Softclix Lancets lancets daily.   albuterol  (VENTOLIN  HFA) 108 (90 Base) MCG/ACT inhaler Inhale 2 puffs into the lungs every 6 (six) hours as needed for wheezing or shortness of breath.   azaTHIOprine  (IMURAN ) 50 MG tablet TAKE 3 TABLETS(150 MG) BY MOUTH DAILY   brimonidine (ALPHAGAN P) 0.1 % SOLN    fluticasone  (FLONASE ) 50 MCG/ACT nasal spray Place 2 sprays into both nostrils daily.   furosemide  (LASIX ) 20 MG tablet Take 1 tablet (20 mg total) by mouth 2 (two) times daily. NEEDS FOLLOW UP APPOINTMENT FOR MORE REFILLS   guaiFENesin -dextromethorphan  (ROBITUSSIN DM) 100-10 MG/5ML syrup Take 5 mLs by mouth every 4 (four) hours as needed for cough.   hydroquinone  4 % cream Apply topically 2 (two) times daily.   hydrOXYzine (ATARAX) 25 MG tablet Take 25 mg by mouth 2 (two) times daily as needed. for anxiety   ibuprofen  (ADVIL ) 800 MG tablet Take 1 tablet (800 mg total) by mouth 3 (three) times daily.   ibuprofen  (ADVIL ) 800 MG tablet TAKE 1 TABLET(800 MG) BY MOUTH EVERY 6 HOURS AS NEEDED   ipratropium-albuterol  (DUONEB) 0.5-2.5 (3) MG/3ML SOLN Take 3  mLs by nebulization every 6 (six) hours as needed.   latanoprost (XALATAN) 0.005 % ophthalmic solution Place 1 drop into both eyes every evening.   levofloxacin (LEVAQUIN) 500 MG tablet Take 500 mg by mouth daily.   LINZESS 290 MCG CAPS capsule Take 290 mcg by mouth every morning.   metroNIDAZOLE  (METROCREAM ) 0.75 % cream Apply topically 2 (two) times daily.   mupirocin  ointment (BACTROBAN ) 2 % Apply 1  Application topically daily.   OXYGEN Inhale 2 L into the lungs continuous.   pantoprazole  (PROTONIX ) 40 MG tablet Take 40 mg by mouth every morning.   rosuvastatin (CRESTOR) 10 MG tablet Take 1 tablet by mouth daily.   tirzepatide (MOUNJARO) 5 MG/0.5ML Pen Inject 5 mg into the skin once a week. Tuesdays   zolpidem  (AMBIEN ) 10 MG tablet Take 10 mg by mouth at bedtime as needed for sleep.   [DISCONTINUED] amoxicillin  (AMOXIL ) 500 MG capsule Take 1 capsule (500 mg total) by mouth 3 (three) times daily.   [DISCONTINUED] azithromycin  (ZITHROMAX  Z-PAK) 250 MG tablet 2 po day one, then 1 daily x 4 days   [DISCONTINUED] fluconazole  (DIFLUCAN ) 150 MG tablet Take 150 mg by mouth once a week.   [DISCONTINUED] oxyCODONE -acetaminophen  (PERCOCET) 5-325 MG tablet Take 1 tablet by mouth every 4 (four) hours as needed for severe pain (pain score 7-10).   [DISCONTINUED] predniSONE  (DELTASONE ) 20 MG tablet Take by mouth.   [DISCONTINUED] predniSONE  (DELTASONE ) 5 MG tablet Take 5 mg by mouth daily.   No facility-administered encounter medications on file as of 07/23/2024.    Allergies as of 07/23/2024 - Review Complete 07/23/2024  Allergen Reaction Noted   Latex Hives and Itching 08/18/2011   Other Itching 05/16/2021   Tape Hives, Itching, and Other (See Comments) 10/12/2016    Past Medical History:  Diagnosis Date   Anemia yrs ago   Anxiety    Arthritis    knees   Bronchitis    hx   CHF (congestive heart failure) (HCC)    Complication of anesthesia    trouble breathing when wake up needs breathing tx when wakes up   DM (diabetes mellitus), type 2 (HCC)    GERD (gastroesophageal reflux disease)    Hallux rigidus, right foot    History of COVID-19    Hx of colonic polyps 2015   Interstitial lung disease (HCC)    Morbid obesity (HCC)    Obstructive sleep apnea 06/02/2016   No CPAP used   Oxygen dependent    2 L [place]   Pneumonitis    due to covid in 2021   Recurrent ventral hernia 09/08/2013    Umbilical hernia     Past Surgical History:  Procedure Laterality Date   ABDOMINAL HYSTERECTOMY     partial   BREAST SURGERY Bilateral    cyst removed   COLON SURGERY     COLONOSCOPY WITH PROPOFOL  N/A 05/29/2017   Procedure: COLONOSCOPY WITH PROPOFOL ;  Surgeon: Dianna Specking, MD;  Location: [facility];  Service: Endoscopy;  Laterality: N/A;   COLONOSCOPY WITH PROPOFOL  N/A 05/22/2023   Procedure: COLONOSCOPY WITH PROPOFOL ;  Surgeon: Dianna Specking, MD;  Location: [facility];  Service: Gastroenterology;  Laterality: N/A;   FINGER SURGERY     right pinkie x 3   HALLUX VALGUS CHEILECTOMY Right 12/24/2023   Procedure: RIGHT FIRST METATARSOPHALANGEAL JOINT ARTHROPLASTY WITH SILASTIC IMPLANT;  Surgeon: Tobie Franky SQUIBB, DPM;  Location: [facility];  Service: Orthopedics/Podiatry;  Laterality: Right;  KELLER BUNION IMPLANT   HERNIA REPAIR  KNEE SURGERY Right 2017   PARTIAL COLECTOMY Left ~2004   for obstruction   POLYPECTOMY  05/22/2023   Procedure: POLYPECTOMY;  Surgeon: Dianna Specking, MD;  Location: [facility];  Service: Gastroenterology;;   right foot surgery  yrs ago   RIGHT HEART CATH N/A 05/20/2021   Procedure: RIGHT HEART CATH;  Surgeon: Rolan Ezra RAMAN, MD;  Location: [facility];  Service: Cardiovascular;  Laterality: N/A;   SHOULDER ARTHROSCOPY Right    stomach tumur     [id number] benign    Family History  Problem Relation Age of Onset   Deep vein thrombosis Mother    Diabetes Mother    Hypertension Mother    Varicose Veins Mother    Cancer Father        colon   Hypertension Sister    Hyperlipidemia Brother    Hypertension Daughter    Heart attack Son    Cancer Maternal Uncle     Social History   Socioeconomic History   Marital status: Single    Spouse name: Not on file   Number of children: Not on file   Years of education: Not on file   Highest education level: 11th grade  Occupational History   Not on file  Tobacco Use    Smoking status: Some Days    Current packs/day: 0.00    Types: Cigarettes    Start date: 05/26/2000    Last attempt to quit: 05/26/2020    Years since quitting: 4.1    Passive exposure: Past   Smokeless tobacco: Never  Vaping Use   Vaping status: Never Used  Substance and Sexual Activity   Alcohol use: Yes    Comment: wine coolers occ   Drug use: No   Sexual activity: Not Currently    Birth control/protection: None  Other Topics Concern   Not on file  Social History Narrative   Not on file   Social Drivers of Health   Financial Resource Strain: Not on file  Food Insecurity: Low Risk (12/28/2022)   Received from [facility]   Hunger Vital Sign    Within the past 12 months, you worried that your food would run out before you got money to buy more: Never true    Within the past 12 months, the food you bought just didn't last and you didn't have money to get more. : Never true  Transportation Needs: Not on file (12/28/2022)  Recent Concern: Transportation Needs - Unmet Transportation Needs (12/28/2022)   Received from [facility]    In the past 12 months, has lack of reliable transportation kept you from medical appointments, meetings, work or from getting things needed for daily living? : Yes  Physical Activity: Inactive (07/12/2017)   Exercise Vital Sign    Days of Exercise per Week: 0 days    Minutes of Exercise per Session: 0 min  Stress: No Stress Concern Present (07/12/2017)   [employer] of Occupational Health - Occupational Stress Questionnaire    Feeling of Stress : Not at all  Social Connections: Somewhat Isolated (07/12/2017)   Social Connection and Isolation Panel    Frequency of Communication with Friends and Family: More than three times a week    Frequency of Social Gatherings with Friends and Family: More than three times a week    Attends Religious Services: Never    Database Administrator or Organizations: Yes    Attends Tax Inspector Meetings: 1 to  4 times per year    Marital Status: Divorced  Intimate Partner Violence: Not At Risk (07/12/2017)   Humiliation, Afraid, Rape, and Kick questionnaire    Fear of Current or Ex-Partner: No    Emotionally Abused: No    Physically Abused: No    Sexually Abused: No    Review of Systems  Respiratory:  Negative for cough and chest tightness.   Neurological:  Positive for headaches.  Psychiatric/Behavioral:  Negative for sleep disturbance.     Vitals:   07/23/24 1324  BP: 124/80  Pulse: 79  Temp: 98.2 F (36.8 C)  SpO2: 95%     Physical Exam Constitutional:      Appearance: She is obese.  HENT:     Head: Normocephalic.     Right Ear: Tympanic membrane normal.     Nose: Nose normal.     Mouth/Throat:     Mouth: Mucous membranes are moist.  Eyes:     Pupils: Pupils are equal, round, and reactive to light.  Cardiovascular:     Rate and Rhythm: Normal rate and regular rhythm.     Heart sounds: No murmur heard.    No friction rub.  Pulmonary:     Effort: No respiratory distress.     Breath sounds: No stridor. Rhonchi present. No wheezing.  Musculoskeletal:     Cervical back: No rigidity or tenderness.  Neurological:     Mental Status: She is alert.  Psychiatric:        Mood and Affect: Mood normal.    Data Reviewed: CT scan 06/20/2024 was reviewed with the patient and compared with CT from 09/20/2022 and 11/09/2023 - Showing evidence of interstitial lung disease  PFT was also reviewed with the patient during the visit today showing restrictive physiology with severely reduced diffusing capacity    Assessment and Plan Assessment & Plan Pulmonary fibrosis with chronic respiratory failure and hypoxia Chronic pulmonary fibrosis with associated scarring and inflammation in the lungs. CT scan shows increased inflammation compared to previous scans. Lung function tests from 2022 indicate reduced lung function with FEV1 at 55% and FVC at 68%. Oxygen  saturation is significantly reduced at 44%. No evidence of malignancy on CT scan. Continues to use oxygen at night and during activity. Discussed the importance of staying active and continuing current medications to prevent further deterioration. Discussed the need for a repeat pulmonary function test to assess stability or progression of lung function. Discussed the option of an overnight oximetry study to evaluate the necessity of nighttime oxygen use. - Ordered repeat pulmonary function test to assess lung function stability or progression. - Scheduled overnight oximetry study to evaluate the necessity of nighttime oxygen use. - Encouraged continued use of oxygen at night and during activity as needed. - Advised to stay active and continue current medications to prevent further deterioration.  Gastroesophageal reflux disease Intermittent chest pain and discomfort, possibly related to gastroesophageal reflux disease. Symptoms may be exacerbated by eating before bedtime. Discussed the importance of dietary modifications to manage symptoms, including avoiding eating within 3-4 hours before bedtime to prevent reflux and associated symptoms. - Advised to avoid eating within 3-4 hours before bedtime to prevent reflux symptoms. - Continue current acid reflux medication.  Nicotine dependence Continues to smoke despite known risks to lung health. Discussed the significant health risks associated with smoking, including increased risk of heart disease, heart failure, atrial fibrillation, and stroke. Emphasized the importance of quitting smoking to prevent further lung damage and improve overall  health. - Strongly advised smoking cessation to reduce health risks and improve lung health.  Scheduled for pulmonary function test - Will follow-up after the PFT  Encouraged to exercise regularly  Needs to quit smoking  Obstructive sleep apnea - Not interested in having further evaluation  Hypertension -  Well-controlled  History of asthma - Controlled symptoms  Chronic respiratory failure - Not using oxygen during the day any longer  Will schedule for overnight oximetry   No orders of the defined types were placed in this encounter.     Jennet Epley MD [facility] 07/23/2024, 1:50 PM  CC: Shelda Atlas, MD

## 2024-07-23 NOTE — Patient Instructions (Signed)
 Continue to work on quitting smoking  Overnight oximetry -Make sure you keep your oxygen off when the study is done- -this is to ascertain whether you still need oxygen at night  Schedule for pulmonary function test  Follow-up in about 3 months  Continue to stay active, exercise regularly  Call us  with significant concerns

## 2024-08-19 ENCOUNTER — Ambulatory Visit (HOSPITAL_BASED_OUTPATIENT_CLINIC_OR_DEPARTMENT_OTHER)

## 2024-08-19 DIAGNOSIS — Z23 Encounter for immunization: Secondary | ICD-10-CM | POA: Diagnosis not present

## 2024-08-19 LAB — PULMONARY FUNCTION TEST
DL/VA % pred: 98 %
DL/VA: 4.1 ml/min/mmHg/L
DLCO cor % pred: 69 %
DLCO cor: 12.98 ml/min/mmHg
DLCO unc % pred: 65 %
DLCO unc: 12.25 ml/min/mmHg
FEF 25-75 Post: 2.95 L/s
FEF 25-75 Pre: 3.23 L/s
FEF2575-%Change-Post: -8 %
FEF2575-%Pred-Post: 160 %
FEF2575-%Pred-Pre: 175 %
FEV1-%Change-Post: 7 %
FEV1-%Pred-Post: 93 %
FEV1-%Pred-Pre: 86 %
FEV1-Post: 2.03 L
FEV1-Pre: 1.88 L
FEV1FVC-%Change-Post: -2 %
FEV1FVC-%Pred-Pre: 123 %
FEV6-%Change-Post: 10 %
FEV6-%Pred-Post: 80 %
FEV6-%Pred-Pre: 73 %
FEV6-Post: 2.22 L
FEV6-Pre: 2.02 L
FEV6FVC-%Pred-Post: 104 %
FEV6FVC-%Pred-Pre: 104 %
FVC-%Change-Post: 10 %
FVC-%Pred-Post: 77 %
FVC-%Pred-Pre: 70 %
FVC-Post: 2.22 L
FVC-Pre: 2.02 L
Post FEV1/FVC ratio: 91 %
Post FEV6/FVC ratio: 100 %
Pre FEV1/FVC ratio: 93 %
Pre FEV6/FVC Ratio: 100 %

## 2024-08-19 NOTE — Patient Instructions (Signed)
 Spiro pre/post with DLCO performed today. See PFT for note.

## 2024-08-19 NOTE — Progress Notes (Signed)
 Spiro pre/post with DLCO performed today. See PFT for note.

## 2024-08-21 ENCOUNTER — Ambulatory Visit (INDEPENDENT_AMBULATORY_CARE_PROVIDER_SITE_OTHER): Admitting: Pulmonary Disease

## 2024-08-21 ENCOUNTER — Encounter: Payer: Self-pay | Admitting: Pulmonary Disease

## 2024-08-21 VITALS — BP 137/90 | HR 85 | Temp 98.1°F | Ht 63.0 in | Wt 177.4 lb

## 2024-08-21 DIAGNOSIS — U099 Post covid-19 condition, unspecified: Secondary | ICD-10-CM | POA: Diagnosis not present

## 2024-08-21 DIAGNOSIS — J45909 Unspecified asthma, uncomplicated: Secondary | ICD-10-CM

## 2024-08-21 DIAGNOSIS — F17210 Nicotine dependence, cigarettes, uncomplicated: Secondary | ICD-10-CM

## 2024-08-21 DIAGNOSIS — I10 Essential (primary) hypertension: Secondary | ICD-10-CM | POA: Diagnosis not present

## 2024-08-21 DIAGNOSIS — J8410 Pulmonary fibrosis, unspecified: Secondary | ICD-10-CM | POA: Diagnosis not present

## 2024-08-21 DIAGNOSIS — G4733 Obstructive sleep apnea (adult) (pediatric): Secondary | ICD-10-CM | POA: Diagnosis not present

## 2024-08-21 DIAGNOSIS — J849 Interstitial pulmonary disease, unspecified: Secondary | ICD-10-CM

## 2024-08-21 DIAGNOSIS — J9610 Chronic respiratory failure, unspecified whether with hypoxia or hypercapnia: Secondary | ICD-10-CM

## 2024-08-21 NOTE — Progress Notes (Signed)
 "              Tiffany Coleman    [id number]    [birth year]  Primary Care Physician:Avbuere, Aliene, MD  Referring Physician: Shelda Aliene, MD [address] [place],  [place] [address]  Chief complaint:   In for follow-up (lung disease  HPI:  Follow-up for interstitial lung disease Shortness of breath on exertion  She does use oxygen sometimes with activity and also at night We had discussed about getting an overnight oximetry to assess whether she still requires oxygen supplementation at night, she does have the device but has not done the study yet  She was diagnosed with COVID in 2022, developed post COVID fibrosis At some point was using azathioprine , has not been on azathioprine  now for about 6 months  She feels her breathing is relatively steady at present  No significant shortness of breath at rest, no fevers, no chills no significant sputum production  She does have a past history of sleep disordered breathing, did not tolerate CPAP in the past and does not feel interested in being evaluated for sleep disordered breathing at present  An active smoker she is still trying to quit   Outpatient Encounter Medications as of 08/21/2024  Medication Sig   ACCU-CHEK GUIDE TEST test strip daily.   Accu-Chek Softclix Lancets lancets daily.   albuterol  (VENTOLIN  HFA) 108 (90 Base) MCG/ACT inhaler Inhale 2 puffs into the lungs every 6 (six) hours as needed for wheezing or shortness of breath.   azaTHIOprine  (IMURAN ) 50 MG tablet TAKE 3 TABLETS(150 MG) BY MOUTH DAILY   brimonidine (ALPHAGAN P) 0.1 % SOLN    fluticasone  (FLONASE ) 50 MCG/ACT nasal spray Place 2 sprays into both nostrils daily.   furosemide  (LASIX ) 20 MG tablet Take 1 tablet (20 mg total) by mouth 2 (two) times daily. NEEDS FOLLOW UP APPOINTMENT FOR MORE REFILLS   guaiFENesin -dextromethorphan  (ROBITUSSIN DM) 100-10 MG/5ML syrup Take 5 mLs by mouth every 4 (four) hours as needed for cough.   hydroquinone  4 % cream  Apply topically 2 (two) times daily.   hydrOXYzine (ATARAX) 25 MG tablet Take 25 mg by mouth 2 (two) times daily as needed. for anxiety   ibuprofen  (ADVIL ) 800 MG tablet TAKE 1 TABLET(800 MG) BY MOUTH EVERY 6 HOURS AS NEEDED   ipratropium-albuterol  (DUONEB) 0.5-2.5 (3) MG/3ML SOLN Take 3 mLs by nebulization every 6 (six) hours as needed.   latanoprost (XALATAN) 0.005 % ophthalmic solution Place 1 drop into both eyes every evening.   LINZESS 290 MCG CAPS capsule Take 290 mcg by mouth every morning.   metroNIDAZOLE  (METROCREAM ) 0.75 % cream Apply topically 2 (two) times daily.   mupirocin  ointment (BACTROBAN ) 2 % Apply 1 Application topically daily.   NOREL AD 4-10-325 MG TABS Take 1 tablet by mouth 2 (two) times daily as needed.   OXYGEN Inhale 2 L into the lungs continuous.   pantoprazole  (PROTONIX ) 40 MG tablet Take 40 mg by mouth every morning.   rosuvastatin (CRESTOR) 10 MG tablet Take 1 tablet by mouth daily.   tirzepatide (MOUNJARO) 5 MG/0.5ML Pen Inject 5 mg into the skin once a week. Tuesdays   zolpidem  (AMBIEN ) 10 MG tablet Take 10 mg by mouth at bedtime as needed for sleep.   [DISCONTINUED] levofloxacin (LEVAQUIN) 500 MG tablet Take 500 mg by mouth daily.   [DISCONTINUED] ibuprofen  (ADVIL ) 800 MG tablet Take 1 tablet (800 mg total) by mouth 3 (three) times daily.   No facility-administered encounter medications on  file as of 08/21/2024.    Allergies as of 08/21/2024 - Review Complete 08/21/2024  Allergen Reaction Noted   Latex Hives and Itching 08/18/2011   Other Itching 05/16/2021   Tape Hives, Itching, and Other (See Comments) 10/12/2016    Past Medical History:  Diagnosis Date   Anemia yrs ago   Anxiety    Arthritis    knees   Bronchitis    hx   CHF (congestive heart failure) (HCC)    Complication of anesthesia    trouble breathing when wake up needs breathing tx when wakes up   DM (diabetes mellitus), type 2 (HCC)    GERD (gastroesophageal reflux disease)    Hallux  rigidus, right foot    History of COVID-19    Hx of colonic polyps 2015   Interstitial lung disease (HCC)    Morbid obesity (HCC)    Obstructive sleep apnea 06/02/2016   No CPAP used   Oxygen dependent    2 L [place]   Pneumonitis    due to covid in 2021   Recurrent ventral hernia 09/08/2013   Umbilical hernia     Past Surgical History:  Procedure Laterality Date   ABDOMINAL HYSTERECTOMY     partial   BREAST SURGERY Bilateral    cyst removed   COLON SURGERY     COLONOSCOPY WITH PROPOFOL  N/A 05/29/2017   Procedure: COLONOSCOPY WITH PROPOFOL ;  Surgeon: Dianna Specking, MD;  Location: [facility];  Service: Endoscopy;  Laterality: N/A;   COLONOSCOPY WITH PROPOFOL  N/A 05/22/2023   Procedure: COLONOSCOPY WITH PROPOFOL ;  Surgeon: Dianna Specking, MD;  Location: [facility];  Service: Gastroenterology;  Laterality: N/A;   FINGER SURGERY     right pinkie x 3   HALLUX VALGUS CHEILECTOMY Right 12/24/2023   Procedure: RIGHT FIRST METATARSOPHALANGEAL JOINT ARTHROPLASTY WITH SILASTIC IMPLANT;  Surgeon: Tobie Franky SQUIBB, DPM;  Location: [facility];  Service: Orthopedics/Podiatry;  Laterality: Right;  KELLER BUNION IMPLANT   HERNIA REPAIR     KNEE SURGERY Right 2017   PARTIAL COLECTOMY Left ~2004   for obstruction   POLYPECTOMY  05/22/2023   Procedure: POLYPECTOMY;  Surgeon: Dianna Specking, MD;  Location: [facility];  Service: Gastroenterology;;   right foot surgery  yrs ago   RIGHT HEART CATH N/A 05/20/2021   Procedure: RIGHT HEART CATH;  Surgeon: Rolan Ezra RAMAN, MD;  Location: [facility];  Service: Cardiovascular;  Laterality: N/A;   SHOULDER ARTHROSCOPY Right    stomach tumur     [id number] benign    Family History  Problem Relation Age of Onset   Deep vein thrombosis Mother    Diabetes Mother    Hypertension Mother    Varicose Veins Mother    Cancer Father        colon   Hypertension Sister    Hyperlipidemia Brother    Hypertension Daughter    Heart attack  Son    Cancer Maternal Uncle     Social History   Socioeconomic History   Marital status: Single    Spouse name: Not on file   Number of children: Not on file   Years of education: Not on file   Highest education level: 11th grade  Occupational History   Not on file  Tobacco Use   Smoking status: Some Days    Current packs/day: 0.00    Types: Cigarettes    Start date: 05/26/2000    Last attempt to quit: 05/26/2020    Years since quitting: 4.2  Passive exposure: Past   Smokeless tobacco: Never   Tobacco comments:    5 cigarttes a week 08/22/2023 KRD  Vaping Use   Vaping status: Never Used  Substance and Sexual Activity   Alcohol use: Yes    Comment: wine coolers occ   Drug use: No   Sexual activity: Not Currently    Birth control/protection: None  Other Topics Concern   Not on file  Social History Narrative   Not on file   Social Drivers of Health   Tobacco Use: High Risk (08/21/2024)   Patient History    Smoking Tobacco Use: Some Days    Smokeless Tobacco Use: Never    Passive Exposure: Past  Financial Resource Strain: Not on file  Food Insecurity: Low Risk (12/28/2022)   Received from [facility]   Epic    Within the past 12 months, you worried that your food would run out before you got money to buy more: Never true    Within the past 12 months, the food you bought just didn't last and you didn't have money to get more. : Never true  Transportation Needs: Not on file (12/28/2022)  Recent Concern: Transportation Needs - Unmet Transportation Needs (12/28/2022)   Received from [facility]    In the past 12 months, has lack of reliable transportation kept you from medical appointments, meetings, work or from getting things needed for daily living? : Yes  Physical Activity: Not on file  Stress: Not on file  Social Connections: Not on file  Intimate Partner Violence: Not on file  Depression (PHQ2-9): Low Risk (09/28/2023)   Depression (PHQ2-9)     PHQ-2 Score: 3  Alcohol Screen: Not on file  Housing: Low Risk (12/28/2022)   Received from [facility]   Epic    What is your living situation today?: I have a steady place to live    Think about the place you live. Do you have problems with any of the following? Choose all that apply:: Not on file  Utilities: Medium Risk (12/28/2022)   Received from [facility]   Utilities    In the past 12 months has the electric, gas, oil, or water company threatened to shut off services in your home? : Yes  Health Literacy: Not on file    Review of Systems  Respiratory:  Positive for shortness of breath.     Vitals:   08/21/24 1449  BP: (!) 137/90  Pulse: 85  Temp: 98.1 F (36.7 C)  SpO2: 96%     Physical Exam Constitutional:      Appearance: She is obese.  HENT:     Head: Normocephalic.     Nose: Nose normal.     Mouth/Throat:     Mouth: Mucous membranes are moist.  Eyes:     General: No scleral icterus. Cardiovascular:     Rate and Rhythm: Normal rate and regular rhythm.     Heart sounds: No murmur heard.    No friction rub.  Pulmonary:     Effort: No respiratory distress.     Breath sounds: No stridor. No wheezing or rhonchi.  Musculoskeletal:     Cervical back: No rigidity or tenderness.  Neurological:     Mental Status: She is alert.  Psychiatric:        Mood and Affect: Mood normal.    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  Chronic respiratory failure - Need for oxygen use at night - She does have the oximeter device to obtain an overnight oximetry - Encouraged her to follow through with this  Smoking cessation counseling  Encouraged to stay active and exercise regularly  Obstructive sleep apnea - Not interested in further  evaluation  Hypertension is well-controlled  History of asthma - Well-controlled  Regarding azathioprine  I think this can be held off on at the present time Will repeat PFT again in about 6 months for trend  -There is no added benefit to resuming azathioprine  at the present time as I do not believe she needs ongoing immunosuppression  Will follow-up with PFTs, CT scan as indicated if any worsening symptoms  I personally spent a total of 35 minutes in the care of the patient today including preparing to see the patient, getting/reviewing separately obtained history, performing a medically appropriate exam/evaluation, counseling and educating, placing orders, documenting clinical information in the EHR, and independently interpreting results. Due to    Jennet Epley MD [facility] 08/21/2024, 4:55 PM  CC: Shelda Atlas, MD   "

## 2024-08-21 NOTE — Patient Instructions (Signed)
 Repeat breathing study in 6 months-this is to keep an eye on your lung function  You do not need to go back on the azathioprine   CT scan is stable Your lung function is better compared to the last 1  Make sure you get the overnight oximetry study done-you should have it done without oxygen in place  Call us  with significant concerns

## 2025-02-10 ENCOUNTER — Ambulatory Visit: Admitting: Pulmonary Disease
